# Patient Record
Sex: Male | Born: 1945
Health system: Southern US, Community
[De-identification: ages and names within clinical notes are randomized; demographics above are authoritative.]

## PROBLEM LIST (undated history)

## (undated) DIAGNOSIS — F419 Anxiety disorder, unspecified: Secondary | ICD-10-CM

## (undated) DIAGNOSIS — N189 Chronic kidney disease, unspecified: Secondary | ICD-10-CM

## (undated) DIAGNOSIS — J189 Pneumonia, unspecified organism: Secondary | ICD-10-CM

## (undated) DIAGNOSIS — Z87442 Personal history of urinary calculi: Secondary | ICD-10-CM

## (undated) DIAGNOSIS — C801 Malignant (primary) neoplasm, unspecified: Secondary | ICD-10-CM

## (undated) DIAGNOSIS — I509 Heart failure, unspecified: Secondary | ICD-10-CM

## (undated) DIAGNOSIS — I10 Essential (primary) hypertension: Secondary | ICD-10-CM

## (undated) DIAGNOSIS — J449 Chronic obstructive pulmonary disease, unspecified: Secondary | ICD-10-CM

## (undated) DIAGNOSIS — I499 Cardiac arrhythmia, unspecified: Secondary | ICD-10-CM

## (undated) DIAGNOSIS — N200 Calculus of kidney: Secondary | ICD-10-CM

## (undated) DIAGNOSIS — M1990 Unspecified osteoarthritis, unspecified site: Secondary | ICD-10-CM

## (undated) DIAGNOSIS — IMO0001 Reserved for inherently not codable concepts without codable children: Secondary | ICD-10-CM

## (undated) HISTORY — PX: PROSTATECTOMY: SHX69

---

## 2016-06-27 ENCOUNTER — Emergency Department: Payer: Medicare Other

## 2016-06-27 ENCOUNTER — Encounter: Payer: Self-pay | Admitting: Emergency Medicine

## 2016-06-27 ENCOUNTER — Inpatient Hospital Stay
Admission: EM | Admit: 2016-06-27 | Discharge: 2016-07-04 | DRG: 871 | Disposition: A | Discharge status: Skilled Nursing Facility | Payer: Medicare Other | Attending: Internal Medicine | Admitting: Internal Medicine

## 2016-06-27 DIAGNOSIS — J9601 Acute respiratory failure with hypoxia: Secondary | ICD-10-CM | POA: Diagnosis present

## 2016-06-27 DIAGNOSIS — N136 Pyonephrosis: Secondary | ICD-10-CM | POA: Diagnosis present

## 2016-06-27 DIAGNOSIS — N12 Tubulo-interstitial nephritis, not specified as acute or chronic: Secondary | ICD-10-CM | POA: Diagnosis present

## 2016-06-27 DIAGNOSIS — J440 Chronic obstructive pulmonary disease with acute lower respiratory infection: Secondary | ICD-10-CM | POA: Diagnosis present

## 2016-06-27 DIAGNOSIS — D61818 Other pancytopenia: Secondary | ICD-10-CM | POA: Diagnosis present

## 2016-06-27 DIAGNOSIS — J189 Pneumonia, unspecified organism: Secondary | ICD-10-CM | POA: Diagnosis present

## 2016-06-27 DIAGNOSIS — F329 Major depressive disorder, single episode, unspecified: Secondary | ICD-10-CM | POA: Diagnosis present

## 2016-06-27 DIAGNOSIS — Z8546 Personal history of malignant neoplasm of prostate: Secondary | ICD-10-CM

## 2016-06-27 DIAGNOSIS — R52 Pain, unspecified: Secondary | ICD-10-CM

## 2016-06-27 DIAGNOSIS — Z7951 Long term (current) use of inhaled steroids: Secondary | ICD-10-CM

## 2016-06-27 DIAGNOSIS — N179 Acute kidney failure, unspecified: Secondary | ICD-10-CM | POA: Diagnosis present

## 2016-06-27 DIAGNOSIS — R0602 Shortness of breath: Secondary | ICD-10-CM

## 2016-06-27 DIAGNOSIS — E871 Hypo-osmolality and hyponatremia: Secondary | ICD-10-CM | POA: Diagnosis present

## 2016-06-27 DIAGNOSIS — A4159 Other Gram-negative sepsis: Principal | ICD-10-CM | POA: Diagnosis present

## 2016-06-27 DIAGNOSIS — J441 Chronic obstructive pulmonary disease with (acute) exacerbation: Secondary | ICD-10-CM | POA: Diagnosis present

## 2016-06-27 DIAGNOSIS — R6520 Severe sepsis without septic shock: Secondary | ICD-10-CM | POA: Diagnosis present

## 2016-06-27 DIAGNOSIS — I10 Essential (primary) hypertension: Secondary | ICD-10-CM | POA: Diagnosis present

## 2016-06-27 DIAGNOSIS — Z87891 Personal history of nicotine dependence: Secondary | ICD-10-CM

## 2016-06-27 DIAGNOSIS — A419 Sepsis, unspecified organism: Secondary | ICD-10-CM | POA: Diagnosis present

## 2016-06-27 DIAGNOSIS — N202 Calculus of kidney with calculus of ureter: Secondary | ICD-10-CM | POA: Diagnosis present

## 2016-06-27 DIAGNOSIS — Z79899 Other long term (current) drug therapy: Secondary | ICD-10-CM

## 2016-06-27 DIAGNOSIS — N201 Calculus of ureter: Secondary | ICD-10-CM

## 2016-06-27 DIAGNOSIS — B961 Klebsiella pneumoniae [K. pneumoniae] as the cause of diseases classified elsewhere: Secondary | ICD-10-CM | POA: Diagnosis present

## 2016-06-27 DIAGNOSIS — N390 Urinary tract infection, site not specified: Secondary | ICD-10-CM | POA: Diagnosis present

## 2016-06-27 HISTORY — DX: Malignant (primary) neoplasm, unspecified: C80.1

## 2016-06-27 HISTORY — DX: Essential (primary) hypertension: I10

## 2016-06-27 HISTORY — DX: Chronic obstructive pulmonary disease, unspecified: J44.9

## 2016-06-27 LAB — URINALYSIS COMPLETE WITH MICROSCOPIC (ARMC ONLY)
Bilirubin Urine: NEGATIVE
Glucose, UA: NEGATIVE mg/dL
Ketones, ur: NEGATIVE mg/dL
Nitrite: NEGATIVE
PH: 5 (ref 5.0–8.0)
PROTEIN: 100 mg/dL — AB
SPECIFIC GRAVITY, URINE: 1.017 (ref 1.005–1.030)

## 2016-06-27 LAB — COMPREHENSIVE METABOLIC PANEL
ALBUMIN: 3.2 g/dL — AB (ref 3.5–5.0)
ALK PHOS: 94 U/L (ref 38–126)
ALT: 22 U/L (ref 17–63)
ANION GAP: 12 (ref 5–15)
AST: 50 U/L — ABNORMAL HIGH (ref 15–41)
BILIRUBIN TOTAL: 1 mg/dL (ref 0.3–1.2)
BUN: 40 mg/dL — AB (ref 6–20)
CALCIUM: 8.3 mg/dL — AB (ref 8.9–10.3)
CO2: 21 mmol/L — ABNORMAL LOW (ref 22–32)
CREATININE: 2.47 mg/dL — AB (ref 0.61–1.24)
Chloride: 97 mmol/L — ABNORMAL LOW (ref 101–111)
GFR calc Af Amer: 28 mL/min — ABNORMAL LOW (ref >60)
GFR calc non Af Amer: 24 mL/min — ABNORMAL LOW (ref >60)
GLUCOSE: 115 mg/dL — AB (ref 65–99)
Potassium: 4.1 mmol/L (ref 3.5–5.1)
Sodium: 130 mmol/L — ABNORMAL LOW (ref 135–145)
TOTAL PROTEIN: 6.8 g/dL (ref 6.5–8.1)

## 2016-06-27 LAB — CBC WITH DIFFERENTIAL/PLATELET
BASOS ABS: 0 10*3/uL (ref 0–0.1)
EOS ABS: 0 10*3/uL (ref 0–0.7)
HCT: 38.7 % — ABNORMAL LOW (ref 40.0–52.0)
Hemoglobin: 13.5 g/dL (ref 13.0–18.0)
Lymphocytes Relative: 11 %
Lymphs Abs: 0.2 10*3/uL — ABNORMAL LOW (ref 1.0–3.6)
MCH: 32.4 pg (ref 26.0–34.0)
MCHC: 35 g/dL (ref 32.0–36.0)
MCV: 92.6 fL (ref 80.0–100.0)
MONO ABS: 0 10*3/uL — AB (ref 0.2–1.0)
Monocytes Relative: 1 %
NEUTROS ABS: 2 10*3/uL (ref 1.4–6.5)
Neutrophils Relative %: 88 %
PLATELETS: 86 10*3/uL — AB (ref 150–440)
RBC: 4.18 MIL/uL — ABNORMAL LOW (ref 4.40–5.90)
RDW: 14.4 % (ref 11.5–14.5)
WBC: 2.2 10*3/uL — ABNORMAL LOW (ref 3.8–10.6)

## 2016-06-27 LAB — BRAIN NATRIURETIC PEPTIDE: B NATRIURETIC PEPTIDE 5: 239 pg/mL — AB (ref 0.0–100.0)

## 2016-06-27 MED ORDER — SODIUM CHLORIDE 0.9 % IV BOLUS (SEPSIS)
1000.0000 mL | Freq: Once | INTRAVENOUS | Status: AC
  Administered 2016-06-27: 1000 mL via INTRAVENOUS

## 2016-06-27 MED ORDER — MORPHINE SULFATE (PF) 4 MG/ML IV SOLN
INTRAVENOUS | Status: AC
  Administered 2016-06-27: 4 mg via INTRAVENOUS
  Filled 2016-06-27: qty 1

## 2016-06-27 MED ORDER — ONDANSETRON HCL 4 MG/2ML IJ SOLN
4.0000 mg | Freq: Once | INTRAMUSCULAR | Status: AC
  Administered 2016-06-27: 4 mg via INTRAVENOUS

## 2016-06-27 MED ORDER — SODIUM CHLORIDE 0.9 % IV BOLUS (SEPSIS)
1000.0000 mL | Freq: Once | INTRAVENOUS | Status: AC
Start: 2016-06-28 — End: 2016-06-28
  Administered 2016-06-27: 1000 mL via INTRAVENOUS

## 2016-06-27 MED ORDER — PIPERACILLIN-TAZOBACTAM 3.375 G IVPB 30 MIN
3.3750 g | Freq: Once | INTRAVENOUS | Status: AC
  Administered 2016-06-27: 3.375 g via INTRAVENOUS
  Filled 2016-06-27: qty 50

## 2016-06-27 MED ORDER — MORPHINE SULFATE (PF) 4 MG/ML IV SOLN
4.0000 mg | Freq: Once | INTRAVENOUS | Status: AC
  Administered 2016-06-27: 4 mg via INTRAVENOUS

## 2016-06-27 MED ORDER — ONDANSETRON HCL 4 MG/2ML IJ SOLN
INTRAMUSCULAR | Status: AC
  Administered 2016-06-27: 4 mg via INTRAVENOUS
  Filled 2016-06-27: qty 2

## 2016-06-27 NOTE — ED Triage Notes (Signed)
Pt to ED via EMS from home c/o respiratory distress that started several days ago and worsened today.  EMS states 75% RA upon arrival, wheezing throughout lungs, gave '10mg'$  albuterol, 1 duoneb, '125mg'$  solumedrol, then placed on 2L [place] with 96% oxygen saturation.  Pt has hx of COPD.  States recently dx with kidney stone and placed on flomax, IBU, and tylenol.  Pt presents with rhonchi throughout, tachypnic, labored breathing, pale, and clammy skin, A&Ox4, speaking in complete and coherent sentences.

## 2016-06-27 NOTE — ED Notes (Addendum)
MD Malinda at bedside. 

## 2016-06-27 NOTE — ED Notes (Signed)
Pt placed on bi-pap by respiratory.  Pt reports easier time breathing.

## 2016-06-27 NOTE — ED Provider Notes (Signed)
[facility] Emergency Department Provider Note   ____________________________________________   First MD Initiated Contact with Patient 06/27/16 2120     (approximate)  I have reviewed the triage vital signs and the nursing notes.   HISTORY  Chief Complaint Respiratory Distress    HPI Edwin Donovan. is a 70 y.o. male patient reports she has a kidney stone and was seen for this and started on Flomax treatment. He has become very short of breath got very sweaty shaky and felt a little woozy and very weak. This seems to develop rapidly today.   Past Medical History:  Diagnosis Date  . COPD (chronic obstructive pulmonary disease) ([place])   . Hypertension    Past medical history kidney stone There are no active problems to display for this patient.   Past Surgical History:  Procedure Laterality Date  . PROSTATECTOMY      Prior to Admission medications   Medication Sig Start Date End Date Taking? Authorizing Provider  albuterol (PROVENTIL HFA;VENTOLIN HFA) 108 (90 Base) MCG/ACT inhaler Inhale 1-2 puffs into the lungs every 6 (six) hours as needed for wheezing or shortness of breath.   Yes Historical Provider, MD  amLODipine (NORVASC) 10 MG tablet Take 10 mg by mouth daily.   Yes Historical Provider, MD  budesonide-formoterol (SYMBICORT) 160-4.5 MCG/ACT inhaler Inhale 2 puffs into the lungs 2 (two) times daily.   Yes Historical Provider, MD  FLUoxetine (PROZAC) 20 MG capsule Take 40 mg by mouth daily.   Yes Historical Provider, MD  hydrochlorothiazide (HYDRODIURIL) 25 MG tablet Take 25 mg by mouth daily.   Yes Historical Provider, MD  ibuprofen (ADVIL,MOTRIN) 600 MG tablet Take 600 mg by mouth every 6 (six) hours as needed for mild pain or moderate pain.   Yes Historical Provider, MD  tamsulosin (FLOMAX) 0.4 MG CAPS capsule Take 0.4 mg by mouth daily.   Yes Historical Provider, MD    Allergies Review of patient's allergies indicates no  known allergies.  History reviewed. No pertinent family history.  Social History Social History  Substance Use Topics  . Smoking status: Former Research scientist (life sciences)  . Smokeless tobacco: Never Used  . Alcohol use No    Review of Systems Constitutional: chillsAnd sweats Eyes: No visual changes. ENT: No sore throat. Cardiovascular: Denies chest pain. Respiratory: shortness of breath. Gastrointestinal: No abdominal pain.  No nausea, no vomiting.  No diarrhea.  No constipation. Genitourinary: Negative for dysuria. Musculoskeletal: Negative for back pain. Skin: Negative for rash. Neurological: Negative for headaches, focal weakness or numbness.  10-point ROS otherwise negative.  ____________________________________________   PHYSICAL EXAM:  VITAL SIGNS: ED Triage Vitals [06/27/16 2115]  Enc Vitals Group     BP 114/70     Pulse Rate (!) 132     Resp (!) 38     Temp 98.5 F (36.9 C)     Temp Source Oral     SpO2 92 %     Weight      Height      Head Circumference      Peak Flow      Pain Score      Pain Loc      Pain Edu?      Excl. in [place]?     Constitutional: Alert and oriented. Breathing hard Eyes: Conjunctivae are normal. PERRL. EOMI. Head: Atraumatic. Nose: No congestion/rhinnorhea. Mouth/Throat: Mucous membranes are moist.  Oropharynx non-erythematous. Neck: No stridor.  Cardiovascular: Increased rate, regular rhythm. Grossly normal heart sounds.  Good peripheral circulation. Respiratory: Increased respiratory effort.  No retractions. Lungs CTAB. Gastrointestinal: Soft and nontender. No distention. No abdominal bruits. No CVA tenderness. Musculoskeletal: No lower extremity tenderness nor edema.  No joint effusions. Neurologic:  Normal speech and language. No gross focal neurologic deficits are appreciated. No gait instability. Skin:  Skin is warm, dry and intact. No rash noted. Psychiatric: Mood and affect are normal. Speech and behavior are  normal.  ____________________________________________   LABS (all labs ordered are listed, but only abnormal results are displayed)  Labs Reviewed  COMPREHENSIVE METABOLIC PANEL - Abnormal; Notable for the following:       Result Value   Sodium 130 (*)    Chloride 97 (*)    CO2 21 (*)    Glucose, Bld 115 (*)    BUN 40 (*)    Creatinine, Ser 2.47 (*)    Calcium 8.3 (*)    Albumin 3.2 (*)    AST 50 (*)    GFR calc non Af Amer 24 (*)    GFR calc Af Amer 28 (*)    All other components within normal limits  CBC WITH DIFFERENTIAL/PLATELET - Abnormal; Notable for the following:    WBC 2.2 (*)    RBC 4.18 (*)    HCT 38.7 (*)    Platelets 86 (*)    Lymphs Abs 0.2 (*)    Monocytes Absolute 0.0 (*)    All other components within normal limits  BLOOD GAS, ARTERIAL - Abnormal; Notable for the following:    pH, Arterial 7.49 (*)    pCO2 arterial 27 (*)    pO2, Arterial 129 (*)    Bicarbonate 20.6 (*)    All other components within normal limits  URINALYSIS COMPLETEWITH MICROSCOPIC ([facility] ONLY) - Abnormal; Notable for the following:    Color, Urine AMBER (*)    APPearance CLOUDY (*)    Hgb urine dipstick 3+ (*)    Protein, ur 100 (*)    Leukocytes, UA TRACE (*)    Bacteria, UA MANY (*)    Squamous Epithelial / LPF 0-5 (*)    All other components within normal limits  BRAIN NATRIURETIC PEPTIDE - Abnormal; Notable for the following:    B Natriuretic Peptide 239.0 (*)    All other components within normal limits  CULTURE, BLOOD (ROUTINE X 2)  CULTURE, BLOOD (ROUTINE X 2)  URINE CULTURE  LACTIC ACID, PLASMA  LACTIC ACID, PLASMA   ____________________________________________  EKG  EKG read and interpreted by me shows sinus tachycardia rate of 1:30 normal axis no acute ST-T wave changes ____________________________________________  RADIOLOGY  EXAM: CT ABDOMEN AND PELVIS WITHOUT CONTRAST  TECHNIQUE: Multidetector CT imaging of the abdomen and pelvis was  performed following the standard protocol without IV contrast.  COMPARISON:  None.  FINDINGS: Please note that parenchymal abnormalities may be missed without intravenous contrast.  Lower chest:  Coronary artery calcifications noted.  Hepatobiliary: The liver and gallbladder are unremarkable. There is no evidence of biliary dilatation.  Pancreas: Unremarkable  Spleen: Unremarkable  Adrenals/Urinary Tract: A 6 mm right UVJ calculus causes moderate right hydroureteronephrosis and perinephric inflammation. A 2 mm nonobstructing distal right ureteral calculus identified approximately with 1 cm above the UVJ. Punctate nonobstructing bilateral renal calculi are identified. A nonobstructing 5 mm posteriorly right renal calculus noted. Left renal cysts are present. The adrenal glands are unremarkable.  Stomach/Bowel: Colonic diverticulosis noted without diverticulitis. There is no evidence of bowel obstruction or definite bowel wall thickening. The appendix  is normal.  Vascular/Lymphatic: Abdominal aortic atherosclerotic calcifications noted. The distal aorta measures 3.1 cm in greatest diameter. No enlarged lymph nodes identified.  Reproductive: Prostatectomy changes identified.  Other: No free fluid, focal collection or pneumoperitoneum.  Musculoskeletal: No acute or suspicious abnormalities. Degenerative changes within the lumbar spine noted.  IMPRESSION: 6 mm right UVJ calculus causing moderate right hydroureteronephrosis.  2 mm nonobstructing distal right ureteral calculus, approximately 1 cm above the right UVJ.  Bilateral nonobstructing renal calculi.  3.1 cm distal abdominal aortic aneurysm. Recommend followup by ultrasound in 3 years. This recommendation follows ACR consensus guidelines: White Paper of the ACR Incidental Findings Committee II on Vascular Findings. J Am Coll Radiol 2013; 71:062-694  Abdominal aortic atherosclerosis.  Coronary  artery disease.   Electronically Signed   By: Margarette Canada M.D.   On: 06/27/2016 22:42  PORTABLE CHEST 1 VIEW  COMPARISON:  None.  FINDINGS: The heart size and mediastinal contours are within normal limits. Both lungs are clear. The visualized skeletal structures are unremarkable.  IMPRESSION: No active disease.   Electronically Signed   By: Fidela Salisbury M.D.   On: 06/27/2016 22:06  ____________________________________________   PROCEDURES  Procedure(s) performed Procedures  Critical Care performed: CRITICAL CARE Performed by: Nena Polio   Total critical care time: 45 minutes  Critical care time was exclusive of separately billable procedures and treating other patients.  Critical care was necessary to treat or prevent imminent or life-threatening deterioration.  Critical care was time spent personally by me on the following activities: development of treatment plan with patient and/or surrogate as well as nursing, discussions with consultants, evaluation of patient's response to treatment, examination of patient, obtaining history from patient or surrogate, ordering and performing treatments and interventions, ordering and review of laboratory studies, ordering and review of radiographic studies, pulse oximetry and re-evaluation of patient's condition.   ____________________________________________   INITIAL IMPRESSION / ASSESSMENT AND PLAN / ED COURSE  Pertinent labs & imaging results that were available during my care of the patient were reviewed by me and considered in my medical decision making (see chart for details).    Clinical Course   Discussed patient with urology who does not feel the hypoxia would cause his problems does not think he is sepsis septic from his stone and will get back to [place] if we asked them to again.  [facility]  I'm not sure why he was hypoxic he has no chest pain. I cannot  CT him because of his creatinine being so high at this point. __________________________________________   FINAL CLINICAL IMPRESSION(S) / ED DIAGNOSES  Final diagnoses:  Pain  Sepsis, due to unspecified organism [facility])   Other diagnosis hypoxia Now hypotension   NEW MEDICATIONS STARTED DURING THIS VISIT:  New Prescriptions   No medications on file     Note:  This document was prepared using Dragon voice recognition software and may include unintentional dictation errors.    Nena Polio, MD 06/28/16 [phone]

## 2016-06-27 NOTE — ED Notes (Signed)
MD [facility] informed of pt's BP

## 2016-06-28 ENCOUNTER — Encounter
Admission: EM | Disposition: A | Discharge status: Skilled Nursing Facility | Payer: Self-pay | Source: Home / Self Care | Attending: Internal Medicine

## 2016-06-28 ENCOUNTER — Inpatient Hospital Stay: Payer: Medicare Other | Admitting: Anesthesiology

## 2016-06-28 ENCOUNTER — Encounter: Payer: Self-pay | Admitting: Internal Medicine

## 2016-06-28 DIAGNOSIS — R079 Chest pain, unspecified: Secondary | ICD-10-CM | POA: Diagnosis not present

## 2016-06-28 DIAGNOSIS — I10 Essential (primary) hypertension: Secondary | ICD-10-CM | POA: Diagnosis present

## 2016-06-28 DIAGNOSIS — D61818 Other pancytopenia: Secondary | ICD-10-CM | POA: Diagnosis present

## 2016-06-28 DIAGNOSIS — J189 Pneumonia, unspecified organism: Secondary | ICD-10-CM | POA: Diagnosis present

## 2016-06-28 DIAGNOSIS — A4159 Other Gram-negative sepsis: Secondary | ICD-10-CM | POA: Diagnosis present

## 2016-06-28 DIAGNOSIS — N136 Pyonephrosis: Secondary | ICD-10-CM | POA: Diagnosis present

## 2016-06-28 DIAGNOSIS — J440 Chronic obstructive pulmonary disease with acute lower respiratory infection: Secondary | ICD-10-CM | POA: Diagnosis present

## 2016-06-28 DIAGNOSIS — N179 Acute kidney failure, unspecified: Secondary | ICD-10-CM | POA: Diagnosis present

## 2016-06-28 DIAGNOSIS — A419 Sepsis, unspecified organism: Secondary | ICD-10-CM | POA: Diagnosis present

## 2016-06-28 DIAGNOSIS — R52 Pain, unspecified: Secondary | ICD-10-CM | POA: Diagnosis present

## 2016-06-28 DIAGNOSIS — J441 Chronic obstructive pulmonary disease with (acute) exacerbation: Secondary | ICD-10-CM | POA: Diagnosis present

## 2016-06-28 DIAGNOSIS — B961 Klebsiella pneumoniae [K. pneumoniae] as the cause of diseases classified elsewhere: Secondary | ICD-10-CM | POA: Diagnosis present

## 2016-06-28 DIAGNOSIS — Z7951 Long term (current) use of inhaled steroids: Secondary | ICD-10-CM | POA: Diagnosis not present

## 2016-06-28 DIAGNOSIS — J9601 Acute respiratory failure with hypoxia: Secondary | ICD-10-CM | POA: Diagnosis present

## 2016-06-28 DIAGNOSIS — Z87891 Personal history of nicotine dependence: Secondary | ICD-10-CM | POA: Diagnosis not present

## 2016-06-28 DIAGNOSIS — R6520 Severe sepsis without septic shock: Secondary | ICD-10-CM | POA: Diagnosis present

## 2016-06-28 DIAGNOSIS — N12 Tubulo-interstitial nephritis, not specified as acute or chronic: Secondary | ICD-10-CM | POA: Diagnosis present

## 2016-06-28 DIAGNOSIS — N1330 Unspecified hydronephrosis: Secondary | ICD-10-CM

## 2016-06-28 DIAGNOSIS — N201 Calculus of ureter: Secondary | ICD-10-CM

## 2016-06-28 DIAGNOSIS — Z79899 Other long term (current) drug therapy: Secondary | ICD-10-CM | POA: Diagnosis not present

## 2016-06-28 DIAGNOSIS — N390 Urinary tract infection, site not specified: Secondary | ICD-10-CM | POA: Diagnosis present

## 2016-06-28 DIAGNOSIS — N202 Calculus of kidney with calculus of ureter: Secondary | ICD-10-CM | POA: Diagnosis present

## 2016-06-28 DIAGNOSIS — F329 Major depressive disorder, single episode, unspecified: Secondary | ICD-10-CM | POA: Diagnosis present

## 2016-06-28 DIAGNOSIS — E871 Hypo-osmolality and hyponatremia: Secondary | ICD-10-CM | POA: Diagnosis present

## 2016-06-28 DIAGNOSIS — Z8546 Personal history of malignant neoplasm of prostate: Secondary | ICD-10-CM | POA: Diagnosis not present

## 2016-06-28 HISTORY — PX: CYSTOSCOPY WITH STENT PLACEMENT: SHX5790

## 2016-06-28 LAB — BLOOD CULTURE ID PANEL (REFLEXED)
ACINETOBACTER BAUMANNII: NOT DETECTED
CANDIDA GLABRATA: NOT DETECTED
CANDIDA KRUSEI: NOT DETECTED
Candida albicans: NOT DETECTED
Candida parapsilosis: NOT DETECTED
Candida tropicalis: NOT DETECTED
Carbapenem resistance: NOT DETECTED
ENTEROBACTER CLOACAE COMPLEX: NOT DETECTED
ENTEROBACTERIACEAE SPECIES: DETECTED — AB
ESCHERICHIA COLI: NOT DETECTED
Enterococcus species: NOT DETECTED
HAEMOPHILUS INFLUENZAE: NOT DETECTED
KLEBSIELLA OXYTOCA: NOT DETECTED
Klebsiella pneumoniae: DETECTED — AB
LISTERIA MONOCYTOGENES: NOT DETECTED
METHICILLIN RESISTANCE: NOT DETECTED
NEISSERIA MENINGITIDIS: NOT DETECTED
PSEUDOMONAS AERUGINOSA: NOT DETECTED
Proteus species: NOT DETECTED
SERRATIA MARCESCENS: NOT DETECTED
STREPTOCOCCUS AGALACTIAE: NOT DETECTED
STREPTOCOCCUS PYOGENES: NOT DETECTED
STREPTOCOCCUS SPECIES: NOT DETECTED
Staphylococcus aureus (BCID): NOT DETECTED
Staphylococcus species: NOT DETECTED
Streptococcus pneumoniae: NOT DETECTED
Vancomycin resistance: NOT DETECTED

## 2016-06-28 LAB — MRSA PCR SCREENING: MRSA by PCR: NEGATIVE

## 2016-06-28 LAB — BLOOD GAS, ARTERIAL
ACID-BASE DEFICIT: 1.4 mmol/L (ref 0.0–2.0)
BICARBONATE: 20.6 meq/L — AB (ref 21.0–28.0)
EXPIRATORY PAP: 6
FIO2: 0.3
Inspiratory PAP: 16
O2 SAT: 99.2 %
PATIENT TEMPERATURE: 37
PCO2 ART: 27 mmHg — AB (ref 32.0–48.0)
PH ART: 7.49 — AB (ref 7.350–7.450)
RATE: 8 resp/min
pO2, Arterial: 129 mmHg — ABNORMAL HIGH (ref 83.0–108.0)

## 2016-06-28 LAB — HEMOGLOBIN A1C: Hgb A1c MFr Bld: 6 % (ref 4.0–6.0)

## 2016-06-28 LAB — LACTIC ACID, PLASMA
LACTIC ACID, VENOUS: 2.2 mmol/L — AB (ref 0.5–1.9)
Lactic Acid, Venous: 1.6 mmol/L (ref 0.5–1.9)

## 2016-06-28 LAB — TSH: TSH: 3.648 u[IU]/mL (ref 0.350–4.500)

## 2016-06-28 LAB — TROPONIN I: Troponin I: 0.06 ng/mL (ref <0.03)

## 2016-06-28 LAB — GLUCOSE, CAPILLARY: Glucose-Capillary: 199 mg/dL — ABNORMAL HIGH (ref 65–99)

## 2016-06-28 SURGERY — CYSTOSCOPY, WITH STENT INSERTION
Anesthesia: General | Site: Ureter | Laterality: Right | Wound class: Clean Contaminated

## 2016-06-28 MED ORDER — SODIUM CHLORIDE 0.9 % IV SOLN
INTRAVENOUS | Status: DC | PRN
  Administered 2016-06-28: 12:00:00 via INTRAVENOUS

## 2016-06-28 MED ORDER — TAMSULOSIN HCL 0.4 MG PO CAPS
0.4000 mg | ORAL_CAPSULE | Freq: Every day | ORAL | Status: DC
  Administered 2016-06-28 – 2016-07-04 (×7): 0.4 mg via ORAL
  Filled 2016-06-28 (×7): qty 1

## 2016-06-28 MED ORDER — OXYCODONE HCL 5 MG PO TABS
5.0000 mg | ORAL_TABLET | ORAL | Status: DC | PRN
  Administered 2016-06-28 – 2016-07-01 (×7): 5 mg via ORAL
  Filled 2016-06-28 (×8): qty 1

## 2016-06-28 MED ORDER — DEXTROSE 5 % IV SOLN
2.0000 g | INTRAVENOUS | Status: DC
  Administered 2016-06-28 – 2016-07-03 (×6): 2 g via INTRAVENOUS
  Filled 2016-06-28 (×7): qty 2

## 2016-06-28 MED ORDER — VANCOMYCIN HCL 10 G IV SOLR
1250.0000 mg | Freq: Once | INTRAVENOUS | Status: DC
  Filled 2016-06-28: qty 1250

## 2016-06-28 MED ORDER — SUGAMMADEX SODIUM 500 MG/5ML IV SOLN
INTRAVENOUS | Status: DC | PRN
  Administered 2016-06-28: 270 mg via INTRAVENOUS

## 2016-06-28 MED ORDER — ROCURONIUM BROMIDE 100 MG/10ML IV SOLN
INTRAVENOUS | Status: DC | PRN
  Administered 2016-06-28: 10 mg via INTRAVENOUS

## 2016-06-28 MED ORDER — HEPARIN SODIUM (PORCINE) 5000 UNIT/ML IJ SOLN
5000.0000 [IU] | Freq: Three times a day (TID) | INTRAMUSCULAR | Status: DC
  Administered 2016-06-28 – 2016-06-30 (×6): 5000 [IU] via SUBCUTANEOUS
  Filled 2016-06-28 (×6): qty 1

## 2016-06-28 MED ORDER — ONDANSETRON HCL 4 MG PO TABS: 4.0000 mg | ORAL_TABLET | Freq: Four times a day (QID) | ORAL | Status: DC | PRN

## 2016-06-28 MED ORDER — SODIUM CHLORIDE 0.9% FLUSH
3.0000 mL | Freq: Two times a day (BID) | INTRAVENOUS | Status: DC
  Administered 2016-06-28 – 2016-07-04 (×12): 3 mL via INTRAVENOUS

## 2016-06-28 MED ORDER — FLUOXETINE HCL 20 MG PO CAPS
40.0000 mg | ORAL_CAPSULE | Freq: Every day | ORAL | Status: DC
Start: 2016-06-28 — End: 2016-07-04
  Administered 2016-06-28 – 2016-07-04 (×6): 40 mg via ORAL
  Filled 2016-06-28 (×7): qty 2

## 2016-06-28 MED ORDER — IPRATROPIUM-ALBUTEROL 0.5-2.5 (3) MG/3ML IN SOLN
RESPIRATORY_TRACT | Status: AC
  Filled 2016-06-28: qty 3

## 2016-06-28 MED ORDER — FENTANYL CITRATE (PF) 100 MCG/2ML IJ SOLN: 25.0000 ug | INTRAMUSCULAR | Status: DC | PRN

## 2016-06-28 MED ORDER — ONDANSETRON HCL 4 MG/2ML IJ SOLN
INTRAMUSCULAR | Status: DC | PRN
  Administered 2016-06-28: 4 mg via INTRAVENOUS

## 2016-06-28 MED ORDER — MOMETASONE FURO-FORMOTEROL FUM 200-5 MCG/ACT IN AERO
2.0000 | INHALATION_SPRAY | Freq: Two times a day (BID) | RESPIRATORY_TRACT | Status: DC
  Administered 2016-06-28 – 2016-07-04 (×12): 2 via RESPIRATORY_TRACT
  Filled 2016-06-28: qty 8.8

## 2016-06-28 MED ORDER — OXYCODONE HCL 5 MG/5ML PO SOLN: 5.0000 mg | Freq: Once | ORAL | Status: DC | PRN

## 2016-06-28 MED ORDER — SUCCINYLCHOLINE CHLORIDE 20 MG/ML IJ SOLN
INTRAMUSCULAR | Status: DC | PRN
  Administered 2016-06-28: 140 mg via INTRAVENOUS

## 2016-06-28 MED ORDER — VASOPRESSIN 20 UNIT/ML IV SOLN
INTRAVENOUS | Status: DC | PRN
  Administered 2016-06-28: 2 [IU] via INTRAVENOUS
  Administered 2016-06-28: 1 [IU] via INTRAVENOUS

## 2016-06-28 MED ORDER — VANCOMYCIN HCL IN DEXTROSE 1-5 GM/200ML-% IV SOLN
INTRAVENOUS | Status: AC
  Filled 2016-06-28: qty 200

## 2016-06-28 MED ORDER — OXYCODONE HCL 5 MG PO TABS: 5.0000 mg | ORAL_TABLET | Freq: Once | ORAL | Status: DC | PRN

## 2016-06-28 MED ORDER — PHENYLEPHRINE HCL 10 MG/ML IJ SOLN
INTRAMUSCULAR | Status: DC | PRN
  Administered 2016-06-28: 200 ug via INTRAVENOUS
  Administered 2016-06-28 (×2): 100 ug via INTRAVENOUS
  Administered 2016-06-28: 200 ug via INTRAVENOUS
  Administered 2016-06-28: 150 ug via INTRAVENOUS

## 2016-06-28 MED ORDER — IPRATROPIUM-ALBUTEROL 0.5-2.5 (3) MG/3ML IN SOLN
3.0000 mL | Freq: Once | RESPIRATORY_TRACT | Status: DC | PRN
  Administered 2016-06-28: 3 mL via RESPIRATORY_TRACT

## 2016-06-28 MED ORDER — ACETAMINOPHEN 325 MG PO TABS
650.0000 mg | ORAL_TABLET | Freq: Four times a day (QID) | ORAL | Status: DC | PRN
  Administered 2016-07-02: 650 mg via ORAL
  Filled 2016-06-28: qty 2

## 2016-06-28 MED ORDER — MORPHINE SULFATE (PF) 2 MG/ML IV SOLN
INTRAVENOUS | Status: AC
  Filled 2016-06-28: qty 1

## 2016-06-28 MED ORDER — VANCOMYCIN HCL IN DEXTROSE 1-5 GM/200ML-% IV SOLN
1000.0000 mg | Freq: Once | INTRAVENOUS | Status: AC
  Administered 2016-06-28: 1000 mg via INTRAVENOUS

## 2016-06-28 MED ORDER — PROPOFOL 10 MG/ML IV BOLUS
INTRAVENOUS | Status: DC | PRN
  Administered 2016-06-28: 180 mg via INTRAVENOUS

## 2016-06-28 MED ORDER — FENTANYL CITRATE (PF) 100 MCG/2ML IJ SOLN
INTRAMUSCULAR | Status: DC | PRN
  Administered 2016-06-28: 100 ug via INTRAVENOUS

## 2016-06-28 MED ORDER — MORPHINE SULFATE (PF) 2 MG/ML IV SOLN
1.0000 mg | INTRAVENOUS | Status: DC | PRN
  Administered 2016-06-29: 1 mg via INTRAVENOUS
  Filled 2016-06-28: qty 1

## 2016-06-28 MED ORDER — DOCUSATE SODIUM 100 MG PO CAPS
100.0000 mg | ORAL_CAPSULE | Freq: Two times a day (BID) | ORAL | Status: DC
  Administered 2016-06-28 – 2016-07-04 (×9): 100 mg via ORAL
  Filled 2016-06-28 (×13): qty 1

## 2016-06-28 MED ORDER — ACETAMINOPHEN 650 MG RE SUPP: 650.0000 mg | Freq: Four times a day (QID) | RECTAL | Status: DC | PRN

## 2016-06-28 MED ORDER — SODIUM CHLORIDE 0.9 % IV SOLN
INTRAVENOUS | Status: DC
  Administered 2016-06-28 – 2016-06-30 (×5): via INTRAVENOUS

## 2016-06-28 MED ORDER — ALBUTEROL SULFATE (2.5 MG/3ML) 0.083% IN NEBU
3.0000 mL | INHALATION_SOLUTION | Freq: Four times a day (QID) | RESPIRATORY_TRACT | Status: DC | PRN
  Administered 2016-06-29: 3 mL via RESPIRATORY_TRACT
  Filled 2016-06-28: qty 3

## 2016-06-28 MED ORDER — MORPHINE SULFATE (PF) 4 MG/ML IV SOLN: 4.0000 mg | INTRAVENOUS | Status: DC | PRN

## 2016-06-28 MED ORDER — ONDANSETRON HCL 4 MG/2ML IJ SOLN
4.0000 mg | Freq: Four times a day (QID) | INTRAMUSCULAR | Status: DC | PRN
  Administered 2016-07-01: 4 mg via INTRAVENOUS
  Filled 2016-06-28: qty 2

## 2016-06-28 SURGICAL SUPPLY — 21 items
BAG DRAIN CYSTO-URO LG1000N (MISCELLANEOUS) ×3 IMPLANT
CATH FOL 2WAY LX 16X5 (CATHETERS) IMPLANT
CATH URETL 5X70 OPEN END (CATHETERS) ×3 IMPLANT
CONRAY 43 FOR UROLOGY 50M (MISCELLANEOUS) ×3 IMPLANT
GLOVE BIO SURGEON STRL SZ 6.5 (GLOVE) ×2 IMPLANT
GLOVE BIO SURGEONS STRL SZ 6.5 (GLOVE) ×1
GOWN STRL REUS W/ TWL LRG LVL4 (GOWN DISPOSABLE) ×2 IMPLANT
GOWN STRL REUS W/TWL LRG LVL4 (GOWN DISPOSABLE) ×4
HOLDER FOLEY CATH W/STRAP (MISCELLANEOUS) IMPLANT
KIT RM TURNOVER CYSTO AR (KITS) ×3 IMPLANT
PACK CYSTO AR (MISCELLANEOUS) ×3 IMPLANT
SENSORWIRE 0.038 NOT ANGLED (WIRE) ×3
SET CYSTO W/LG BORE CLAMP LF (SET/KITS/TRAYS/PACK) ×3 IMPLANT
SOL .9 NS 3000ML IRR  AL (IV SOLUTION) ×2
SOL .9 NS 3000ML IRR UROMATIC (IV SOLUTION) ×1 IMPLANT
STENT URET 6FRX24 CONTOUR (STENTS) IMPLANT
STENT URET 6FRX26 CONTOUR (STENTS) ×3 IMPLANT
SURGILUBE 2OZ TUBE FLIPTOP (MISCELLANEOUS) ×3 IMPLANT
SYRINGE IRR TOOMEY STRL 70CC (SYRINGE) ×3 IMPLANT
WATER STERILE IRR 1000ML POUR (IV SOLUTION) ×3 IMPLANT
WIRE SENSOR 0.038 NOT ANGLED (WIRE) ×1 IMPLANT

## 2016-06-28 NOTE — Progress Notes (Addendum)
[employer] PROGRESS NOTE  Edwin Donovan. XLK:[id number] DOB: [birth year] DOA: 06/27/2016 PCP: [place]  HPI/Subjective: Patient feeling much better than he did earlier. Still having some pain in his groin. Admitted with sepsis tachycardia and tachypnea  Objective: Vitals:   06/28/16 1500 06/28/16 1600  BP: 95/70 101/76  Pulse: 87 92  Resp: 16 17  Temp:      Filed Weights   06/27/16 2129 06/28/16 0339 06/28/16 1147  Weight: 129.3 kg (285 lb) 135.4 kg (298 lb 8.1 oz) 135.2 kg (298 lb)    ROS: Review of Systems  Constitutional: Negative for chills and fever.  Eyes: Negative for blurred vision.  Respiratory: Positive for shortness of breath. Negative for cough.   Cardiovascular: Negative for chest pain.  Gastrointestinal: Positive for abdominal pain. Negative for constipation, diarrhea, nausea and vomiting.  Genitourinary: Negative for dysuria.  Musculoskeletal: Negative for joint pain.  Neurological: Negative for dizziness and headaches.   Exam: Physical Exam  Constitutional: He is oriented to person, place, and time.  HENT:  Nose: No mucosal edema.  Mouth/Throat: No oropharyngeal exudate or posterior oropharyngeal edema.  Eyes: Conjunctivae, EOM and lids are normal. Pupils are equal, round, and reactive to light.  Neck: No JVD present. Carotid bruit is not present. No edema present. No thyroid mass and no thyromegaly present.  Cardiovascular: S1 normal and S2 normal.  Exam reveals no gallop.   No murmur heard. Pulses:      Dorsalis pedis pulses are 2+ on the right side, and 2+ on the left side.  Respiratory: No respiratory distress. He has no wheezes. He has no rhonchi. He has rales in the right lower field and the left lower field.  GI: Soft. Bowel sounds are normal. There is tenderness in the right lower quadrant.  Musculoskeletal:       Right ankle: He exhibits swelling.       Left ankle: He exhibits swelling.  Lymphadenopathy:    He  has no cervical adenopathy.  Neurological: He is alert and oriented to person, place, and time. No cranial nerve deficit.  Skin: Skin is warm. No rash noted. Nails show no clubbing.  Psychiatric: He has a normal mood and affect.      Data Reviewed: Basic Metabolic Panel:  Recent Labs Lab 06/27/16 2122  NA 130*  K 4.1  CL 97*  CO2 21*  GLUCOSE 115*  BUN 40*  CREATININE 2.47*  CALCIUM 8.3*   Liver Function Tests:  Recent Labs Lab 06/27/16 2122  AST 50*  ALT 22  ALKPHOS 94  BILITOT 1.0  PROT 6.8  ALBUMIN 3.2*   CBC:  Recent Labs Lab 06/27/16 2122  WBC 2.2*  NEUTROABS 2.0  HGB 13.5  HCT 38.7*  MCV 92.6  PLT 86*   Cardiac Enzymes:  Recent Labs Lab 06/28/16 0245  TROPONINI 0.06*   BNP (last 3 results)  Recent Labs  06/27/16 2122  BNP 239.0*     CBG:  Recent Labs Lab 06/28/16 0316  GLUCAP 199*    Recent Results (from the past 240 hour(s))  Blood Culture (routine x 2)     Status: None (Preliminary result)   Collection Time: 06/27/16  9:22 PM  Result Value Ref Range Status   Specimen Description   Final    BLOOD RIGHT ANTECUBITAL Performed at [facility]  Final   Culture  Setup Time   Final  GRAM NEGATIVE RODS IN BOTH AEROBIC AND ANAEROBIC BOTTLES CRITICAL VALUE NOTED.  VALUE IS CONSISTENT WITH PREVIOUSLY REPORTED AND CALLED VALUE.    Culture GRAM NEGATIVE RODS  Final   Report Status PENDING  Incomplete  Blood Culture (routine x 2)     Status: None (Preliminary result)   Collection Time: 06/27/16  9:22 PM  Result Value Ref Range Status   Specimen Description BLOOD LEFT WRIST  Final   Special Requests BOTTLES DRAWN AEROBIC AND ANAEROBIC [id number],4CCANA  Final   Culture  Setup Time   Final    GRAM NEGATIVE RODS IN BOTH AEROBIC AND ANAEROBIC BOTTLES CRITICAL RESULT CALLED TO, READ BACK BY AND VERIFIED WITH: TELDRIN JAMES ON 06/28/16 AT 4431 QSD Organism ID  to follow    Culture GRAM NEGATIVE RODS  Final   Report Status PENDING  Incomplete  Blood Culture ID Panel (Reflexed)     Status: Abnormal   Collection Time: 06/27/16  9:22 PM  Result Value Ref Range Status   Enterococcus species NOT DETECTED NOT DETECTED Final   Vancomycin resistance NOT DETECTED NOT DETECTED Final   Listeria monocytogenes NOT DETECTED NOT DETECTED Final   Staphylococcus species NOT DETECTED NOT DETECTED Final   Staphylococcus aureus NOT DETECTED NOT DETECTED Final   Methicillin resistance NOT DETECTED NOT DETECTED Final   Streptococcus species NOT DETECTED NOT DETECTED Final   Streptococcus agalactiae NOT DETECTED NOT DETECTED Final   Streptococcus pneumoniae NOT DETECTED NOT DETECTED Final   Streptococcus pyogenes NOT DETECTED NOT DETECTED Final   Acinetobacter baumannii NOT DETECTED NOT DETECTED Final   Enterobacteriaceae species DETECTED (A) NOT DETECTED Final    Comment: CRITICAL RESULT CALLED TO, READ BACK BY AND VERIFIED WITH: TELDRIN JAMES ON 06/28/16 AT 0926 QSD    Enterobacter cloacae complex NOT DETECTED NOT DETECTED Final   Escherichia coli NOT DETECTED NOT DETECTED Final   Klebsiella oxytoca NOT DETECTED NOT DETECTED Final   Klebsiella pneumoniae DETECTED (A) NOT DETECTED Final    Comment: CRITICAL RESULT CALLED TO, READ BACK BY AND VERIFIED WITH: TELDRIN JAMES ON 06/28/16 AT 0926 QSD    Proteus species NOT DETECTED NOT DETECTED Final   Serratia marcescens NOT DETECTED NOT DETECTED Final   Carbapenem resistance NOT DETECTED NOT DETECTED Final   Haemophilus influenzae NOT DETECTED NOT DETECTED Final   Neisseria meningitidis NOT DETECTED NOT DETECTED Final   Pseudomonas aeruginosa NOT DETECTED NOT DETECTED Final   Candida albicans NOT DETECTED NOT DETECTED Final   Candida glabrata NOT DETECTED NOT DETECTED Final   Candida krusei NOT DETECTED NOT DETECTED Final   Candida parapsilosis NOT DETECTED NOT DETECTED Final   Candida tropicalis NOT DETECTED NOT  DETECTED Final  MRSA PCR Screening     Status: None   Collection Time: 06/28/16  3:15 AM  Result Value Ref Range Status   MRSA by PCR NEGATIVE NEGATIVE Final    Comment:        The GeneXpert MRSA Assay (FDA approved for NASAL specimens only), is one component of a comprehensive MRSA colonization surveillance program. It is not intended to diagnose MRSA infection nor to guide or monitor treatment for MRSA infections.      Studies: Dg Chest Portable 1 View  Result Date: 06/27/2016 CLINICAL DATA:  Respiratory distress. EXAM: PORTABLE CHEST 1 VIEW COMPARISON:  None. FINDINGS: The heart size and mediastinal contours are within normal limits. Both lungs are clear. The visualized skeletal structures are unremarkable. IMPRESSION: No active disease. Electronically Signed   By: Thomas Hoff  Dimitrova M.D.   On: 06/27/2016 22:06   Ct Renal Stone Study  Result Date: 06/27/2016 CLINICAL DATA:  70 year old with abdominal pain and sepsis. EXAM: CT ABDOMEN AND PELVIS WITHOUT CONTRAST TECHNIQUE: Multidetector CT imaging of the abdomen and pelvis was performed following the standard protocol without IV contrast. COMPARISON:  None. FINDINGS: Please note that parenchymal abnormalities may be missed without intravenous contrast. Lower chest:  Coronary artery calcifications noted. Hepatobiliary: The liver and gallbladder are unremarkable. There is no evidence of biliary dilatation. Pancreas: Unremarkable Spleen: Unremarkable Adrenals/Urinary Tract: A 6 mm right UVJ calculus causes moderate right hydroureteronephrosis and perinephric inflammation. A 2 mm nonobstructing distal right ureteral calculus identified approximately with 1 cm above the UVJ. Punctate nonobstructing bilateral renal calculi are identified. A nonobstructing 5 mm posteriorly right renal calculus noted. Left renal cysts are present. The adrenal glands are unremarkable. Stomach/Bowel: Colonic diverticulosis noted without diverticulitis. There is no  evidence of bowel obstruction or definite bowel wall thickening. The appendix is normal. Vascular/Lymphatic: Abdominal aortic atherosclerotic calcifications noted. The distal aorta measures 3.1 cm in greatest diameter. No enlarged lymph nodes identified. Reproductive: Prostatectomy changes identified. Other: No free fluid, focal collection or pneumoperitoneum. Musculoskeletal: No acute or suspicious abnormalities. Degenerative changes within the lumbar spine noted. IMPRESSION: 6 mm right UVJ calculus causing moderate right hydroureteronephrosis. 2 mm nonobstructing distal right ureteral calculus, approximately 1 cm above the right UVJ. Bilateral nonobstructing renal calculi. 3.1 cm distal abdominal aortic aneurysm. Recommend followup by ultrasound in 3 years. This recommendation follows ACR consensus guidelines: White Paper of the ACR Incidental Findings Committee II on Vascular Findings. J Am Coll Radiol 2013; 17:001-749 Abdominal aortic atherosclerosis. Coronary artery disease. Electronically Signed   By: Margarette Canada M.D.   On: 06/27/2016 22:42    Scheduled Meds: . cefTRIAXone (ROCEPHIN)  IV  2 g Intravenous Q24H  . docusate sodium  100 mg Oral BID  . FLUoxetine  40 mg Oral Daily  . heparin  5,000 Units Subcutaneous Q8H  . ipratropium-albuterol      . mometasone-formoterol  2 puff Inhalation BID  . morphine      . sodium chloride flush  3 mL Intravenous Q12H  . tamsulosin  0.4 mg Oral Daily   Continuous Infusions: . sodium chloride 50 mL (06/28/16 1523)    Assessment/Plan:  1. Severe sepsis with Klebsiella growing in the blood and acute renal failure and acute respiratory failure and hypotension. IV Rocephin. Continue IV fluid hydration 2. Nephrolithiasis. Status post ureteral stent by Dr. Erlene Quan urology. This should help out with kidney function and treatment of infection. Patient put on Flomax. 3. Acute renal failure likely secondary to kidney stones. I do not have a baseline creatinine.  Continue to monitor daily. Hold hydrochlorothiazide and ibuprofen. 4. Hyponatremia likely secondary to dehydration. 5. Acute respiratory failure with hypoxia. Patient initially required BiPAP. Now on nasal cannula. 6. Depression on fluoxetine 7. Pancytopenia could be secondary to severe sepsis. Check a hepatitis C.  Code Status:     Code Status Orders        Start     Ordered   06/28/16 0326  Full code  Continuous     06/28/16 0325    Code Status History    Date Active Date Inactive Code Status Order ID Comments User Context   This patient has a current code status but no historical code status.     Disposition Plan: To be determined  Consultants:  Urology  Procedures:  Ureteral stent  Antibiotics:  Rocephin  Time spent: 35 minutes. Patient will be monitored again overnight in the CCU secondary to relative hypotension and severe sepsis.  Loletha Grayer  [employer]

## 2016-06-28 NOTE — Care Management (Signed)
[place] forms on patient's chart from ED. Patient has elected to stay at [facility]. RNCM to continue to follow. From home alone.

## 2016-06-28 NOTE — Transfer of Care (Signed)
Immediate Anesthesia Transfer of Care Note  Patient: Edwin Donovan.  Procedure(s) Performed: Procedure(s): CYSTOSCOPY WITH STENT PLACEMENT (Right)  Patient Location: PACU  Anesthesia Type:General  Level of Consciousness: sedated  Airway & Oxygen Therapy: Patient Spontanous Breathing and Patient connected to face mask oxygen  Post-op Assessment: Report given to RN and Post -op Vital signs reviewed and stable  Post vital signs: Reviewed and stable  Last Vitals:  Vitals:   06/28/16 1147 06/28/16 1305  BP: 114/87 113/82  Pulse: 95 89  Resp: 16 (!) 21  Temp: 36.3 C 96.48F    Last Pain:  Vitals:   06/28/16 1147  TempSrc: Tympanic  PainSc: 6          Complications: No apparent anesthesia complications

## 2016-06-28 NOTE — Consult Note (Signed)
Urology Consult  I have been asked to see the patient by Dr. Marcille Blanco, for evaluation and management of right obstructing U the J Stone, sepsis.  Chief Complaint: Right flank pain  History of Present Illness: Edwin Donovan. is a 70 y.o. year old admitted to the ICU overnight after presenting to the emergency room yesterday evening with right flank pain, shortness of breath, fevers and chills, nausea and vomiting. He had been seen earlier last week at the [place] and has a known obstructing right ureteral stone. He continued to feel quite unwell and ultimately presented to our emergency room.  CT scan revealed a 6 mm obstructing right UVJ stone along with bilateral nonobstructing stones. He is leukopenic, tachypnea, moderately suspicious urinalysis and blood cultures are RD positive for Klebsiella and enterococcus. Currently he is normotensive and non-tachycardic. He did have an elevated lactate to 2.2.  His creatinine is elevated to 2.47 with an unknown baseline.  He's never previously required surgery for kidney stones. He does have a personal history of prostate cancer status post prostatectomy, open, performed at the [place] proximal May 5 years ago.  Urology was called by the unit clerk around 10 AM as a routine consult order was placed.  Consult order was placed at 3:25 am (released).  CT scan performed at 22:11 yesterday.  He is currently on Zosyn and ceftriaxone.  Past Medical History:  Diagnosis Date  . COPD (chronic obstructive pulmonary disease) ([place])   . Hypertension     Past Surgical History:  Procedure Laterality Date  . PROSTATECTOMY      Home Medications:  Current Meds  Medication Sig  . albuterol (PROVENTIL HFA;VENTOLIN HFA) 108 (90 Base) MCG/ACT inhaler Inhale 1-2 puffs into the lungs every 6 (six) hours as needed for wheezing or shortness of breath.  Marland Kitchen amLODipine (NORVASC) 10 MG tablet Take 10 mg by mouth daily.  . budesonide-formoterol (SYMBICORT) 160-4.5  MCG/ACT inhaler Inhale 2 puffs into the lungs 2 (two) times daily.  Marland Kitchen FLUoxetine (PROZAC) 20 MG capsule Take 40 mg by mouth daily.  . hydrochlorothiazide (HYDRODIURIL) 25 MG tablet Take 25 mg by mouth daily.  Marland Kitchen ibuprofen (ADVIL,MOTRIN) 600 MG tablet Take 600 mg by mouth every 6 (six) hours as needed for mild pain or moderate pain.  . tamsulosin (FLOMAX) 0.4 MG CAPS capsule Take 0.4 mg by mouth daily.    Allergies: No Known Allergies  Family History  Problem Relation Age of Onset  . Stroke Mother   . Heart attack Father     Social History:  reports that he has quit smoking. He has never used smokeless tobacco. He reports that he uses drugs, including Marijuana. He reports that he does not drink alcohol.  ROS: A complete review of systems was performed.  All systems are negative except for pertinent findings as noted.  Physical Exam:  Vital signs in last 24 hours: Temp:  [97.9 F (36.6 C)-98.5 F (36.9 C)] 97.9 F (36.6 C) (08/11 0936) Pulse Rate:  [83-132] 90 (08/11 0936) Resp:  [14-38] 18 (08/11 0936) BP: (90-116)/(65-86) 116/86 (08/11 0936) SpO2:  [91 %-99 %] 94 % (08/11 0936) Weight:  [285 lb (129.3 kg)-298 lb 8.1 oz (135.4 kg)] 298 lb 8.1 oz (135.4 kg) (08/11 0339) Constitutional:  Alert and oriented, No acute distress.  Lying in ICU bed. HEENT: [place] AT, moist mucus membranes.  Trachea midline, no masses Cardiovascular: Regular rate and rhythm, no clubbing, cyanosis.  1+ LE edema. Respiratory: Normal respiratory effort.  Wearing 02.   GI: Abdomen is soft, nontender, nondistended, no abdominal masses.  Obese. GU: Mild right flank tenderness. Skin: No rashes, bruises or suspicious lesions Lymph: No cervical or inguinal adenopathy Neurologic: Grossly intact, no focal deficits, moving all 4 extremities Psychiatric: Normal mood and affect  Laboratory Data:   Recent Labs  06/27/16 2122  WBC 2.2*  HGB 13.5  HCT 38.7*    Recent Labs  06/27/16 2122  NA 130*  K 4.1  CL  97*  CO2 21*  GLUCOSE 115*  BUN 40*  CREATININE 2.47*  CALCIUM 8.3*   No results for input(s): LABPT, INR in the last 72 hours. No results for input(s): LABURIN in the last 72 hours. Results for orders placed or performed during the hospital encounter of 06/27/16  Blood Culture (routine x 2)     Status: None (Preliminary result)   Collection Time: 06/27/16  9:22 PM  Result Value Ref Range Status   Specimen Description   Final    BLOOD RIGHT ANTECUBITAL Performed at [facility]    Special Requests BOTTLES DRAWN AEROBIC AND ANAEROBIC [place]  Final   Culture NO GROWTH < 12 HOURS  Final   Report Status PENDING  Incomplete  Blood Culture (routine x 2)     Status: None (Preliminary result)   Collection Time: 06/27/16  9:22 PM  Result Value Ref Range Status   Specimen Description BLOOD LEFT WRIST  Final   Special Requests BOTTLES DRAWN AEROBIC AND ANAEROBIC [id number]  Final   Culture  Setup Time   Final    GRAM NEGATIVE RODS IN BOTH AEROBIC AND ANAEROBIC BOTTLES CRITICAL RESULT CALLED TO, READ BACK BY AND VERIFIED WITH: TELDRIN JAMES ON 06/28/16 AT 1751 QSD Organism ID to follow    Culture GRAM NEGATIVE RODS  Final   Report Status PENDING  Incomplete  Blood Culture ID Panel (Reflexed)     Status: Abnormal   Collection Time: 06/27/16  9:22 PM  Result Value Ref Range Status   Enterococcus species NOT DETECTED NOT DETECTED Final   Vancomycin resistance NOT DETECTED NOT DETECTED Final   Listeria monocytogenes NOT DETECTED NOT DETECTED Final   Staphylococcus species NOT DETECTED NOT DETECTED Final   Staphylococcus aureus NOT DETECTED NOT DETECTED Final   Methicillin resistance NOT DETECTED NOT DETECTED Final   Streptococcus species NOT DETECTED NOT DETECTED Final   Streptococcus agalactiae NOT DETECTED NOT DETECTED Final   Streptococcus pneumoniae NOT DETECTED NOT DETECTED Final   Streptococcus pyogenes NOT DETECTED NOT DETECTED Final   Acinetobacter baumannii NOT  DETECTED NOT DETECTED Final   Enterobacteriaceae species DETECTED (A) NOT DETECTED Final    Comment: CRITICAL RESULT CALLED TO, READ BACK BY AND VERIFIED WITH: TELDRIN JAMES ON 06/28/16 AT 0926 QSD    Enterobacter cloacae complex NOT DETECTED NOT DETECTED Final   Escherichia coli NOT DETECTED NOT DETECTED Final   Klebsiella oxytoca NOT DETECTED NOT DETECTED Final   Klebsiella pneumoniae DETECTED (A) NOT DETECTED Final    Comment: CRITICAL RESULT CALLED TO, READ BACK BY AND VERIFIED WITH: TELDRIN JAMES ON 06/28/16 AT 0926 QSD    Proteus species NOT DETECTED NOT DETECTED Final   Serratia marcescens NOT DETECTED NOT DETECTED Final   Carbapenem resistance NOT DETECTED NOT DETECTED Final   Haemophilus influenzae NOT DETECTED NOT DETECTED Final   Neisseria meningitidis NOT DETECTED NOT DETECTED Final   Pseudomonas aeruginosa NOT DETECTED NOT DETECTED Final   Candida albicans NOT DETECTED NOT DETECTED Final   Candida  glabrata NOT DETECTED NOT DETECTED Final   Candida krusei NOT DETECTED NOT DETECTED Final   Candida parapsilosis NOT DETECTED NOT DETECTED Final   Candida tropicalis NOT DETECTED NOT DETECTED Final  MRSA PCR Screening     Status: None   Collection Time: 06/28/16  3:15 AM  Result Value Ref Range Status   MRSA by PCR NEGATIVE NEGATIVE Final    Comment:        The GeneXpert MRSA Assay (FDA approved for NASAL specimens only), is one component of a comprehensive MRSA colonization surveillance program. It is not intended to diagnose MRSA infection nor to guide or monitor treatment for MRSA infections.      Radiologic Imaging: Dg Chest Portable 1 View  Result Date: 06/27/2016 CLINICAL DATA:  Respiratory distress. EXAM: PORTABLE CHEST 1 VIEW COMPARISON:  None. FINDINGS: The heart size and mediastinal contours are within normal limits. Both lungs are clear. The visualized skeletal structures are unremarkable. IMPRESSION: No active disease. Electronically Signed   By: Fidela Salisbury M.D.   On: 06/27/2016 22:06   Ct Renal Stone Study  Result Date: 06/27/2016 CLINICAL DATA:  70 year old with abdominal pain and sepsis. EXAM: CT ABDOMEN AND PELVIS WITHOUT CONTRAST TECHNIQUE: Multidetector CT imaging of the abdomen and pelvis was performed following the standard protocol without IV contrast. COMPARISON:  None. FINDINGS: Please note that parenchymal abnormalities may be missed without intravenous contrast. Lower chest:  Coronary artery calcifications noted. Hepatobiliary: The liver and gallbladder are unremarkable. There is no evidence of biliary dilatation. Pancreas: Unremarkable Spleen: Unremarkable Adrenals/Urinary Tract: A 6 mm right UVJ calculus causes moderate right hydroureteronephrosis and perinephric inflammation. A 2 mm nonobstructing distal right ureteral calculus identified approximately with 1 cm above the UVJ. Punctate nonobstructing bilateral renal calculi are identified. A nonobstructing 5 mm posteriorly right renal calculus noted. Left renal cysts are present. The adrenal glands are unremarkable. Stomach/Bowel: Colonic diverticulosis noted without diverticulitis. There is no evidence of bowel obstruction or definite bowel wall thickening. The appendix is normal. Vascular/Lymphatic: Abdominal aortic atherosclerotic calcifications noted. The distal aorta measures 3.1 cm in greatest diameter. No enlarged lymph nodes identified. Reproductive: Prostatectomy changes identified. Other: No free fluid, focal collection or pneumoperitoneum. Musculoskeletal: No acute or suspicious abnormalities. Degenerative changes within the lumbar spine noted. IMPRESSION: 6 mm right UVJ calculus causing moderate right hydroureteronephrosis. 2 mm nonobstructing distal right ureteral calculus, approximately 1 cm above the right UVJ. Bilateral nonobstructing renal calculi. 3.1 cm distal abdominal aortic aneurysm. Recommend followup by ultrasound in 3 years. This recommendation follows ACR  consensus guidelines: White Paper of the ACR Incidental Findings Committee II on Vascular Findings. J Am Coll Radiol 2013; 42:683-419 Abdominal aortic atherosclerosis. Coronary artery disease. Electronically Signed   By: Margarette Canada M.D.   On: 06/27/2016 22:42    Impression/Assessment:  70 year old male with bacteremia/sepsis from 6 mm right UVJ stone. He is currently hemodynamically stable but condition is certainly guarded.  I have recommended urgent ureteral stent placement and can be accommodated in the operating room today at noon. We discussed that the mainstay of treatment is source control and antibiotics. Decompression of the right kidney is necessary in order to drain all of the infected urine and facilitate urinary drainage.  Risks and benefits of the stent were reviewed. He understands that he will need to return to the operating room in the near future after infection is controlled for definitive management of the stone. He we discussed common locations from retained stent. All discussions were answered. He  is going to proceed as planned.  He is currently on broad-spectrum antibiotics.  Plan:  -To OR for urgent right ureteral stent -Consent reviewed, order placed -PAtient site marked on right  06/28/2016, 10:49 AM  Hollice Espy,  MD

## 2016-06-28 NOTE — H&P (Signed)
Edwin Donovan. is an 70 y.o. male.   Chief Complaint: Shortness of breath HPI: The patient with past medical history of COPD and hypertension presents emergency department complaining of shortness of breath. He states that his dyspnea was accompanied by chills as well as feeling generally achy. Upon presentation the patient was tachypneic and diaphoretic. He was placed on BiPAP which improved his respiratory rate and normal breathing. Laboratory evaluation revealed acute kidney injury. The patient has known that he had kidney stones and had seen the urologist if he is days ago and given ibuprofen as well as Flomax to help pass the stones. CT evaluation in the emergency department revealed hydronephrosis with nonobstructing renal calculi. After initiating septic protocol the emergency department staff called the hospitalist service for admission.  Past Medical History:  Diagnosis Date  . COPD (chronic obstructive pulmonary disease) ([place])   . Hypertension     Past Surgical History:  Procedure Laterality Date  . PROSTATECTOMY      Family History  Problem Relation Age of Onset  . Stroke Mother   . Heart attack Father    Social History:  reports that he has quit smoking. He has never used smokeless tobacco. He reports that he uses drugs, including Marijuana. He reports that he does not drink alcohol.  Allergies: No Known Allergies  Prior to Admission medications   Medication Sig Start Date End Date Taking? Authorizing Provider  albuterol (PROVENTIL HFA;VENTOLIN HFA) 108 (90 Base) MCG/ACT inhaler Inhale 1-2 puffs into the lungs every 6 (six) hours as needed for wheezing or shortness of breath.   Yes Historical Provider, MD  amLODipine (NORVASC) 10 MG tablet Take 10 mg by mouth daily.   Yes Historical Provider, MD  budesonide-formoterol (SYMBICORT) 160-4.5 MCG/ACT inhaler Inhale 2 puffs into the lungs 2 (two) times daily.   Yes Historical Provider, MD  FLUoxetine (PROZAC) 20 MG  capsule Take 40 mg by mouth daily.   Yes Historical Provider, MD  hydrochlorothiazide (HYDRODIURIL) 25 MG tablet Take 25 mg by mouth daily.   Yes Historical Provider, MD  ibuprofen (ADVIL,MOTRIN) 600 MG tablet Take 600 mg by mouth every 6 (six) hours as needed for mild pain or moderate pain.   Yes Historical Provider, MD  tamsulosin (FLOMAX) 0.4 MG CAPS capsule Take 0.4 mg by mouth daily.   Yes Historical Provider, MD     Results for orders placed or performed during the hospital encounter of 06/27/16 (from the past 48 hour(s))  Comprehensive metabolic panel     Status: Abnormal   Collection Time: 06/27/16  9:22 PM  Result Value Ref Range   Sodium 130 (L) 135 - 145 mmol/L   Potassium 4.1 3.5 - 5.1 mmol/L   Chloride 97 (L) 101 - 111 mmol/L   CO2 21 (L) 22 - 32 mmol/L   Glucose, Bld 115 (H) 65 - 99 mg/dL   BUN 40 (H) 6 - 20 mg/dL   Creatinine, Ser 2.47 (H) 0.61 - 1.24 mg/dL   Calcium 8.3 (L) 8.9 - 10.3 mg/dL   Total Protein 6.8 6.5 - 8.1 g/dL   Albumin 3.2 (L) 3.5 - 5.0 g/dL   AST 50 (H) 15 - 41 U/L   ALT 22 17 - 63 U/L   Alkaline Phosphatase 94 38 - 126 U/L   Total Bilirubin 1.0 0.3 - 1.2 mg/dL   GFR calc non Af Amer 24 (L) >60 mL/min   GFR calc Af Amer 28 (L) >60 mL/min    Comment: (NOTE)  The eGFR has been calculated using the CKD EPI equation. This calculation has not been validated in all clinical situations. eGFR's persistently <60 mL/min signify possible Chronic Kidney Disease.    Anion gap 12 5 - 15  CBC WITH DIFFERENTIAL     Status: Abnormal   Collection Time: 06/27/16  9:22 PM  Result Value Ref Range   WBC 2.2 (L) 3.8 - 10.6 K/uL   RBC 4.18 (L) 4.40 - 5.90 MIL/uL   Hemoglobin 13.5 13.0 - 18.0 g/dL   HCT 38.7 (L) 40.0 - 52.0 %   MCV 92.6 80.0 - 100.0 fL   MCH 32.4 26.0 - 34.0 pg   MCHC 35.0 32.0 - 36.0 g/dL   RDW 14.4 11.5 - 14.5 %   Platelets 86 (L) 150 - 440 K/uL   Neutrophils Relative % 88% %   Neutro Abs 2.0 1.4 - 6.5 K/uL   Lymphocytes Relative 11% %    Lymphs Abs 0.2 (L) 1.0 - 3.6 K/uL   Monocytes Relative 1% %   Monocytes Absolute 0.0 (L) 0.2 - 1.0 K/uL   Eosinophils Relative 0% %   Eosinophils Absolute 0.0 0 - 0.7 K/uL   Basophils Relative 0% %   Basophils Absolute 0.0 0 - 0.1 K/uL  Blood gas, arterial ([facility] & [facility] ONLY)     Status: Abnormal (Preliminary result)   Collection Time: 06/27/16  9:22 PM  Result Value Ref Range   FIO2 PENDING    LHR 8 resp/min   Inspiratory PAP 16    Expiratory PAP 6    pH, Arterial 7.49 (H) 7.350 - 7.450   pCO2 arterial 27 (L) 32.0 - 48.0 mmHg   pO2, Arterial 129 (H) 83.0 - 108.0 mmHg   Bicarbonate 20.6 (L) 21.0 - 28.0 mEq/L   Acid-base deficit 1.4 0.0 - 2.0 mmol/L   O2 Saturation 99.2 %   Patient temperature 37.0    Collection site LEFT RADIAL    Sample type ARTERIAL DRAW    Allens test (pass/fail) PASS PASS  Urinalysis complete, with microscopic     Status: Abnormal   Collection Time: 06/27/16  9:22 PM  Result Value Ref Range   Color, Urine AMBER (A) YELLOW   APPearance CLOUDY (A) CLEAR   Glucose, UA NEGATIVE NEGATIVE mg/dL   Bilirubin Urine NEGATIVE NEGATIVE   Ketones, ur NEGATIVE NEGATIVE mg/dL   Specific Gravity, Urine 1.017 1.005 - 1.030   Hgb urine dipstick 3+ (A) NEGATIVE   pH 5.0 5.0 - 8.0   Protein, ur 100 (A) NEGATIVE mg/dL   Nitrite NEGATIVE NEGATIVE   Leukocytes, UA TRACE (A) NEGATIVE   RBC / HPF 0-5 0 - 5 RBC/hpf   WBC, UA 6-30 0 - 5 WBC/hpf   Bacteria, UA MANY (A) NONE SEEN   Squamous Epithelial / LPF 0-5 (A) NONE SEEN   Amorphous Crystal PRESENT   Brain natriuretic peptide     Status: Abnormal   Collection Time: 06/27/16  9:22 PM  Result Value Ref Range   B Natriuretic Peptide 239.0 (H) 0.0 - 100.0 pg/mL  Lactic acid, plasma     Status: Abnormal   Collection Time: 06/27/16 11:37 PM  Result Value Ref Range   Lactic Acid, Venous 2.2 (HH) 0.5 - 1.9 mmol/L    Comment: CRITICAL RESULT CALLED TO, READ BACK BY AND VERIFIED WITH JENNA ELLINGTON @ 0034 ON 06/28/2016 BY CAF     Dg Chest Portable 1 View  Result Date: 06/27/2016 CLINICAL DATA:  Respiratory distress. EXAM: PORTABLE  CHEST 1 VIEW COMPARISON:  None. FINDINGS: The heart size and mediastinal contours are within normal limits. Both lungs are clear. The visualized skeletal structures are unremarkable. IMPRESSION: No active disease. Electronically Signed   By: Fidela Salisbury M.D.   On: 06/27/2016 22:06   Ct Renal Stone Study  Result Date: 06/27/2016 CLINICAL DATA:  70 year old with abdominal pain and sepsis. EXAM: CT ABDOMEN AND PELVIS WITHOUT CONTRAST TECHNIQUE: Multidetector CT imaging of the abdomen and pelvis was performed following the standard protocol without IV contrast. COMPARISON:  None. FINDINGS: Please note that parenchymal abnormalities may be missed without intravenous contrast. Lower chest:  Coronary artery calcifications noted. Hepatobiliary: The liver and gallbladder are unremarkable. There is no evidence of biliary dilatation. Pancreas: Unremarkable Spleen: Unremarkable Adrenals/Urinary Tract: A 6 mm right UVJ calculus causes moderate right hydroureteronephrosis and perinephric inflammation. A 2 mm nonobstructing distal right ureteral calculus identified approximately with 1 cm above the UVJ. Punctate nonobstructing bilateral renal calculi are identified. A nonobstructing 5 mm posteriorly right renal calculus noted. Left renal cysts are present. The adrenal glands are unremarkable. Stomach/Bowel: Colonic diverticulosis noted without diverticulitis. There is no evidence of bowel obstruction or definite bowel wall thickening. The appendix is normal. Vascular/Lymphatic: Abdominal aortic atherosclerotic calcifications noted. The distal aorta measures 3.1 cm in greatest diameter. No enlarged lymph nodes identified. Reproductive: Prostatectomy changes identified. Other: No free fluid, focal collection or pneumoperitoneum. Musculoskeletal: No acute or suspicious abnormalities. Degenerative changes within the  lumbar spine noted. IMPRESSION: 6 mm right UVJ calculus causing moderate right hydroureteronephrosis. 2 mm nonobstructing distal right ureteral calculus, approximately 1 cm above the right UVJ. Bilateral nonobstructing renal calculi. 3.1 cm distal abdominal aortic aneurysm. Recommend followup by ultrasound in 3 years. This recommendation follows ACR consensus guidelines: White Paper of the ACR Incidental Findings Committee II on Vascular Findings. J Am Coll Radiol 2013; 67:591-638 Abdominal aortic atherosclerosis. Coronary artery disease. Electronically Signed   By: Margarette Canada M.D.   On: 06/27/2016 22:42    Review of Systems  Constitutional: Positive for chills and diaphoresis. Negative for fever.  HENT: Negative for sore throat and tinnitus.   Eyes: Negative for blurred vision and redness.  Respiratory: Positive for shortness of breath. Negative for cough.   Cardiovascular: Negative for chest pain, palpitations, orthopnea and PND.  Gastrointestinal: Negative for abdominal pain, diarrhea, nausea and vomiting.  Genitourinary: Negative for dysuria, frequency and urgency.  Musculoskeletal: Negative for joint pain and myalgias.  Skin: Negative for rash.       No lesions  Neurological: Positive for weakness. Negative for speech change and focal weakness.  Endo/Heme/Allergies: Does not bruise/bleed easily.       No temperature intolerance  Psychiatric/Behavioral: Negative for depression and suicidal ideas.    Blood pressure 113/70, pulse (!) 107, temperature 98.5 F (36.9 C), temperature source Oral, resp. rate 16, height 6' (1.829 m), weight 129.3 kg (285 lb), SpO2 94 %. Physical Exam  Constitutional: He is oriented to person, place, and time. He appears well-developed and well-nourished. He appears distressed.  HENT:  Head: Normocephalic and atraumatic.  Mouth/Throat: Oropharynx is clear and moist.  Eyes: Conjunctivae and EOM are normal. Pupils are equal, round, and reactive to light. No  scleral icterus.  Neck: Normal range of motion. Neck supple. No JVD present. No tracheal deviation present. No thyromegaly present.  Cardiovascular: Regular rhythm and normal heart sounds.  Tachycardia present.  Exam reveals no gallop and no friction rub.   No murmur heard. Respiratory: Breath sounds normal. He  is in respiratory distress.  GI: Soft. Bowel sounds are normal. He exhibits no distension. There is no tenderness.  Genitourinary:  Genitourinary Comments: Deferred  Musculoskeletal: Normal range of motion. He exhibits no edema.  Lymphadenopathy:    He has no cervical adenopathy.  Neurological: He is alert and oriented to person, place, and time. No cranial nerve deficit.  Skin: Skin is warm. No rash noted. He is diaphoretic. No erythema. There is pallor.  Psychiatric: He has a normal mood and affect. His behavior is normal. Judgment and thought content normal.     Assessment/Plan This is a 70 year old male admitted for sepsis. 1. Sepsis: The patient needs criteria via tachycardia and tachypnea. Notably the patient is also leukopenic. Etiology of the latter is unclear although age may be a factor. He reports relatively good health until today. Continue vancomycin and Zosyn. Follow blood cultures for growth and sensitivities. I have ordered placement of central access due to gradually downward trending blood pressure. 2. Acute renal failure: May be prerenal due to sepsis or secondary to spontaneously resolved obstructive bilateral uropathy. Continue aggressive fluid resuscitation. Avoid nephrotoxic agents. 3. Kidney stones: Bilaterally; urology has been consulted for possible stent placement. Continue Flomax 4. COPD: The patient denies symptoms of exacerbation prior to onset of sepsis. Continue BiPAP for respiratory support. ABG as necessary. Continue inhaled corticosteroid. Albuterol as necessary 5. Hypertension: Hold antihypertensive medication for now as the patient's blood pressure is  relatively low. 6. Depression: Continue Prozac 7. DVT prophylaxis: Heparin 8. GI prophylaxis: None The patient is a full code. Time spent on admission orders and patient care approximately 45 minutes  Harrie Foreman, MD 06/28/2016, 1:44 AM

## 2016-06-28 NOTE — ED Notes (Signed)
Called pharmacy to request medication 

## 2016-06-28 NOTE — Anesthesia Postprocedure Evaluation (Signed)
Anesthesia Post Note  Patient: Edwin Donovan.  Procedure(s) Performed: Procedure(s) (LRB): CYSTOSCOPY WITH STENT PLACEMENT (Right)  Patient location during evaluation: PACU Anesthesia Type: General Level of consciousness: awake and alert Pain management: pain level controlled Vital Signs Assessment: post-procedure vital signs reviewed and stable Respiratory status: spontaneous breathing, nonlabored ventilation, respiratory function stable and patient connected to nasal cannula oxygen Cardiovascular status: blood pressure returned to baseline and stable Postop Assessment: no signs of nausea or vomiting Anesthetic complications: no    Last Vitals:  Vitals:   06/28/16 1305 06/28/16 1335  BP: 113/82 104/75  Pulse: 89 93  Resp: (!) 21 20  Temp: (!) 35.9 C     Last Pain:  Vitals:   06/28/16 1147  TempSrc: Tympanic  PainSc: 6                  Broadus John K Myrene Bougher

## 2016-06-28 NOTE — Progress Notes (Signed)
Unfortunately, patient was fed full breakfast despite need to urgent intervention in setting of sepsis.  At this point, risks outweigh benefits.   Plan to procede despite NPO status given urgent nature.  Hollice Espy, MD

## 2016-06-28 NOTE — Progress Notes (Signed)
Pre-op called to take patient to OR. Explained patient had eaten breakfast just before Dr. Erlene Quan had come to room to explain that patient would have procedure. There were no orders for patient to be NPO or plans for OR until Dr. Erlene Quan had seen patient this AM at about 11am.

## 2016-06-28 NOTE — Progress Notes (Signed)
After further assessment and discussion with ICU Nurse  during Multidisciplinary rounds, patient sepsis is resolving, BP improving with IVF's LA down to 1.6. Patient has Klebsiella Bacteremia  Will NOT place CVL at this time    Corrin Parker, M.D.  [facility]  Medical Director [place] Director [facility] Cardio-Pulmonary Department

## 2016-06-28 NOTE — ED Notes (Signed)
Care of pt assumed.

## 2016-06-28 NOTE — Op Note (Signed)
Date of procedure: 06/28/16  Preoperative diagnosis:  1. Right obstructing UVJ stone 2. Sepsis of urinary source   Postoperative diagnosis:  1. Same 2. Right pyonephritis   Procedure: 1. cystoscoy   Surgeon: Hollice Espy, MD  Anesthesia: General  Complications: None  Intraoperative findings: Hydronephrosis down to the level of the right UVJ with torturous ureter. Significant reflux of highly purulent material upon stent placement  EBL: minimal  Specimens: none  Drains: 6 x 26 French double-J ureteral stent/16 [place] ICU Foley catheter with temperature probe.  Indication: Edwin Donovan. is a 70 y.o. patient with 6 known right UVJ stone, urosepsis with bacteremia.  After reviewing the management options for treatment, he elected to proceed with the above surgical procedure(s). We have discussed the potential benefits and risks of the procedure, side effects of the proposed treatment, the likelihood of the patient achieving the goals of the procedure, and any potential problems that might occur during the procedure or recuperation. Informed consent has been obtained.  Description of procedure:  The patient was taken to the operating room and general anesthesia was induced.  The patient was placed in the dorsal lithotomy position, prepped and draped in the usual sterile fashion, and preoperative antibiotics were administered. A preoperative time-out was performed.   A 21 French rigid cystoscopy scope was advanced per urethra into the bladder. Of note the prostate was surgically absent with a wide bladder neck. Within the bladder, there is significant amount of debris. Attention was then turned to the right ureteral orifice which was cannulated using a 5 [place] open-ended ureteral catheter. A very gentle retrograde pyelogram was performed which revealed dilated ureter and contrast only a flexible to the mid ureter was somewhat tortuous. In order to avoid high pressures in  the kidney, no additional contrast was injected therefore the collecting system was not completely opacified. A wire was then placed all the way up to level of the kidney. A 6 x 26 French double-J ureteral stent was then advanced over the wire up to level of the kidney. The wire was partially drawn until full coil was noted within the renal pelvis. The wire was then fully withdrawn and a full coil was noted within the bladder. Upon stent placement, there was copious reflux of very purulent, debris-filled material consistent with pyonephrosis.  A 16 French ICU Foley catheter was advanced per urethra into the bladder. The balloon was filled with 10 cc of sterile water. The patient was then repositioned the supine position, reversed from anesthesia, taken to the PACU., Of note, the patient was fairly hypotensive intraoperatively required vasopressin. He was afebrile.  Hollice Espy, M.D.

## 2016-06-28 NOTE — Anesthesia Preprocedure Evaluation (Signed)
Anesthesia Evaluation  Patient identified by MRN, date of birth, ID band Patient awake    Reviewed: Allergy & Precautions, H&P , NPO status , Patient's Chart, lab work & pertinent test results  History of Anesthesia Complications Negative for: history of anesthetic complications  Airway Mallampati: II  TM Distance: >3 FB Neck ROM: limited    Dental  (+) Poor Dentition, Chipped, Missing, Edentulous Lower   Pulmonary shortness of breath and with exertion, sleep apnea , COPD, former smoker,    Pulmonary exam normal breath sounds clear to auscultation       Cardiovascular Exercise Tolerance: Poor hypertension, (-) angina(-) Past MI Normal cardiovascular exam Rhythm:regular Rate:Normal     Neuro/Psych negative neurological ROS  negative psych ROS   GI/Hepatic negative GI ROS, Neg liver ROS, neg GERD  ,  Endo/Other  negative endocrine ROS  Renal/GU negative Renal ROS  negative genitourinary   Musculoskeletal   Abdominal   Peds  Hematology negative hematology ROS (+)   Anesthesia Other Findings Past Medical History: No date: COPD (chronic obstructive pulmonary disease) (* No date: Hypertension  Past Surgical History: No date: PROSTATECTOMY  BMI    Body Mass Index:  41.56 kg/m      Reproductive/Obstetrics negative OB ROS                             Anesthesia Physical Anesthesia Plan  ASA: IV and emergent  Anesthesia Plan: General ETT and Rapid Sequence   Post-op Pain Management:    Induction:   Airway Management Planned:   Additional Equipment:   Intra-op Plan:   Post-operative Plan:   Informed Consent: I have reviewed the patients History and Physical, chart, labs and discussed the procedure including the risks, benefits and alternatives for the proposed anesthesia with the patient or authorized representative who has indicated his/her understanding and acceptance.    Dental Advisory Given  Plan Discussed with: Anesthesiologist, CRNA and Surgeon  Anesthesia Plan Comments: (Surgeon declaring this an emergency )        Anesthesia Quick Evaluation

## 2016-06-28 NOTE — Progress Notes (Signed)
PHARMACY - PHYSICIAN COMMUNICATION CRITICAL VALUE ALERT - BLOOD CULTURE IDENTIFICATION (BCID)  Results for orders placed or performed during the hospital encounter of 06/27/16  Blood Culture ID Panel (Reflexed) (Collected: 06/27/2016  9:22 PM)  Result Value Ref Range   Enterococcus species NOT DETECTED NOT DETECTED   Vancomycin resistance NOT DETECTED NOT DETECTED   Listeria monocytogenes NOT DETECTED NOT DETECTED   Staphylococcus species NOT DETECTED NOT DETECTED   Staphylococcus aureus NOT DETECTED NOT DETECTED   Methicillin resistance NOT DETECTED NOT DETECTED   Streptococcus species NOT DETECTED NOT DETECTED   Streptococcus agalactiae NOT DETECTED NOT DETECTED   Streptococcus pneumoniae NOT DETECTED NOT DETECTED   Streptococcus pyogenes NOT DETECTED NOT DETECTED   Acinetobacter baumannii NOT DETECTED NOT DETECTED   Enterobacteriaceae species DETECTED (A) NOT DETECTED   Enterobacter cloacae complex NOT DETECTED NOT DETECTED   Escherichia coli NOT DETECTED NOT DETECTED   Klebsiella oxytoca NOT DETECTED NOT DETECTED   Klebsiella pneumoniae DETECTED (A) NOT DETECTED   Proteus species NOT DETECTED NOT DETECTED   Serratia marcescens NOT DETECTED NOT DETECTED   Carbapenem resistance NOT DETECTED NOT DETECTED   Haemophilus influenzae NOT DETECTED NOT DETECTED   Neisseria meningitidis NOT DETECTED NOT DETECTED   Pseudomonas aeruginosa NOT DETECTED NOT DETECTED   Candida albicans NOT DETECTED NOT DETECTED   Candida glabrata NOT DETECTED NOT DETECTED   Candida krusei NOT DETECTED NOT DETECTED   Candida parapsilosis NOT DETECTED NOT DETECTED   Candida tropicalis NOT DETECTED NOT DETECTED    Name of physician (or Provider) Contacted: Kasa  Changes to prescribed antibiotics required: Rocephin 2 g IV q24 hours.    Jolyne Laye D 06/28/2016  9:52 AM

## 2016-06-28 NOTE — Anesthesia Procedure Notes (Signed)
Procedure Name: Intubation Date/Time: 06/28/2016 1:23 PM Performed by: Dionne Bucy Pre-anesthesia Checklist: Patient identified, Patient being monitored, Timeout performed, Emergency Drugs available and Suction available Patient Re-evaluated:Patient Re-evaluated prior to inductionOxygen Delivery Method: Circle system utilized Preoxygenation: Pre-oxygenation with 100% oxygen Intubation Type: IV induction, Rapid sequence and Cricoid Pressure applied Laryngoscope Size: Mac and 4 Grade View: Grade I Tube type: Oral Tube size: 7.5 mm Number of attempts: 1 Airway Equipment and Method: Stylet Placement Confirmation: ETT inserted through vocal cords under direct vision,  positive ETCO2 and breath sounds checked- equal and bilateral Secured at: 22 cm Tube secured with: Tape Dental Injury: Teeth and Oropharynx as per pre-operative assessment

## 2016-06-29 DIAGNOSIS — N1330 Unspecified hydronephrosis: Secondary | ICD-10-CM

## 2016-06-29 DIAGNOSIS — N12 Tubulo-interstitial nephritis, not specified as acute or chronic: Secondary | ICD-10-CM

## 2016-06-29 LAB — BASIC METABOLIC PANEL
ANION GAP: 8 (ref 5–15)
BUN: 57 mg/dL — ABNORMAL HIGH (ref 6–20)
CALCIUM: 7.6 mg/dL — AB (ref 8.9–10.3)
CO2: 24 mmol/L (ref 22–32)
Chloride: 102 mmol/L (ref 101–111)
Creatinine, Ser: 1.65 mg/dL — ABNORMAL HIGH (ref 0.61–1.24)
GFR, EST AFRICAN AMERICAN: 46 mL/min — AB (ref >60)
GFR, EST NON AFRICAN AMERICAN: 40 mL/min — AB (ref >60)
Glucose, Bld: 160 mg/dL — ABNORMAL HIGH (ref 65–99)
Potassium: 4.3 mmol/L (ref 3.5–5.1)
SODIUM: 134 mmol/L — AB (ref 135–145)

## 2016-06-29 LAB — CBC
HCT: 34.9 % — ABNORMAL LOW (ref 40.0–52.0)
HEMOGLOBIN: 12 g/dL — AB (ref 13.0–18.0)
MCH: 32.4 pg (ref 26.0–34.0)
MCHC: 34.4 g/dL (ref 32.0–36.0)
MCV: 94.1 fL (ref 80.0–100.0)
Platelets: 63 10*3/uL — ABNORMAL LOW (ref 150–440)
RBC: 3.71 MIL/uL — AB (ref 4.40–5.90)
RDW: 14.6 % — ABNORMAL HIGH (ref 11.5–14.5)
WBC: 16.7 10*3/uL — AB (ref 3.8–10.6)

## 2016-06-29 MED ORDER — MORPHINE SULFATE (PF) 2 MG/ML IV SOLN
2.0000 mg | INTRAVENOUS | Status: DC | PRN
  Administered 2016-06-30 – 2016-07-04 (×11): 2 mg via INTRAVENOUS
  Filled 2016-06-29 (×11): qty 1

## 2016-06-29 MED ORDER — MORPHINE SULFATE (PF) 4 MG/ML IV SOLN
4.0000 mg | Freq: Once | INTRAVENOUS | Status: AC
  Administered 2016-06-29: 4 mg via INTRAVENOUS
  Filled 2016-06-29: qty 1

## 2016-06-29 MED ORDER — TRAZODONE HCL 100 MG PO TABS
100.0000 mg | ORAL_TABLET | Freq: Every evening | ORAL | Status: DC | PRN
  Administered 2016-06-29 – 2016-07-02 (×3): 100 mg via ORAL
  Filled 2016-06-29 (×4): qty 1

## 2016-06-29 NOTE — Addendum Note (Signed)
Addendum  created 06/29/16 0911 by Emmie Niemann, MD   Delete clinical note, Sign clinical note

## 2016-06-29 NOTE — Care Management Important Message (Signed)
Important Message  Patient Details  Name: Edwin Donovan. MRN: [id number] Date of Birth: 04-Jul-1946   Medicare Important Message Given:  Yes    Seini Lannom A, RN 06/29/2016, 4:11 PM

## 2016-06-29 NOTE — Anesthesia Post-op Follow-up Note (Deleted)
  Anesthesia Pain Follow-up Note  Patient: Edwin Donovan.  Day #: 1  Date of Follow-up: 06/29/2016 Time: 9:10 AM  Last Vitals:  Vitals:   06/29/16 0700 06/29/16 0800  BP: 104/81 115/89  Pulse: 81 83  Resp: 16 17  Temp:      Level of Consciousness: alert  Pain: mild   Side Effects:None  Catheter Site Exam:clean, dry  Anti-Coag Meds    Start     Dose/Rate Route Frequency Ordered Stop   06/28/16 0600  heparin injection 5,000 Units     5,000 Units Subcutaneous Every 8 hours 06/28/16 0325         Plan: D/C from anesthesia care  Maloni Musleh

## 2016-06-29 NOTE — Progress Notes (Signed)
Pt stable. Transferred to floor. He is feeling much better. WBC count more appropriate today.   Vitals:   06/29/16 1240 06/29/16 1434  BP: 116/82 118/76  Pulse: 81 89  Resp: 15 (!) 22  Temp: 97.7 F (36.5 C) 97.7 F (36.5 C)    NAD  A&Ox3 Clear urine in bag  CBC    Component Value Date/Time   WBC 16.7 (H) 06/29/2016 0315   RBC 3.71 (L) 06/29/2016 0315   HGB 12.0 (L) 06/29/2016 0315   HCT 34.9 (L) 06/29/2016 0315   PLT 63 (L) 06/29/2016 0315   MCV 94.1 06/29/2016 0315   MCH 32.4 06/29/2016 0315   MCHC 34.4 06/29/2016 0315   RDW 14.6 (H) 06/29/2016 0315   LYMPHSABS 0.2 (L) 06/27/2016 2122   MONOABS 0.0 (L) 06/27/2016 2122   EOSABS 0.0 06/27/2016 2122   BASOSABS 0.0 06/27/2016 2122     I reviewed CT images. Blood Cx with enterobacter and klebsiella.   Assessment/plan: Sepsis, right distal ureteral stone status post ureteral stent-discussed with patient again rationale for staged procedure and why we did not treat the stone. Discussed importance of follow-up with ureteral stent as the stents are temporary. Failure to follow-up could result in permanent kidney damage and/or life-threatening infection. He said he did not want to go back to the [place] and again I emphasized I did not care where he followed up, but it is very important to follow-up with urology for stent / stone removal.  Foley catheter can be removed from a urology perspective. He can be d/c'd from GU pt of view when cultures and sensitivities are finalized.

## 2016-06-29 NOTE — Progress Notes (Signed)
Called Dr. Lavetta Nielsen to get an order for Trazadone for bedtime.  He gave a verbal order for 100 mg.

## 2016-06-29 NOTE — Progress Notes (Signed)
Notified Dr. Jannifer Franklin of patients pain level of 10/10 after receiving ordered oxy and morphine. MD gave orders for a one time dose of '4mg'$  of morphine and will increase the '1mg'$  morphine order to '2mg'$ .

## 2016-06-29 NOTE — Progress Notes (Signed)
Patient moved to room 204 by wheelchair with RN.  Patient on o2 for transport.  Upon patient moving from wheelchair to new bed patient had audible wheezes therefore Danae Chen, RN called Fritz Pickerel, RRT and asked for PRN breathing treatment.  No acute respiratory distress noted.

## 2016-06-29 NOTE — Progress Notes (Signed)
Report called to Edwin Chen, RN on 2C.  Patient has been A&Ox4.  NSR per cardiac monitor.  VSS.  o2 sats mid 90's on 2 L nasal cannula.  Tolerating diet.  Excellent UOP.

## 2016-06-30 ENCOUNTER — Inpatient Hospital Stay: Payer: Medicare Other

## 2016-06-30 ENCOUNTER — Encounter: Payer: Self-pay | Admitting: Radiology

## 2016-06-30 ENCOUNTER — Inpatient Hospital Stay (HOSPITAL_COMMUNITY)
Admit: 2016-06-30 | Discharge: 2016-06-30 | Disposition: A | Discharge status: Home / Self Care | Payer: Medicare Other | Attending: Internal Medicine | Admitting: Internal Medicine

## 2016-06-30 DIAGNOSIS — R079 Chest pain, unspecified: Secondary | ICD-10-CM

## 2016-06-30 LAB — CULTURE, BLOOD (ROUTINE X 2)

## 2016-06-30 LAB — CBC
HCT: 37.1 % — ABNORMAL LOW (ref 40.0–52.0)
HEMOGLOBIN: 12.8 g/dL — AB (ref 13.0–18.0)
MCH: 32.3 pg (ref 26.0–34.0)
MCHC: 34.5 g/dL (ref 32.0–36.0)
MCV: 93.6 fL (ref 80.0–100.0)
Platelets: 57 10*3/uL — ABNORMAL LOW (ref 150–440)
RBC: 3.96 MIL/uL — AB (ref 4.40–5.90)
RDW: 14.7 % — ABNORMAL HIGH (ref 11.5–14.5)
WBC: 14.6 10*3/uL — ABNORMAL HIGH (ref 3.8–10.6)

## 2016-06-30 LAB — BASIC METABOLIC PANEL
Anion gap: 5 (ref 5–15)
BUN: 39 mg/dL — AB (ref 6–20)
CHLORIDE: 107 mmol/L (ref 101–111)
CO2: 26 mmol/L (ref 22–32)
Calcium: 8.2 mg/dL — ABNORMAL LOW (ref 8.9–10.3)
Creatinine, Ser: 0.95 mg/dL (ref 0.61–1.24)
GFR calc Af Amer: >60 mL/min (ref >60)
GFR calc non Af Amer: >60 mL/min (ref >60)
GLUCOSE: 122 mg/dL — AB (ref 65–99)
POTASSIUM: 4.3 mmol/L (ref 3.5–5.1)
Sodium: 138 mmol/L (ref 135–145)

## 2016-06-30 LAB — ECHOCARDIOGRAM COMPLETE
Height: 71 in
Weight: 4790.4 oz

## 2016-06-30 LAB — HEPATITIS C ANTIBODY: HCV Ab: 0.1 s/co ratio (ref 0.0–0.9)

## 2016-06-30 MED ORDER — AZITHROMYCIN 500 MG IV SOLR
500.0000 mg | INTRAVENOUS | Status: DC
  Administered 2016-06-30 – 2016-07-03 (×4): 500 mg via INTRAVENOUS
  Filled 2016-06-30 (×4): qty 500

## 2016-06-30 MED ORDER — IOPAMIDOL (ISOVUE-370) INJECTION 76%
75.0000 mL | Freq: Once | INTRAVENOUS | Status: AC | PRN
  Administered 2016-06-30: 75 mL via INTRAVENOUS

## 2016-06-30 NOTE — Progress Notes (Signed)
[facility] PROGRESS NOTE  Edwin Donovan. NGE:[id number] DOB: 1946/05/09 DOA: 06/27/2016 PCP: [facility]  HPI/Subjective: Patient reports his breathing is much improved  He continues to have some pain in his groin and back  Objective: Vitals:   06/29/16 2059 06/30/16 0427  BP: 130/83 136/88  Pulse: 95 (!) 103  Resp: 20 20  Temp: 97.9 F (36.6 C) 98 F (36.7 C)    Filed Weights   06/28/16 0339 06/28/16 1147 06/30/16 0435  Weight: 135.4 kg (298 lb 8.1 oz) 135.2 kg (298 lb) 135.8 kg (299 lb 6.4 oz)    ROS: Review of Systems  Constitutional: Negative for chills and fever.  Eyes: Negative for blurred vision.  Respiratory: Positive for shortness of breath. Negative for cough.   Cardiovascular: Negative for chest pain.  Gastrointestinal: Positive for abdominal pain. Negative for constipation, diarrhea, nausea and vomiting.  Genitourinary: Positive for flank pain. Negative for dysuria.  Musculoskeletal: Negative for joint pain.  Neurological: Negative for dizziness and headaches.   Exam: Physical Exam  Constitutional: He is oriented to person, place, and time.  HENT:  Nose: No mucosal edema.  Mouth/Throat: No oropharyngeal exudate or posterior oropharyngeal edema.  Eyes: Conjunctivae, EOM and lids are normal. Pupils are equal, round, and reactive to light.  Neck: No JVD present. Carotid bruit is not present. No edema present. No thyroid mass and no thyromegaly present.  Cardiovascular: S1 normal and S2 normal.  Exam reveals no gallop.   No murmur heard. Pulses:      Dorsalis pedis pulses are 2+ on the right side, and 2+ on the left side.  Respiratory: No respiratory distress. He has no wheezes. He has no rhonchi.  Decreased bs   GI: Soft. Bowel sounds are normal. There is tenderness in the right lower quadrant.  Musculoskeletal:       Right ankle: He exhibits swelling.       Left ankle: He exhibits swelling.  Lymphadenopathy:    He has no  cervical adenopathy.  Neurological: He is alert and oriented to person, place, and time. No cranial nerve deficit.  Skin: Skin is warm. No rash noted. Nails show no clubbing.  Psychiatric: He has a normal mood and affect.      Data Reviewed: Basic Metabolic Panel:  Recent Labs Lab 06/27/16 2122 06/29/16 0315 06/30/16 0553  NA 130* 134* 138  K 4.1 4.3 4.3  CL 97* 102 107  CO2 21* 24 26  GLUCOSE 115* 160* 122*  BUN 40* 57* 39*  CREATININE 2.47* 1.65* 0.95  CALCIUM 8.3* 7.6* 8.2*   Liver Function Tests:  Recent Labs Lab 06/27/16 2122  AST 50*  ALT 22  ALKPHOS 94  BILITOT 1.0  PROT 6.8  ALBUMIN 3.2*   CBC:  Recent Labs Lab 06/27/16 2122 06/29/16 0315 06/30/16 0553  WBC 2.2* 16.7* 14.6*  NEUTROABS 2.0  --   --   HGB 13.5 12.0* 12.8*  HCT 38.7* 34.9* 37.1*  MCV 92.6 94.1 93.6  PLT 86* 63* 57*   Cardiac Enzymes:  Recent Labs Lab 06/28/16 0245  TROPONINI 0.06*   BNP (last 3 results)  Recent Labs  06/27/16 2122  BNP 239.0*     CBG:  Recent Labs Lab 06/28/16 0316  GLUCAP 199*    Recent Results (from the past 240 hour(s))  Urine culture     Status: Abnormal (Preliminary result)   Collection Time: 06/27/16  9:21 PM  Result Value Ref Range Status   Specimen  Description URINE, RANDOM  Final   Special Requests NONE  Final   Culture (A)  Final    >=100,000 COLONIES/mL GRAM NEGATIVE RODS CULTURE REINCUBATED FOR BETTER GROWTH Performed at [facility] [place]    Report Status PENDING  Incomplete  Blood Culture (routine x 2)     Status: None (Preliminary result)   Collection Time: 06/27/16  9:22 PM  Result Value Ref Range Status   Specimen Description   Final    BLOOD RIGHT ANTECUBITAL Performed at [facility]    Special Requests BOTTLES DRAWN AEROBIC AND ANAEROBIC 5CCAERO,4CCANA  Final   Culture  Setup Time   Final    GRAM NEGATIVE RODS IN BOTH AEROBIC AND ANAEROBIC BOTTLES CRITICAL VALUE NOTED.  VALUE IS CONSISTENT WITH PREVIOUSLY  REPORTED AND CALLED VALUE.    Culture GRAM NEGATIVE RODS  Final   Report Status PENDING  Incomplete  Blood Culture (routine x 2)     Status: Abnormal (Preliminary result)   Collection Time: 06/27/16  9:22 PM  Result Value Ref Range Status   Specimen Description BLOOD LEFT WRIST  Final   Special Requests BOTTLES DRAWN AEROBIC AND ANAEROBIC 5CCAERO,4CCANA  Final   Culture  Setup Time   Final    GRAM NEGATIVE RODS IN BOTH AEROBIC AND ANAEROBIC BOTTLES CRITICAL RESULT CALLED TO, READ BACK BY AND VERIFIED WITH: TELDRIN JAMES ON 06/28/16 AT 0926 QSD    Culture (A)  Final    KLEBSIELLA PNEUMONIAE SUSCEPTIBILITIES TO FOLLOW Performed at [facility]    Report Status PENDING  Incomplete  Blood Culture ID Panel (Reflexed)     Status: Abnormal   Collection Time: 06/27/16  9:22 PM  Result Value Ref Range Status   Enterococcus species NOT DETECTED NOT DETECTED Final   Vancomycin resistance NOT DETECTED NOT DETECTED Final   Listeria monocytogenes NOT DETECTED NOT DETECTED Final   Staphylococcus species NOT DETECTED NOT DETECTED Final   Staphylococcus aureus NOT DETECTED NOT DETECTED Final   Methicillin resistance NOT DETECTED NOT DETECTED Final   Streptococcus species NOT DETECTED NOT DETECTED Final   Streptococcus agalactiae NOT DETECTED NOT DETECTED Final   Streptococcus pneumoniae NOT DETECTED NOT DETECTED Final   Streptococcus pyogenes NOT DETECTED NOT DETECTED Final   Acinetobacter baumannii NOT DETECTED NOT DETECTED Final   Enterobacteriaceae species DETECTED (A) NOT DETECTED Final    Comment: CRITICAL RESULT CALLED TO, READ BACK BY AND VERIFIED WITH: TELDRIN JAMES ON 06/28/16 AT 0926 QSD    Enterobacter cloacae complex NOT DETECTED NOT DETECTED Final   Escherichia coli NOT DETECTED NOT DETECTED Final   Klebsiella oxytoca NOT DETECTED NOT DETECTED Final   Klebsiella pneumoniae DETECTED (A) NOT DETECTED Final    Comment: CRITICAL RESULT CALLED TO, READ BACK BY AND VERIFIED  WITH: TELDRIN JAMES ON 06/28/16 AT 0926 QSD    Proteus species NOT DETECTED NOT DETECTED Final   Serratia marcescens NOT DETECTED NOT DETECTED Final   Carbapenem resistance NOT DETECTED NOT DETECTED Final   Haemophilus influenzae NOT DETECTED NOT DETECTED Final   Neisseria meningitidis NOT DETECTED NOT DETECTED Final   Pseudomonas aeruginosa NOT DETECTED NOT DETECTED Final   Candida albicans NOT DETECTED NOT DETECTED Final   Candida glabrata NOT DETECTED NOT DETECTED Final   Candida krusei NOT DETECTED NOT DETECTED Final   Candida parapsilosis NOT DETECTED NOT DETECTED Final   Candida tropicalis NOT DETECTED NOT DETECTED Final  MRSA PCR Screening     Status: None   Collection Time: 06/28/16  3:15 AM  Result Value Ref Range Status   MRSA by PCR NEGATIVE NEGATIVE Final    Comment:        The GeneXpert MRSA Assay (FDA approved for NASAL specimens only), is one component of a comprehensive MRSA colonization surveillance program. It is not intended to diagnose MRSA infection nor to guide or monitor treatment for MRSA infections.      Studies: No results found.  Scheduled Meds: . cefTRIAXone (ROCEPHIN)  IV  2 g Intravenous Q24H  . docusate sodium  100 mg Oral BID  . FLUoxetine  40 mg Oral Daily  . heparin  5,000 Units Subcutaneous Q8H  . mometasone-formoterol  2 puff Inhalation BID  . sodium chloride flush  3 mL Intravenous Q12H  . tamsulosin  0.4 mg Oral Daily   Continuous Infusions: . sodium chloride 50 mL/hr at 06/29/16 1358    Assessment/Plan:  1. Severe sepsis with Klebsiella growing in the blood This is due to urinary source, continue Rocephin 2. Nephrolithiasis. Status post ureteral stent by Dr. Erlene Quan urology.  3. Acute renal failure due to  to kidney stones. Renal function continues to improve 4. Hyponatremia likely secondary to dehydration. Improved with hydration 5. Acute respiratory failure with hypoxia. Due to acute on chronic COPD exasperation patient's  respiratory status is improved Miscellaneous heparin for DVT prophylaxisCode Status:     Code Status Orders        Start     Ordered   06/28/16 0326  Full code  Continuous     06/28/16 0325    Code Status History    Date Active Date Inactive Code Status Order ID Comments User Context   This patient has a current code status but no historical code status.     Disposition Plan: To be determined  Consultants:  Urology  Procedures:  Ureteral stent  Antibiotics:  Rocephin  Time spent: 35 minutes.Transferred to the floor [place], [facility]

## 2016-06-30 NOTE — Progress Notes (Signed)
[facility] PROGRESS NOTE  Edwin Donovan. ZCH:[id number] DOB: 01-19-1946 DOA: 06/27/2016 PCP: [facility]  HPI/Subjective: Patient reports that his breathing usually gets worse at nighttime. He denies any chest pains abdominal pain is resolved Objective: Vitals:   06/29/16 2059 06/30/16 0427  BP: 130/83 136/88  Pulse: 95 (!) 103  Resp: 20 20  Temp: 97.9 F (36.6 C) 98 F (36.7 C)    Filed Weights   06/28/16 0339 06/28/16 1147 06/30/16 0435  Weight: 135.4 kg (298 lb 8.1 oz) 135.2 kg (298 lb) 135.8 kg (299 lb 6.4 oz)    ROS: Review of Systems  Constitutional: Negative for chills and fever.  Eyes: Negative for blurred vision.  Respiratory: Positive for shortness of breath. Negative for cough.   Cardiovascular: Negative for chest pain.  Gastrointestinal: Negative for constipation, diarrhea, nausea and vomiting.  Genitourinary: Positive for flank pain. Negative for dysuria.  Musculoskeletal: Negative for joint pain.  Neurological: Negative for dizziness and headaches.   Exam: Physical Exam  Constitutional: He is oriented to person, place, and time.  HENT:  Nose: No mucosal edema.  Mouth/Throat: No oropharyngeal exudate or posterior oropharyngeal edema.  Eyes: Conjunctivae, EOM and lids are normal. Pupils are equal, round, and reactive to light.  Neck: No JVD present. Carotid bruit is not present. No edema present. No thyroid mass and no thyromegaly present.  Cardiovascular: S1 normal and S2 normal.  Exam reveals no gallop.   No murmur heard. Pulses:      Dorsalis pedis pulses are 2+ on the right side, and 2+ on the left side.  Respiratory: No respiratory distress. He has no wheezes. He has no rhonchi.  Decreased bs   GI: Soft. Bowel sounds are normal.  Musculoskeletal:       Right ankle: He exhibits swelling.       Left ankle: He exhibits swelling.  Lymphadenopathy:    He has no cervical adenopathy.  Neurological: He is alert and oriented  to person, place, and time. No cranial nerve deficit.  Skin: Skin is warm. No rash noted. Nails show no clubbing.  Psychiatric: He has a normal mood and affect.      Data Reviewed: Basic Metabolic Panel:  Recent Labs Lab 06/27/16 2122 06/29/16 0315 06/30/16 0553  NA 130* 134* 138  K 4.1 4.3 4.3  CL 97* 102 107  CO2 21* 24 26  GLUCOSE 115* 160* 122*  BUN 40* 57* 39*  CREATININE 2.47* 1.65* 0.95  CALCIUM 8.3* 7.6* 8.2*   Liver Function Tests:  Recent Labs Lab 06/27/16 2122  AST 50*  ALT 22  ALKPHOS 94  BILITOT 1.0  PROT 6.8  ALBUMIN 3.2*   CBC:  Recent Labs Lab 06/27/16 2122 06/29/16 0315 06/30/16 0553  WBC 2.2* 16.7* 14.6*  NEUTROABS 2.0  --   --   HGB 13.5 12.0* 12.8*  HCT 38.7* 34.9* 37.1*  MCV 92.6 94.1 93.6  PLT 86* 63* 57*   Cardiac Enzymes:  Recent Labs Lab 06/28/16 0245  TROPONINI 0.06*   BNP (last 3 results)  Recent Labs  06/27/16 2122  BNP 239.0*     CBG:  Recent Labs Lab 06/28/16 0316  GLUCAP 199*    Recent Results (from the past 240 hour(s))  Urine culture     Status: Abnormal (Preliminary result)   Collection Time: 06/27/16  9:21 PM  Result Value Ref Range Status   Specimen Description URINE, RANDOM  Final   Special Requests NONE  Final  Culture (A)  Final    >=100,000 COLONIES/mL GRAM NEGATIVE RODS IDENTIFICATION AND SUSCEPTIBILITIES TO FOLLOW Performed at [facility]    Report Status PENDING  Incomplete  Blood Culture (routine x 2)     Status: Abnormal   Collection Time: 06/27/16  9:22 PM  Result Value Ref Range Status   Specimen Description BLOOD RIGHT ANTECUBITAL  Final   Special Requests BOTTLES DRAWN AEROBIC AND ANAEROBIC 5CCAERO,4CCANA  Final   Culture  Setup Time   Final    GRAM NEGATIVE RODS IN BOTH AEROBIC AND ANAEROBIC BOTTLES CRITICAL VALUE NOTED.  VALUE IS CONSISTENT WITH PREVIOUSLY REPORTED AND CALLED VALUE.    Culture (A)  Final    KLEBSIELLA PNEUMONIAE SUSCEPTIBILITIES PERFORMED ON  PREVIOUS CULTURE WITHIN THE LAST 5 DAYS. Performed at [facility]    Report Status 06/30/2016 FINAL  Final  Blood Culture (routine x 2)     Status: Abnormal   Collection Time: 06/27/16  9:22 PM  Result Value Ref Range Status   Specimen Description BLOOD LEFT WRIST  Final   Special Requests BOTTLES DRAWN AEROBIC AND ANAEROBIC 5CCAERO,4CCANA  Final   Culture  Setup Time   Final    GRAM NEGATIVE RODS IN BOTH AEROBIC AND ANAEROBIC BOTTLES CRITICAL RESULT CALLED TO, READ BACK BY AND VERIFIED WITH: TELDRIN JAMES ON 06/28/16 AT 5784 QSD    Culture KLEBSIELLA PNEUMONIAE (A)  Final   Report Status 06/30/2016 FINAL  Final   Organism ID, Bacteria KLEBSIELLA PNEUMONIAE  Final      Susceptibility   Klebsiella pneumoniae - MIC*    AMPICILLIN >=32 RESISTANT Resistant     CEFAZOLIN <=4 SENSITIVE Sensitive     CEFEPIME <=1 SENSITIVE Sensitive     CEFTAZIDIME <=1 SENSITIVE Sensitive     CEFTRIAXONE <=1 SENSITIVE Sensitive     CIPROFLOXACIN <=0.25 SENSITIVE Sensitive     GENTAMICIN <=1 SENSITIVE Sensitive     IMIPENEM <=0.25 SENSITIVE Sensitive     TRIMETH/SULFA <=20 SENSITIVE Sensitive     AMPICILLIN/SULBACTAM 8 SENSITIVE Sensitive     PIP/TAZO 8 SENSITIVE Sensitive     Extended ESBL NEGATIVE Sensitive     * KLEBSIELLA PNEUMONIAE  Blood Culture ID Panel (Reflexed)     Status: Abnormal   Collection Time: 06/27/16  9:22 PM  Result Value Ref Range Status   Enterococcus species NOT DETECTED NOT DETECTED Final   Vancomycin resistance NOT DETECTED NOT DETECTED Final   Listeria monocytogenes NOT DETECTED NOT DETECTED Final   Staphylococcus species NOT DETECTED NOT DETECTED Final   Staphylococcus aureus NOT DETECTED NOT DETECTED Final   Methicillin resistance NOT DETECTED NOT DETECTED Final   Streptococcus species NOT DETECTED NOT DETECTED Final   Streptococcus agalactiae NOT DETECTED NOT DETECTED Final   Streptococcus pneumoniae NOT DETECTED NOT DETECTED Final   Streptococcus pyogenes NOT  DETECTED NOT DETECTED Final   Acinetobacter baumannii NOT DETECTED NOT DETECTED Final   Enterobacteriaceae species DETECTED (A) NOT DETECTED Final    Comment: CRITICAL RESULT CALLED TO, READ BACK BY AND VERIFIED WITH: TELDRIN JAMES ON 06/28/16 AT 0926 QSD    Enterobacter cloacae complex NOT DETECTED NOT DETECTED Final   Escherichia coli NOT DETECTED NOT DETECTED Final   Klebsiella oxytoca NOT DETECTED NOT DETECTED Final   Klebsiella pneumoniae DETECTED (A) NOT DETECTED Final    Comment: CRITICAL RESULT CALLED TO, READ BACK BY AND VERIFIED WITH: TELDRIN JAMES ON 06/28/16 AT 6962 QSD    Proteus species NOT DETECTED NOT DETECTED Final  Serratia marcescens NOT DETECTED NOT DETECTED Final   Carbapenem resistance NOT DETECTED NOT DETECTED Final   Haemophilus influenzae NOT DETECTED NOT DETECTED Final   Neisseria meningitidis NOT DETECTED NOT DETECTED Final   Pseudomonas aeruginosa NOT DETECTED NOT DETECTED Final   Candida albicans NOT DETECTED NOT DETECTED Final   Candida glabrata NOT DETECTED NOT DETECTED Final   Candida krusei NOT DETECTED NOT DETECTED Final   Candida parapsilosis NOT DETECTED NOT DETECTED Final   Candida tropicalis NOT DETECTED NOT DETECTED Final  MRSA PCR Screening     Status: None   Collection Time: 06/28/16  3:15 AM  Result Value Ref Range Status   MRSA by PCR NEGATIVE NEGATIVE Final    Comment:        The GeneXpert MRSA Assay (FDA approved for NASAL specimens only), is one component of a comprehensive MRSA colonization surveillance program. It is not intended to diagnose MRSA infection nor to guide or monitor treatment for MRSA infections.      Studies: Ct Angio Chest Pe W Or Wo Contrast  Result Date: 06/30/2016 CLINICAL DATA:  Respiratory failure EXAM: CT ANGIOGRAPHY CHEST WITH CONTRAST TECHNIQUE: Multidetector CT imaging of the chest was performed using the standard protocol during bolus administration of intravenous contrast. Multiplanar CT image  reconstructions and MIPs were obtained to evaluate the vascular anatomy. CONTRAST:  75 cc Isovue 370 COMPARISON:  06/27/2016 CT abdomen and pelvis FINDINGS: There are no filling defects in the pulmonary arterial tree. No abnormal mediastinal adenopathy. There are small scattered mediastinal nodes. Peribronchovascular soft tissue prominence in both lower hilar regions right greater than left. These findings may represent adenopathy or inflammatory changes of the central airways. Low-density posterior to the left atrial appendage cyst felt to represent physiologic pericardial fluid. Bibasilar dependent consolidation. There are patchy areas of ground-glass in the anterior upper lobes, posterior left upper lobe, and right middle lobe associated with interlobular septal thickening. These findings are most consistent with an inflammatory process. There is no evidence of aortic aneurysm or aortic dissection. Three vessel coronary artery calcification. Aortic arch atherosclerotic calcification. There is some smooth plaque in the innominate artery. Great vessels are grossly patent. Tiny pleural effusions bilaterally right greater than left. No vertebral compression.  Mild thoracic scoliosis. Review of the MIP images confirms the above findings. IMPRESSION: No evidence of acute pulmonary thromboembolism. Bibasilar consolidation. There are also ground-glass and heterogeneous opacities throughout the remainder of the gloves. Findings most likely represent an inflammatory process or pneumonia. Also consider pulmonary edema and ARDS. Possible bilateral hilar adenopathy. This may be related to the above mentioned volume overload or inflammatory process. Tiny pleural effusions. Electronically Signed   By: Marybelle Killings M.D.   On: 06/30/2016 10:36    Scheduled Meds: . cefTRIAXone (ROCEPHIN)  IV  2 g Intravenous Q24H  . docusate sodium  100 mg Oral BID  . FLUoxetine  40 mg Oral Daily  . mometasone-formoterol  2 puff Inhalation  BID  . sodium chloride flush  3 mL Intravenous Q12H  . tamsulosin  0.4 mg Oral Daily   Continuous Infusions:    Assessment/Plan:  1. Severe sepsis with Klebsiella growing in the blood This is due to urinary source, continue Rocephin 2. Nephrolithiasis. Status post ureteral stent by Dr. Erlene Quan urology.  3. Shortness of breath: I will order a CT per PE rule out PE as well as any other pathology, CT scan subsequently shows bilateral consolidation due to possible pneumonia I will add azithromycin to current regimen  he is artery on ceftriaxone 4. Acute renal failure due to  to kidney stones. Renal function now normal 5. Hyponatremia likely secondary to dehydration. Improved with hydration 6. Acute respiratory failure with hypoxia. Due to acute on chronic COPD exasperation as well as bilateral pneumonia Miscellaneous heparin for DVT prophylaxis   Code Status:     Code Status Orders        Start     Ordered   06/28/16 0326  Full code  Continuous     06/28/16 0325    Code Status History    Date Active Date Inactive Code Status Order ID Comments User Context   This patient has a current code status but no historical code status.     Disposition Plan: To be determined  Consultants:  Urology  Procedures:  Ureteral stent  Antibiotics:  Rocephin  Time spent: 32 minutes.Transferred to the floor [facility]

## 2016-06-30 NOTE — Progress Notes (Signed)
*  PRELIMINARY RESULTS* Echocardiogram 2D Echocardiogram has been performed.  Edwin Donovan 06/30/2016, 2:03 PM

## 2016-07-01 ENCOUNTER — Encounter: Payer: Self-pay | Admitting: Urology

## 2016-07-01 DIAGNOSIS — R52 Pain, unspecified: Secondary | ICD-10-CM

## 2016-07-01 DIAGNOSIS — R0602 Shortness of breath: Secondary | ICD-10-CM

## 2016-07-01 DIAGNOSIS — A419 Sepsis, unspecified organism: Secondary | ICD-10-CM

## 2016-07-01 LAB — URINE CULTURE: Culture: >=100000 — AB

## 2016-07-01 MED ORDER — GUAIFENESIN ER 600 MG PO TB12
600.0000 mg | ORAL_TABLET | Freq: Two times a day (BID) | ORAL | Status: DC
  Administered 2016-07-01 – 2016-07-04 (×6): 600 mg via ORAL
  Filled 2016-07-01 (×7): qty 1

## 2016-07-01 MED ORDER — METHYLPREDNISOLONE SODIUM SUCC 125 MG IJ SOLR
60.0000 mg | Freq: Four times a day (QID) | INTRAMUSCULAR | Status: DC
  Administered 2016-07-01 – 2016-07-03 (×9): 60 mg via INTRAVENOUS
  Filled 2016-07-01 (×9): qty 2

## 2016-07-01 MED ORDER — MINERAL OIL RE ENEM
1.0000 | ENEMA | Freq: Once | RECTAL | Status: AC
  Administered 2016-07-01: 1 via RECTAL

## 2016-07-01 NOTE — Progress Notes (Signed)
Urology Consult Follow Up  Subjective: Patient's only complaint today is constipation.  VSS afebrile.  Patient reports good UOP.  No stent discomfort.  Anti-infectives: Anti-infectives    Start     Dose/Rate Route Frequency Ordered Stop   06/30/16 1300  azithromycin (ZITHROMAX) 500 mg in dextrose 5 % 250 mL IVPB     500 mg 250 mL/hr over 60 Minutes Intravenous Every 24 hours 06/30/16 1137     06/28/16 1000  cefTRIAXone (ROCEPHIN) 2 g in dextrose 5 % 50 mL IVPB     2 g 100 mL/hr over 30 Minutes Intravenous Every 24 hours 06/28/16 0952     06/28/16 0215  vancomycin (VANCOCIN) IVPB 1000 mg/200 mL premix     1,000 mg 200 mL/hr over 60 Minutes Intravenous  Once 06/28/16 0202 06/28/16 0304   06/28/16 0145  vancomycin (VANCOCIN) 1,250 mg in sodium chloride 0.9 % 250 mL IVPB  Status:  Discontinued     1,250 mg 166.7 mL/hr over 90 Minutes Intravenous  Once 06/28/16 0135 06/28/16 0505   06/27/16 2315  piperacillin-tazobactam (ZOSYN) IVPB 3.375 g     3.375 g 100 mL/hr over 30 Minutes Intravenous  Once 06/27/16 2312 06/28/16 0004      Current Facility-Administered Medications  Medication Dose Route Frequency Provider Last Rate Last Dose  . acetaminophen (TYLENOL) tablet 650 mg  650 mg Oral Q6H PRN Harrie Foreman, MD       Or  . acetaminophen (TYLENOL) suppository 650 mg  650 mg Rectal Q6H PRN Harrie Foreman, MD      . albuterol (PROVENTIL) (2.5 MG/3ML) 0.083% nebulizer solution 3 mL  3 mL Inhalation Q6H PRN Harrie Foreman, MD   3 mL at 06/29/16 1823  . azithromycin (ZITHROMAX) 500 mg in dextrose 5 % 250 mL IVPB  500 mg Intravenous Q24H Dustin Flock, MD   500 mg at 06/30/16 1214  . cefTRIAXone (ROCEPHIN) 2 g in dextrose 5 % 50 mL IVPB  2 g Intravenous Q24H Flora Lipps, MD   2 g at 07/01/16 1106  . docusate sodium (COLACE) capsule 100 mg  100 mg Oral BID Harrie Foreman, MD   100 mg at 07/01/16 1108  . FLUoxetine (PROZAC) capsule 40 mg  40 mg Oral Daily Harrie Foreman, MD   40 mg  at 07/01/16 1108  . guaiFENesin (MUCINEX) 12 hr tablet 600 mg  600 mg Oral BID Dustin Flock, MD   600 mg at 07/01/16 1108  . methylPREDNISolone sodium succinate (SOLU-MEDROL) 125 mg/2 mL injection 60 mg  60 mg Intravenous Q6H Dustin Flock, MD   60 mg at 07/01/16 1107  . mometasone-formoterol (DULERA) 200-5 MCG/ACT inhaler 2 puff  2 puff Inhalation BID Harrie Foreman, MD   2 puff at 07/01/16 1108  . morphine 2 MG/ML injection 2 mg  2 mg Intravenous Q4H PRN Lance Coon, MD   2 mg at 07/01/16 0759  . ondansetron (ZOFRAN) tablet 4 mg  4 mg Oral Q6H PRN Harrie Foreman, MD       Or  . ondansetron [facility]) injection 4 mg  4 mg Intravenous Q6H PRN Harrie Foreman, MD      . oxyCODONE (Oxy IR/ROXICODONE) immediate release tablet 5 mg  5 mg Oral Q4H PRN Harrie Foreman, MD   5 mg at 07/01/16 2355  . sodium chloride flush (NS) 0.9 % injection 3 mL  3 mL Intravenous Q12H Harrie Foreman, MD   3 mL at 07/01/16  1119  . tamsulosin (FLOMAX) capsule 0.4 mg  0.4 mg Oral Daily Harrie Foreman, MD   0.4 mg at 07/01/16 1107  . traZODone (DESYREL) tablet 100 mg  100 mg Oral QHS PRN Lytle Butte, MD   100 mg at 06/29/16 2324     Objective: Vital signs in last 24 hours: Temp:  [98.2 F (36.8 C)] 98.2 F (36.8 C) (08/14 0503) Pulse Rate:  [88-97] 88 (08/14 0503) Resp:  [18-19] 18 (08/14 0503) BP: (143-162)/(95-99) 162/95 (08/14 0503) SpO2:  [93 %-95 %] 95 % (08/14 0503) Weight:  [294 lb (133.4 kg)] 294 lb (133.4 kg) (08/14 0619)  Intake/Output from previous day: 08/13 0701 - 08/14 0700 In: 300 [IV Piggyback:300] Out: 4525 [Urine:4525] Intake/Output this shift: Total I/O In: 480 [P.O.:480] Out: -    Physical Exam Constitutional: Well nourished. Alert and oriented, No acute distress. HEENT: [place] AT, moist mucus membranes. Trachea midline, no masses. Cardiovascular: No clubbing, cyanosis, or edema. Respiratory: Normal respiratory effort, no increased work of breathing. GI: Abdomen is  soft, non tender, non distended, no abdominal masses. Liver and spleen not palpable.  No hernias appreciated.  Stool sample for occult testing is not indicated.   GU: No CVA tenderness.  No bladder fullness or masses.   Skin: No rashes, bruises or suspicious lesions. Lymph: No cervical or inguinal adenopathy. Neurologic: Grossly intact, no focal deficits, moving all 4 extremities. Psychiatric: Normal mood and affect.  Lab Results:   Recent Labs  06/29/16 0315 06/30/16 0553  WBC 16.7* 14.6*  HGB 12.0* 12.8*  HCT 34.9* 37.1*  PLT 63* 57*   BMET  Recent Labs  06/29/16 0315 06/30/16 0553  NA 134* 138  K 4.3 4.3  CL 102 107  CO2 24 26  GLUCOSE 160* 122*  BUN 57* 39*  CREATININE 1.65* 0.95  CALCIUM 7.6* 8.2*   PT/INR No results for input(s): LABPROT, INR in the last 72 hours. ABG No results for input(s): PHART, HCO3 in the last 72 hours.  Invalid input(s): PCO2, PO2  Studies/Results: Ct Angio Chest Pe W Or Wo Contrast  Result Date: 06/30/2016 CLINICAL DATA:  Respiratory failure EXAM: CT ANGIOGRAPHY CHEST WITH CONTRAST TECHNIQUE: Multidetector CT imaging of the chest was performed using the standard protocol during bolus administration of intravenous contrast. Multiplanar CT image reconstructions and MIPs were obtained to evaluate the vascular anatomy. CONTRAST:  75 cc Isovue 370 COMPARISON:  06/27/2016 CT abdomen and pelvis FINDINGS: There are no filling defects in the pulmonary arterial tree. No abnormal mediastinal adenopathy. There are small scattered mediastinal nodes. Peribronchovascular soft tissue prominence in both lower hilar regions right greater than left. These findings may represent adenopathy or inflammatory changes of the central airways. Low-density posterior to the left atrial appendage cyst felt to represent physiologic pericardial fluid. Bibasilar dependent consolidation. There are patchy areas of ground-glass in the anterior upper lobes, posterior left upper  lobe, and right middle lobe associated with interlobular septal thickening. These findings are most consistent with an inflammatory process. There is no evidence of aortic aneurysm or aortic dissection. Three vessel coronary artery calcification. Aortic arch atherosclerotic calcification. There is some smooth plaque in the innominate artery. Great vessels are grossly patent. Tiny pleural effusions bilaterally right greater than left. No vertebral compression.  Mild thoracic scoliosis. Review of the MIP images confirms the above findings. IMPRESSION: No evidence of acute pulmonary thromboembolism. Bibasilar consolidation. There are also ground-glass and heterogeneous opacities throughout the remainder of the gloves. Findings most likely represent  an inflammatory process or pneumonia. Also consider pulmonary edema and ARDS. Possible bilateral hilar adenopathy. This may be related to the above mentioned volume overload or inflammatory process. Tiny pleural effusions. Electronically Signed   By: Marybelle Killings M.D.   On: 06/30/2016 10:36    Impression/Assessment: Patient underwent emergent right ureteral stent placement on 06/28/2016 for a 6 mm right UVJ stone causing bacteremia/sepsis.     Blood cultures positive for enterobacteriaceae and Klebsiella pneumoniae.  Urine culture is positive for Klebsiella pneumoniae.    Leukocytosis is improving.  Creatinine greatly improved.  Patient is again reminded that he has an indwelling ureteral stent and he will need follow up for its removal and definitive treatment for his stone.  He is agreeable.    He would like to follow up with [facility] upon discharge.  Continue tamsulosin.  Would continue oral antibiotics for one week after discharge.  Okay to discontinue the foley at this time.    Urology signing off at this time.     LOS: 3 days    [facility] 07/01/2016

## 2016-07-01 NOTE — Progress Notes (Signed)
[facility] PROGRESS NOTE  Edwin Donovan. DJM:[id number] DOB: 04/19/1946 DOA: 06/27/2016 PCP: [facility]  HPI/Subjective: Continues to complain of shortness of breath especially at night. Complains of wheezing. CT scan yesterday showed bilateral pneumonia Objective: Vitals:   06/30/16 2203 07/01/16 0503  BP: (!) 143/99 (!) 162/95  Pulse: 97 88  Resp: 19 18  Temp: 98.2 F (36.8 C) 98.2 F (36.8 C)    Filed Weights   06/28/16 1147 06/30/16 0435 07/01/16 0619  Weight: 135.2 kg (298 lb) 135.8 kg (299 lb 6.4 oz) 133.4 kg (294 lb)    ROS: Review of Systems  Constitutional: Negative for chills and fever.  Eyes: Negative for blurred vision.  Respiratory: Positive for shortness of breath. Negative for cough.   Cardiovascular: Negative for chest pain.  Gastrointestinal: Negative for constipation, diarrhea, nausea and vomiting.  Genitourinary: Positive for flank pain. Negative for dysuria.  Musculoskeletal: Negative for joint pain.  Neurological: Negative for dizziness and headaches.   Exam: Physical Exam  Constitutional: He is oriented to person, place, and time.  HENT:  Nose: No mucosal edema.  Mouth/Throat: No oropharyngeal exudate or posterior oropharyngeal edema.  Eyes: Conjunctivae, EOM and lids are normal. Pupils are equal, round, and reactive to light.  Neck: No JVD present. Carotid bruit is not present. No edema present. No thyroid mass and no thyromegaly present.  Cardiovascular: S1 normal and S2 normal.  Exam reveals no gallop.   No murmur heard. Pulses:      Dorsalis pedis pulses are 2+ on the right side, and 2+ on the left side.  Respiratory: No respiratory distress. He has no wheezes. He has no rhonchi.  Decreased bs   GI: Soft. Bowel sounds are normal.  Musculoskeletal:       Right ankle: He exhibits swelling.       Left ankle: He exhibits swelling.  Lymphadenopathy:    He has no cervical adenopathy.  Neurological: He is alert  and oriented to person, place, and time. No cranial nerve deficit.  Skin: Skin is warm. No rash noted. Nails show no clubbing.  Psychiatric: He has a normal mood and affect.      Data Reviewed: Basic Metabolic Panel:  Recent Labs Lab 06/27/16 2122 06/29/16 0315 06/30/16 0553  NA 130* 134* 138  K 4.1 4.3 4.3  CL 97* 102 107  CO2 21* 24 26  GLUCOSE 115* 160* 122*  BUN 40* 57* 39*  CREATININE 2.47* 1.65* 0.95  CALCIUM 8.3* 7.6* 8.2*   Liver Function Tests:  Recent Labs Lab 06/27/16 2122  AST 50*  ALT 22  ALKPHOS 94  BILITOT 1.0  PROT 6.8  ALBUMIN 3.2*   CBC:  Recent Labs Lab 06/27/16 2122 06/29/16 0315 06/30/16 0553  WBC 2.2* 16.7* 14.6*  NEUTROABS 2.0  --   --   HGB 13.5 12.0* 12.8*  HCT 38.7* 34.9* 37.1*  MCV 92.6 94.1 93.6  PLT 86* 63* 57*   Cardiac Enzymes:  Recent Labs Lab 06/28/16 0245  TROPONINI 0.06*   BNP (last 3 results)  Recent Labs  06/27/16 2122  BNP 239.0*     CBG:  Recent Labs Lab 06/28/16 0316  GLUCAP 199*    Recent Results (from the past 240 hour(s))  Urine culture     Status: Abnormal   Collection Time: 06/27/16  9:21 PM  Result Value Ref Range Status   Specimen Description URINE, RANDOM  Final   Special Requests NONE  Final   Culture >=100,000  COLONIES/mL KLEBSIELLA PNEUMONIAE (A)  Final   Report Status 07/01/2016 FINAL  Final   Organism ID, Bacteria KLEBSIELLA PNEUMONIAE (A)  Final      Susceptibility   Klebsiella pneumoniae - MIC*    AMPICILLIN 16 RESISTANT Resistant     CEFAZOLIN <=4 SENSITIVE Sensitive     CEFTRIAXONE <=1 SENSITIVE Sensitive     CIPROFLOXACIN <=0.25 SENSITIVE Sensitive     GENTAMICIN <=1 SENSITIVE Sensitive     IMIPENEM <=0.25 SENSITIVE Sensitive     NITROFURANTOIN 64 INTERMEDIATE Intermediate     TRIMETH/SULFA <=20 SENSITIVE Sensitive     AMPICILLIN/SULBACTAM 4 SENSITIVE Sensitive     PIP/TAZO <=4 SENSITIVE Sensitive     Extended ESBL NEGATIVE Sensitive     * >=100,000 COLONIES/mL  KLEBSIELLA PNEUMONIAE  Blood Culture (routine x 2)     Status: Abnormal   Collection Time: 06/27/16  9:22 PM  Result Value Ref Range Status   Specimen Description BLOOD RIGHT ANTECUBITAL  Final   Special Requests BOTTLES DRAWN AEROBIC AND ANAEROBIC 5CCAERO,4CCANA  Final   Culture  Setup Time   Final    GRAM NEGATIVE RODS IN BOTH AEROBIC AND ANAEROBIC BOTTLES CRITICAL VALUE NOTED.  VALUE IS CONSISTENT WITH PREVIOUSLY REPORTED AND CALLED VALUE.    Culture (A)  Final    KLEBSIELLA PNEUMONIAE SUSCEPTIBILITIES PERFORMED ON PREVIOUS CULTURE WITHIN THE LAST 5 DAYS. Performed at [facility]    Report Status 06/30/2016 FINAL  Final  Blood Culture (routine x 2)     Status: Abnormal   Collection Time: 06/27/16  9:22 PM  Result Value Ref Range Status   Specimen Description BLOOD LEFT WRIST  Final   Special Requests BOTTLES DRAWN AEROBIC AND ANAEROBIC 5CCAERO,4CCANA  Final   Culture  Setup Time   Final    GRAM NEGATIVE RODS IN BOTH AEROBIC AND ANAEROBIC BOTTLES CRITICAL RESULT CALLED TO, READ BACK BY AND VERIFIED WITH: TELDRIN JAMES ON 06/28/16 AT 6213 QSD    Culture KLEBSIELLA PNEUMONIAE (A)  Final   Report Status 06/30/2016 FINAL  Final   Organism ID, Bacteria KLEBSIELLA PNEUMONIAE  Final      Susceptibility   Klebsiella pneumoniae - MIC*    AMPICILLIN >=32 RESISTANT Resistant     CEFAZOLIN <=4 SENSITIVE Sensitive     CEFEPIME <=1 SENSITIVE Sensitive     CEFTAZIDIME <=1 SENSITIVE Sensitive     CEFTRIAXONE <=1 SENSITIVE Sensitive     CIPROFLOXACIN <=0.25 SENSITIVE Sensitive     GENTAMICIN <=1 SENSITIVE Sensitive     IMIPENEM <=0.25 SENSITIVE Sensitive     TRIMETH/SULFA <=20 SENSITIVE Sensitive     AMPICILLIN/SULBACTAM 8 SENSITIVE Sensitive     PIP/TAZO 8 SENSITIVE Sensitive     Extended ESBL NEGATIVE Sensitive     * KLEBSIELLA PNEUMONIAE  Blood Culture ID Panel (Reflexed)     Status: Abnormal   Collection Time: 06/27/16  9:22 PM  Result Value Ref Range Status   Enterococcus  species NOT DETECTED NOT DETECTED Final   Vancomycin resistance NOT DETECTED NOT DETECTED Final   Listeria monocytogenes NOT DETECTED NOT DETECTED Final   Staphylococcus species NOT DETECTED NOT DETECTED Final   Staphylococcus aureus NOT DETECTED NOT DETECTED Final   Methicillin resistance NOT DETECTED NOT DETECTED Final   Streptococcus species NOT DETECTED NOT DETECTED Final   Streptococcus agalactiae NOT DETECTED NOT DETECTED Final   Streptococcus pneumoniae NOT DETECTED NOT DETECTED Final   Streptococcus pyogenes NOT DETECTED NOT DETECTED Final   Acinetobacter baumannii NOT DETECTED NOT DETECTED  Final   Enterobacteriaceae species DETECTED (A) NOT DETECTED Final    Comment: CRITICAL RESULT CALLED TO, READ BACK BY AND VERIFIED WITH: TELDRIN JAMES ON 06/28/16 AT 0926 QSD    Enterobacter cloacae complex NOT DETECTED NOT DETECTED Final   Escherichia coli NOT DETECTED NOT DETECTED Final   Klebsiella oxytoca NOT DETECTED NOT DETECTED Final   Klebsiella pneumoniae DETECTED (A) NOT DETECTED Final    Comment: CRITICAL RESULT CALLED TO, READ BACK BY AND VERIFIED WITH: TELDRIN JAMES ON 06/28/16 AT 0926 QSD    Proteus species NOT DETECTED NOT DETECTED Final   Serratia marcescens NOT DETECTED NOT DETECTED Final   Carbapenem resistance NOT DETECTED NOT DETECTED Final   Haemophilus influenzae NOT DETECTED NOT DETECTED Final   Neisseria meningitidis NOT DETECTED NOT DETECTED Final   Pseudomonas aeruginosa NOT DETECTED NOT DETECTED Final   Candida albicans NOT DETECTED NOT DETECTED Final   Candida glabrata NOT DETECTED NOT DETECTED Final   Candida krusei NOT DETECTED NOT DETECTED Final   Candida parapsilosis NOT DETECTED NOT DETECTED Final   Candida tropicalis NOT DETECTED NOT DETECTED Final  MRSA PCR Screening     Status: None   Collection Time: 06/28/16  3:15 AM  Result Value Ref Range Status   MRSA by PCR NEGATIVE NEGATIVE Final    Comment:        The GeneXpert MRSA Assay (FDA approved for  NASAL specimens only), is one component of a comprehensive MRSA colonization surveillance program. It is not intended to diagnose MRSA infection nor to guide or monitor treatment for MRSA infections.      Studies: Ct Angio Chest Pe W Or Wo Contrast  Result Date: 06/30/2016 CLINICAL DATA:  Respiratory failure EXAM: CT ANGIOGRAPHY CHEST WITH CONTRAST TECHNIQUE: Multidetector CT imaging of the chest was performed using the standard protocol during bolus administration of intravenous contrast. Multiplanar CT image reconstructions and MIPs were obtained to evaluate the vascular anatomy. CONTRAST:  75 cc Isovue 370 COMPARISON:  06/27/2016 CT abdomen and pelvis FINDINGS: There are no filling defects in the pulmonary arterial tree. No abnormal mediastinal adenopathy. There are small scattered mediastinal nodes. Peribronchovascular soft tissue prominence in both lower hilar regions right greater than left. These findings may represent adenopathy or inflammatory changes of the central airways. Low-density posterior to the left atrial appendage cyst felt to represent physiologic pericardial fluid. Bibasilar dependent consolidation. There are patchy areas of ground-glass in the anterior upper lobes, posterior left upper lobe, and right middle lobe associated with interlobular septal thickening. These findings are most consistent with an inflammatory process. There is no evidence of aortic aneurysm or aortic dissection. Three vessel coronary artery calcification. Aortic arch atherosclerotic calcification. There is some smooth plaque in the innominate artery. Great vessels are grossly patent. Tiny pleural effusions bilaterally right greater than left. No vertebral compression.  Mild thoracic scoliosis. Review of the MIP images confirms the above findings. IMPRESSION: No evidence of acute pulmonary thromboembolism. Bibasilar consolidation. There are also ground-glass and heterogeneous opacities throughout the  remainder of the gloves. Findings most likely represent an inflammatory process or pneumonia. Also consider pulmonary edema and ARDS. Possible bilateral hilar adenopathy. This may be related to the above mentioned volume overload or inflammatory process. Tiny pleural effusions. Electronically Signed   By: Marybelle Killings M.D.   On: 06/30/2016 10:36    Scheduled Meds: . azithromycin  500 mg Intravenous Q24H  . cefTRIAXone (ROCEPHIN)  IV  2 g Intravenous Q24H  . docusate sodium  100 mg  Oral BID  . FLUoxetine  40 mg Oral Daily  . guaiFENesin  600 mg Oral BID  . methylPREDNISolone (SOLU-MEDROL) injection  60 mg Intravenous Q6H  . mometasone-formoterol  2 puff Inhalation BID  . sodium chloride flush  3 mL Intravenous Q12H  . tamsulosin  0.4 mg Oral Daily   Continuous Infusions:    Assessment/Plan:  1. Severe sepsis with Klebsiella growing in the blood This is due to urinary source, continue Rocephin 2. Nephrolithiasis. Status post ureteral stent by Dr. Erlene Quan urologyWill remove Foley.  3. Bilateral pneumonia: Continue ceftriaxone azithromycin 4. Acute renal failure due to  to kidney stones. Renal function now normal 5. Hyponatremia likely secondary to dehydration. Improved with hydration 6. Acute respiratory failure with hypoxia. Due to acute on chronic COPD exasperation as well as bilateral pneumonia Patient with some wheezing and I will add Solu-Medrol to his current regimen   Code Status:     Code Status Orders        Start     Ordered   06/28/16 0326  Full code  Continuous     06/28/16 0325    Code Status History    Date Active Date Inactive Code Status Order ID Comments User Context   This patient has a current code status but no historical code status.     Disposition Plan: To be determined  Consultants:  Urology  Procedures:  Ureteral stent  Antibiotics:  Rocephin  Time spent: 32 minutes.Transferred to the floor [place], [place]  [facility]

## 2016-07-02 ENCOUNTER — Telehealth: Payer: Self-pay | Admitting: Urology

## 2016-07-02 LAB — CBC WITH DIFFERENTIAL/PLATELET
Basophils Absolute: 0 10*3/uL (ref 0–0.1)
Basophils Relative: 0 %
Eosinophils Absolute: 0 10*3/uL (ref 0–0.7)
HCT: 41.9 % (ref 40.0–52.0)
Hemoglobin: 14.2 g/dL (ref 13.0–18.0)
LYMPHS ABS: 0.4 10*3/uL — AB (ref 1.0–3.6)
MCH: 31.4 pg (ref 26.0–34.0)
MCHC: 33.8 g/dL (ref 32.0–36.0)
MCV: 92.8 fL (ref 80.0–100.0)
MONO ABS: 0.2 10*3/uL (ref 0.2–1.0)
Monocytes Relative: 2 %
Neutro Abs: 12 10*3/uL — ABNORMAL HIGH (ref 1.4–6.5)
Neutrophils Relative %: 95 %
PLATELETS: 95 10*3/uL — AB (ref 150–440)
RBC: 4.51 MIL/uL (ref 4.40–5.90)
RDW: 14.4 % (ref 11.5–14.5)
WBC: 12.7 10*3/uL — ABNORMAL HIGH (ref 3.8–10.6)

## 2016-07-02 LAB — BASIC METABOLIC PANEL
Anion gap: 8 (ref 5–15)
BUN: 33 mg/dL — AB (ref 6–20)
CALCIUM: 8.7 mg/dL — AB (ref 8.9–10.3)
CO2: 23 mmol/L (ref 22–32)
Chloride: 101 mmol/L (ref 101–111)
Creatinine, Ser: 0.99 mg/dL (ref 0.61–1.24)
GFR calc Af Amer: >60 mL/min (ref >60)
GLUCOSE: 210 mg/dL — AB (ref 65–99)
Potassium: 4.4 mmol/L (ref 3.5–5.1)
Sodium: 132 mmol/L — ABNORMAL LOW (ref 135–145)

## 2016-07-02 MED ORDER — AMLODIPINE BESYLATE 10 MG PO TABS
10.0000 mg | ORAL_TABLET | Freq: Every day | ORAL | Status: DC
  Administered 2016-07-02 – 2016-07-04 (×3): 10 mg via ORAL
  Filled 2016-07-02 (×3): qty 1

## 2016-07-02 NOTE — Telephone Encounter (Signed)
Made appt with dr. Pilar Jarvis for 07-18-16   [facility]

## 2016-07-02 NOTE — Evaluation (Signed)
Physical Therapy Evaluation Patient Details Name: Edwin Donovan. MRN: [id number] DOB: 12/26/1945 Today's Date: 07/02/2016   History of Present Illness  Jaykwon Morones" Lussier is a 70yo white male who comes to [facility] on 8/11 c SOB, chills, and body aches; pt found to have AKI c hydronephrosis, in sepsis with klebsiella in blood, and bli PNA. PMH: COPD not on home O2, HTN. At baseline, pt performs limited community distances with intermittent AD use, and has a power chair for community distances and IADL. Daughter assists with IADL, and wife is a LTC resident at [facility] due to CVA.   Clinical Impression  Upon entry, the patient is received semirecumbent in bed, no family/caregiver present. The pt is awake and agreeable to participate. The pt is alert pleasant, conversational, and following simple and multi-step commands consistently. Pt received on 3L O2, and trialed on RA throughout evaluation, with noted desaturation to >91%, whereas pt is not on O2 at baseline at home. Functional mobility assessment demonstrates mild-moderate weakness, the pt now requiring additional effort/time for bed mobility, transfers, and gait, whereas the patient performs these independently at baseline. The pt estimates his strength to be 25% of his baseline. The patient is at high risk for falls as evidence by gait speed <1.15ms, forward reach <5", and falls anxiety demonstrated throughout session, however the patient appears to exhibit good awareness of functional limitations.   Patient presenting with impairment of strength, range of motion, O2 perfusion, balance, and activity tolerance, limiting ability to perform ADL and mobility tasks at  baseline level of function. Patient will benefit from skilled intervention to address the above impairments and limitations, in order to restore to prior level of function, improve patient safety upon discharge, and to decrease falls risk.       Follow Up  Recommendations Home health PT (Pt would benefit from STR stay, but is strongly opposed to this. No safety concerns with DC to home. )    Equipment Recommendations  Rolling walker with 5" wheels    Recommendations for Other Services       Precautions / Restrictions Precautions Precautions: Fall Restrictions Weight Bearing Restrictions: No      Mobility  Bed Mobility Overal bed mobility: Modified Independent             General bed mobility comments: additional time/effort only  Transfers Overall transfer level: Needs assistance Equipment used: None Transfers: Sit to/from Stand Sit to Stand: Supervision         General transfer comment: additional time/effort only  Ambulation/Gait Ambulation/Gait assistance: Supervision Ambulation Distance (Feet): 30 Feet Assistive device:  (IV pole) Gait Pattern/deviations: Wide base of support Gait velocity: AMB 3' requires almost 20 seconds. Steps are ~2-3inches bil.  Gait velocity interpretation: <1.8 ft/sec, indicative of risk for recurrent falls General Gait Details: Very weak slow; performed on RA, with DOE increasing, but SaO2: 91%  Stairs            Wheelchair Mobility    Modified Rankin (Stroke Patients Only)       Balance Overall balance assessment: Modified Independent                                           Pertinent Vitals/Pain Pain Assessment:  (general achiness in legs, bilat ant thighs, worse with prolonged bed rest this admission. )    Home Living Family/patient expects to  be discharged to:: Private residence Living Arrangements: Alone Available Help at Discharge: Family Type of Home: House Home Access: Stairs to enter   [employer] of Steps: 2 partial steps with rail Home Layout: Able to live on main level with bedroom/bathroom;Two level Home Equipment: Cane - single point;Shower seat;Wheelchair - power      Prior Function           Comments:  Limited tolerance to community distance AMB; needs assistance with IADL/groceries/ home care     Hand Dominance        Extremity/Trunk Assessment                         Communication   Communication: No difficulties  Cognition Arousal/Alertness: Awake/alert Behavior During Therapy: WFL for tasks assessed/performed Overall Cognitive Status: Within Functional Limits for tasks assessed                      General Comments      Exercises        Assessment/Plan    PT Assessment Patient needs continued PT services  PT Diagnosis Difficulty walking;Abnormality of gait;Generalized weakness   PT Problem List Decreased strength;Decreased coordination;Decreased range of motion;Decreased activity tolerance;Decreased balance;Decreased mobility;Decreased knowledge of precautions;Obesity  PT Treatment Interventions DME instruction;Gait training;Functional mobility training;Therapeutic activities;Patient/family education;Therapeutic exercise;Balance training   PT Goals (Current goals can be found in the Care Plan section) Acute Rehab PT Goals Patient Stated Goal: Regain strength and function PT Goal Formulation: With patient Time For Goal Achievement: 07/16/16 Potential to Achieve Goals: Good    Frequency Min 2X/week   Barriers to discharge        Co-evaluation               End of Session Equipment Utilized During Treatment: Oxygen Activity Tolerance: Patient tolerated treatment well;Patient limited by fatigue Patient left: in chair;with call bell/phone within reach;with chair alarm set Nurse Communication: Mobility status;Other (comment)         Time: 7076-1518 PT Time Calculation (min) (ACUTE ONLY): 27 min   Charges:   PT Evaluation $PT Eval Moderate Complexity: 1 Procedure PT Treatments $Therapeutic Activity: 8-22 mins   PT G Codes:        2:37 PM, 07-06-2016 Etta Grandchild, PT, DPT Physical Therapist - [place] [phone]  (mobile)

## 2016-07-02 NOTE — Progress Notes (Signed)
[facility] PROGRESS NOTE  Edwin Donovan. TKW:[id number] DOB: 01-31-1946 DOA: 06/27/2016 PCP: [place]  HPI/Subjective: Patient's breathing is improved. Complains of feeling very weak but no chest pains   Objective: Vitals:   07/02/16 1251 07/02/16 1252  BP: 134/76   Pulse: 88 88  Resp: 20   Temp: 97.6 F (36.4 C)     Filed Weights   06/28/16 1147 06/30/16 0435 07/01/16 0619  Weight: 135.2 kg (298 lb) 135.8 kg (299 lb 6.4 oz) 133.4 kg (294 lb)    ROS: Review of Systems  Constitutional: Negative for chills and fever.  Eyes: Negative for blurred vision.  Respiratory: Positive for shortness of breath. Negative for cough.   Cardiovascular: Negative for chest pain.  Gastrointestinal: Negative for constipation, diarrhea, nausea and vomiting.  Genitourinary: Negative for dysuria and flank pain.  Musculoskeletal: Negative for joint pain.  Neurological: Negative for dizziness and headaches.   Exam: Physical Exam  Constitutional: He is oriented to person, place, and time.  HENT:  Nose: No mucosal edema.  Mouth/Throat: No oropharyngeal exudate or posterior oropharyngeal edema.  Eyes: Conjunctivae, EOM and lids are normal. Pupils are equal, round, and reactive to light.  Neck: No JVD present. Carotid bruit is not present. No edema present. No thyroid mass and no thyromegaly present.  Cardiovascular: S1 normal and S2 normal.  Exam reveals no gallop.   No murmur heard. Pulses:      Dorsalis pedis pulses are 2+ on the right side, and 2+ on the left side.  Respiratory: No respiratory distress. He has no wheezes. He has no rhonchi.  Decreased bs   GI: Soft. Bowel sounds are normal.  Musculoskeletal:       Right ankle: He exhibits swelling.       Left ankle: He exhibits swelling.  Lymphadenopathy:    He has no cervical adenopathy.  Neurological: He is alert and oriented to person, place, and time. No cranial nerve deficit.  Skin: Skin is warm. No  rash noted. Nails show no clubbing.  Psychiatric: He has a normal mood and affect.      Data Reviewed: Basic Metabolic Panel:  Recent Labs Lab 06/27/16 2122 06/29/16 0315 06/30/16 0553  NA 130* 134* 138  K 4.1 4.3 4.3  CL 97* 102 107  CO2 21* 24 26  GLUCOSE 115* 160* 122*  BUN 40* 57* 39*  CREATININE 2.47* 1.65* 0.95  CALCIUM 8.3* 7.6* 8.2*   Liver Function Tests:  Recent Labs Lab 06/27/16 2122  AST 50*  ALT 22  ALKPHOS 94  BILITOT 1.0  PROT 6.8  ALBUMIN 3.2*   CBC:  Recent Labs Lab 06/27/16 2122 06/29/16 0315 06/30/16 0553  WBC 2.2* 16.7* 14.6*  NEUTROABS 2.0  --   --   HGB 13.5 12.0* 12.8*  HCT 38.7* 34.9* 37.1*  MCV 92.6 94.1 93.6  PLT 86* 63* 57*   Cardiac Enzymes:  Recent Labs Lab 06/28/16 0245  TROPONINI 0.06*   BNP (last 3 results)  Recent Labs  06/27/16 2122  BNP 239.0*     CBG:  Recent Labs Lab 06/28/16 0316  GLUCAP 199*    Recent Results (from the past 240 hour(s))  Urine culture     Status: Abnormal   Collection Time: 06/27/16  9:21 PM  Result Value Ref Range Status   Specimen Description URINE, RANDOM  Final   Special Requests NONE  Final   Culture >=100,000 COLONIES/mL KLEBSIELLA PNEUMONIAE (A)  Final   Report Status  07/01/2016 FINAL  Final   Organism ID, Bacteria KLEBSIELLA PNEUMONIAE (A)  Final      Susceptibility   Klebsiella pneumoniae - MIC*    AMPICILLIN 16 RESISTANT Resistant     CEFAZOLIN <=4 SENSITIVE Sensitive     CEFTRIAXONE <=1 SENSITIVE Sensitive     CIPROFLOXACIN <=0.25 SENSITIVE Sensitive     GENTAMICIN <=1 SENSITIVE Sensitive     IMIPENEM <=0.25 SENSITIVE Sensitive     NITROFURANTOIN 64 INTERMEDIATE Intermediate     TRIMETH/SULFA <=20 SENSITIVE Sensitive     AMPICILLIN/SULBACTAM 4 SENSITIVE Sensitive     PIP/TAZO <=4 SENSITIVE Sensitive     Extended ESBL NEGATIVE Sensitive     * >=100,000 COLONIES/mL KLEBSIELLA PNEUMONIAE  Blood Culture (routine x 2)     Status: Abnormal   Collection Time:  06/27/16  9:22 PM  Result Value Ref Range Status   Specimen Description BLOOD RIGHT ANTECUBITAL  Final   Special Requests BOTTLES DRAWN AEROBIC AND ANAEROBIC 5CCAERO,4CCANA  Final   Culture  Setup Time   Final    GRAM NEGATIVE RODS IN BOTH AEROBIC AND ANAEROBIC BOTTLES CRITICAL VALUE NOTED.  VALUE IS CONSISTENT WITH PREVIOUSLY REPORTED AND CALLED VALUE.    Culture (A)  Final    KLEBSIELLA PNEUMONIAE SUSCEPTIBILITIES PERFORMED ON PREVIOUS CULTURE WITHIN THE LAST 5 DAYS. Performed at [facility]    Report Status 06/30/2016 FINAL  Final  Blood Culture (routine x 2)     Status: Abnormal   Collection Time: 06/27/16  9:22 PM  Result Value Ref Range Status   Specimen Description BLOOD LEFT WRIST  Final   Special Requests BOTTLES DRAWN AEROBIC AND ANAEROBIC 5CCAERO,4CCANA  Final   Culture  Setup Time   Final    GRAM NEGATIVE RODS IN BOTH AEROBIC AND ANAEROBIC BOTTLES CRITICAL RESULT CALLED TO, READ BACK BY AND VERIFIED WITH: TELDRIN JAMES ON 06/28/16 AT 2409 QSD    Culture KLEBSIELLA PNEUMONIAE (A)  Final   Report Status 06/30/2016 FINAL  Final   Organism ID, Bacteria KLEBSIELLA PNEUMONIAE  Final      Susceptibility   Klebsiella pneumoniae - MIC*    AMPICILLIN >=32 RESISTANT Resistant     CEFAZOLIN <=4 SENSITIVE Sensitive     CEFEPIME <=1 SENSITIVE Sensitive     CEFTAZIDIME <=1 SENSITIVE Sensitive     CEFTRIAXONE <=1 SENSITIVE Sensitive     CIPROFLOXACIN <=0.25 SENSITIVE Sensitive     GENTAMICIN <=1 SENSITIVE Sensitive     IMIPENEM <=0.25 SENSITIVE Sensitive     TRIMETH/SULFA <=20 SENSITIVE Sensitive     AMPICILLIN/SULBACTAM 8 SENSITIVE Sensitive     PIP/TAZO 8 SENSITIVE Sensitive     Extended ESBL NEGATIVE Sensitive     * KLEBSIELLA PNEUMONIAE  Blood Culture ID Panel (Reflexed)     Status: Abnormal   Collection Time: 06/27/16  9:22 PM  Result Value Ref Range Status   Enterococcus species NOT DETECTED NOT DETECTED Final   Vancomycin resistance NOT DETECTED NOT DETECTED  Final   Listeria monocytogenes NOT DETECTED NOT DETECTED Final   Staphylococcus species NOT DETECTED NOT DETECTED Final   Staphylococcus aureus NOT DETECTED NOT DETECTED Final   Methicillin resistance NOT DETECTED NOT DETECTED Final   Streptococcus species NOT DETECTED NOT DETECTED Final   Streptococcus agalactiae NOT DETECTED NOT DETECTED Final   Streptococcus pneumoniae NOT DETECTED NOT DETECTED Final   Streptococcus pyogenes NOT DETECTED NOT DETECTED Final   Acinetobacter baumannii NOT DETECTED NOT DETECTED Final   Enterobacteriaceae species DETECTED (A) NOT DETECTED Final  Comment: CRITICAL RESULT CALLED TO, READ BACK BY AND VERIFIED WITH: TELDRIN JAMES ON 06/28/16 AT 0926 QSD    Enterobacter cloacae complex NOT DETECTED NOT DETECTED Final   Escherichia coli NOT DETECTED NOT DETECTED Final   Klebsiella oxytoca NOT DETECTED NOT DETECTED Final   Klebsiella pneumoniae DETECTED (A) NOT DETECTED Final    Comment: CRITICAL RESULT CALLED TO, READ BACK BY AND VERIFIED WITH: TELDRIN JAMES ON 06/28/16 AT 0926 QSD    Proteus species NOT DETECTED NOT DETECTED Final   Serratia marcescens NOT DETECTED NOT DETECTED Final   Carbapenem resistance NOT DETECTED NOT DETECTED Final   Haemophilus influenzae NOT DETECTED NOT DETECTED Final   Neisseria meningitidis NOT DETECTED NOT DETECTED Final   Pseudomonas aeruginosa NOT DETECTED NOT DETECTED Final   Candida albicans NOT DETECTED NOT DETECTED Final   Candida glabrata NOT DETECTED NOT DETECTED Final   Candida krusei NOT DETECTED NOT DETECTED Final   Candida parapsilosis NOT DETECTED NOT DETECTED Final   Candida tropicalis NOT DETECTED NOT DETECTED Final  MRSA PCR Screening     Status: None   Collection Time: 06/28/16  3:15 AM  Result Value Ref Range Status   MRSA by PCR NEGATIVE NEGATIVE Final    Comment:        The GeneXpert MRSA Assay (FDA approved for NASAL specimens only), is one component of a comprehensive MRSA colonization surveillance  program. It is not intended to diagnose MRSA infection nor to guide or monitor treatment for MRSA infections.      Studies: No results found.  Scheduled Meds: . amLODipine  10 mg Oral Daily  . azithromycin  500 mg Intravenous Q24H  . cefTRIAXone (ROCEPHIN)  IV  2 g Intravenous Q24H  . docusate sodium  100 mg Oral BID  . FLUoxetine  40 mg Oral Daily  . guaiFENesin  600 mg Oral BID  . methylPREDNISolone (SOLU-MEDROL) injection  60 mg Intravenous Q6H  . mometasone-formoterol  2 puff Inhalation BID  . sodium chloride flush  3 mL Intravenous Q12H  . tamsulosin  0.4 mg Oral Daily   Continuous Infusions:    Assessment/Plan:  1. Severe sepsis with Klebsiella growing in the blood This is due to urinary source, continue RocephinChanged to Ceftin tomorrow 2. Nephrolithiasis. Status post ureteral stent by Dr. Erlene Quan urology Foley removal today waiting trial 3. Bilateral pneumonia: Continue ceftriaxone azithromycin and wean oxygen as tolerated 4. Acute renal failure due to  to kidney stones. Renal function normal 5. Hyponatremia likely secondary to dehydration. Improved with hydration 6. Acute respiratory failure with hypoxia. Due to acute on chronic COPD exasperation as well as bilateral pneumonia Improved with Solu-Medrol and antibiotics Code Status:     Code Status Orders        Start     Ordered   06/28/16 0326  Full code  Continuous     06/28/16 0325    Code Status History    Date Active Date Inactive Code Status Order ID Comments User Context   This patient has a current code status but no historical code status.     Disposition Plan: To be determined  Consultants:  Urology  Procedures:  Ureteral stent  Antibiotics:  Rocephin  Time spent: 25 minutes.Transferred to the floor [place], [facility]

## 2016-07-03 MED ORDER — CEFUROXIME AXETIL 500 MG PO TABS
500.0000 mg | ORAL_TABLET | Freq: Two times a day (BID) | ORAL | Status: DC
  Administered 2016-07-03 – 2016-07-04 (×2): 500 mg via ORAL
  Filled 2016-07-03 (×3): qty 1

## 2016-07-03 MED ORDER — PREDNISONE 50 MG PO TABS
50.0000 mg | ORAL_TABLET | Freq: Every day | ORAL | Status: DC
  Administered 2016-07-04: 50 mg via ORAL
  Filled 2016-07-03: qty 1

## 2016-07-03 MED ORDER — AZITHROMYCIN 250 MG PO TABS
500.0000 mg | ORAL_TABLET | Freq: Every day | ORAL | Status: DC
  Administered 2016-07-04: 500 mg via ORAL
  Filled 2016-07-03: qty 2

## 2016-07-03 NOTE — Plan of Care (Signed)
Problem: Bowel/Gastric: Goal: Will not experience complications related to bowel motility Outcome: Progressing Pt had BM today.

## 2016-07-03 NOTE — Progress Notes (Addendum)
[facility] PROGRESS NOTE  Edwin Donovan. TFT:[id number] DOB: 31-Dec-1945 DOA: 06/27/2016 PCP: [place]  HPI/Subjective:  Feeling better had a hard time sleeping last night no chest pain  Objective: Vitals:   07/03/16 1052 07/03/16 1314  BP:  133/77  Pulse: 82 82  Resp:  18  Temp:  97.5 F (36.4 C)    Filed Weights   06/30/16 0435 07/01/16 0619 07/03/16 0602  Weight: 135.8 kg (299 lb 6.4 oz) 133.4 kg (294 lb) 127.7 kg (281 lb 8 oz)    ROS: Review of Systems  Constitutional: Negative for chills and fever.  Eyes: Negative for blurred vision.  Respiratory: Positive for shortness of breath. Negative for cough.   Cardiovascular: Negative for chest pain.  Gastrointestinal: Negative for constipation, diarrhea, nausea and vomiting.  Genitourinary: Negative for dysuria and flank pain.  Musculoskeletal: Negative for joint pain.  Neurological: Negative for dizziness and headaches.   Exam: Physical Exam  Constitutional: He is oriented to person, place, and time.  HENT:  Nose: No mucosal edema.  Mouth/Throat: No oropharyngeal exudate or posterior oropharyngeal edema.  Eyes: Conjunctivae, EOM and lids are normal. Pupils are equal, round, and reactive to light.  Neck: No JVD present. Carotid bruit is not present. No edema present. No thyroid mass and no thyromegaly present.  Cardiovascular: S1 normal and S2 normal.  Exam reveals no gallop.   No murmur heard. Pulses:      Dorsalis pedis pulses are 2+ on the right side, and 2+ on the left side.  Respiratory: No respiratory distress. He has no wheezes. He has no rhonchi.  Decreased bs   GI: Soft. Bowel sounds are normal.  Musculoskeletal:       Right ankle: He exhibits swelling.       Left ankle: He exhibits swelling.  Lymphadenopathy:    He has no cervical adenopathy.  Neurological: He is alert and oriented to person, place, and time. No cranial nerve deficit.  Skin: Skin is warm. No rash noted.  Nails show no clubbing.  Psychiatric: He has a normal mood and affect.      Data Reviewed: Basic Metabolic Panel:  Recent Labs Lab 06/27/16 2122 06/29/16 0315 06/30/16 0553 07/02/16 1424  NA 130* 134* 138 132*  K 4.1 4.3 4.3 4.4  CL 97* 102 107 101  CO2 21* '24 26 23  '$ GLUCOSE 115* 160* 122* 210*  BUN 40* 57* 39* 33*  CREATININE 2.47* 1.65* 0.95 0.99  CALCIUM 8.3* 7.6* 8.2* 8.7*   Liver Function Tests:  Recent Labs Lab 06/27/16 2122  AST 50*  ALT 22  ALKPHOS 94  BILITOT 1.0  PROT 6.8  ALBUMIN 3.2*   CBC:  Recent Labs Lab 06/27/16 2122 06/29/16 0315 06/30/16 0553 07/02/16 1424  WBC 2.2* 16.7* 14.6* 12.7*  NEUTROABS 2.0  --   --  12.0*  HGB 13.5 12.0* 12.8* 14.2  HCT 38.7* 34.9* 37.1* 41.9  MCV 92.6 94.1 93.6 92.8  PLT 86* 63* 57* 95*   Cardiac Enzymes:  Recent Labs Lab 06/28/16 0245  TROPONINI 0.06*   BNP (last 3 results)  Recent Labs  06/27/16 2122  BNP 239.0*     CBG:  Recent Labs Lab 06/28/16 0316  GLUCAP 199*    Recent Results (from the past 240 hour(s))  Urine culture     Status: Abnormal   Collection Time: 06/27/16  9:21 PM  Result Value Ref Range Status   Specimen Description URINE, RANDOM  Final  Special Requests NONE  Final   Culture >=100,000 COLONIES/mL KLEBSIELLA PNEUMONIAE (A)  Final   Report Status 07/01/2016 FINAL  Final   Organism ID, Bacteria KLEBSIELLA PNEUMONIAE (A)  Final      Susceptibility   Klebsiella pneumoniae - MIC*    AMPICILLIN 16 RESISTANT Resistant     CEFAZOLIN <=4 SENSITIVE Sensitive     CEFTRIAXONE <=1 SENSITIVE Sensitive     CIPROFLOXACIN <=0.25 SENSITIVE Sensitive     GENTAMICIN <=1 SENSITIVE Sensitive     IMIPENEM <=0.25 SENSITIVE Sensitive     NITROFURANTOIN 64 INTERMEDIATE Intermediate     TRIMETH/SULFA <=20 SENSITIVE Sensitive     AMPICILLIN/SULBACTAM 4 SENSITIVE Sensitive     PIP/TAZO <=4 SENSITIVE Sensitive     Extended ESBL NEGATIVE Sensitive     * >=100,000 COLONIES/mL KLEBSIELLA  PNEUMONIAE  Blood Culture (routine x 2)     Status: Abnormal   Collection Time: 06/27/16  9:22 PM  Result Value Ref Range Status   Specimen Description BLOOD RIGHT ANTECUBITAL  Final   Special Requests BOTTLES DRAWN AEROBIC AND ANAEROBIC 5CCAERO,4CCANA  Final   Culture  Setup Time   Final    GRAM NEGATIVE RODS IN BOTH AEROBIC AND ANAEROBIC BOTTLES CRITICAL VALUE NOTED.  VALUE IS CONSISTENT WITH PREVIOUSLY REPORTED AND CALLED VALUE.    Culture (A)  Final    KLEBSIELLA PNEUMONIAE SUSCEPTIBILITIES PERFORMED ON PREVIOUS CULTURE WITHIN THE LAST 5 DAYS. Performed at [facility]    Report Status 06/30/2016 FINAL  Final  Blood Culture (routine x 2)     Status: Abnormal   Collection Time: 06/27/16  9:22 PM  Result Value Ref Range Status   Specimen Description BLOOD LEFT WRIST  Final   Special Requests BOTTLES DRAWN AEROBIC AND ANAEROBIC 5CCAERO,4CCANA  Final   Culture  Setup Time   Final    GRAM NEGATIVE RODS IN BOTH AEROBIC AND ANAEROBIC BOTTLES CRITICAL RESULT CALLED TO, READ BACK BY AND VERIFIED WITH: TELDRIN JAMES ON 06/28/16 AT 9983 QSD    Culture KLEBSIELLA PNEUMONIAE (A)  Final   Report Status 06/30/2016 FINAL  Final   Organism ID, Bacteria KLEBSIELLA PNEUMONIAE  Final      Susceptibility   Klebsiella pneumoniae - MIC*    AMPICILLIN >=32 RESISTANT Resistant     CEFAZOLIN <=4 SENSITIVE Sensitive     CEFEPIME <=1 SENSITIVE Sensitive     CEFTAZIDIME <=1 SENSITIVE Sensitive     CEFTRIAXONE <=1 SENSITIVE Sensitive     CIPROFLOXACIN <=0.25 SENSITIVE Sensitive     GENTAMICIN <=1 SENSITIVE Sensitive     IMIPENEM <=0.25 SENSITIVE Sensitive     TRIMETH/SULFA <=20 SENSITIVE Sensitive     AMPICILLIN/SULBACTAM 8 SENSITIVE Sensitive     PIP/TAZO 8 SENSITIVE Sensitive     Extended ESBL NEGATIVE Sensitive     * KLEBSIELLA PNEUMONIAE  Blood Culture ID Panel (Reflexed)     Status: Abnormal   Collection Time: 06/27/16  9:22 PM  Result Value Ref Range Status   Enterococcus species NOT  DETECTED NOT DETECTED Final   Vancomycin resistance NOT DETECTED NOT DETECTED Final   Listeria monocytogenes NOT DETECTED NOT DETECTED Final   Staphylococcus species NOT DETECTED NOT DETECTED Final   Staphylococcus aureus NOT DETECTED NOT DETECTED Final   Methicillin resistance NOT DETECTED NOT DETECTED Final   Streptococcus species NOT DETECTED NOT DETECTED Final   Streptococcus agalactiae NOT DETECTED NOT DETECTED Final   Streptococcus pneumoniae NOT DETECTED NOT DETECTED Final   Streptococcus pyogenes NOT DETECTED NOT DETECTED  Final   Acinetobacter baumannii NOT DETECTED NOT DETECTED Final   Enterobacteriaceae species DETECTED (A) NOT DETECTED Final    Comment: CRITICAL RESULT CALLED TO, READ BACK BY AND VERIFIED WITH: TELDRIN JAMES ON 06/28/16 AT 0926 QSD    Enterobacter cloacae complex NOT DETECTED NOT DETECTED Final   Escherichia coli NOT DETECTED NOT DETECTED Final   Klebsiella oxytoca NOT DETECTED NOT DETECTED Final   Klebsiella pneumoniae DETECTED (A) NOT DETECTED Final    Comment: CRITICAL RESULT CALLED TO, READ BACK BY AND VERIFIED WITH: TELDRIN JAMES ON 06/28/16 AT 0926 QSD    Proteus species NOT DETECTED NOT DETECTED Final   Serratia marcescens NOT DETECTED NOT DETECTED Final   Carbapenem resistance NOT DETECTED NOT DETECTED Final   Haemophilus influenzae NOT DETECTED NOT DETECTED Final   Neisseria meningitidis NOT DETECTED NOT DETECTED Final   Pseudomonas aeruginosa NOT DETECTED NOT DETECTED Final   Candida albicans NOT DETECTED NOT DETECTED Final   Candida glabrata NOT DETECTED NOT DETECTED Final   Candida krusei NOT DETECTED NOT DETECTED Final   Candida parapsilosis NOT DETECTED NOT DETECTED Final   Candida tropicalis NOT DETECTED NOT DETECTED Final  MRSA PCR Screening     Status: None   Collection Time: 06/28/16  3:15 AM  Result Value Ref Range Status   MRSA by PCR NEGATIVE NEGATIVE Final    Comment:        The GeneXpert MRSA Assay (FDA approved for NASAL  specimens only), is one component of a comprehensive MRSA colonization surveillance program. It is not intended to diagnose MRSA infection nor to guide or monitor treatment for MRSA infections.      Studies: No results found.  Scheduled Meds: . amLODipine  10 mg Oral Daily  . azithromycin  500 mg Intravenous Q24H  . cefTRIAXone (ROCEPHIN)  IV  2 g Intravenous Q24H  . docusate sodium  100 mg Oral BID  . FLUoxetine  40 mg Oral Daily  . guaiFENesin  600 mg Oral BID  . methylPREDNISolone (SOLU-MEDROL) injection  60 mg Intravenous Q6H  . mometasone-formoterol  2 puff Inhalation BID  . sodium chloride flush  3 mL Intravenous Q12H  . tamsulosin  0.4 mg Oral Daily   Continuous Infusions:    Assessment/Plan:  1. Severe sepsis with Klebsiella growing in the blood This is due to urinary source, continue Changed to Ceftin today 2. Nephrolithiasis. Status post ureteral stent by Dr. Erlene Quan urology Foley removed yest no issues with voiding  3. Bilateral pneumonia: Changed to Ceftin continue oral Zithromax for 5 days 4. Acute renal failure due to  to kidney stones. Renal function normal 5. Hyponatremia likely secondary to dehydration. Improved with hydration 6. Acute respiratory failure with hypoxia. Due to acute on chronic COPD exasperation as well as bilateral pneumonia Oral prednisone Code Status:     Code Status Orders        Start     Ordered   06/28/16 0326  Full code  Continuous     06/28/16 0325    Code Status History    Date Active Date Inactive Code Status Order ID Comments User Context   This patient has a current code status but no historical code status.     Disposition Plan: To be determined  Consultants:  Urology  Procedures:  Ureteral stent  Antibiotics:  Rocephin  Time spent: 25 minutes dischar to rehabilitation tomorrow [place], [facility]

## 2016-07-03 NOTE — NC FL2 (Signed)
[place] LEVEL OF CARE SCREENING TOOL     IDENTIFICATION  Patient Name: Edwin Donovan. Birthdate: Jan 12, 1946 Sex: male Admission Date (Current Location): 06/27/2016  [place] and [place] Number:  Engineering geologist and Address:  [facility], [address], [place], [place] [address]      Provider Number: [id number]  Attending Physician Name and Address:  Dustin Flock, MD  Relative Name and Phone Number:       Current Level of Care: Hospital Recommended Level of Care: [place] Prior Approval Number:    Date Approved/Denied:   PASRR Number:    Discharge Plan: SNF    Current Diagnoses: Patient Active Problem List   Diagnosis Date Noted  . Sepsis ([place]) 06/28/2016  . Right ureteral stone     Orientation RESPIRATION BLADDER Height & Weight     Self, Time, Situation, Place (no issues)  Normal Incontinent Weight: 281 lb 8 oz (127.7 kg) Height:  '5\' 11"'$  (180.3 cm)  BEHAVIORAL SYMPTOMS/MOOD NEUROLOGICAL BOWEL NUTRITION STATUS   (no issues)  (none) Incontinent Diet (regular)  AMBULATORY STATUS COMMUNICATION OF NEEDS Skin   Limited Assist Verbally Normal                       Personal Care Assistance Level of Assistance  Bathing, Dressing Bathing Assistance: Limited assistance   Dressing Assistance: Limited assistance     Functional Limitations Info   (none)          SPECIAL CARE FACTORS FREQUENCY  PT (By licensed PT)                    Contractures Contractures Info: Not present    Additional Factors Info  Allergies, Code Status Code Status Info: full Allergies Info: nka           Current Medications (07/03/2016):  This is the current hospital active medication list Current Facility-Administered Medications  Medication Dose Route Frequency Provider Last Rate Last Dose  . acetaminophen (TYLENOL) tablet 650 mg  650 mg Oral Q6H PRN Harrie Foreman, MD   650 mg at  07/02/16 1203   Or  . acetaminophen (TYLENOL) suppository 650 mg  650 mg Rectal Q6H PRN Harrie Foreman, MD      . albuterol (PROVENTIL) (2.5 MG/3ML) 0.083% nebulizer solution 3 mL  3 mL Inhalation Q6H PRN Harrie Foreman, MD   3 mL at 06/29/16 1823  . amLODipine (NORVASC) tablet 10 mg  10 mg Oral Daily Dustin Flock, MD   10 mg at 07/03/16 0900  . azithromycin (ZITHROMAX) 500 mg in dextrose 5 % 250 mL IVPB  500 mg Intravenous Q24H Dustin Flock, MD   500 mg at 07/02/16 1202  . cefTRIAXone (ROCEPHIN) 2 g in dextrose 5 % 50 mL IVPB  2 g Intravenous Q24H Flora Lipps, MD   2 g at 07/03/16 1052  . docusate sodium (COLACE) capsule 100 mg  100 mg Oral BID Harrie Foreman, MD   100 mg at 07/03/16 0859  . FLUoxetine (PROZAC) capsule 40 mg  40 mg Oral Daily Harrie Foreman, MD   40 mg at 07/03/16 0900  . guaiFENesin (MUCINEX) 12 hr tablet 600 mg  600 mg Oral BID Dustin Flock, MD   600 mg at 07/03/16 0900  . methylPREDNISolone sodium succinate (SOLU-MEDROL) 125 mg/2 mL injection 60 mg  60 mg Intravenous Q6H Dustin Flock, MD   60 mg at 07/03/16  81  . mometasone-formoterol (DULERA) 200-5 MCG/ACT inhaler 2 puff  2 puff Inhalation BID Harrie Foreman, MD   2 puff at 07/03/16 [phone]  . morphine 2 MG/ML injection 2 mg  2 mg Intravenous Q4H PRN Lance Coon, MD   2 mg at 07/03/16 0148  . ondansetron (ZOFRAN) tablet 4 mg  4 mg Oral Q6H PRN Harrie Foreman, MD       Or  . ondansetron [facility]) injection 4 mg  4 mg Intravenous Q6H PRN Harrie Foreman, MD   4 mg at 07/01/16 1422  . oxyCODONE (Oxy IR/ROXICODONE) immediate release tablet 5 mg  5 mg Oral Q4H PRN Harrie Foreman, MD   5 mg at 07/01/16 4128  . sodium chloride flush (NS) 0.9 % injection 3 mL  3 mL Intravenous Q12H Harrie Foreman, MD   3 mL at 07/03/16 0909  . tamsulosin (FLOMAX) capsule 0.4 mg  0.4 mg Oral Daily Harrie Foreman, MD   0.4 mg at 07/03/16 0900  . traZODone (DESYREL) tablet 100 mg  100 mg Oral QHS PRN Lytle Butte, MD    100 mg at 07/02/16 2111     Discharge Medications: Please see discharge summary for a list of discharge medications.  Relevant Imaging Results:  Relevant Lab Results:   Additional Information SS: [id number]  Shela Leff, LCSW

## 2016-07-03 NOTE — Care Management Important Message (Signed)
Important Message  Patient Details  Name: Edwin Donovan. MRN: [id number] Date of Birth: 04/27/1946   Medicare Important Message Given:  Yes    Beverly Sessions, RN 07/03/2016, 3:26 PM

## 2016-07-03 NOTE — Progress Notes (Signed)
Pt ambulated to bathroom and had a BM. Oxygen was at 89% while ambulating. Pt needs to ambulate out in the hallway to assess for O2 needs at D/C. Will pass along to nightshift RN and NT.

## 2016-07-03 NOTE — Clinical Social Work Note (Signed)
Clinical Social Work Assessment  Patient Details  Name: Edwin Donovan. MRN: [id number] Date of Birth: 05-09-1946  Date of referral:  07/03/16               Reason for consult:  Facility Placement                Permission sought to share information with:  Chartered certified accountant granted to share information::  Yes, Verbal Permission Granted  Name::        Agency::     Relationship::     Contact Information:     Housing/Transportation Living arrangements for the past 2 months:  Single Family Home Source of Information:  Patient Patient Interpreter Needed:  None Criminal Activity/Legal Involvement Pertinent to Current Situation/Hospitalization:  No - Comment as needed Significant Relationships:  Adult Children Lives with:  Self Do you feel safe going back to the place where you live?  Yes Need for family participation in patient care:  No (Coment)  Care giving concerns:  Patient lives alone and requires assistance with ADL's.   Social Worker assessment / plan:  CSW informed by MD that patient is now agreeable to STR. CSW spoke with patient who was very pleasant and was honest about his hesitation to go to STR. Patient stated that he has had some time to think about it and stated that he wouldn't be smart if he didn't go to rehab. He was apologizing to [place] for initially refusing STR and stated he is stubborn and "set in his ways." He stated that his wife has been at [facility] for 4 years and that he has had some complaints but he stated that is to be expected given the time she has been there. Patient further noted that his wife was recently placed on hospice services about 3 weeks ago. He stated that he has had some adjustment with that and hoping that she has a more time to live. Patient states he wishes to go to [facility] if possible so he could be close to his wife. CSW provided supportive counseling and listening. Bedsearch  initiated. Employment status:  Retired Forensic scientist:  Medicare PT Recommendations:  [place] / Referral to community resources:  [place]  Patient/Family's Response to care:  Patient pleasant and cooperative.  Patient/Family's Understanding of and Emotional Response to Diagnosis, Current Treatment, and Prognosis:  Patient is fully aware of his limitations and has insight into what might happen if he went home. Patient is goal oriented and wishes to get stronger and return home after rehab.  Emotional Assessment Appearance:  Appears stated age Attitude/Demeanor/Rapport:   (pleasant and cooperative) Affect (typically observed):  Accepting, Adaptable, Pleasant, Calm, Appropriate Orientation:  Oriented to Self, Oriented to Place, Oriented to  Time, Oriented to Situation Alcohol / Substance use:  Not Applicable Psych involvement (Current and /or in the community):  No (Comment)  Discharge Needs  Concerns to be addressed:  Care Coordination Readmission within the last 30 days:  No Current discharge risk:  None Barriers to Discharge:  No Barriers Identified   Shela Leff, LCSW 07/03/2016, 11:23 AM

## 2016-07-04 MED ORDER — DOCUSATE SODIUM 100 MG PO CAPS: 100.0000 mg | ORAL_CAPSULE | Freq: Two times a day (BID) | ORAL | 0 refills | Status: DC

## 2016-07-04 MED ORDER — OXYCODONE HCL 5 MG PO TABS: 5.0000 mg | ORAL_TABLET | ORAL | 0 refills | Status: DC | PRN

## 2016-07-04 MED ORDER — CEFUROXIME AXETIL 500 MG PO TABS: 500.0000 mg | ORAL_TABLET | Freq: Two times a day (BID) | ORAL | Status: AC

## 2016-07-04 MED ORDER — IPRATROPIUM-ALBUTEROL 0.5-2.5 (3) MG/3ML IN SOLN: 3.0000 mL | Freq: Four times a day (QID) | RESPIRATORY_TRACT | Status: DC | PRN

## 2016-07-04 NOTE — Progress Notes (Signed)
Report called to Kim at [facility]. Family will transport to facility later.

## 2016-07-04 NOTE — Discharge Summary (Signed)
Edwin Mor., 70 y.o., DOB 22-Jun-1946, MRN [id number]. Admission date: 06/27/2016 Discharge Date 07/04/2016 Primary MD [facility] Admitting Physician Harrie Foreman, MD  Admission Diagnosis  Pain [R52] Sepsis, due to unspecified organism [facility]) [A41.9]  Discharge Diagnosis   Active Problems:   Sepsis ([place]) to Klebsiella pneumonia UTI   Right ureteral stone  UTI   Nephrolithiasis status post urethral stent  Acute renal failure due to kidney stone resolved  Acute hypoxic respiratory failure due to pneumonia and COPD exasperation  Bilateral pneumonia Hyponatremia          Hospital Course  The patient with past medical history of COPD and hypertension presents emergency department complaining of shortness of breath. He stated that his dyspnea was accompanied by chills as well as feeling generally achy. Upon presentation the patient was tachypneic and diaphoretic. He was placed on BiPAP which improved his respiratory rate and normal breathing. Laboratory evaluation revealed acute kidney injury. The patient has known that he had kidney stones and had seen the urologist. CT evaluation in the emergency department revealed hydronephrosis with nonobstructing renal calculi. After initiating septic protocol the emergency department patient was started on IV antibiotics. Patient was seen by urology and taken to the OR and had a cystoscopy and a right-sided stent placed. Patient tolerated the procedure. He continued to complain of shortness of breath therefore had a CT scan of the chest which showed bilateral pneumonia. He was treated for acute COPD exasperation as well as bilateral pneumonia his breathing is much improved and he is doing well. He is very weak and deconditioned need of rehabilitation.           Consults  urology  Significant Tests:  See full reports for all details     Ct Angio Chest Pe W Or Wo Contrast  Result Date: 06/30/2016 CLINICAL DATA:   Respiratory failure EXAM: CT ANGIOGRAPHY CHEST WITH CONTRAST TECHNIQUE: Multidetector CT imaging of the chest was performed using the standard protocol during bolus administration of intravenous contrast. Multiplanar CT image reconstructions and MIPs were obtained to evaluate the vascular anatomy. CONTRAST:  75 cc Isovue 370 COMPARISON:  06/27/2016 CT abdomen and pelvis FINDINGS: There are no filling defects in the pulmonary arterial tree. No abnormal mediastinal adenopathy. There are small scattered mediastinal nodes. Peribronchovascular soft tissue prominence in both lower hilar regions right greater than left. These findings may represent adenopathy or inflammatory changes of the central airways. Low-density posterior to the left atrial appendage cyst felt to represent physiologic pericardial fluid. Bibasilar dependent consolidation. There are patchy areas of ground-glass in the anterior upper lobes, posterior left upper lobe, and right middle lobe associated with interlobular septal thickening. These findings are most consistent with an inflammatory process. There is no evidence of aortic aneurysm or aortic dissection. Three vessel coronary artery calcification. Aortic arch atherosclerotic calcification. There is some smooth plaque in the innominate artery. Great vessels are grossly patent. Tiny pleural effusions bilaterally right greater than left. No vertebral compression.  Mild thoracic scoliosis. Review of the MIP images confirms the above findings. IMPRESSION: No evidence of acute pulmonary thromboembolism. Bibasilar consolidation. There are also ground-glass and heterogeneous opacities throughout the remainder of the gloves. Findings most likely represent an inflammatory process or pneumonia. Also consider pulmonary edema and ARDS. Possible bilateral hilar adenopathy. This may be related to the above mentioned volume overload or inflammatory process. Tiny pleural effusions. Electronically Signed   By:  Marybelle Killings M.D.   On: 06/30/2016 10:36  Dg Chest Portable 1 View  Result Date: 06/27/2016 CLINICAL DATA:  Respiratory distress. EXAM: PORTABLE CHEST 1 VIEW COMPARISON:  None. FINDINGS: The heart size and mediastinal contours are within normal limits. Both lungs are clear. The visualized skeletal structures are unremarkable. IMPRESSION: No active disease. Electronically Signed   By: Fidela Salisbury M.D.   On: 06/27/2016 22:06   Ct Renal Stone Study  Result Date: 06/27/2016 CLINICAL DATA:  71 year old with abdominal pain and sepsis. EXAM: CT ABDOMEN AND PELVIS WITHOUT CONTRAST TECHNIQUE: Multidetector CT imaging of the abdomen and pelvis was performed following the standard protocol without IV contrast. COMPARISON:  None. FINDINGS: Please note that parenchymal abnormalities may be missed without intravenous contrast. Lower chest:  Coronary artery calcifications noted. Hepatobiliary: The liver and gallbladder are unremarkable. There is no evidence of biliary dilatation. Pancreas: Unremarkable Spleen: Unremarkable Adrenals/Urinary Tract: A 6 mm right UVJ calculus causes moderate right hydroureteronephrosis and perinephric inflammation. A 2 mm nonobstructing distal right ureteral calculus identified approximately with 1 cm above the UVJ. Punctate nonobstructing bilateral renal calculi are identified. A nonobstructing 5 mm posteriorly right renal calculus noted. Left renal cysts are present. The adrenal glands are unremarkable. Stomach/Bowel: Colonic diverticulosis noted without diverticulitis. There is no evidence of bowel obstruction or definite bowel wall thickening. The appendix is normal. Vascular/Lymphatic: Abdominal aortic atherosclerotic calcifications noted. The distal aorta measures 3.1 cm in greatest diameter. No enlarged lymph nodes identified. Reproductive: Prostatectomy changes identified. Other: No free fluid, focal collection or pneumoperitoneum. Musculoskeletal: No acute or suspicious  abnormalities. Degenerative changes within the lumbar spine noted. IMPRESSION: 6 mm right UVJ calculus causing moderate right hydroureteronephrosis. 2 mm nonobstructing distal right ureteral calculus, approximately 1 cm above the right UVJ. Bilateral nonobstructing renal calculi. 3.1 cm distal abdominal aortic aneurysm. Recommend followup by ultrasound in 3 years. This recommendation follows ACR consensus guidelines: White Paper of the ACR Incidental Findings Committee II on Vascular Findings. J Am Coll Radiol 2013; 13:244-010 Abdominal aortic atherosclerosis. Coronary artery disease. Electronically Signed   By: Margarette Canada M.D.   On: 06/27/2016 22:42       Today   Subjective:   Edwin Donovan  Feeling better denies any chest pain or shortness of breath Blood pressure 135/85, pulse 84, temperature 98 F (36.7 C), temperature source Oral, resp. rate 18, height '5\' 11"'$  (1.803 m), weight 127.7 kg (281 lb 8 oz), SpO2 (!) 89 %.  .  Intake/Output Summary (Last 24 hours) at 07/04/16 1017 Last data filed at 07/04/16 0933  Gross per 24 hour  Intake              480 ml  Output                0 ml  Net              480 ml    Exam VITAL SIGNS: Blood pressure 135/85, pulse 84, temperature 98 F (36.7 C), temperature source Oral, resp. rate 18, height '5\' 11"'$  (1.803 m), weight 127.7 kg (281 lb 8 oz), SpO2 (!) 89 %.  GENERAL:  70 y.o.-year-old patient lying in the bed with no acute distress.  EYES: Pupils equal, round, reactive to light and accommodation. No scleral icterus. Extraocular muscles intact.  HEENT: Head atraumatic, normocephalic. Oropharynx and nasopharynx clear.  NECK:  Supple, no jugular venous distention. No thyroid enlargement, no tenderness.  LUNGS: Normal breath sounds bilaterally, no wheezing, rales,rhonchi or crepitation. No use of accessory muscles of respiration.  CARDIOVASCULAR: S1, S2 normal.  No murmurs, rubs, or gallops.  ABDOMEN: Soft, nontender, nondistended. Bowel sounds  present. No organomegaly or mass.  EXTREMITIES: No pedal edema, cyanosis, or clubbing.  NEUROLOGIC: Cranial nerves II through XII are intact. Muscle strength 5/5 in all extremities. Sensation intact. Gait not checked.  PSYCHIATRIC: The patient is alert and oriented x 3.  SKIN: No obvious rash, lesion, or ulcer.   Data Review     CBC w Diff: Lab Results  Component Value Date   WBC 12.7 (H) 07/02/2016   HGB 14.2 07/02/2016   HCT 41.9 07/02/2016   PLT 95 (L) 07/02/2016   LYMPHOPCT 3% 07/02/2016   MONOPCT 2% 07/02/2016   EOSPCT 0% 07/02/2016   BASOPCT 0% 07/02/2016   CMP: Lab Results  Component Value Date   NA 132 (L) 07/02/2016   K 4.4 07/02/2016   CL 101 07/02/2016   CO2 23 07/02/2016   BUN 33 (H) 07/02/2016   CREATININE 0.99 07/02/2016   PROT 6.8 06/27/2016   ALBUMIN 3.2 (L) 06/27/2016   BILITOT 1.0 06/27/2016   ALKPHOS 94 06/27/2016   AST 50 (H) 06/27/2016   ALT 22 06/27/2016  .  Micro Results Recent Results (from the past 240 hour(s))  Urine culture     Status: Abnormal   Collection Time: 06/27/16  9:21 PM  Result Value Ref Range Status   Specimen Description URINE, RANDOM  Final   Special Requests NONE  Final   Culture >=100,000 COLONIES/mL KLEBSIELLA PNEUMONIAE (A)  Final   Report Status 07/01/2016 FINAL  Final   Organism ID, Bacteria KLEBSIELLA PNEUMONIAE (A)  Final      Susceptibility   Klebsiella pneumoniae - MIC*    AMPICILLIN 16 RESISTANT Resistant     CEFAZOLIN <=4 SENSITIVE Sensitive     CEFTRIAXONE <=1 SENSITIVE Sensitive     CIPROFLOXACIN <=0.25 SENSITIVE Sensitive     GENTAMICIN <=1 SENSITIVE Sensitive     IMIPENEM <=0.25 SENSITIVE Sensitive     NITROFURANTOIN 64 INTERMEDIATE Intermediate     TRIMETH/SULFA <=20 SENSITIVE Sensitive     AMPICILLIN/SULBACTAM 4 SENSITIVE Sensitive     PIP/TAZO <=4 SENSITIVE Sensitive     Extended ESBL NEGATIVE Sensitive     * >=100,000 COLONIES/mL KLEBSIELLA PNEUMONIAE  Blood Culture (routine x 2)     Status:  Abnormal   Collection Time: 06/27/16  9:22 PM  Result Value Ref Range Status   Specimen Description BLOOD RIGHT ANTECUBITAL  Final   Special Requests BOTTLES DRAWN AEROBIC AND ANAEROBIC 5CCAERO,4CCANA  Final   Culture  Setup Time   Final    GRAM NEGATIVE RODS IN BOTH AEROBIC AND ANAEROBIC BOTTLES CRITICAL VALUE NOTED.  VALUE IS CONSISTENT WITH PREVIOUSLY REPORTED AND CALLED VALUE.    Culture (A)  Final    KLEBSIELLA PNEUMONIAE SUSCEPTIBILITIES PERFORMED ON PREVIOUS CULTURE WITHIN THE LAST 5 DAYS. Performed at [facility]    Report Status 06/30/2016 FINAL  Final  Blood Culture (routine x 2)     Status: Abnormal   Collection Time: 06/27/16  9:22 PM  Result Value Ref Range Status   Specimen Description BLOOD LEFT WRIST  Final   Special Requests BOTTLES DRAWN AEROBIC AND ANAEROBIC 5CCAERO,4CCANA  Final   Culture  Setup Time   Final    GRAM NEGATIVE RODS IN BOTH AEROBIC AND ANAEROBIC BOTTLES CRITICAL RESULT CALLED TO, READ BACK BY AND VERIFIED WITH: Dicie Beam ON 06/28/16 AT 7106 QSD    Culture KLEBSIELLA PNEUMONIAE (A)  Final   Report Status 06/30/2016 FINAL  Final   Organism ID, Bacteria KLEBSIELLA PNEUMONIAE  Final      Susceptibility   Klebsiella pneumoniae - MIC*    AMPICILLIN >=32 RESISTANT Resistant     CEFAZOLIN <=4 SENSITIVE Sensitive     CEFEPIME <=1 SENSITIVE Sensitive     CEFTAZIDIME <=1 SENSITIVE Sensitive     CEFTRIAXONE <=1 SENSITIVE Sensitive     CIPROFLOXACIN <=0.25 SENSITIVE Sensitive     GENTAMICIN <=1 SENSITIVE Sensitive     IMIPENEM <=0.25 SENSITIVE Sensitive     TRIMETH/SULFA <=20 SENSITIVE Sensitive     AMPICILLIN/SULBACTAM 8 SENSITIVE Sensitive     PIP/TAZO 8 SENSITIVE Sensitive     Extended ESBL NEGATIVE Sensitive     * KLEBSIELLA PNEUMONIAE  Blood Culture ID Panel (Reflexed)     Status: Abnormal   Collection Time: 06/27/16  9:22 PM  Result Value Ref Range Status   Enterococcus species NOT DETECTED NOT DETECTED Final   Vancomycin resistance  NOT DETECTED NOT DETECTED Final   Listeria monocytogenes NOT DETECTED NOT DETECTED Final   Staphylococcus species NOT DETECTED NOT DETECTED Final   Staphylococcus aureus NOT DETECTED NOT DETECTED Final   Methicillin resistance NOT DETECTED NOT DETECTED Final   Streptococcus species NOT DETECTED NOT DETECTED Final   Streptococcus agalactiae NOT DETECTED NOT DETECTED Final   Streptococcus pneumoniae NOT DETECTED NOT DETECTED Final   Streptococcus pyogenes NOT DETECTED NOT DETECTED Final   Acinetobacter baumannii NOT DETECTED NOT DETECTED Final   Enterobacteriaceae species DETECTED (A) NOT DETECTED Final    Comment: CRITICAL RESULT CALLED TO, READ BACK BY AND VERIFIED WITH: TELDRIN JAMES ON 06/28/16 AT 0926 QSD    Enterobacter cloacae complex NOT DETECTED NOT DETECTED Final   Escherichia coli NOT DETECTED NOT DETECTED Final   Klebsiella oxytoca NOT DETECTED NOT DETECTED Final   Klebsiella pneumoniae DETECTED (A) NOT DETECTED Final    Comment: CRITICAL RESULT CALLED TO, READ BACK BY AND VERIFIED WITH: TELDRIN JAMES ON 06/28/16 AT 0926 QSD    Proteus species NOT DETECTED NOT DETECTED Final   Serratia marcescens NOT DETECTED NOT DETECTED Final   Carbapenem resistance NOT DETECTED NOT DETECTED Final   Haemophilus influenzae NOT DETECTED NOT DETECTED Final   Neisseria meningitidis NOT DETECTED NOT DETECTED Final   Pseudomonas aeruginosa NOT DETECTED NOT DETECTED Final   Candida albicans NOT DETECTED NOT DETECTED Final   Candida glabrata NOT DETECTED NOT DETECTED Final   Candida krusei NOT DETECTED NOT DETECTED Final   Candida parapsilosis NOT DETECTED NOT DETECTED Final   Candida tropicalis NOT DETECTED NOT DETECTED Final  MRSA PCR Screening     Status: None   Collection Time: 06/28/16  3:15 AM  Result Value Ref Range Status   MRSA by PCR NEGATIVE NEGATIVE Final    Comment:        The GeneXpert MRSA Assay (FDA approved for NASAL specimens only), is one component of a comprehensive MRSA  colonization surveillance program. It is not intended to diagnose MRSA infection nor to guide or monitor treatment for MRSA infections.         Code Status Orders        Start     Ordered   06/28/16 0326  Full code  Continuous     06/28/16 0325    Code Status History    Date Active Date Inactive Code Status Order ID Comments User Context   06/28/2016  3:25 AM 06/30/2016  1:24 PM Full Code [id number]  Harrie Foreman, MD Inpatient  Follow-up Information    Hollice Espy, MD .   Specialty:  Urology Why:  1-2 weeks  Contact information: [place] [address] [phone]        [facility] Follow up in 2 week(s).   Specialty:  General Practice Contact information: [place] [address] [phone]           Discharge Medications     Medication List    STOP taking these medications   hydrochlorothiazide 25 MG tablet Commonly known as:  HYDRODIURIL     TAKE these medications   albuterol 108 (90 Base) MCG/ACT inhaler Commonly known as:  PROVENTIL HFA;VENTOLIN HFA Inhale 1-2 puffs into the lungs every 6 (six) hours as needed for wheezing or shortness of breath.   amLODipine 10 MG tablet Commonly known as:  NORVASC Take 10 mg by mouth daily.   budesonide-formoterol 160-4.5 MCG/ACT inhaler Commonly known as:  SYMBICORT Inhale 2 puffs into the lungs 2 (two) times daily.   cefUROXime 500 MG tablet Commonly known as:  CEFTIN Take 1 tablet (500 mg total) by mouth 2 (two) times daily with a meal.   docusate sodium 100 MG capsule Commonly known as:  COLACE Take 1 capsule (100 mg total) by mouth 2 (two) times daily.   FLUoxetine 20 MG capsule Commonly known as:  PROZAC Take 40 mg by mouth daily.   ibuprofen 600 MG tablet Commonly known as:  ADVIL,MOTRIN Take 600 mg by mouth every 6 (six) hours as needed for mild pain or moderate pain.   oxyCODONE 5 MG immediate release  tablet Commonly known as:  Oxy IR/ROXICODONE Take 1 tablet (5 mg total) by mouth every 4 (four) hours as needed for moderate pain.   tamsulosin 0.4 MG Caps capsule Commonly known as:  FLOMAX Take 0.4 mg by mouth daily.   traZODone 100 MG tablet Commonly known as:  DESYREL Take 100 mg by mouth at bedtime as needed for sleep.          Total Time in preparing paper work, data evaluation and todays exam - 35 minutes  Dustin Flock M.D on 07/04/2016 at 10:17 AM  [facility]   Office  [phone]

## 2016-07-04 NOTE — Clinical Social Work Note (Signed)
Patient to discharge to [facility] today. Patient is having his daughter transport. Discharge information sent and [facility] is aware.  Shela Leff MSW,LCSW [phone]

## 2016-07-04 NOTE — Clinical Social Work Note (Signed)
Patient has a bed offer from facility of choice: [facility]. [facility] can discharge to [facility] when time. Shela Leff MSW,LCSW [phone]

## 2016-07-04 NOTE — Discharge Instructions (Signed)
Ureteral Stent Implantation, Care After Refer to this sheet in the next few weeks. These instructions provide you with information on caring for yourself after your procedure. Your health care provider may also give you more specific instructions. Your treatment has been planned according to current medical practices, but problems sometimes occur. Call your health care provider if you have any problems or questions after your procedure. WHAT TO EXPECT AFTER THE PROCEDURE You should be back to normal activity within 48 hours after the procedure. Nausea and vomiting may occur and are commonly the result of anesthesia. It is common to experience sharp pain in the back or lower abdomen and penis with voiding. This is caused by movement of the ends of the stent with the act of urinating.It usually goes away within minutes after you have stopped urinating. HOME CARE INSTRUCTIONS Make sure to drink plenty of fluids. You may have small amounts of bleeding, causing your urine to be red. This is normal. Certain movements may trigger pain or a feeling that you need to urinate. You may be given medicines to prevent infection or bladder spasms. Be sure to take all medicines as directed. Only take over-the-counter or prescription medicines for pain, discomfort, or fever as directed by your health care provider. Do not take aspirin, as this can make bleeding worse.  Your stent will be left in until the stone(s) are removed which may take a few weeks or longer. Be sure to keep all follow-up appointments so your health care provider can check that you are healing properly and to plan stent/stone removal.   SEEK MEDICAL CARE IF:  You experience increasing pain.  Your pain medicine is not working. SEEK IMMEDIATE MEDICAL CARE IF:  Your urine is dark red or has blood clots.  You are leaking urine (incontinent).  You have a fever, chills, feeling sick to your stomach (nausea), or vomiting.  Your pain is not  relieved by pain medicine.  The end of the stent comes out of the urethra.  You are unable to urinate.   This information is not intended to replace advice given to you by your health care provider. Make sure you discuss any questions you have with your health care provider.   Document Released: 07/07/2013 Document Revised: 11/09/2013 Document Reviewed: 05/19/2015 Elsevier Interactive Patient Education 2016 [employer].     DIET:  Cardiac diet  DISCHARGE CONDITION:  Stable  ACTIVITY:  Activity as tolerated  OXYGEN:  Home Oxygen: 02 via [place] 2l prn hypoxia   Oxygen Delivery: nasal canula  DISCHARGE LOCATION:  nursing home    ADDITIONAL DISCHARGE INSTRUCTION:   If you experience worsening of your admission symptoms, develop shortness of breath, life threatening emergency, suicidal or homicidal thoughts you must seek medical attention immediately by calling 911 or calling your MD immediately  if symptoms less severe.  You Must read complete instructions/literature along with all the possible adverse reactions/side effects for all the Medicines you take and that have been prescribed to you. Take any new Medicines after you have completely understood and accpet all the possible adverse reactions/side effects.   Please note  You were cared for by a hospitalist during your hospital stay. If you have any questions about your discharge medications or the care you received while you were in the hospital after you are discharged, you can call the unit and asked to speak with the hospitalist on call if the hospitalist that took care of you is not available. Once you are  discharged, your primary care physician will handle any further medical issues. Please note that NO REFILLS for any discharge medications will be authorized once you are discharged, as it is imperative that you return to your primary care physician (or establish a relationship with a primary care physician if you do not  have one) for your aftercare needs so that they can reassess your need for medications and monitor your lab values.

## 2016-07-18 ENCOUNTER — Ambulatory Visit (INDEPENDENT_AMBULATORY_CARE_PROVIDER_SITE_OTHER): Payer: Self-pay | Admitting: Urology

## 2016-07-18 VITALS — BP 121/74 | HR 84 | Ht 71.0 in | Wt 278.0 lb

## 2016-07-18 DIAGNOSIS — N201 Calculus of ureter: Secondary | ICD-10-CM

## 2016-07-18 DIAGNOSIS — N200 Calculus of kidney: Secondary | ICD-10-CM

## 2016-07-18 NOTE — Progress Notes (Signed)
07/18/2016 11:42 AM   Edwin Donovan 07/11/1946 [id number]  Referring provider: [facility]  [place], [place] [address]  Chief Complaint  Patient presents with  . Nephrolithiasis    2wk follow up    HPI: The patient is a 70 year old gentleman who recently underwent right ureteral stent placement in the setting of sepsis for a 6 mm UVJ stone.  He presents today for discussion of definitive stone management. He also has a 5 mm right renal calculus and 2 mm right ureteral stone proximal to the obstructing UVJ stone.   PMH: Past Medical History:  Diagnosis Date  . Cancer ([place])    PROSTATE  . COPD (chronic obstructive pulmonary disease) ([place])   . Hypertension     Surgical History: Past Surgical History:  Procedure Laterality Date  . CYSTOSCOPY WITH STENT PLACEMENT Right 06/28/2016   Procedure: CYSTOSCOPY WITH STENT PLACEMENT;  Surgeon: Hollice Espy, MD;  Location: [facility] ORS;  Service: Urology;  Laterality: Right;  . PROSTATECTOMY      Home Medications:    Medication List       Accurate as of 07/18/16 11:42 AM. Always use your most recent med list.          albuterol 108 (90 Base) MCG/ACT inhaler Commonly known as:  PROVENTIL HFA;VENTOLIN HFA Inhale 1-2 puffs into the lungs every 6 (six) hours as needed for wheezing or shortness of breath.   amLODipine 10 MG tablet Commonly known as:  NORVASC Take 10 mg by mouth daily.   budesonide-formoterol 160-4.5 MCG/ACT inhaler Commonly known as:  SYMBICORT Inhale 2 puffs into the lungs 2 (two) times daily.   docusate sodium 100 MG capsule Commonly known as:  COLACE Take 1 capsule (100 mg total) by mouth 2 (two) times daily.   FLUoxetine 20 MG capsule Commonly known as:  PROZAC Take 40 mg by mouth daily.   ibuprofen 600 MG tablet Commonly known as:  ADVIL,MOTRIN Take 600 mg by mouth every 6 (six) hours as needed for mild pain or moderate pain.   oxyCODONE 5 MG immediate release  tablet Commonly known as:  Oxy IR/ROXICODONE Take 1 tablet (5 mg total) by mouth every 4 (four) hours as needed for moderate pain.   tamsulosin 0.4 MG Caps capsule Commonly known as:  FLOMAX Take 0.4 mg by mouth daily.   traZODone 100 MG tablet Commonly known as:  DESYREL Take 100 mg by mouth at bedtime as needed for sleep.       Allergies: No Known Allergies  Family History: Family History  Problem Relation Age of Onset  . Stroke Mother   . Heart attack Father     Social History:  reports that he has quit smoking. He has never used smokeless tobacco. He reports that he uses drugs, including Marijuana. He reports that he does not drink alcohol.  ROS: UROLOGY Frequent Urination?: Yes Hard to postpone urination?: No Burning/pain with urination?: Yes Get up at night to urinate?: No Leakage of urine?: Yes Urine stream starts and stops?: No Trouble starting stream?: Yes Do you have to strain to urinate?: Yes Blood in urine?: No Urinary tract infection?: No Sexually transmitted disease?: No Injury to kidneys or bladder?: No Painful intercourse?: No Weak stream?: No Erection problems?: No Penile pain?: No  Gastrointestinal Nausea?: No Vomiting?: No Indigestion/heartburn?: No Diarrhea?: No Constipation?: No  Constitutional Fever: No Night sweats?: No Weight loss?: No Fatigue?: No  Skin Skin rash/lesions?: No Itching?: No  Eyes  Blurred vision?: No Double vision?: No  Ears/Nose/Throat Sore throat?: No Sinus problems?: No  Hematologic/Lymphatic Swollen glands?: No Easy bruising?: No  Cardiovascular Leg swelling?: Yes Chest pain?: No  Respiratory Cough?: No Shortness of breath?: Yes  Endocrine Excessive thirst?: No  Musculoskeletal Back pain?: No Joint pain?: No  Neurological Headaches?: No Dizziness?: No  Psychologic Depression?: No Anxiety?: No  Physical Exam: BP 121/74   Pulse 84   Ht '5\' 11"'$  (1.803 m)   Wt 278 lb (126.1 kg)    BMI 38.77 kg/m   Constitutional:  Alert and oriented, No acute distress. HEENT: [place] AT, moist mucus membranes.  Trachea midline, no masses. Cardiovascular: No clubbing, cyanosis, or edema. Respiratory: Normal respiratory effort, no increased work of breathing. GI: Abdomen is soft, nontender, nondistended, no abdominal masses GU: No CVA tenderness.  Skin: No rashes, bruises or suspicious lesions. Lymph: No cervical or inguinal adenopathy. Neurologic: Grossly intact, no focal deficits, moving all 4 extremities. Psychiatric: Normal mood and affect.  Laboratory Data: Lab Results  Component Value Date   WBC 12.7 (H) 07/02/2016   HGB 14.2 07/02/2016   HCT 41.9 07/02/2016   MCV 92.8 07/02/2016   PLT 95 (L) 07/02/2016    Lab Results  Component Value Date   CREATININE 0.99 07/02/2016    No results found for: PSA  No results found for: TESTOSTERONE  Lab Results  Component Value Date   HGBA1C 6.0 06/28/2016    Urinalysis    Component Value Date/Time   COLORURINE AMBER (A) 06/27/2016 2122   APPEARANCEUR CLOUDY (A) 06/27/2016 2122   LABSPEC 1.017 06/27/2016 2122   PHURINE 5.0 06/27/2016 2122   GLUCOSEU NEGATIVE 06/27/2016 2122   HGBUR 3+ (A) 06/27/2016 2122   BILIRUBINUR NEGATIVE 06/27/2016 2122   [place] NEGATIVE 06/27/2016 2122   PROTEINUR 100 (A) 06/27/2016 2122   NITRITE NEGATIVE 06/27/2016 2122   LEUKOCYTESUR TRACE (A) 06/27/2016 2122     Assessment & Plan:    1. Right ureteral calculus x2 2. Right renal calculus I discussed the patient cystoscopy, right ureteroscopy, laser lithotripsy, and right ureteral stent exchange. He understands ago will be to first remove his stones in his right ureter. Since he also has a nonobstructing 5 mm stone on the right side, we'll also attempt to address this stone at that time. He understands the risks, benefits, and indications of this procedure. He understands the risks include but are not limited to bleeding, infection,  iatrogenic injury, need for repeat procedures. All questions were answered. The patient is agreeable to proceeding.   Nickie Retort, MD  [facility] [address], [place], [place] [address] [phone]

## 2016-07-19 ENCOUNTER — Telehealth: Payer: Self-pay | Admitting: Radiology

## 2016-07-19 ENCOUNTER — Other Ambulatory Visit: Payer: Self-pay | Admitting: Radiology

## 2016-07-19 DIAGNOSIS — N200 Calculus of kidney: Secondary | ICD-10-CM

## 2016-07-19 NOTE — Telephone Encounter (Signed)
Notified Betty at [facility] of surgery scheduled 07/29/2016 with  Pre-admit testing appointment on 07/23/2016 at 1:45 PM, and to call Friday prior to surgery for arrival time to same day surgery.Inez Catalina voices understanding.

## 2016-07-21 LAB — CULTURE, URINE COMPREHENSIVE

## 2016-07-23 ENCOUNTER — Ambulatory Visit
Admission: RE | Admit: 2016-07-23 | Discharge: 2016-07-23 | Disposition: A | Discharge status: Home / Self Care | Payer: No Typology Code available for payment source | Source: Ambulatory Visit | Attending: Anesthesiology | Admitting: Anesthesiology

## 2016-07-23 ENCOUNTER — Telehealth: Payer: Self-pay | Admitting: Urology

## 2016-07-23 ENCOUNTER — Encounter
Admission: RE | Admit: 2016-07-23 | Discharge: 2016-07-23 | Disposition: A | Discharge status: Home / Self Care | Payer: No Typology Code available for payment source | Source: Ambulatory Visit | Attending: Urology | Admitting: Urology

## 2016-07-23 DIAGNOSIS — Z8701 Personal history of pneumonia (recurrent): Secondary | ICD-10-CM | POA: Diagnosis not present

## 2016-07-23 DIAGNOSIS — J189 Pneumonia, unspecified organism: Secondary | ICD-10-CM

## 2016-07-23 DIAGNOSIS — Z01810 Encounter for preprocedural cardiovascular examination: Secondary | ICD-10-CM | POA: Insufficient documentation

## 2016-07-23 HISTORY — DX: Pneumonia, unspecified organism: J18.9

## 2016-07-23 HISTORY — DX: Reserved for inherently not codable concepts without codable children: IMO0001

## 2016-07-23 HISTORY — DX: Anxiety disorder, unspecified: F41.9

## 2016-07-23 HISTORY — DX: Chronic kidney disease, unspecified: N18.9

## 2016-07-23 NOTE — Patient Instructions (Signed)
  Your procedure is scheduled on: 07/29/16 Report to Day Surgery. [facility] SECOND FLOOR To find out your arrival time please call [phone] between 1PM - 3PM on 07/26/16  Remember: Instructions that are not followed completely may result in serious medical risk, up to and including death, or upon the discretion of your surgeon and anesthesiologist your surgery may need to be rescheduled.    __X__ 1. Do not eat food or drink liquids after midnight. No gum chewing or hard candies.     __X__ 2. No Alcohol for 24 hours before or after surgery.   ___X_ 3. Do Not Smoke For 24 Hours Prior to Your Surgery.   ____ 4. Bring all medications with you on the day of surgery if instructed.    __X__ 5. Notify your doctor if there is any change in your medical condition     (cold, fever, infections).       Do not wear jewelry, make-up, hairpins, clips or nail polish.  Do not wear lotions, powders, or perfumes. You may wear deodorant.  Do not shave 48 hours prior to surgery. Men may shave face and neck.  Do not bring valuables to the hospital.    [facility] is not responsible for any belongings or valuables.               Contacts, dentures or bridgework may not be worn into surgery.  Leave your suitcase in the car. After surgery it may be brought to your room.  For patients admitted to the hospital, discharge time is determined by your                treatment team.   Patients discharged the day of surgery will not be allowed to drive home.   Please read over the following fact sheets that you were given:   Surgical Site Infection Prevention   _X___ Take these medicines the morning of surgery with A SIP OF WATER:    1.AMLODIPINE  2. FLUOXETINE   3.   4.  5.  6.  ____ Fleet Enema (as directed)   ____ Use CHG Soap as directed  _X___ Use inhalers on the day of surgery  ____ Stop metformin 2 days prior to surgery    ____ Take 1/2 of usual insulin dose the night before surgery and  none on the morning of surgery.   ____ Stop Coumadin/Plavix/aspirin on   ____ Stop Anti-inflammatories on   ____ Stop supplements until after surgery.    ____ Bring C-Pap to the hospital.

## 2016-07-23 NOTE — Pre-Procedure Instructions (Signed)
REQUESTED CXR SINCE RECENT PNEUMONIA. ALSO LM FOR KIM AT [facility] TO FAX H/P/LAST NOTE FROM DR Lovie Macadamia  TO ANESTHESIA.  PATIENT STATES NO LONGER WEARING O2 X 3 DAYS. STATED O2 SAT 92 TO 95% ON O2. OS SAT TODAY ROOM AIR AT PREOP 92%

## 2016-07-24 NOTE — Telephone Encounter (Signed)
Notified Edwin Donovan that pt's PCP will need to be contacted regarding pt's discharge to home with home health. Edwin voices understanding.

## 2016-07-24 NOTE — Telephone Encounter (Signed)
No, this needs to come from his PCP.  Hollice Espy, MD

## 2016-07-29 ENCOUNTER — Encounter
Admission: RE | Disposition: A | Discharge status: Home / Self Care | Payer: Self-pay | Source: Ambulatory Visit | Attending: Urology

## 2016-07-29 ENCOUNTER — Ambulatory Visit: Payer: Non-veteran care | Admitting: Anesthesiology

## 2016-07-29 ENCOUNTER — Ambulatory Visit
Admission: RE | Admit: 2016-07-29 | Discharge: 2016-07-29 | Disposition: A | Discharge status: Home / Self Care | Payer: Non-veteran care | Source: Ambulatory Visit | Attending: Urology | Admitting: Urology

## 2016-07-29 ENCOUNTER — Encounter: Payer: Self-pay | Admitting: *Deleted

## 2016-07-29 DIAGNOSIS — Z87891 Personal history of nicotine dependence: Secondary | ICD-10-CM | POA: Diagnosis not present

## 2016-07-29 DIAGNOSIS — Z79899 Other long term (current) drug therapy: Secondary | ICD-10-CM | POA: Insufficient documentation

## 2016-07-29 DIAGNOSIS — Z8546 Personal history of malignant neoplasm of prostate: Secondary | ICD-10-CM | POA: Insufficient documentation

## 2016-07-29 DIAGNOSIS — N201 Calculus of ureter: Secondary | ICD-10-CM | POA: Insufficient documentation

## 2016-07-29 DIAGNOSIS — N202 Calculus of kidney with calculus of ureter: Secondary | ICD-10-CM | POA: Diagnosis present

## 2016-07-29 DIAGNOSIS — Z8249 Family history of ischemic heart disease and other diseases of the circulatory system: Secondary | ICD-10-CM | POA: Diagnosis not present

## 2016-07-29 DIAGNOSIS — N2889 Other specified disorders of kidney and ureter: Secondary | ICD-10-CM | POA: Diagnosis not present

## 2016-07-29 DIAGNOSIS — Z823 Family history of stroke: Secondary | ICD-10-CM | POA: Insufficient documentation

## 2016-07-29 DIAGNOSIS — N200 Calculus of kidney: Secondary | ICD-10-CM

## 2016-07-29 DIAGNOSIS — D4111 Neoplasm of uncertain behavior of right renal pelvis: Secondary | ICD-10-CM | POA: Diagnosis not present

## 2016-07-29 DIAGNOSIS — J449 Chronic obstructive pulmonary disease, unspecified: Secondary | ICD-10-CM | POA: Diagnosis not present

## 2016-07-29 DIAGNOSIS — I10 Essential (primary) hypertension: Secondary | ICD-10-CM | POA: Insufficient documentation

## 2016-07-29 HISTORY — PX: CYSTOSCOPY W/ URETERAL STENT PLACEMENT: SHX1429

## 2016-07-29 HISTORY — PX: URETEROSCOPY: SHX842

## 2016-07-29 HISTORY — PX: CYSTOSCOPY WITH BIOPSY: SHX5122

## 2016-07-29 LAB — URINE DRUG SCREEN, QUALITATIVE (ARMC ONLY)
Amphetamines, Ur Screen: NOT DETECTED
BARBITURATES, UR SCREEN: NOT DETECTED
Benzodiazepine, Ur Scrn: NOT DETECTED
COCAINE METABOLITE, UR ~~LOC~~: NOT DETECTED
Cannabinoid 50 Ng, Ur ~~LOC~~: POSITIVE — AB
MDMA (ECSTASY) UR SCREEN: NOT DETECTED
METHADONE SCREEN, URINE: NOT DETECTED
OPIATE, UR SCREEN: NOT DETECTED
Phencyclidine (PCP) Ur S: NOT DETECTED
Tricyclic, Ur Screen: NOT DETECTED

## 2016-07-29 SURGERY — URETEROSCOPY
Anesthesia: General | Site: Renal | Laterality: Right | Wound class: Clean Contaminated

## 2016-07-29 MED ORDER — TAMSULOSIN HCL 0.4 MG PO CAPS: 0.4000 mg | ORAL_CAPSULE | Freq: Every day | ORAL | 0 refills | Status: DC

## 2016-07-29 MED ORDER — FENTANYL CITRATE (PF) 100 MCG/2ML IJ SOLN: 25.0000 ug | INTRAMUSCULAR | Status: DC | PRN

## 2016-07-29 MED ORDER — FENTANYL CITRATE (PF) 100 MCG/2ML IJ SOLN
INTRAMUSCULAR | Status: AC
  Filled 2016-07-29: qty 4

## 2016-07-29 MED ORDER — VANCOMYCIN HCL IN DEXTROSE 1-5 GM/200ML-% IV SOLN
1000.0000 mg | Freq: Once | INTRAVENOUS | Status: AC
  Administered 2016-07-29: 1000 mg via INTRAVENOUS

## 2016-07-29 MED ORDER — OXYCODONE HCL 5 MG PO TABS: 5.0000 mg | ORAL_TABLET | ORAL | 0 refills | Status: DC | PRN

## 2016-07-29 MED ORDER — EPHEDRINE SULFATE 50 MG/ML IJ SOLN
INTRAMUSCULAR | Status: DC | PRN
  Administered 2016-07-29: 5 mg via INTRAVENOUS
  Administered 2016-07-29: 10 mg via INTRAVENOUS
  Administered 2016-07-29: 5 mg via INTRAVENOUS

## 2016-07-29 MED ORDER — LIDOCAINE HCL (CARDIAC) 20 MG/ML IV SOLN
INTRAVENOUS | Status: DC | PRN
  Administered 2016-07-29: 50 mg via INTRAVENOUS

## 2016-07-29 MED ORDER — FENTANYL CITRATE (PF) 100 MCG/2ML IJ SOLN
INTRAMUSCULAR | Status: DC | PRN
  Administered 2016-07-29: 25 ug via INTRAVENOUS
  Administered 2016-07-29 (×3): 50 ug via INTRAVENOUS
  Administered 2016-07-29: 25 ug via INTRAVENOUS

## 2016-07-29 MED ORDER — GENTAMICIN SULFATE 40 MG/ML IJ SOLN
120.0000 mg | INTRAVENOUS | Status: AC
  Administered 2016-07-29: 120 mg via INTRAVENOUS
  Filled 2016-07-29: qty 3

## 2016-07-29 MED ORDER — PROPOFOL 10 MG/ML IV BOLUS
INTRAVENOUS | Status: DC | PRN
  Administered 2016-07-29: 50 mg via INTRAVENOUS
  Administered 2016-07-29: 200 mg via INTRAVENOUS

## 2016-07-29 MED ORDER — IOTHALAMATE MEGLUMINE 43 % IV SOLN
INTRAVENOUS | Status: DC | PRN
  Administered 2016-07-29: 35 mL

## 2016-07-29 MED ORDER — VANCOMYCIN HCL IN DEXTROSE 1-5 GM/200ML-% IV SOLN
INTRAVENOUS | Status: AC
  Filled 2016-07-29: qty 200

## 2016-07-29 MED ORDER — ONDANSETRON HCL 4 MG/2ML IJ SOLN: 4.0000 mg | Freq: Once | INTRAMUSCULAR | Status: DC | PRN

## 2016-07-29 MED ORDER — FAMOTIDINE 20 MG PO TABS
20.0000 mg | ORAL_TABLET | Freq: Once | ORAL | Status: AC
Start: 2016-07-29 — End: 2016-07-29
  Administered 2016-07-29: 20 mg via ORAL

## 2016-07-29 MED ORDER — SODIUM CHLORIDE 0.9 % IV SOLN
INTRAVENOUS | Status: DC | PRN
Start: 2016-07-29 — End: 2016-07-29
  Administered 2016-07-29: 50 ug/min via INTRAVENOUS

## 2016-07-29 MED ORDER — FAMOTIDINE 20 MG PO TABS
ORAL_TABLET | ORAL | Status: AC
  Administered 2016-07-29: 20 mg via ORAL
  Filled 2016-07-29: qty 1

## 2016-07-29 MED ORDER — SODIUM CHLORIDE 0.9 % IV SOLN
2.0000 g | INTRAVENOUS | Status: AC
  Administered 2016-07-29: 2 g via INTRAVENOUS
  Filled 2016-07-29: qty 2000

## 2016-07-29 MED ORDER — PHENYLEPHRINE HCL 10 MG/ML IJ SOLN
INTRAMUSCULAR | Status: DC | PRN
  Administered 2016-07-29 (×3): 200 ug via INTRAVENOUS
  Administered 2016-07-29: 100 ug via INTRAVENOUS
  Administered 2016-07-29 (×2): 200 ug via INTRAVENOUS

## 2016-07-29 MED ORDER — LACTATED RINGERS IV SOLN
INTRAVENOUS | Status: DC
  Administered 2016-07-29: 13:00:00 via INTRAVENOUS

## 2016-07-29 MED ORDER — ONDANSETRON HCL 4 MG/2ML IJ SOLN
INTRAMUSCULAR | Status: DC | PRN
  Administered 2016-07-29: 4 mg via INTRAVENOUS

## 2016-07-29 SURGICAL SUPPLY — 36 items
ADAPTER SCOPE UROLOK II (MISCELLANEOUS) IMPLANT
BAG DRAIN CYSTO-URO LG1000N (MISCELLANEOUS) ×3 IMPLANT
BASKET 3WIRE GEMINI 2.4X120 (MISCELLANEOUS) ×3 IMPLANT
BASKET ZERO TIP 1.9FR (BASKET) IMPLANT
BRUSH CYTOLOGY 3FR 115L (MISCELLANEOUS) ×3 IMPLANT
CATH FOL 2WAY LX 16X5 (CATHETERS) IMPLANT
CATH URETL 5X70 OPEN END (CATHETERS) ×3 IMPLANT
CNTNR SPEC 2.5X3XGRAD LEK (MISCELLANEOUS) ×2
CONRAY 43 FOR UROLOGY 50M (MISCELLANEOUS) ×3 IMPLANT
CONT SPEC 4OZ STER OR WHT (MISCELLANEOUS) ×1
CONTAINER SPEC 2.5X3XGRAD LEK (MISCELLANEOUS) ×2 IMPLANT
DRAPE UTILITY 15X26 TOWEL STRL (DRAPES) ×3 IMPLANT
DRESSING TELFA 4X3 1S ST N-ADH (GAUZE/BANDAGES/DRESSINGS) ×3 IMPLANT
FIBER LASER LITHO 273 (Laser) IMPLANT
GLOVE BIO SURGEON STRL SZ 6.5 (GLOVE) ×3 IMPLANT
GOWN STRL REUS W/ TWL LRG LVL3 (GOWN DISPOSABLE) ×4 IMPLANT
GOWN STRL REUS W/ TWL LRG LVL4 (GOWN DISPOSABLE) ×4 IMPLANT
GOWN STRL REUS W/TWL LRG LVL3 (GOWN DISPOSABLE) ×2
GOWN STRL REUS W/TWL LRG LVL4 (GOWN DISPOSABLE) ×2
GUIDEWIRE SUPER STIFF (WIRE) ×3 IMPLANT
HOLDER FOLEY CATH W/STRAP (MISCELLANEOUS) IMPLANT
INTRODUCER DILATOR DOUBLE (INTRODUCER) ×3 IMPLANT
KIT RM TURNOVER CYSTO AR (KITS) ×3 IMPLANT
NEEDLE HYPO 22GX1.5 SAFETY (NEEDLE) ×3 IMPLANT
PACK CYSTO AR (MISCELLANEOUS) ×3 IMPLANT
SENSORWIRE 0.038 NOT ANGLED (WIRE) ×3
SET CYSTO W/LG BORE CLAMP LF (SET/KITS/TRAYS/PACK) ×3 IMPLANT
SHEATH URETERAL 12FRX35CM (MISCELLANEOUS) IMPLANT
SOL .9 NS 3000ML IRR  AL (IV SOLUTION) ×1
SOL .9 NS 3000ML IRR UROMATIC (IV SOLUTION) ×2 IMPLANT
STENT URET 6FRX24 CONTOUR (STENTS) IMPLANT
STENT URET 6FRX26 CONTOUR (STENTS) ×3 IMPLANT
SURGILUBE 2OZ TUBE FLIPTOP (MISCELLANEOUS) ×3 IMPLANT
SYRINGE IRR TOOMEY STRL 70CC (SYRINGE) ×3 IMPLANT
WATER STERILE IRR 1000ML POUR (IV SOLUTION) ×3 IMPLANT
WIRE SENSOR 0.038 NOT ANGLED (WIRE) ×2 IMPLANT

## 2016-07-29 NOTE — H&P (View-Only) (Signed)
07/18/2016 11:42 AM   Edwin Donovan 01/11/1946 [id number]  Referring provider: [facility]  [place], [place] [address]  Chief Complaint  Patient presents with  . Nephrolithiasis    2wk follow up    HPI: The patient is a 70 year old gentleman who recently underwent right ureteral stent placement in the setting of sepsis for a 6 mm UVJ stone.  He presents today for discussion of definitive stone management. He also has a 5 mm right renal calculus and 2 mm right ureteral stone proximal to the obstructing UVJ stone.   PMH: Past Medical History:  Diagnosis Date  . Cancer ([place])    PROSTATE  . COPD (chronic obstructive pulmonary disease) ([place])   . Hypertension     Surgical History: Past Surgical History:  Procedure Laterality Date  . CYSTOSCOPY WITH STENT PLACEMENT Right 06/28/2016   Procedure: CYSTOSCOPY WITH STENT PLACEMENT;  Surgeon: Hollice Espy, MD;  Location: [facility] ORS;  Service: Urology;  Laterality: Right;  . PROSTATECTOMY      Home Medications:    Medication List       Accurate as of 07/18/16 11:42 AM. Always use your most recent med list.          albuterol 108 (90 Base) MCG/ACT inhaler Commonly known as:  PROVENTIL HFA;VENTOLIN HFA Inhale 1-2 puffs into the lungs every 6 (six) hours as needed for wheezing or shortness of breath.   amLODipine 10 MG tablet Commonly known as:  NORVASC Take 10 mg by mouth daily.   budesonide-formoterol 160-4.5 MCG/ACT inhaler Commonly known as:  SYMBICORT Inhale 2 puffs into the lungs 2 (two) times daily.   docusate sodium 100 MG capsule Commonly known as:  COLACE Take 1 capsule (100 mg total) by mouth 2 (two) times daily.   FLUoxetine 20 MG capsule Commonly known as:  PROZAC Take 40 mg by mouth daily.   ibuprofen 600 MG tablet Commonly known as:  ADVIL,MOTRIN Take 600 mg by mouth every 6 (six) hours as needed for mild pain or moderate pain.   oxyCODONE 5 MG immediate release  tablet Commonly known as:  Oxy IR/ROXICODONE Take 1 tablet (5 mg total) by mouth every 4 (four) hours as needed for moderate pain.   tamsulosin 0.4 MG Caps capsule Commonly known as:  FLOMAX Take 0.4 mg by mouth daily.   traZODone 100 MG tablet Commonly known as:  DESYREL Take 100 mg by mouth at bedtime as needed for sleep.       Allergies: No Known Allergies  Family History: Family History  Problem Relation Age of Onset  . Stroke Mother   . Heart attack Father     Social History:  reports that he has quit smoking. He has never used smokeless tobacco. He reports that he uses drugs, including Marijuana. He reports that he does not drink alcohol.  ROS: UROLOGY Frequent Urination?: Yes Hard to postpone urination?: No Burning/pain with urination?: Yes Get up at night to urinate?: No Leakage of urine?: Yes Urine stream starts and stops?: No Trouble starting stream?: Yes Do you have to strain to urinate?: Yes Blood in urine?: No Urinary tract infection?: No Sexually transmitted disease?: No Injury to kidneys or bladder?: No Painful intercourse?: No Weak stream?: No Erection problems?: No Penile pain?: No  Gastrointestinal Nausea?: No Vomiting?: No Indigestion/heartburn?: No Diarrhea?: No Constipation?: No  Constitutional Fever: No Night sweats?: No Weight loss?: No Fatigue?: No  Skin Skin rash/lesions?: No Itching?: No  Eyes  Blurred vision?: No Double vision?: No  Ears/Nose/Throat Sore throat?: No Sinus problems?: No  Hematologic/Lymphatic Swollen glands?: No Easy bruising?: No  Cardiovascular Leg swelling?: Yes Chest pain?: No  Respiratory Cough?: No Shortness of breath?: Yes  Endocrine Excessive thirst?: No  Musculoskeletal Back pain?: No Joint pain?: No  Neurological Headaches?: No Dizziness?: No  Psychologic Depression?: No Anxiety?: No  Physical Exam: BP 121/74   Pulse 84   Ht '5\' 11"'$  (1.803 m)   Wt 278 lb (126.1 kg)    BMI 38.77 kg/m   Constitutional:  Alert and oriented, No acute distress. HEENT: [place] AT, moist mucus membranes.  Trachea midline, no masses. Cardiovascular: No clubbing, cyanosis, or edema. Respiratory: Normal respiratory effort, no increased work of breathing. GI: Abdomen is soft, nontender, nondistended, no abdominal masses GU: No CVA tenderness.  Skin: No rashes, bruises or suspicious lesions. Lymph: No cervical or inguinal adenopathy. Neurologic: Grossly intact, no focal deficits, moving all 4 extremities. Psychiatric: Normal mood and affect.  Laboratory Data: Lab Results  Component Value Date   WBC 12.7 (H) 07/02/2016   HGB 14.2 07/02/2016   HCT 41.9 07/02/2016   MCV 92.8 07/02/2016   PLT 95 (L) 07/02/2016    Lab Results  Component Value Date   CREATININE 0.99 07/02/2016    No results found for: PSA  No results found for: TESTOSTERONE  Lab Results  Component Value Date   HGBA1C 6.0 06/28/2016    Urinalysis    Component Value Date/Time   COLORURINE AMBER (A) 06/27/2016 2122   APPEARANCEUR CLOUDY (A) 06/27/2016 2122   LABSPEC 1.017 06/27/2016 2122   PHURINE 5.0 06/27/2016 2122   GLUCOSEU NEGATIVE 06/27/2016 2122   HGBUR 3+ (A) 06/27/2016 2122   BILIRUBINUR NEGATIVE 06/27/2016 2122   [place] NEGATIVE 06/27/2016 2122   PROTEINUR 100 (A) 06/27/2016 2122   NITRITE NEGATIVE 06/27/2016 2122   LEUKOCYTESUR TRACE (A) 06/27/2016 2122     Assessment & Plan:    1. Right ureteral calculus x2 2. Right renal calculus I discussed the patient cystoscopy, right ureteroscopy, laser lithotripsy, and right ureteral stent exchange. He understands ago will be to first remove his stones in his right ureter. Since he also has a nonobstructing 5 mm stone on the right side, we'll also attempt to address this stone at that time. He understands the risks, benefits, and indications of this procedure. He understands the risks include but are not limited to bleeding, infection,  iatrogenic injury, need for repeat procedures. All questions were answered. The patient is agreeable to proceeding.   Nickie Retort, MD  [facility] [address], [place], [place] [address] [phone]

## 2016-07-29 NOTE — Anesthesia Preprocedure Evaluation (Signed)
Anesthesia Evaluation  Patient identified by MRN, date of birth, ID band Patient awake    Reviewed: Allergy & Precautions, H&P , NPO status , Patient's Chart, lab work & pertinent test results, reviewed documented beta blocker date and time   Airway Mallampati: II  TM Distance: >3 FB Neck ROM: full    Dental  (+) Teeth Intact   Pulmonary neg pulmonary ROS, shortness of breath and with exertion, pneumonia, resolved, COPD,  COPD inhaler, former smoker,    Pulmonary exam normal        Cardiovascular Exercise Tolerance: Good hypertension, On Medications negative cardio ROS Normal cardiovascular exam Rhythm:regular Rate:Normal     Neuro/Psych negative neurological ROS  negative psych ROS   GI/Hepatic negative GI ROS, Neg liver ROS,   Endo/Other  negative endocrine ROS  Renal/GU Renal diseasenegative Renal ROS  negative genitourinary   Musculoskeletal   Abdominal   Peds  Hematology negative hematology ROS (+)   Anesthesia Other Findings   Reproductive/Obstetrics negative OB ROS                             Anesthesia Physical Anesthesia Plan  ASA: III  Anesthesia Plan: General LMA   Post-op Pain Management:    Induction:   Airway Management Planned:   Additional Equipment:   Intra-op Plan:   Post-operative Plan:   Informed Consent: I have reviewed the patients History and Physical, chart, labs and discussed the procedure including the risks, benefits and alternatives for the proposed anesthesia with the patient or authorized representative who has indicated his/her understanding and acceptance.     Plan Discussed with: CRNA  Anesthesia Plan Comments:         Anesthesia Quick Evaluation

## 2016-07-29 NOTE — Transfer of Care (Signed)
Immediate Anesthesia Transfer of Care Note  Patient: Edwin Donovan.  Procedure(s) Performed: Procedure(s): URETEROSCOPY (Right) CYSTOSCOPY WITH STENT REPLACEMENT (Right) CYSTOSCOPY WITH BIOPSY (Right)  Patient Location: PACU  Anesthesia Type:General  Level of Consciousness: sedated  Airway & Oxygen Therapy: Patient Spontanous Breathing and Patient connected to face mask oxygen  Post-op Assessment: Report given to RN and Post -op Vital signs reviewed and stable  Post vital signs: Reviewed and stable  Last Vitals:  Vitals:   07/29/16 1233  BP: 131/77  Pulse: 92  Resp: 20  Temp: 36.6 C    Last Pain:  Vitals:   07/29/16 1233  TempSrc: Oral  PainSc: 2          Complications: No apparent anesthesia complications

## 2016-07-29 NOTE — Anesthesia Postprocedure Evaluation (Signed)
Anesthesia Post Note  Patient: Edwin Donovan.  Procedure(s) Performed: Procedure(s) (LRB): URETEROSCOPY (Right) CYSTOSCOPY WITH STENT REPLACEMENT (Right) CYSTOSCOPY WITH BIOPSY (Right)  Patient location during evaluation: PACU Anesthesia Type: General Level of consciousness: awake and alert and oriented Pain management: pain level controlled Vital Signs Assessment: post-procedure vital signs reviewed and stable Respiratory status: spontaneous breathing, nonlabored ventilation and respiratory function stable Cardiovascular status: blood pressure returned to baseline and stable Postop Assessment: no signs of nausea or vomiting Anesthetic complications: no    Last Vitals:  Vitals:   07/29/16 1649 07/29/16 1704  BP: 124/79 134/84  Pulse: 95 92  Resp: 14 16  Temp: 36.2 C     Last Pain:  Vitals:   07/29/16 1649  TempSrc:   PainSc: 0-No pain                 Amous Crewe

## 2016-07-29 NOTE — Interval H&P Note (Signed)
History and Physical Interval Note:  07/29/2016 2:56 PM  Edwin Donovan.  has presented today for surgery, with the diagnosis of NEPHROLITHIASIS  The various methods of treatment have been discussed with the patient and family. After consideration of risks, benefits and other options for treatment, the patient has consented to  Procedure(s): URETEROSCOPY WITH HOLMIUM LASER LITHOTRIPSY (Right) CYSTOSCOPY WITH STENT REPLACEMENT (Right) as a surgical intervention .  The patient's history has been reviewed, patient examined, no change in status, stable for surgery.  I have reviewed the patient's chart and labs.  Questions were answered to the patient's satisfaction.    RRR CTAB   Hollice Espy

## 2016-07-29 NOTE — Anesthesia Procedure Notes (Signed)
Procedure Name: LMA Insertion Performed by: Lance Muss Pre-anesthesia Checklist: Patient identified, Patient being monitored, Timeout performed, Emergency Drugs available and Suction available Patient Re-evaluated:Patient Re-evaluated prior to inductionOxygen Delivery Method: Circle system utilized Preoxygenation: Pre-oxygenation with 100% oxygen Intubation Type: IV induction LMA: LMA inserted LMA Size: 5.0 Tube type: Oral Number of attempts: 1 Placement Confirmation: positive ETCO2 and breath sounds checked- equal and bilateral Tube secured with: Tape Dental Injury: Teeth and Oropharynx as per pre-operative assessment

## 2016-07-29 NOTE — Discharge Instructions (Signed)
You have a ureteral stent in place.  This is a tube that extends from your kidney to your bladder.  This may cause urinary bleeding, burning with urination, and urinary frequency.  Please call our office or present to the ED if you develop fevers >101 or pain which is not able to be controlled with oral pain medications.  You may be given either Flomax and/ or ditropan to help with bladder spasms and stent pain in addition to pain medications.   ° °[facility] °[address], [address] °[place], [place] [address] °[phone] ° ° ° °AMBULATORY SURGERY  °DISCHARGE INSTRUCTIONS ° ° °1) The drugs that you were given will stay in your system until tomorrow so for the next 24 hours you should not: ° °A) Drive an automobile °B) Make any legal decisions °C) Drink any alcoholic beverage ° ° °2) You may resume regular meals tomorrow.  Today it is better to start with liquids and gradually work up to solid foods. ° °You may eat anything you prefer, but it is better to start with liquids, then soup and crackers, and gradually work up to solid foods. ° ° °3) Please notify your doctor immediately if you have any unusual bleeding, trouble breathing, redness and pain at the surgery site, drainage, fever, or pain not relieved by medication. ° ° ° °4) Additional Instructions: ° ° ° ° ° ° ° °Please contact your physician with any problems or Same Day Surgery at [phone], Monday through Friday 6 am to 4 pm, or [place] [facility] [place] Main number at [phone]. °

## 2016-07-29 NOTE — Op Note (Signed)
Date of procedure: 07/29/16  Preoperative diagnosis:  1. Right ureteral stones 2. History of urosepsis    Postoperative diagnosis:  1. Same as above  2. Right upper tract urothelial lesion/ tumor  Procedure: 1. Right ureteroscopy 2. Right renal pelvic biopsy 3. Right retrograde pyelogram 4. Right ureteral stent exchange  Surgeon: Hollice Espy, MD  Anesthesia: General  Complications: None  Intraoperative findings: No ureteral stone identified. Contrast extravasation initially with right retrograde pyelogram of unclear etiology, resolved. Lesion within upper tract collecting system mildly suspicious for low-grade TCC.  EBL: Minimal  Specimens: Right upper tract cytology, right upper tract lesion  Drains: 6 x 26 French double-J ureteral stent on right  Indication: Edwin Donovan. is a 70 y.o. patient with right UVJ stone who underwent emergent right ureteral stent placement in the setting of urosepsis. He returns today for definitive management of his stone.  After reviewing the management options for treatment, he elected to proceed with the above surgical procedure(s). We have discussed the potential benefits and risks of the procedure, side effects of the proposed treatment, the likelihood of the patient achieving the goals of the procedure, and any potential problems that might occur during the procedure or recuperation. Informed consent has been obtained.  Description of procedure:  The patient was taken to the operating room and general anesthesia was induced.  The patient was placed in the dorsal lithotomy position, prepped and draped in the usual sterile fashion, and preoperative antibiotics were administered. A preoperative time-out was performed.   At this point time, a rigid 21 French cystoscope was advanced per urethra into the bladder. A ureteral stent was seen emanating from the right distal UO within the bladder. The distal coil was grasped and brought to  level of the urethral meatus. The stent was then cannulated using a sensor wire up to level of the kidney without difficulty. This stent was removed and the wire was snapped in place as a safety wire. This point in time, a semirigid ureteroscope was advanced through the distal ureter all the way up to the proximal ureter without difficulty. This was quite atraumatic and no stones were identified within the ureter itself. A Super Stiff wire was then advanced through this up to level of the renal pelvis under fluoroscopic guidance without difficulty. The semirigid was removed and a 7 French flexible ureteroscope was advanced easily over the Super Stiff wire into the renal pelvis. The wire  was then withdrawn leaving the safety wire excluded from the ureteroscope. This point time, a formal pyeloscopy was then performed. No stones were identified despite having seen a small right upper tract nonobstructing stone. Unfortunately, there is a area approximately 0.5- 1 cm located on the lateral aspect of the renal pelvis and between the upper pole and mid pole moiety which was somewhat spherical with irregular surface somewhat concerning for a papillary TCC although stent irritation with edema was also in the differential dx. There were no other lesions within the collecting system that were concerning.  At this point in time, retrograde pyelogram was performed through the ureteroscope and for unclear reasons, there is fairly brisk contrast extravasation at this point in time. The etiology of this was unclear given the absence of trauma with scope placement or wire placement. The collecting system was then decompressed by aspirating the irrigant to help minimize urinary extravasation. The UPJ was evaluated to ensure that there was no disruption or any other trauma in this area. In addition to this,  the upper tract was inspected and there is no clear etiology for the extrav appreciated.    At this point in time, I attempted  to use cold cup piranha forceps to biopsy the lesion in question however these are malfunctioning. There were no disposable back up forceps available. Cytology was then obtained by aspirating through the scope and passed off the field as right upper tract urine cytology. I then attempted to use a Gemini basket to able also the lesion in question however this is somewhat technically difficult. I did obtain of small micro-biopsy but no large specimen. The scope was then readvanced up to level of the kidney using a dual lumen access sheath to reintroduce a Super Stiff wire followed by the scope. A second retrograde pyelogram was performed at this point in time to reassess the external. This time, there is absolutely no extravasation and the upper tract collecting system filled quite nicely. At this point in time, the scope was backed down the length of the ureter inspecting along the way. There was no ureteral trauma or lesions identified along with no stones or stone fragments.   the safety wire was then backloaded over a rigid cystoscope and a 6 x 26 French double-J ureteral stent was advanced up to level of the kidney. The wire was partially drawn until full coil was noted within the renal pelvis. The wire was then fully withdrawn and a full coil was noted within the bladder. The bladder was then drained. The scope was then removed. Patient was then cleaned and dried, repositioned supine position, reversed from anesthesia, taken the PACU in stable condition.  Plan: Patient will follow-up in 2 weeks for pathology review. Pending the pathology results, we will consider removing the stent with consideration of returning to the OR for a second look in a few months immediate return to the OR.   Hollice Espy, M.D.

## 2016-07-31 ENCOUNTER — Encounter: Payer: Self-pay | Admitting: Urology

## 2016-07-31 LAB — SURGICAL PATHOLOGY

## 2016-07-31 LAB — CYTOLOGY - NON PAP

## 2016-08-05 ENCOUNTER — Emergency Department
Admission: EM | Admit: 2016-08-05 | Discharge: 2016-08-05 | Disposition: A | Discharge status: Home / Self Care | Payer: Non-veteran care | Attending: Emergency Medicine | Admitting: Emergency Medicine

## 2016-08-05 ENCOUNTER — Encounter: Payer: Self-pay | Admitting: Emergency Medicine

## 2016-08-05 ENCOUNTER — Emergency Department: Payer: Non-veteran care

## 2016-08-05 DIAGNOSIS — J449 Chronic obstructive pulmonary disease, unspecified: Secondary | ICD-10-CM | POA: Diagnosis not present

## 2016-08-05 DIAGNOSIS — K5909 Other constipation: Secondary | ICD-10-CM | POA: Insufficient documentation

## 2016-08-05 DIAGNOSIS — Z8546 Personal history of malignant neoplasm of prostate: Secondary | ICD-10-CM | POA: Insufficient documentation

## 2016-08-05 DIAGNOSIS — N189 Chronic kidney disease, unspecified: Secondary | ICD-10-CM | POA: Insufficient documentation

## 2016-08-05 DIAGNOSIS — Z87891 Personal history of nicotine dependence: Secondary | ICD-10-CM | POA: Insufficient documentation

## 2016-08-05 DIAGNOSIS — Z79899 Other long term (current) drug therapy: Secondary | ICD-10-CM | POA: Insufficient documentation

## 2016-08-05 DIAGNOSIS — K5903 Drug induced constipation: Secondary | ICD-10-CM

## 2016-08-05 DIAGNOSIS — I129 Hypertensive chronic kidney disease with stage 1 through stage 4 chronic kidney disease, or unspecified chronic kidney disease: Secondary | ICD-10-CM | POA: Diagnosis not present

## 2016-08-05 DIAGNOSIS — K5900 Constipation, unspecified: Secondary | ICD-10-CM | POA: Diagnosis present

## 2016-08-05 DIAGNOSIS — T402X5A Adverse effect of other opioids, initial encounter: Secondary | ICD-10-CM

## 2016-08-05 MED ORDER — ONDANSETRON 4 MG PO TBDP
4.0000 mg | ORAL_TABLET | Freq: Once | ORAL | Status: AC
  Administered 2016-08-05: 4 mg via ORAL

## 2016-08-05 MED ORDER — ONDANSETRON 4 MG PO TBDP
ORAL_TABLET | ORAL | Status: AC
  Administered 2016-08-05: 4 mg via ORAL
  Filled 2016-08-05: qty 1

## 2016-08-05 MED ORDER — PEG 3350-KCL-NABCB-NACL-NASULF 236 G PO SOLR: 4.0000 L | Freq: Once | ORAL | 0 refills | Status: AC

## 2016-08-05 MED ORDER — ONDANSETRON HCL 4 MG PO TABS: 4.0000 mg | ORAL_TABLET | Freq: Every day | ORAL | 0 refills | Status: AC | PRN

## 2016-08-05 NOTE — ED Triage Notes (Signed)
Pt to ed with c/o constipation x 4 days.  Pt states up until yesterday he was taking oxycodone but he ran out.  Pt states he has tried stool softeners and colace without relief.

## 2016-08-05 NOTE — ED Provider Notes (Signed)
[facility] Emergency Department Provider Note  ____________________________________________  Time seen: Approximately 1:43 PM  I have reviewed the triage vital signs and the nursing notes.   HISTORY  Chief Complaint Constipation    HPI Edwin Symes. is a 70 y.o. male presents for evaluation of constipation 5 days. Patient was recently treated with oxycodone for kidney stones and has not had a bowel movement in 5 days. Has tried stool softeners and laxatives without relief.   Past Medical History:  Diagnosis Date  . Anxiety    ptsd  . Cancer ([place])    PROSTATE  . Chronic kidney disease    stones  . COPD (chronic obstructive pulmonary disease) ([place])   . Hypertension   . Pneumonia    06/27/16  . Shortness of breath dyspnea     Patient Active Problem List   Diagnosis Date Noted  . Sepsis ([place]) 06/28/2016  . Right ureteral stone     Past Surgical History:  Procedure Laterality Date  . CYSTOSCOPY W/ URETERAL STENT PLACEMENT Right 07/29/2016   Procedure: CYSTOSCOPY WITH STENT REPLACEMENT;  Surgeon: Hollice Espy, MD;  Location: [facility] ORS;  Service: Urology;  Laterality: Right;  . CYSTOSCOPY WITH BIOPSY Right 07/29/2016   Procedure: CYSTOSCOPY WITH BIOPSY;  Surgeon: Hollice Espy, MD;  Location: [facility] ORS;  Service: Urology;  Laterality: Right;  . CYSTOSCOPY WITH STENT PLACEMENT Right 06/28/2016   Procedure: CYSTOSCOPY WITH STENT PLACEMENT;  Surgeon: Hollice Espy, MD;  Location: [facility] ORS;  Service: Urology;  Laterality: Right;  . PROSTATECTOMY    . URETEROSCOPY Right 07/29/2016   Procedure: URETEROSCOPY;  Surgeon: Hollice Espy, MD;  Location: [facility] ORS;  Service: Urology;  Laterality: Right;    Prior to Admission medications   Medication Sig Start Date End Date Taking? Authorizing Provider  albuterol (PROVENTIL HFA;VENTOLIN HFA) 108 (90 Base) MCG/ACT inhaler Inhale 1-2 puffs into the lungs every 6 (six) hours as needed for wheezing or  shortness of breath.    Historical Provider, MD  amLODipine (NORVASC) 10 MG tablet Take 10 mg by mouth daily.    Historical Provider, MD  budesonide-formoterol (SYMBICORT) 160-4.5 MCG/ACT inhaler Inhale 2 puffs into the lungs 2 (two) times daily.    Historical Provider, MD  docusate sodium (COLACE) 100 MG capsule Take 1 capsule (100 mg total) by mouth 2 (two) times daily. 07/04/16   Dustin Flock, MD  FLUoxetine (PROZAC) 20 MG capsule Take 40 mg by mouth daily.    Historical Provider, MD  oxyCODONE (OXY IR/ROXICODONE) 5 MG immediate release tablet Take 1 tablet (5 mg total) by mouth every 4 (four) hours as needed for moderate pain. 07/29/16   Hollice Espy, MD  polyethylene glycol (GOLYTELY) 236 g solution Take 4,000 mLs by mouth once. 08/05/16 08/05/16  Pierce Crane Aerielle Stoklosa, PA-C  tamsulosin (FLOMAX) 0.4 MG CAPS capsule Take 1 capsule (0.4 mg total) by mouth daily. 07/29/16   Hollice Espy, MD  traZODone (DESYREL) 100 MG tablet Take 100 mg by mouth at bedtime as needed for sleep.    Historical Provider, MD    Allergies Review of patient's allergies indicates no known allergies.  Family History  Problem Relation Age of Onset  . Stroke Mother   . Heart attack Father     Social History Social History  Substance Use Topics  . Smoking status: Former Smoker    Quit date: 06/27/2016  . Smokeless tobacco: Never Used  . Alcohol use No    Review of Systems Constitutional: No fever/chills  Eyes: No visual changes. ENT: No sore throat. Cardiovascular: Denies chest pain. Respiratory: Denies shortness of breath. Gastrointestinal: Positive abdominal pain.  Positive constipation. Genitourinary: Negative for dysuria. Musculoskeletal: Negative for back pain. Skin: Negative for rash. Neurological: Negative for headaches, focal weakness or numbness.  10-point ROS otherwise negative.  ____________________________________________   PHYSICAL EXAM:  VITAL SIGNS: ED Triage Vitals   Enc Vitals Group      BP (!) 153/83     Pulse Rate 92     Resp 18     Temp 97 F (36.1 C)     Temp Source Oral     SpO2 100 %     Weight 260 lb (117.9 kg)     Height '5\' 11"'$  (1.803 m)     Head Circumference      Peak Flow      Pain Score 9     Pain Loc      Pain Edu?      Excl. in [place]?     Constitutional: Alert and oriented. Well appearing and in no acute distress.  Cardiovascular: Normal rate, regular rhythm. Grossly normal heart sounds.  Good peripheral circulation. Respiratory: Normal respiratory effort.  No retractions. Lungs CTAB. Gastrointestinal: Morbidly obese with soft and firm noted throughout decreased bowel sounds noted. Musculoskeletal: No lower extremity tenderness nor edema.  No joint effusions. Neurologic:  Normal speech and language. No gross focal neurologic deficits are appreciated. No gait instability. Skin:  Skin is warm, dry and intact. No rash noted. Psychiatric: Mood and affect are normal. Speech and behavior are normal.  ____________________________________________   LABS (all labs ordered are listed, but only abnormal results are displayed)  Labs Reviewed - No data to display ____________________________________________  EKG   ____________________________________________  RADIOLOGY  IMPRESSION:  Changes of mild constipation. No obstructive changes seen.    ____________________________________________   PROCEDURES  Procedure(s) performed: None  Critical Care performed: No  ____________________________________________   INITIAL IMPRESSION / ASSESSMENT AND PLAN / ED COURSE  Pertinent labs & imaging results that were available during my care of the patient were reviewed by me and considered in my medical decision making (see chart for details). Review of the [place] CSRS was performed in accordance of the [place] prior to dispensing any controlled drugs.  Constipation. Soapsuds enema requested.  Clinical Course  Patient was given a soapsuds enema with no  results. Patient unable to hold the enema in. We'll discharge home with plans for GoLYTELY times one time use. Return to the ER if any worsening symptomology.  ____________________________________________   FINAL CLINICAL IMPRESSION(S) / ED DIAGNOSES  Final diagnoses:  Constipation due to opioid therapy     This chart was dictated using voice recognition software/Dragon. Despite best efforts to proofread, errors can occur which can change the meaning. Any change was purely unintentional.    Arlyss Repress, PA-C 08/05/16 [place], MD 08/06/16 [phone]

## 2016-08-09 ENCOUNTER — Encounter: Payer: Self-pay | Admitting: Urology

## 2016-08-09 ENCOUNTER — Ambulatory Visit (INDEPENDENT_AMBULATORY_CARE_PROVIDER_SITE_OTHER): Payer: Non-veteran care | Admitting: Urology

## 2016-08-09 ENCOUNTER — Other Ambulatory Visit: Payer: Medicare Other

## 2016-08-09 VITALS — BP 147/76 | HR 51 | Ht 71.0 in | Wt 271.6 lb

## 2016-08-09 DIAGNOSIS — N201 Calculus of ureter: Secondary | ICD-10-CM

## 2016-08-09 DIAGNOSIS — N2889 Other specified disorders of kidney and ureter: Secondary | ICD-10-CM

## 2016-08-09 LAB — MICROSCOPIC EXAMINATION: Bacteria, UA: NONE SEEN

## 2016-08-09 LAB — URINALYSIS, COMPLETE
Bilirubin, UA: NEGATIVE
GLUCOSE, UA: NEGATIVE
KETONES UA: NEGATIVE
NITRITE UA: NEGATIVE
Specific Gravity, UA: 1.02 (ref 1.005–1.030)
UUROB: 0.2 mg/dL (ref 0.2–1.0)
pH, UA: 5.5 (ref 5.0–7.5)

## 2016-08-09 MED ORDER — LIDOCAINE HCL 2 % EX GEL
1.0000 "application " | Freq: Once | CUTANEOUS | Status: AC
  Administered 2016-08-09: 1 via URETHRAL

## 2016-08-09 MED ORDER — CIPROFLOXACIN HCL 500 MG PO TABS
500.0000 mg | ORAL_TABLET | Freq: Once | ORAL | Status: AC
  Administered 2016-08-09: 500 mg via ORAL

## 2016-08-10 NOTE — Progress Notes (Signed)
08/09/2016 3:32 PM   Edwin Donovan. 1946-06-09 [id number]  Referring provider: [facility] [address]  [place], [place] [address]  Chief Complaint  Patient presents with  . Cysto Stent Removal    HPI:   70 year old male status post right ureteroscopy on 07/29/2016 after recent admission with urosepsis and 6 mm UVJ stone necessitating emergent right ureteral stent placement in the setting of acute kidney injury, respiratory failure, and leukocytosis. He improved clinically and was discharged.   He returned to the operating room on 07/29/2016 for definitive management of the stone. At that time, ureteroscopy showed no evidence of residual stone which she presumably passed with the stent.  Upper tract ureteroscopy at that time showed a lesion within the upper renal pelvis which was somewhat concerning although may have been reactive. This was biopsied as long with urine cytology. Otology was consistent with benign, reactive urothelial cells. Biopsy showed hyperplastic urothelium.  Returns to the office for cystoscopy, stent removal. He's had no postoperative complications other than constipation.  He does have an extensive smoking history.   PMH: Past Medical History:  Diagnosis Date  . Anxiety    ptsd  . Cancer ([place])    PROSTATE  . Chronic kidney disease    stones  . COPD (chronic obstructive pulmonary disease) ([place])   . Hypertension   . Pneumonia    06/27/16  . Shortness of breath dyspnea     Surgical History: Past Surgical History:  Procedure Laterality Date  . CYSTOSCOPY W/ URETERAL STENT PLACEMENT Right 07/29/2016   Procedure: CYSTOSCOPY WITH STENT REPLACEMENT;  Surgeon: Hollice Espy, MD;  Location: [facility] ORS;  Service: Urology;  Laterality: Right;  . CYSTOSCOPY WITH BIOPSY Right 07/29/2016   Procedure: CYSTOSCOPY WITH BIOPSY;  Surgeon: Hollice Espy, MD;  Location: [facility] OR[facility];  Service: Urology;  Laterality: Right;  . CYSTOSCOPY WITH STENT  PLACEMENT Right 06/28/2016   Procedure: CYSTOSCOPY WITH STENT PLACEMENT;  Surgeon: Hollice Espy, MD;  Location: [facility] OR[facility];  Service: Urology;  Laterality: Right;  . PROSTATECTOMY    . URETEROSCOPY Right 07/29/2016   Procedure: URETEROSCOPY;  Surgeon: Hollice Espy, MD;  Location: [facility] ORS;  Service: Urology;  Laterality: Right;    Home Medications:    Medication List       Accurate as of 08/09/16 11:59 PM. Always use your most recent med list.          albuterol 108 (90 Base) MCG/ACT inhaler Commonly known as:  PROVENTIL HFA;VENTOLIN HFA Inhale 1-2 puffs into the lungs every 6 (six) hours as needed for wheezing or shortness of breath.   amLODipine 10 MG tablet Commonly known as:  NORVASC Take 10 mg by mouth daily.   budesonide-formoterol 160-4.5 MCG/ACT inhaler Commonly known as:  SYMBICORT Inhale 2 puffs into the lungs 2 (two) times daily.   docusate sodium 100 MG capsule Commonly known as:  COLACE Take 1 capsule (100 mg total) by mouth 2 (two) times daily.   FLUoxetine 20 MG capsule Commonly known as:  PROZAC Take 40 mg by mouth daily.   ondansetron 4 MG tablet Commonly known as:  ZOFRAN Take 1 tablet (4 mg total) by mouth daily as needed for nausea or vomiting.   oxyCODONE 5 MG immediate release tablet Commonly known as:  Oxy IR/ROXICODONE Take 1 tablet (5 mg total) by mouth every 4 (four) hours as needed for moderate pain.   tamsulosin 0.4 MG Caps capsule Commonly known as:  FLOMAX Take 1 capsule (0.4  mg total) by mouth daily.   traZODone 100 MG tablet Commonly known as:  DESYREL Take 100 mg by mouth at bedtime as needed for sleep.       Allergies: No Known Allergies  Family History: Family History  Problem Relation Age of Onset  . Stroke Mother   . Heart attack Father     Social History:  reports that he quit smoking about 6 weeks ago. He has never used smokeless tobacco. He reports that he does not drink alcohol or use drugs.   Physical  Exam: BP (!) 147/76 (BP Location: Left Arm, Patient Position: Sitting, Cuff Size: Large)   Pulse (!) 51   Ht '5\' 11"'$  (1.803 m)   Wt 271 lb 9.6 oz (123.2 kg)   BMI 37.88 kg/m   Constitutional:  Alert and oriented, No acute distress. HEENT: [place] AT, moist mucus membranes.  Trachea midline, no masses. Cardiovascular: No clubbing, cyanosis, or edema. Respiratory: Normal respiratory effort, no increased work of breathing. GI: Abdomen is soft, nontender, nondistended, no abdominal masses GU: No CVA tenderness. Normal circumcised phallus with orthotopic meatus. Skin: No rashes, bruises or suspicious lesions. Neurologic: Grossly intact, no focal deficits, moving all 4 extremities. Psychiatric: Normal mood and affect.  Laboratory Data: Lab Results  Component Value Date   WBC 12.7 (H) 07/02/2016   HGB 14.2 07/02/2016   HCT 41.9 07/02/2016   MCV 92.8 07/02/2016   PLT 95 (L) 07/02/2016    Lab Results  Component Value Date   CREATININE 0.99 07/02/2016     Lab Results  Component Value Date   HGBA1C 6.0 06/28/2016    Urinalysis Results for orders placed or performed in visit on 08/09/16  Microscopic Examination  Result Value Ref Range   WBC, UA 11-30 (A) 0 - 5 /hpf   RBC, UA 11-30 (A) 0 - 2 /hpf   Epithelial Cells (non renal) 0-10 0 - 10 /hpf   Casts Present (A) None seen /lpf   Cast Type Hyaline casts N/A   Mucus, UA Present (A) Not Estab.   Bacteria, UA None seen None seen/Few  Urinalysis, Complete  Result Value Ref Range   Specific Gravity, UA 1.020 1.005 - 1.030   pH, UA 5.5 5.0 - 7.5   Color, UA Yellow Yellow   Appearance Ur Cloudy (A) Clear   Leukocytes, UA 1+ (A) Negative   Protein, UA 2+ (A) Negative/Trace   Glucose, UA Negative Negative   Ketones, UA Negative Negative   RBC, UA 2+ (A) Negative   Bilirubin, UA Negative Negative   Urobilinogen, Ur 0.2 0.2 - 1.0 mg/dL   Nitrite, UA Negative Negative   Microscopic Examination See below:     Cystoscopy/ Stent  removal procedure  Patient identification was confirmed, informed consent was obtained, and patient was prepped using Betadine solution.  Lidocaine jelly was administered per urethral meatus.    Preoperative abx where received prior to procedure.    Procedure: - Flexible cystoscope introduced, without any difficulty.   - Thorough search of the bladder revealed:    normal urethral meatus  Stent seen emanating from right ureteral orifice, grasped with stent graspers, and removed in entirety.    Post-Procedure: - Patient tolerated the procedure well  Assessment & Plan:    1. Right ureteral stone S/p R URS Stent removed today Warning symptoms reivewed - Urinalysis, Complete - lidocaine (XYLOCAINE) 2 % jelly 1 application; Place 1 application into the urethra once. - ciprofloxacin (CIPRO) tablet 500 mg; Take 1  tablet (500 mg total) by mouth once.  2. Right renal mass Incidental right renal pelvic lesion at the time of ureteroscopy, likely reactive from stent, biopsy negative for dysplasia with normal upper tract cytology Typically follow up with renal ultrasound in 4-6 weeks post ureteroscopy to rule out iatrogenic hydronephrosis Given this history of questionable upper tract lesion, will follow-up with CT urogram. Patient is agreeable with this plan. - CT HEMATURIA WORKUP; Future   Return in about 8 weeks (around 10/04/2016) for f/u CT urogram.  Hollice Espy, MD  [facility] [address], [place], [place] [address] [phone]

## 2016-08-16 ENCOUNTER — Other Ambulatory Visit: Payer: Medicare Other | Admitting: Urology

## 2016-09-11 ENCOUNTER — Ambulatory Visit: Admission: RE | Admit: 2016-09-11 | Payer: Non-veteran care | Source: Ambulatory Visit

## 2016-10-04 ENCOUNTER — Encounter: Payer: Self-pay | Admitting: Urology

## 2016-10-04 ENCOUNTER — Ambulatory Visit: Payer: Medicare Other | Admitting: Urology

## 2016-10-21 ENCOUNTER — Ambulatory Visit (INDEPENDENT_AMBULATORY_CARE_PROVIDER_SITE_OTHER): Payer: Non-veteran care | Admitting: Urology

## 2016-10-21 ENCOUNTER — Encounter: Payer: Self-pay | Admitting: Urology

## 2016-10-21 VITALS — BP 143/91 | HR 96 | Ht 71.0 in | Wt 270.4 lb

## 2016-10-21 DIAGNOSIS — R109 Unspecified abdominal pain: Secondary | ICD-10-CM | POA: Diagnosis not present

## 2016-10-21 DIAGNOSIS — R3129 Other microscopic hematuria: Secondary | ICD-10-CM

## 2016-10-21 NOTE — Progress Notes (Signed)
10/21/2016 4:30 PM   Edwin Donovan 1945-12-20 [id number]  Referring provider: [facility]  [place], [place] [address]  Chief Complaint  Patient presents with  . Flank Pain    Dysuria     HPI: Patient is a 70 year old Caucasian male who presents today complaining of bilateral flank pain with dysuria.   Background history Patient is status post right ureteroscopy on 07/29/2016 after recent admission with urosepsis and 6 mm UVJ stone necessitating emergent right ureteral stent placement in the setting of acute kidney injury, respiratory failure, and leukocytosis. He improved clinically and was discharged.  He returned to the operating room on 07/29/2016 for definitive management of the stone. At that time, ureteroscopy showed no evidence of residual stone which she presumably passed with the stent.  Upper tract ureteroscopy at that time showed a lesion within the upper renal pelvis which was somewhat concerning although may have been reactive. This was biopsied as long with urine cytology. Otology was consistent with benign, reactive urothelial cells. Biopsy showed hyperplastic urothelium.  Returned to the office for cystoscopy, stent removal. He's had no postoperative complications other than constipation.  He does have an extensive smoking history.  He was to undergo a CT Urogram in 09/2016 for a right renal mass, but he did not have the imaging study performed.    Patient states that he was having bilateral flank pain for two weeks, but now it has abated.  He is not having any more dysuria as well.  He describes the dysuria more as a hesitancy.  He not had gross hematuria, suprapubic pain or dysuria at this time.  He denies fevers, chills, nausea or vomiting.  He did not have his CT done due to his wife's issues.  His UA today is unremarkable.     PMH: Past Medical History:  Diagnosis Date  . Anxiety    ptsd  . Cancer ([place])    PROSTATE  . Chronic  kidney disease    stones  . COPD (chronic obstructive pulmonary disease) ([place])   . Hypertension   . Pneumonia    06/27/16  . Shortness of breath dyspnea     Surgical History: Past Surgical History:  Procedure Laterality Date  . CYSTOSCOPY W/ URETERAL STENT PLACEMENT Right 07/29/2016   Procedure: CYSTOSCOPY WITH STENT REPLACEMENT;  Surgeon: Hollice Espy, MD;  Location: [facility] OR[facility];  Service: Urology;  Laterality: Right;  . CYSTOSCOPY WITH BIOPSY Right 07/29/2016   Procedure: CYSTOSCOPY WITH BIOPSY;  Surgeon: Hollice Espy, MD;  Location: [facility];  Service: Urology;  Laterality: Right;  . CYSTOSCOPY WITH STENT PLACEMENT Right 06/28/2016   Procedure: CYSTOSCOPY WITH STENT PLACEMENT;  Surgeon: Hollice Espy, MD;  Location: [facility];  Service: Urology;  Laterality: Right;  . PROSTATECTOMY    . URETEROSCOPY Right 07/29/2016   Procedure: URETEROSCOPY;  Surgeon: Hollice Espy, MD;  Location: [facility];  Service: Urology;  Laterality: Right;    Home Medications:    Medication List       Accurate as of 10/21/16  4:30 PM. Always use your most recent med list.          albuterol 108 (90 Base) MCG/ACT inhaler Commonly known as:  PROVENTIL HFA;VENTOLIN HFA Inhale 1-2 puffs into the lungs every 6 (six) hours as needed for wheezing or shortness of breath.   amLODipine 10 MG tablet Commonly known as:  NORVASC Take 10 mg by mouth daily.   budesonide-formoterol 160-4.5 MCG/ACT inhaler Commonly known  as:  SYMBICORT Inhale 2 puffs into the lungs 2 (two) times daily.   docusate sodium 100 MG capsule Commonly known as:  COLACE Take 1 capsule (100 mg total) by mouth 2 (two) times daily.   FLUoxetine 20 MG capsule Commonly known as:  PROZAC Take 40 mg by mouth daily.   ondansetron 4 MG tablet Commonly known as:  ZOFRAN Take 1 tablet (4 mg total) by mouth daily as needed for nausea or vomiting.   oxyCODONE 5 MG immediate release tablet Commonly known as:  Oxy IR/ROXICODONE Take 1 tablet  (5 mg total) by mouth every 4 (four) hours as needed for moderate pain.   tamsulosin 0.4 MG Caps capsule Commonly known as:  FLOMAX Take 1 capsule (0.4 mg total) by mouth daily.   traZODone 100 MG tablet Commonly known as:  DESYREL Take 100 mg by mouth at bedtime as needed for sleep.       Allergies: No Known Allergies  Family History: Family History  Problem Relation Age of Onset  . Stroke Mother   . Heart attack Father   . Lung cancer Father   . Kidney disease Father   . Prostate cancer Neg Hx   . Bladder Cancer Neg Hx     Social History:  reports that he quit smoking about 3 months ago. He has never used smokeless tobacco. He reports that he does not drink alcohol or use drugs.   Physical Exam: BP (!) 143/91   Pulse 96   Ht '5\' 11"'$  (1.803 m)   Wt 270 lb 6.4 oz (122.7 kg)   BMI 37.71 kg/m   Constitutional:  Alert and oriented, No acute distress. HEENT: [place] AT, moist mucus membranes.  Trachea midline, no masses. Cardiovascular: No clubbing, cyanosis, or edema. Respiratory: Normal respiratory effort, no increased work of breathing. GI: Abdomen is soft, non tender, non distended, no abdominal masses GU: No CVA tenderness. Normal circumcised phallus with orthotopic meatus. Skin: No rashes, bruises or suspicious lesions. Neurologic: Grossly intact, no focal deficits, moving all 4 extremities. Psychiatric: Normal mood and affect.  Laboratory Data: Lab Results  Component Value Date   WBC 12.7 (H) 07/02/2016   HGB 14.2 07/02/2016   HCT 41.9 07/02/2016   MCV 92.8 07/02/2016   PLT 95 (L) 07/02/2016    Lab Results  Component Value Date   CREATININE 0.99 07/02/2016     Lab Results  Component Value Date   HGBA1C 6.0 06/28/2016    Urinalysis Unremarkable.  See EPIC.    Assessment & Plan:    1. Right ureteral stone  - still needs follow up imaging  - reschedule CT Urogram  2. Right renal mass  - Incidental right renal pelvic lesion at the time of  ureteroscopy, likely reactive from stent, biopsy negative for dysplasia with normal upper tract cytology  - Typically follow up with renal ultrasound in 4-6 weeks post ureteroscopy to rule out iatrogenic hydronephrosis  - Given this history of questionable upper tract lesion, will follow-up with CT urogram. Patient is agreeable with this plan.  - CT HEMATURIA WORKUP; Future  3. Bilateral flank pain  - resolved, most likely due to arthritis   Return for CT report with Dr. Erlene Quan.  Zara Council, [facility] [address] [place], [place] [address] [phone]

## 2016-10-22 LAB — BUN+CREAT
BUN / CREAT RATIO: 20 (ref 10–24)
BUN: 18 mg/dL (ref 8–27)
Creatinine, Ser: 0.88 mg/dL (ref 0.76–1.27)
GFR calc non Af Amer: 86 mL/min/{1.73_m2} (ref >59)
GFR, EST AFRICAN AMERICAN: 99 mL/min/{1.73_m2} (ref >59)

## 2016-10-22 LAB — MICROSCOPIC EXAMINATION: Bacteria, UA: NONE SEEN

## 2016-10-22 LAB — URINALYSIS, COMPLETE
Bilirubin, UA: NEGATIVE
Glucose, UA: NEGATIVE
Ketones, UA: NEGATIVE
Leukocytes, UA: NEGATIVE
Nitrite, UA: NEGATIVE
PH UA: 6 (ref 5.0–7.5)
RBC, UA: NEGATIVE
Specific Gravity, UA: 1.02 (ref 1.005–1.030)
UUROB: 0.2 mg/dL (ref 0.2–1.0)

## 2016-10-24 LAB — CULTURE, URINE COMPREHENSIVE

## 2016-10-30 ENCOUNTER — Ambulatory Visit: Payer: Non-veteran care

## 2016-11-05 ENCOUNTER — Ambulatory Visit: Payer: Medicare Other | Admitting: Urology

## 2016-12-02 ENCOUNTER — Ambulatory Visit: Admission: RE | Admit: 2016-12-02 | Payer: Non-veteran care | Source: Ambulatory Visit

## 2017-10-23 ENCOUNTER — Ambulatory Visit: Payer: Non-veteran care | Admitting: Family Medicine

## 2017-10-28 ENCOUNTER — Ambulatory Visit: Payer: Non-veteran care | Admitting: Family Medicine

## 2017-10-31 ENCOUNTER — Ambulatory Visit (INDEPENDENT_AMBULATORY_CARE_PROVIDER_SITE_OTHER): Payer: Medicare HMO | Admitting: Family Medicine

## 2017-10-31 ENCOUNTER — Encounter: Payer: Self-pay | Admitting: Family Medicine

## 2017-10-31 VITALS — BP 155/82 | HR 75 | Temp 97.9°F | Ht 71.0 in | Wt 255.6 lb

## 2017-10-31 DIAGNOSIS — I10 Essential (primary) hypertension: Secondary | ICD-10-CM | POA: Diagnosis not present

## 2017-10-31 DIAGNOSIS — F419 Anxiety disorder, unspecified: Secondary | ICD-10-CM

## 2017-10-31 DIAGNOSIS — G8929 Other chronic pain: Secondary | ICD-10-CM

## 2017-10-31 DIAGNOSIS — M545 Low back pain: Secondary | ICD-10-CM | POA: Diagnosis not present

## 2017-10-31 DIAGNOSIS — J449 Chronic obstructive pulmonary disease, unspecified: Secondary | ICD-10-CM | POA: Diagnosis not present

## 2017-10-31 DIAGNOSIS — Z7689 Persons encountering health services in other specified circumstances: Secondary | ICD-10-CM

## 2017-10-31 DIAGNOSIS — G4700 Insomnia, unspecified: Secondary | ICD-10-CM

## 2017-10-31 MED ORDER — LISINOPRIL 10 MG PO TABS: 10.0000 mg | ORAL_TABLET | Freq: Every day | ORAL | 0 refills | Status: DC

## 2017-10-31 MED ORDER — LIDOCAINE 5 % EX PTCH: 2.0000 | MEDICATED_PATCH | CUTANEOUS | 6 refills | Status: DC

## 2017-10-31 NOTE — Progress Notes (Signed)
BP (!) 155/82 (BP Location: Right Arm, Patient Position: Sitting, Cuff Size: Normal)   Pulse 75   Temp 97.9 F (36.6 C) (Oral)   Ht 5\' 11"  (1.803 m)   Wt 255 lb 9.6 oz (115.9 kg)   SpO2 98%   BMI 35.65 kg/m    Subjective:    Patient ID: Edwin Oh., male    DOB: [birth year], 71 y.o.   MRN: [id number]  HPI: Edwin Donovan is a 71 y.o. male  Chief Complaint  Patient presents with  . Establish Care  . Arthritis   Patient [facility] to [facility].   Has been getting workup for palpitations with the [place], recently got echocardiogram and will be getting more testing. Has not heard back on results yet.   Has severe arthritis in b/l knees, getting cortisone shots. Also having neck and back issues now. Uses voltaren gel occasionally and tylenol prn, wanting injections like he's had in his knees. Has been on opioids for his pain in the past and does not like the way they make him feel. Wanting to avoid pain medications.   HTN - tends to run high, 170s/90s on 10 mg amlodipine. Has never tried anything additionally.   "part time smoker" - only several cigarettes daily at this time. Wanting to quit in the near future, has done well with cold [place] method in the past and will try that again. Uses albuterol and symbicort for COPD control with good relief.   Takes trazodone about once to twice a week for sleep with good relief, only taking 50 mg at a time because 100 mg dose was too much for him. Takes prozac for anxiety when he remembers it.   Past Medical History:  Diagnosis Date  . Anxiety    ptsd  . Cancer ([place])    PROSTATE  . Chronic kidney disease    stones  . COPD (chronic obstructive pulmonary disease) ([place])   . Hypertension   . Pneumonia    06/27/16  . Shortness of breath dyspnea    Social History   Socioeconomic History  . Marital status: Married    Spouse name: Not on file  . Number of children: Not on  file  . Years of education: Not on file  . Highest education level: Not on file  Social Needs  . Financial resource strain: Not on file  . Food insecurity - worry: Not on file  . Food insecurity - inability: Not on file  . Transportation needs - medical: Not on file  . Transportation needs - non-medical: Not on file  Occupational History  . Not on file  Tobacco Use  . Smoking status: Former Smoker    Last attempt to quit: 06/27/2016    Years since quitting: 1.3  . Smokeless tobacco: Never Used  Substance and Sexual Activity  . Alcohol use: No  . Drug use: No    Comment: about 1 month ago  . Sexual activity: Not on file  Other Topics Concern  . Not on file  Social History Narrative  . Not on file   Relevant past medical, surgical, family and social history reviewed and updated as indicated. Interim medical history since our last visit reviewed. Allergies and medications reviewed and updated.  Review of Systems  Constitutional: Negative.   HENT: Negative.   Respiratory: Negative.   Cardiovascular: Negative.   Gastrointestinal: Negative.   Genitourinary: Negative.  Musculoskeletal: Positive for arthralgias, back pain and neck pain.  Skin: Negative.   Neurological: Negative.   Psychiatric/Behavioral: Negative.     Per HPI unless specifically indicated above     Objective:    BP (!) 155/82 (BP Location: Right Arm, Patient Position: Sitting, Cuff Size: Normal)   Pulse 75   Temp 97.9 F (36.6 C) (Oral)   Ht 5\' 11"  (1.803 m)   Wt 255 lb 9.6 oz (115.9 kg)   SpO2 98%   BMI 35.65 kg/m   Wt Readings from Last 3 Encounters:  10/31/17 255 lb 9.6 oz (115.9 kg)  10/21/16 270 lb 6.4 oz (122.7 kg)  08/09/16 271 lb 9.6 oz (123.2 kg)    Physical Exam  Constitutional: He is oriented to person, place, and time. He appears well-developed and well-nourished. No distress.  Eyes: Conjunctivae are normal. No scleral icterus.  Neck:  Antalgic movements, crepitus  Cardiovascular:  Normal rate and normal heart sounds.  Pulmonary/Chest: Effort normal. No respiratory distress. He has wheezes (minimal).  Musculoskeletal: He exhibits no edema or deformity.  Antalgic movements  Neurological: He is alert and oriented to person, place, and time.  Skin: Skin is warm and dry.  Psychiatric: He has a normal mood and affect. His behavior is normal.  Nursing note and vitals reviewed.  Results for orders placed or performed in visit on 10/21/16  CULTURE, URINE COMPREHENSIVE  Result Value Ref Range   Urine Culture, Comprehensive Final report    Organism ID, Bacteria Comment   Microscopic Examination  Result Value Ref Range   WBC, UA 0-5 0 - 5 /hpf   RBC, UA 0-2 0 - 2 /hpf   Epithelial Cells (non renal) 0-10 0 - 10 /hpf   Bacteria, UA None seen None seen/Few  Urinalysis, Complete  Result Value Ref Range   Specific Gravity, UA 1.020 1.005 - 1.030   pH, UA 6.0 5.0 - 7.5   Color, UA Yellow Yellow   Appearance Ur Clear Clear   Leukocytes, UA Negative Negative   Protein, UA 2+ (A) Negative/Trace   Glucose, UA Negative Negative   Ketones, UA Negative Negative   RBC, UA Negative Negative   Bilirubin, UA Negative Negative   Urobilinogen, Ur 0.2 0.2 - 1.0 mg/dL   Nitrite, UA Negative Negative   Microscopic Examination See below:   BUN+Creat  Result Value Ref Range   BUN 18 8 - 27 mg/dL   Creatinine, Ser 0.88 0.76 - 1.27 mg/dL   GFR calc non Af Amer 86 >59 mL/min/1.73   GFR calc Af Amer 99 >59 mL/min/1.73   BUN/Creatinine Ratio 20 10 - 24      Assessment & Plan:   Problem List Items Addressed This Visit      Cardiovascular and Mediastinum   Hypertension    Not at goal with amlodipine, will add 10 mg lisinopril and monitor closely for benefit. Will recheck in 1 month with BMP      Relevant Medications   lisinopril (PRINIVIL,ZESTRIL) 10 MG tablet     Respiratory   COPD (chronic obstructive pulmonary disease) ([facility])    Continue albuterol and symbicort regimen.  Encouraged continued efforts with smoking cessation. Offered some support there but pt very much wanting to do the cold [place] method. Will be available as needed in this.         Other   Insomnia    Continue 50 mg trazodone nightly prn for sleep. Getting good benefit from this regimen  Anxiety    Continue prozac, discussed importance of compliance and daily use with this medication. Pt agreeable to start placing it next to his other medications to hlep with compliance.        Other Visit Diagnoses    Chronic bilateral low back pain, with sciatica presence unspecified    -  Primary   Referral made to pain clinic for eval for injections. Lidocaine patches and voltaren gel prn until then.    Relevant Orders   Ambulatory referral to Pain Clinic   Encounter to establish care           Follow up plan: Return in about 4 weeks (around 11/28/2017) for BP, bmp.

## 2017-11-03 DIAGNOSIS — J449 Chronic obstructive pulmonary disease, unspecified: Secondary | ICD-10-CM | POA: Insufficient documentation

## 2017-11-03 DIAGNOSIS — I10 Essential (primary) hypertension: Secondary | ICD-10-CM | POA: Insufficient documentation

## 2017-11-03 DIAGNOSIS — F419 Anxiety disorder, unspecified: Secondary | ICD-10-CM | POA: Insufficient documentation

## 2017-11-03 DIAGNOSIS — G4700 Insomnia, unspecified: Secondary | ICD-10-CM | POA: Insufficient documentation

## 2017-11-03 NOTE — Assessment & Plan Note (Signed)
Continue 50 mg trazodone nightly prn for sleep. Getting good benefit from this regimen

## 2017-11-03 NOTE — Assessment & Plan Note (Signed)
Continue prozac, discussed importance of compliance and daily use with this medication. Pt agreeable to start placing it next to his other medications to hlep with compliance.

## 2017-11-03 NOTE — Assessment & Plan Note (Signed)
Not at goal with amlodipine, will add 10 mg lisinopril and monitor closely for benefit. Will recheck in 1 month with BMP

## 2017-11-03 NOTE — Patient Instructions (Signed)
Follow up in 1 month   

## 2017-11-03 NOTE — Assessment & Plan Note (Signed)
Continue albuterol and symbicort regimen. Encouraged continued efforts with smoking cessation. Offered some support there but pt very much wanting to do the cold [place] method. Will be available as needed in this.

## 2017-11-23 ENCOUNTER — Emergency Department: Payer: Medicare Other

## 2017-11-23 ENCOUNTER — Emergency Department
Admission: EM | Admit: 2017-11-23 | Discharge: 2017-11-23 | Disposition: A | Discharge status: Home / Self Care | Payer: Medicare Other | Attending: Emergency Medicine | Admitting: Emergency Medicine

## 2017-11-23 ENCOUNTER — Other Ambulatory Visit: Payer: Self-pay

## 2017-11-23 ENCOUNTER — Encounter: Payer: Self-pay | Admitting: Emergency Medicine

## 2017-11-23 DIAGNOSIS — N189 Chronic kidney disease, unspecified: Secondary | ICD-10-CM | POA: Insufficient documentation

## 2017-11-23 DIAGNOSIS — I714 Abdominal aortic aneurysm, without rupture, unspecified: Secondary | ICD-10-CM

## 2017-11-23 DIAGNOSIS — N201 Calculus of ureter: Secondary | ICD-10-CM | POA: Diagnosis not present

## 2017-11-23 DIAGNOSIS — I129 Hypertensive chronic kidney disease with stage 1 through stage 4 chronic kidney disease, or unspecified chronic kidney disease: Secondary | ICD-10-CM | POA: Diagnosis not present

## 2017-11-23 DIAGNOSIS — N200 Calculus of kidney: Secondary | ICD-10-CM | POA: Insufficient documentation

## 2017-11-23 DIAGNOSIS — R1031 Right lower quadrant pain: Secondary | ICD-10-CM | POA: Diagnosis present

## 2017-11-23 DIAGNOSIS — F17210 Nicotine dependence, cigarettes, uncomplicated: Secondary | ICD-10-CM | POA: Insufficient documentation

## 2017-11-23 DIAGNOSIS — Z79899 Other long term (current) drug therapy: Secondary | ICD-10-CM | POA: Diagnosis not present

## 2017-11-23 DIAGNOSIS — J449 Chronic obstructive pulmonary disease, unspecified: Secondary | ICD-10-CM | POA: Diagnosis not present

## 2017-11-23 HISTORY — DX: Calculus of kidney: N20.0

## 2017-11-23 LAB — COMPREHENSIVE METABOLIC PANEL
ALBUMIN: 4.1 g/dL (ref 3.5–5.0)
ALT: 21 U/L (ref 17–63)
AST: 27 U/L (ref 15–41)
Alkaline Phosphatase: 77 U/L (ref 38–126)
Anion gap: 8 (ref 5–15)
BUN: 30 mg/dL — AB (ref 6–20)
CHLORIDE: 104 mmol/L (ref 101–111)
CO2: 24 mmol/L (ref 22–32)
CREATININE: 1.11 mg/dL (ref 0.61–1.24)
Calcium: 9.1 mg/dL (ref 8.9–10.3)
GFR calc Af Amer: >60 mL/min (ref >60)
GFR calc non Af Amer: >60 mL/min (ref >60)
GLUCOSE: 121 mg/dL — AB (ref 65–99)
POTASSIUM: 4.4 mmol/L (ref 3.5–5.1)
SODIUM: 136 mmol/L (ref 135–145)
Total Bilirubin: 0.9 mg/dL (ref 0.3–1.2)
Total Protein: 7.2 g/dL (ref 6.5–8.1)

## 2017-11-23 LAB — CBC WITH DIFFERENTIAL/PLATELET
Basophils Absolute: 0 10*3/uL (ref 0–0.1)
Basophils Relative: 1 %
EOS ABS: 0.1 10*3/uL (ref 0–0.7)
Eosinophils Relative: 2 %
HEMATOCRIT: 43.8 % (ref 40.0–52.0)
HEMOGLOBIN: 14.9 g/dL (ref 13.0–18.0)
LYMPHS ABS: 0.6 10*3/uL — AB (ref 1.0–3.6)
Lymphocytes Relative: 9 %
MCH: 33.8 pg (ref 26.0–34.0)
MCHC: 33.9 g/dL (ref 32.0–36.0)
MCV: 99.7 fL (ref 80.0–100.0)
MONOS PCT: 7 %
Monocytes Absolute: 0.4 10*3/uL (ref 0.2–1.0)
NEUTROS ABS: 5.3 10*3/uL (ref 1.4–6.5)
NEUTROS PCT: 81 %
Platelets: 156 10*3/uL (ref 150–440)
RBC: 4.39 MIL/uL — AB (ref 4.40–5.90)
RDW: 13.7 % (ref 11.5–14.5)
WBC: 6.5 10*3/uL (ref 3.8–10.6)

## 2017-11-23 LAB — URINALYSIS, ROUTINE W REFLEX MICROSCOPIC
Bilirubin Urine: NEGATIVE
Glucose, UA: NEGATIVE mg/dL
Hgb urine dipstick: NEGATIVE
Ketones, ur: NEGATIVE mg/dL
Leukocytes, UA: NEGATIVE
Nitrite: NEGATIVE
Protein, ur: NEGATIVE mg/dL
Specific Gravity, Urine: 1.011 (ref 1.005–1.030)
pH: 6 (ref 5.0–8.0)

## 2017-11-23 LAB — LACTIC ACID, PLASMA
Lactic Acid, Venous: 0.9 mmol/L (ref 0.5–1.9)
Lactic Acid, Venous: 1 mmol/L (ref 0.5–1.9)

## 2017-11-23 LAB — LIPASE, BLOOD: Lipase: 27 U/L (ref 11–51)

## 2017-11-23 LAB — TROPONIN I

## 2017-11-23 MED ORDER — TAMSULOSIN HCL 0.4 MG PO CAPS: 0.4000 mg | ORAL_CAPSULE | Freq: Every day | ORAL | 0 refills | Status: DC

## 2017-11-23 MED ORDER — MORPHINE SULFATE (PF) 4 MG/ML IV SOLN
4.0000 mg | Freq: Once | INTRAVENOUS | Status: AC
  Administered 2017-11-23: 4 mg via INTRAVENOUS
  Filled 2017-11-23: qty 1

## 2017-11-23 MED ORDER — ONDANSETRON HCL 4 MG/2ML IJ SOLN
4.0000 mg | Freq: Once | INTRAMUSCULAR | Status: AC
  Administered 2017-11-23: 4 mg via INTRAVENOUS
  Filled 2017-11-23: qty 2

## 2017-11-23 MED ORDER — SODIUM CHLORIDE 0.9 % IV BOLUS (SEPSIS)
1000.0000 mL | Freq: Once | INTRAVENOUS | Status: AC
  Administered 2017-11-23: 1000 mL via INTRAVENOUS

## 2017-11-23 MED ORDER — IBUPROFEN 600 MG PO TABS: 600.0000 mg | ORAL_TABLET | Freq: Three times a day (TID) | ORAL | 0 refills | Status: DC | PRN

## 2017-11-23 MED ORDER — IOPAMIDOL (ISOVUE-300) INJECTION 61%
100.0000 mL | Freq: Once | INTRAVENOUS | Status: AC | PRN
  Administered 2017-11-23: 100 mL via INTRAVENOUS

## 2017-11-23 MED ORDER — OXYCODONE-ACETAMINOPHEN 5-325 MG PO TABS: 1.0000 | ORAL_TABLET | ORAL | 0 refills | Status: DC | PRN

## 2017-11-23 MED ORDER — ONDANSETRON HCL 4 MG PO TABS: 4.0000 mg | ORAL_TABLET | Freq: Three times a day (TID) | ORAL | 0 refills | Status: DC | PRN

## 2017-11-23 NOTE — Discharge Instructions (Signed)
You are evaluated for back pain especially in the right side, and found to have a right-sided kidney stone that is passing down the ureter called ureterolithiasis.  You are being discharged with medications to help you pass the stone.  Your CT scan also showed what is called an incidental finding, this just needs close follow-up with your primary care doctor.  I have given you a result printout for you to follow-up with your primary care doctor regarding discussion about repeat imaging to follow this along.  Return to the emergency room immediately for any worsening condition including inability to urinate, uncontrolled pain, nausea and vomiting with concern for dehydration, fever, or any other symptoms concerning to you.

## 2017-11-23 NOTE — ED Notes (Signed)
During triage process, pt says he's probably been having the pain for 2-3 weeks

## 2017-11-23 NOTE — ED Notes (Signed)
Pt sleeping; no distress noted

## 2017-11-23 NOTE — ED Provider Notes (Signed)
[facility] Emergency Department Provider Note ____________________________________________   I have reviewed the triage vital signs and the triage nursing note.  HISTORY  Chief Complaint Back Pain and Dysuria   Historian Patient  HPI Edwin Donovan. is a 72 y.o. male with a history of kidney stones as well as sepsis and pneumonia last year, presents due to bilateral flank pain but more so on the right.  He states that he has some pain going down the anterior and lateral thighs on both sides and into the groin on the right side.  No particular hematuria or dysuria, but he still feels like symptoms feel mostly like kidney stones that he had prior 1 year ago.  Pain is moderate 6 out of 10 at home, currently 8 out of 10.  Nothing makes it worse or better.  Symptoms started about 24 hours ago.  Last bowel movement was 3 days ago.   Past Medical History:  Diagnosis Date  . Anxiety    ptsd  . Cancer ([place])    PROSTATE  . Chronic kidney disease    stones  . COPD (chronic obstructive pulmonary disease) ([place])   . Hypertension   . Kidney stone   . Pneumonia    06/27/16  . Shortness of breath dyspnea     Patient Active Problem List   Diagnosis Date Noted  . COPD (chronic obstructive pulmonary disease) ([place]) 11/03/2017  . Hypertension 11/03/2017  . Insomnia 11/03/2017  . Anxiety 11/03/2017  . Sepsis ([place]) 06/28/2016  . Right ureteral stone     Past Surgical History:  Procedure Laterality Date  . CYSTOSCOPY W/ URETERAL STENT PLACEMENT Right 07/29/2016   Procedure: CYSTOSCOPY WITH STENT REPLACEMENT;  Surgeon: Hollice Espy, MD;  Location: [facility] ORS;  Service: Urology;  Laterality: Right;  . CYSTOSCOPY WITH BIOPSY Right 07/29/2016   Procedure: CYSTOSCOPY WITH BIOPSY;  Surgeon: Hollice Espy, MD;  Location: [facility] ORS;  Service: Urology;  Laterality: Right;  . CYSTOSCOPY WITH STENT PLACEMENT Right 06/28/2016   Procedure: CYSTOSCOPY WITH STENT PLACEMENT;   Surgeon: Hollice Espy, MD;  Location: [facility] ORS;  Service: Urology;  Laterality: Right;  . PROSTATECTOMY    . URETEROSCOPY Right 07/29/2016   Procedure: URETEROSCOPY;  Surgeon: Hollice Espy, MD;  Location: [facility] ORS;  Service: Urology;  Laterality: Right;    Prior to Admission medications   Medication Sig Start Date End Date Taking? Authorizing Provider  albuterol (PROVENTIL HFA;VENTOLIN HFA) 108 (90 Base) MCG/ACT inhaler Inhale 1-2 puffs into the lungs every 6 (six) hours as needed for wheezing or shortness of breath.    [provider]  amLODipine (NORVASC) 10 MG tablet Take 10 mg by mouth daily.    [provider]  budesonide-formoterol (SYMBICORT) 160-4.5 MCG/ACT inhaler Inhale 2 puffs into the lungs 2 (two) times daily.    [provider]  docusate sodium (COLACE) 100 MG capsule Take 1 capsule (100 mg total) by mouth 2 (two) times daily. 07/04/16   Dustin Flock, MD  FLUoxetine (PROZAC) 20 MG capsule Take 40 mg by mouth daily.    [provider]  ibuprofen (ADVIL,MOTRIN) 600 MG tablet Take 1 tablet (600 mg total) by mouth every 8 (eight) hours as needed. 11/23/17   Lisa Roca, MD  lidocaine (LIDODERM) 5 % Place 2 patches onto the skin daily. Remove & Discard patch within 12 hours or as directed by MD 10/31/17   Volney American, PA-C  lisinopril (PRINIVIL,ZESTRIL) 10 MG tablet Take 1 tablet (10 mg  total) by mouth daily. 10/31/17   Volney American, PA-C  ondansetron (ZOFRAN) 4 MG tablet Take 1 tablet (4 mg total) by mouth every 8 (eight) hours as needed for nausea or vomiting. 11/23/17   Lisa Roca, MD  oxyCODONE-acetaminophen (ROXICET) 5-325 MG tablet Take 1 tablet by mouth every 4 (four) hours as needed for severe pain. 11/23/17   Lisa Roca, MD  tamsulosin (FLOMAX) 0.4 MG CAPS capsule Take 1 capsule (0.4 mg total) by mouth daily. 11/23/17   Lisa Roca, MD  traZODone (DESYREL) 100 MG tablet Take 100 mg by mouth at bedtime as needed for  sleep.    [provider]    No Known Allergies  Family History  Problem Relation Age of Onset  . Stroke Mother   . Heart attack Father   . Lung cancer Father   . Kidney disease Father   . Prostate cancer Neg Hx   . Bladder Cancer Neg Hx     Social History Social History   Tobacco Use  . Smoking status: Current Some Day Smoker    Types: Cigarettes  . Smokeless tobacco: Never Used  Substance Use Topics  . Alcohol use: Yes    Comment: "some beers the last couple of days"  . Drug use: Yes    Types: Marijuana    Comment: yesterday    Review of Systems  Constitutional: Negative for fever. Eyes: Negative for visual changes. ENT: Negative for sore throat. Cardiovascular: Negative for chest pain. Respiratory: Negative for shortness of breath. Gastrointestinal: Negative for nausea or vomiting. Genitourinary: Negative for dysuria. Musculoskeletal: Positive for back pain.  Reports some arthritis but no known chronic back pain. Skin: Negative for rash. Neurological: Negative for headache.  ____________________________________________   PHYSICAL EXAM:  VITAL SIGNS: ED Triage Vitals  Enc Vitals Group     BP 11/23/17 0519 (!) 142/77     Pulse Rate 11/23/17 0519 93     Resp 11/23/17 0519 18     Temp 11/23/17 0519 97.9 F (36.6 C)     Temp Source 11/23/17 0519 Oral     SpO2 11/23/17 0519 94 %     Weight 11/23/17 0520 255 lb (115.7 kg)     Height 11/23/17 0520 5\' 11"  (1.803 m)     Head Circumference --      Peak Flow --      Pain Score 11/23/17 0519 7     Pain Loc --      Pain Edu? --      Excl. in [place]? --      Constitutional: Alert and oriented. Well appearing and in no distress. HEENT   Head: Normocephalic and atraumatic.      Eyes: Conjunctivae are normal. Pupils equal and round.       Ears:         Nose: No congestion/rhinnorhea.   Mouth/Throat: Mucous membranes are moist.   Neck: No stridor. Cardiovascular/Chest: Normal rate, regular  rhythm.  No murmurs, rubs, or gallops. Respiratory: Normal respiratory effort without tachypnea nor retractions. Breath sounds are clear and equal bilaterally. No wheezes/rales/rhonchi. Gastrointestinal: Soft. No distention, no guarding, no rebound. Nontender.  Obese. Genitourinary/rectal:Deferred Musculoskeletal: Nontender midline along the spine.  Mild flank tenderness bilaterally.  Nontender with normal range of motion in all extremities. No joint effusions.  No lower extremity tenderness.  No edema. Neurologic:  Normal speech and language. No gross or focal neurologic deficits are appreciated. Skin:  Skin is warm, dry and intact. No rash noted.  Psychiatric: Mood and affect are normal. Speech and behavior are normal. Patient exhibits appropriate insight and judgment.   ____________________________________________  LABS (pertinent positives/negatives) I, Lisa Roca, MD the attending physician have reviewed the labs noted below.  Labs Reviewed  URINALYSIS, ROUTINE W REFLEX MICROSCOPIC - Abnormal; Notable for the following components:      Result Value   Color, Urine STRAW (*)    APPearance CLEAR (*)    All other components within normal limits  COMPREHENSIVE METABOLIC PANEL - Abnormal; Notable for the following components:   Glucose, Bld 121 (*)    BUN 30 (*)    All other components within normal limits  CBC WITH DIFFERENTIAL/PLATELET - Abnormal; Notable for the following components:   RBC 4.39 (*)    Lymphs Abs 0.6 (*)    All other components within normal limits  URINE CULTURE  LACTIC ACID, PLASMA  LACTIC ACID, PLASMA  LIPASE, BLOOD  TROPONIN I    ____________________________________________    EKG I, Lisa Roca, MD, the attending physician have personally viewed and interpreted all ECGs.  EKG timed at 745.  82 beats from appear normal sinus rhythm.  Few PVCs.  Narrow QRS.  Normal ST and T wave  EKG repeat from 8:11 AM.  80 bpm.  Normal sinus rhythm.  Occasional  PVCs.  Narrow QRS.  Normal axis.  Normal ST and T wave ____________________________________________  RADIOLOGY All Xrays were viewed by me.  Imaging interpreted by Radiologist, and I, Lisa Roca, MD the attending physician have reviewed the radiologist interpretation noted below.  CT abdomen pelvis with contrast:FINDINGS: Lower chest: Minimal dependent atelectasis in the posterior lower lobes  Hepatobiliary: Gallbladder and liver normal appearance  Pancreas: Normal appearance  Spleen: Normal appearance  Adrenals/Urinary Tract: Adrenal glands normal appearance. LEFT renal cysts largest at upper pole 2.3 x 2.0 cm. No additional renal mass, hydronephrosis or hydroureter. However, a 4 mm diameter calculus is identified at the RIGHT ureterovesical junction image 76. Additional tiny BILATERAL nonobstructing calculi at the inferior poles of each kidney.  Stomach/Bowel: Sigmoid diverticulosis without evidence of diverticulitis. Minimally prominent stool in rectum. Normal appendix. Stomach and small bowel loops unremarkable  Vascular/Lymphatic: Atherosclerotic calcifications aorta with aneurysmal dilatation of the mid abdominal aorta 3.2 x 3.1 cm image 48. Additional atherosclerotic calcifications throughout the iliac systems and common femoral arteries. No adenopathy.  Reproductive: Post prostatectomy.  Other: No free air or free fluid. No hernia or acute inflammatory process.  Musculoskeletal: Scattered degenerative disc and facet disease changes of thoracolumbar spine. Disc herniation at L3-L4 with question discs fragments at the ventral aspect of the thecal sac. Minimal anterolisthesis at L4-L5 with pseudo disc. Bulging disc at L1-L2.  IMPRESSION: RIGHT UVJ calculus without significant hydronephrosis or hydroureter.  Additional tiny BILATERAL nonobstructing renal calculi and LEFT renal cysts.  Small abdominal aortic aneurysm 3.2 x 3.1 cm.  Probable disc herniation  at L3-L4 with additional degenerative disc disease changes of the lumbar spine as above.  Mild sigmoid diverticulosis.  Bibasilar atelectasis.  Aortic Atherosclerosis (ICD10-I70.0).  Aortic aneurysm NOS (ICD10-I71.9). __________________________________________  PROCEDURES  Procedure(s) performed: None  Critical Care performed: None   ____________________________________________  ED COURSE / ASSESSMENT AND PLAN  Pertinent labs & imaging results that were available during my care of the patient were reviewed by me and considered in my medical decision making (see chart for details).    Patient states he was having bilateral flank pain, although more significant on the right side and if anything it reminded  him of prior kidney stone.  His urinalysis dip was reassuring without any blood or sign of urinary tract infection.  Laboratory studies are overall reassuring.  I did discuss with him obtaining CT scan given the significance of the discomfort.  CT scan does show right-sided ureterolithiasis.  Will treat symptomatically for this.  Incidentally found small aortic aneurysm, patient was discussed that he has had follow-up with his primary care doctor for this to followed along and was given a printout of the result.  DIFFERENTIAL DIAGNOSIS: Including but not limited to musculoskeletal back pain, radiculopathy, ureterolithiasis, kidney infection, diverticulitis, abdominal aortic aneurysm, obstruction, etc.  CONSULTATIONS:  None   Patient / Family / Caregiver informed of clinical course, medical decision-making process, and agree with plan.   I discussed return precautions, follow-up instructions, and discharge instructions with patient and/or family.  Discharge Instructions : You are evaluated for back pain especially in the right side, and found to have a right-sided kidney stone that is passing down the ureter called ureterolithiasis.  You are being discharged with  medications to help you pass the stone.  Your CT scan also showed what is called an incidental finding, this just needs close follow-up with your primary care doctor.  I have given you a result printout for you to follow-up with your primary care doctor regarding discussion about repeat imaging to follow this along.  Return to the emergency room immediately for any worsening condition including inability to urinate, uncontrolled pain, nausea and vomiting with concern for dehydration, fever, or any other symptoms concerning to you.    ___________________________________________   FINAL CLINICAL IMPRESSION(S) / ED DIAGNOSES   Final diagnoses:  Ureterolithiasis  Abdominal aortic aneurysm (AAA) without rupture ([place])      ___________________________________________        Note: This dictation was prepared with Dragon dictation. Any transcriptional errors that result from this process are unintentional    Lisa Roca, MD 11/23/17 1034

## 2017-11-23 NOTE — ED Notes (Signed)
Pt reports history of kidney stones reports started to have lower back pain last night unable to sleep all night denies any urinary symptoms pt talks in complete sentences no distress noted

## 2017-11-23 NOTE — ED Triage Notes (Signed)
Pt reports several days of pain across his lower back and having difficulty voiding; says he has to force his stream; pain at times radiates around right flank to right groin; denies fever; history of kidney stones; denies hematuria

## 2017-11-23 NOTE — ED Notes (Signed)
Pt verbalizes understanding of discharge instructions.

## 2017-11-23 NOTE — ED Notes (Signed)
Pt reports his pain is better reports when he is able to void relieves pain

## 2017-11-23 NOTE — ED Notes (Signed)
Pt to CT

## 2017-11-24 LAB — URINE CULTURE: Culture: NO GROWTH

## 2017-11-28 ENCOUNTER — Ambulatory Visit: Payer: Non-veteran care | Admitting: Family Medicine

## 2017-11-29 ENCOUNTER — Encounter: Payer: Self-pay | Admitting: Emergency Medicine

## 2017-11-29 ENCOUNTER — Emergency Department
Admission: EM | Admit: 2017-11-29 | Discharge: 2017-11-29 | Disposition: A | Discharge status: Home / Self Care | Payer: Medicare Other | Attending: Emergency Medicine | Admitting: Emergency Medicine

## 2017-11-29 ENCOUNTER — Other Ambulatory Visit: Payer: Self-pay

## 2017-11-29 ENCOUNTER — Emergency Department: Payer: Medicare Other

## 2017-11-29 DIAGNOSIS — I129 Hypertensive chronic kidney disease with stage 1 through stage 4 chronic kidney disease, or unspecified chronic kidney disease: Secondary | ICD-10-CM | POA: Diagnosis not present

## 2017-11-29 DIAGNOSIS — J449 Chronic obstructive pulmonary disease, unspecified: Secondary | ICD-10-CM | POA: Diagnosis not present

## 2017-11-29 DIAGNOSIS — Z79899 Other long term (current) drug therapy: Secondary | ICD-10-CM | POA: Insufficient documentation

## 2017-11-29 DIAGNOSIS — N200 Calculus of kidney: Secondary | ICD-10-CM | POA: Insufficient documentation

## 2017-11-29 DIAGNOSIS — Z8546 Personal history of malignant neoplasm of prostate: Secondary | ICD-10-CM | POA: Diagnosis not present

## 2017-11-29 DIAGNOSIS — R1031 Right lower quadrant pain: Secondary | ICD-10-CM | POA: Diagnosis present

## 2017-11-29 DIAGNOSIS — N189 Chronic kidney disease, unspecified: Secondary | ICD-10-CM | POA: Diagnosis not present

## 2017-11-29 DIAGNOSIS — Z960 Presence of urogenital implants: Secondary | ICD-10-CM | POA: Insufficient documentation

## 2017-11-29 DIAGNOSIS — F17210 Nicotine dependence, cigarettes, uncomplicated: Secondary | ICD-10-CM | POA: Insufficient documentation

## 2017-11-29 DIAGNOSIS — R109 Unspecified abdominal pain: Secondary | ICD-10-CM

## 2017-11-29 LAB — URINALYSIS, COMPLETE (UACMP) WITH MICROSCOPIC
BACTERIA UA: NONE SEEN
Bilirubin Urine: NEGATIVE
GLUCOSE, UA: NEGATIVE mg/dL
HGB URINE DIPSTICK: NEGATIVE
KETONES UR: 5 mg/dL — AB
Leukocytes, UA: NEGATIVE
NITRITE: NEGATIVE
PROTEIN: 100 mg/dL — AB
Specific Gravity, Urine: 1.021 (ref 1.005–1.030)
Squamous Epithelial / LPF: NONE SEEN
pH: 5 (ref 5.0–8.0)

## 2017-11-29 LAB — CBC
HCT: 45.8 % (ref 40.0–52.0)
Hemoglobin: 15.3 g/dL (ref 13.0–18.0)
MCH: 33.3 pg (ref 26.0–34.0)
MCHC: 33.4 g/dL (ref 32.0–36.0)
MCV: 99.8 fL (ref 80.0–100.0)
Platelets: 154 10*3/uL (ref 150–440)
RBC: 4.59 MIL/uL (ref 4.40–5.90)
RDW: 13.6 % (ref 11.5–14.5)
WBC: 5.1 10*3/uL (ref 3.8–10.6)

## 2017-11-29 LAB — BASIC METABOLIC PANEL
ANION GAP: 8 (ref 5–15)
BUN: 18 mg/dL (ref 6–20)
CALCIUM: 8.9 mg/dL (ref 8.9–10.3)
CO2: 27 mmol/L (ref 22–32)
Chloride: 100 mmol/L — ABNORMAL LOW (ref 101–111)
Creatinine, Ser: 0.99 mg/dL (ref 0.61–1.24)
GFR calc Af Amer: >60 mL/min (ref >60)
GLUCOSE: 153 mg/dL — AB (ref 65–99)
Potassium: 4.5 mmol/L (ref 3.5–5.1)
SODIUM: 135 mmol/L (ref 135–145)

## 2017-11-29 MED ORDER — TAMSULOSIN HCL 0.4 MG PO CAPS: 0.4000 mg | ORAL_CAPSULE | Freq: Every day | ORAL | 0 refills | Status: DC

## 2017-11-29 NOTE — Discharge Instructions (Signed)
Please seek medical attention for any high fevers, chest pain, shortness of breath, change in behavior, persistent vomiting, bloody stool or any other new or concerning symptoms.  

## 2017-11-29 NOTE — ED Notes (Signed)
Pt states hx kidney stones. States last week was told kidney stone and given flomax. Thought he passed it. Now c/o R sided back pain and groin pain that began yesterday. Alert, oriented. Denies hematuria. No distress noted.

## 2017-11-29 NOTE — ED Triage Notes (Signed)
Here for right flank pain. Hx stones and feels same.  Pain radiates to groin as well. No hematuria. Doesn't think has had a fever.  Got flomax last week but thinks has another in there. Ambulatory. NAD at this time. VSS

## 2017-11-29 NOTE — ED Provider Notes (Signed)
[facility] Emergency Department Provider Note   ____________________________________________   I have reviewed the triage vital signs and the nursing notes.   HISTORY  Chief Complaint Flank Pain   History limited by: Not Limited   HPI Edwin Donovan. is a 72 y.o. male who presents to the emergency department today because of concern for right flank pain and recurrent kidney stone.   LOCATION:right flank that radiates to right groin DURATION:1 day TIMING: constant SEVERITY: 7/10 QUALITY: sharp CONTEXT: patient presents to the emergency department today because of concern for right flank pain and recurrent stone. Patient was seen in the emergency department roughly 1 week ago and diagnosed with a stone in the right UVJ. The patient states he thinks he passed it since his pain did improve. However the pain has come back. MODIFYING FACTORS: none ASSOCIATED SYMPTOMS: no vomiting. No fevers. No change in color of urine. No bad odor to urine.  Per medical record review patient has a history of recent ED visit for kidney stone.  Past Medical History:  Diagnosis Date  . Anxiety    ptsd  . Cancer ([place])    PROSTATE  . Chronic kidney disease    stones  . COPD (chronic obstructive pulmonary disease) ([place])   . Hypertension   . Kidney stone   . Pneumonia    06/27/16  . Shortness of breath dyspnea     Patient Active Problem List   Diagnosis Date Noted  . COPD (chronic obstructive pulmonary disease) ([place]) 11/03/2017  . Hypertension 11/03/2017  . Insomnia 11/03/2017  . Anxiety 11/03/2017  . Sepsis ([place]) 06/28/2016  . Right ureteral stone     Past Surgical History:  Procedure Laterality Date  . CYSTOSCOPY W/ URETERAL STENT PLACEMENT Right 07/29/2016   Procedure: CYSTOSCOPY WITH STENT REPLACEMENT;  Surgeon: Hollice Espy, MD;  Location: [facility] ORS;  Service: Urology;  Laterality: Right;  . CYSTOSCOPY WITH BIOPSY Right 07/29/2016   Procedure:  CYSTOSCOPY WITH BIOPSY;  Surgeon: Hollice Espy, MD;  Location: [facility] ORS;  Service: Urology;  Laterality: Right;  . CYSTOSCOPY WITH STENT PLACEMENT Right 06/28/2016   Procedure: CYSTOSCOPY WITH STENT PLACEMENT;  Surgeon: Hollice Espy, MD;  Location: [facility] ORS;  Service: Urology;  Laterality: Right;  . PROSTATECTOMY    . URETEROSCOPY Right 07/29/2016   Procedure: URETEROSCOPY;  Surgeon: Hollice Espy, MD;  Location: [facility] ORS;  Service: Urology;  Laterality: Right;    Prior to Admission medications   Medication Sig Start Date End Date Taking? Authorizing Provider  albuterol (PROVENTIL HFA;VENTOLIN HFA) 108 (90 Base) MCG/ACT inhaler Inhale 1-2 puffs into the lungs every 6 (six) hours as needed for wheezing or shortness of breath.    [provider]  amLODipine (NORVASC) 10 MG tablet Take 10 mg by mouth daily.    [provider]  budesonide-formoterol (SYMBICORT) 160-4.5 MCG/ACT inhaler Inhale 2 puffs into the lungs 2 (two) times daily.    [provider]  docusate sodium (COLACE) 100 MG capsule Take 1 capsule (100 mg total) by mouth 2 (two) times daily. 07/04/16   Dustin Flock, MD  FLUoxetine (PROZAC) 20 MG capsule Take 40 mg by mouth daily.    [provider]  ibuprofen (ADVIL,MOTRIN) 600 MG tablet Take 1 tablet (600 mg total) by mouth every 8 (eight) hours as needed. 11/23/17   Lisa Roca, MD  lidocaine (LIDODERM) 5 % Place 2 patches onto the skin daily. Remove & Discard patch within 12 hours or as directed by MD  10/31/17   Volney American, PA-C  lisinopril (PRINIVIL,ZESTRIL) 10 MG tablet Take 1 tablet (10 mg total) by mouth daily. 10/31/17   Volney American, PA-C  ondansetron (ZOFRAN) 4 MG tablet Take 1 tablet (4 mg total) by mouth every 8 (eight) hours as needed for nausea or vomiting. 11/23/17   Lisa Roca, MD  oxyCODONE-acetaminophen (ROXICET) 5-325 MG tablet Take 1 tablet by mouth every 4 (four) hours as needed for severe pain. 11/23/17    Lisa Roca, MD  tamsulosin (FLOMAX) 0.4 MG CAPS capsule Take 1 capsule (0.4 mg total) by mouth daily. 11/23/17   Lisa Roca, MD  traZODone (DESYREL) 100 MG tablet Take 100 mg by mouth at bedtime as needed for sleep.    [provider]    Allergies Patient has no known allergies.  Family History  Problem Relation Age of Onset  . Stroke Mother   . Heart attack Father   . Lung cancer Father   . Kidney disease Father   . Prostate cancer Neg Hx   . Bladder Cancer Neg Hx     Social History Social History   Tobacco Use  . Smoking status: Current Some Day Smoker    Types: Cigarettes  . Smokeless tobacco: Never Used  Substance Use Topics  . Alcohol use: Yes    Comment: "some beers the last couple of days"  . Drug use: Yes    Types: Marijuana    Comment: yesterday    Review of Systems Constitutional: No fever/chills Eyes: No visual changes. ENT: No sore throat. Cardiovascular: Denies chest pain. Respiratory: Denies shortness of breath. Gastrointestinal: Positive for right flank/groin pain. Genitourinary: Negative for dysuria. Musculoskeletal: Negative for back pain. Skin: Negative for rash. Neurological: Negative for headaches, focal weakness or numbness.  ____________________________________________   PHYSICAL EXAM:  VITAL SIGNS: ED Triage Vitals  Enc Vitals Group     BP 11/29/17 1414 131/71     Pulse Rate 11/29/17 1414 96     Resp 11/29/17 1414 18     Temp 11/29/17 1414 97.8 F (36.6 C)     Temp Source 11/29/17 1414 Oral     SpO2 11/29/17 1414 95 %     Weight 11/29/17 1414 255 lb (115.7 kg)     Height 11/29/17 1414 5\' 11"  (1.803 m)     Head Circumference --      Peak Flow --      Pain Score 11/29/17 1413 7   Constitutional: Alert and oriented. Well appearing and in no distress. Eyes: Conjunctivae are normal.  ENT   Head: Normocephalic and atraumatic.   Nose: No congestion/rhinnorhea.   Mouth/Throat: Mucous membranes are moist.    Neck: No stridor. Hematological/Lymphatic/Immunilogical: No cervical lymphadenopathy. Cardiovascular: Normal rate, regular rhythm.  No murmurs, rubs, or gallops. Respiratory: Normal respiratory effort without tachypnea nor retractions. Breath sounds are clear and equal bilaterally. No wheezes/rales/rhonchi. Gastrointestinal: Soft and non tender. No rebound. No guarding.  Genitourinary: Deferred Musculoskeletal: Normal range of motion in all extremities. No lower extremity edema. Neurologic:  Normal speech and language. No gross focal neurologic deficits are appreciated.  Skin:  Skin is warm, dry and intact. No rash noted. Psychiatric: Mood and affect are normal. Speech and behavior are normal. Patient exhibits appropriate insight and judgment.  ____________________________________________    LABS (pertinent positives/negatives)  Cbc wnl Bmp wnl except chl 100, glu 153 ua negative hgb, 0-5 rbc, 0-5 wbc  ____________________________________________   EKG  None  ____________________________________________    RADIOLOGY  CT renal Right UVJ stone 3 mm with mild hydronephrosis.   ____________________________________________   PROCEDURES  Procedures  ____________________________________________   INITIAL IMPRESSION / ASSESSMENT AND PLAN / ED COURSE  Pertinent labs & imaging results that were available during my care of the patient were reviewed by me and considered in my medical decision making (see chart for details).  Patient presented to the emergency department today because of concerns for right flank pain and recurrent stone disease.  CT scan does show a 3 mm stone in the no signs of a kidney damage or infection.  Patient's pain is well controlled.  Will give patient prescription for Flomax.  He is arranging follow-up with urology.  Discussed results and plan with patient.  ____________________________________________   FINAL CLINICAL IMPRESSION(S) / ED  DIAGNOSES  Final diagnoses:  Right flank pain  Kidney stone     Note: This dictation was prepared with Dragon dictation. Any transcriptional errors that result from this process are unintentional     Nance Pear, MD 11/29/17 1930

## 2017-12-02 ENCOUNTER — Ambulatory Visit (INDEPENDENT_AMBULATORY_CARE_PROVIDER_SITE_OTHER): Payer: Medicare Other | Admitting: Family Medicine

## 2017-12-02 ENCOUNTER — Encounter: Payer: Self-pay | Admitting: Family Medicine

## 2017-12-02 VITALS — BP 120/74 | HR 62 | Temp 99.2°F | Wt 254.2 lb

## 2017-12-02 DIAGNOSIS — I10 Essential (primary) hypertension: Secondary | ICD-10-CM | POA: Diagnosis not present

## 2017-12-02 NOTE — H&P (View-Only) (Signed)
12/03/2017 2:52 PM   [name] 07-18-1946 [id number]  Referring provider: Volney American, PA-C [address] [place], [place] [address]  Chief Complaint  Patient presents with  . Nephrolithiasis    HPI: 72 yo WM with a history of nephrolithiasis, prostate cancer and a right renal mass who is referred from the ED for a obstructing right 3 mm UVJ stone.  He presented to the ED on 11/23/2017 after experiencing 24 hours of right sided flank pain.  It was radiating to the right thigh and groin.  He stated the pain felt like previous stones.  The pain was 6/10 at home and reached 8/10 in the ED.  He did not notice anything that made the pain worse or better.    CT Renal stone completed in the ED noted the adrenal glands are normal. There is a 1 mm nonobstructing stone in the lower pole left kidney. There are several cysts within the left kidney. In the lateral upper pole left kidney, there is a 7 mm hyperdense lesion probably hemorrhagic cysts. There is no left hydronephrosis. There is mild right perinephric stranding. There is a 3 mm stone in the distal right ureter just proximal to the ureteral vesicular junction. There is mild right hydronephrosis. The bladder is normal.  He was given Advil,  Zofran and Roxicet in the ED.   His UA was negative, his WBC count was normal and his serum creatinine was 0.99.    Today, he is still having some mild right flank pain.  3/10 pain.  Nothing is making the pain worse or better.   He has been septic with a stone in the past and is quite anxious about having a stone at this time.  He would like to "take care of it."  His UA today is negative.    He had been a patient at the [facility] and was having insurance issues and that is why he did not have his CTU as ordered.  He has now changed insurance and transferred his care to [place].    He had a PSA drawn this summer at the [place] [facility] and he believes it was 0.2.     PMH: Past Medical History:  Diagnosis  Date  . Anxiety    ptsd  . Cancer ([place])    PROSTATE  . Chronic kidney disease    stones  . COPD (chronic obstructive pulmonary disease) ([place])   . Hypertension   . Kidney stone   . Pneumonia    06/27/16  . Shortness of breath dyspnea     Surgical History: Past Surgical History:  Procedure Laterality Date  . CYSTOSCOPY W/ URETERAL STENT PLACEMENT Right 07/29/2016   Procedure: CYSTOSCOPY WITH STENT REPLACEMENT;  Surgeon: Hollice Espy, MD;  Location: [facility] OR[facility];  Service: Urology;  Laterality: Right;  . CYSTOSCOPY WITH BIOPSY Right 07/29/2016   Procedure: CYSTOSCOPY WITH BIOPSY;  Surgeon: Hollice Espy, MD;  Location: [facility];  Service: Urology;  Laterality: Right;  . CYSTOSCOPY WITH STENT PLACEMENT Right 06/28/2016   Procedure: CYSTOSCOPY WITH STENT PLACEMENT;  Surgeon: Hollice Espy, MD;  Location: [facility];  Service: Urology;  Laterality: Right;  . PROSTATECTOMY    . URETEROSCOPY Right 07/29/2016   Procedure: URETEROSCOPY;  Surgeon: Hollice Espy, MD;  Location: [facility];  Service: Urology;  Laterality: Right;    Home Medications:  Allergies as of 12/03/2017   No Known Allergies     Medication List  Accurate as of 12/03/17  2:52 PM. Always use your most recent med list.          albuterol 108 (90 Base) MCG/ACT inhaler Commonly known as:  PROVENTIL HFA;VENTOLIN HFA Inhale 1-2 puffs into the lungs every 6 (six) hours as needed for wheezing or shortness of breath.   Alprostadil (Vasodilator) 500 MCG Pllt Commonly known as:  MUSE Place 1 each into the urethra as needed.   amLODipine 10 MG tablet Commonly known as:  NORVASC Take 10 mg by mouth daily.   budesonide-formoterol 160-4.5 MCG/ACT inhaler Commonly known as:  SYMBICORT Inhale 2 puffs into the lungs 2 (two) times daily.   docusate sodium 100 MG capsule Commonly known as:  COLACE Take 1 capsule (100 mg total) by mouth 2 (two) times daily.   FLUoxetine 20 MG capsule Commonly known as:  PROZAC Take 40  mg by mouth daily.   ibuprofen 600 MG tablet Commonly known as:  ADVIL,MOTRIN Take 1 tablet (600 mg total) by mouth every 8 (eight) hours as needed.   lidocaine 5 % Commonly known as:  LIDODERM Place 2 patches onto the skin daily. Remove & Discard patch within 12 hours or as directed by MD   lisinopril 10 MG tablet Commonly known as:  PRINIVIL,ZESTRIL Take 1 tablet (10 mg total) by mouth daily.   ondansetron 4 MG tablet Commonly known as:  ZOFRAN Take 1 tablet (4 mg total) by mouth every 8 (eight) hours as needed for nausea or vomiting.   oxyCODONE-acetaminophen 5-325 MG tablet Commonly known as:  ROXICET Take 1 tablet by mouth every 4 (four) hours as needed for severe pain.   tamsulosin 0.4 MG Caps capsule Commonly known as:  FLOMAX Take 1 capsule (0.4 mg total) by mouth daily.   traZODone 100 MG tablet Commonly known as:  DESYREL Take 100 mg by mouth at bedtime as needed for sleep.       Allergies: No Known Allergies  Family History: Family History  Problem Relation Age of Onset  . Stroke Mother   . Heart attack Father   . Lung cancer Father   . Kidney disease Father   . Prostate cancer Neg Hx   . Bladder Cancer Neg Hx     Social History:  reports that he has been smoking cigarettes.  he has never used smokeless tobacco. He reports that he drinks alcohol. He reports that he uses drugs. Drug: Marijuana.  ROS: UROLOGY Frequent Urination?: Yes Hard to postpone urination?: Yes Burning/pain with urination?: No Get up at night to urinate?: No Leakage of urine?: Yes Urine stream starts and stops?: No Trouble starting stream?: No Do you have to strain to urinate?: No Blood in urine?: No Urinary tract infection?: No Sexually transmitted disease?: No Injury to kidneys or bladder?: No Painful intercourse?: No Weak stream?: No Erection problems?: No Penile pain?: No  Gastrointestinal Nausea?: No Vomiting?: No Indigestion/heartburn?: No Diarrhea?:  No Constipation?: Yes  Constitutional Fever: No Night sweats?: No Weight loss?: No Fatigue?: No  Skin Skin rash/lesions?: No Itching?: No  Eyes Blurred vision?: No Double vision?: No  Ears/Nose/Throat Sore throat?: No Sinus problems?: No  Hematologic/Lymphatic Swollen glands?: No Easy bruising?: No  Cardiovascular Leg swelling?: No Chest pain?: No  Respiratory Cough?: No Shortness of breath?: No  Endocrine Excessive thirst?: No  Musculoskeletal Back pain?: Yes Joint pain?: Yes  Neurological Headaches?: No Dizziness?: No  Psychologic Depression?: No Anxiety?: No  Physical Exam: BP 100/64   Pulse 99   Ht  5' 11" (1.803 m)   Wt 255 lb (115.7 kg)   BMI 35.57 kg/m   Constitutional: Well nourished. Alert and oriented, No acute distress. HEENT: [place] AT, moist mucus membranes. Trachea midline, no masses. Cardiovascular: No clubbing, cyanosis, or edema. Respiratory: Normal respiratory effort, no increased work of breathing. GI: Abdomen is soft, non tender, non distended, no abdominal masses. Liver and spleen not palpable.  No hernias appreciated.  Stool sample for occult testing is not indicated.   GU: No CVA tenderness.  No bladder fullness or masses.  Patient with circumcised phallus.   Urethral meatus is patent.  No penile discharge. No penile lesions or rashes. Scrotum without lesions, cysts, rashes and/or edema.  Testicles are located scrotally bilaterally. No masses are appreciated in the testicles. Left and right epididymis are normal. Rectal: Patient with  normal sphincter tone. Anus and perineum without scarring or rashes. No rectal masses are appreciated. Prostate is surgically absent. Skin: No rashes, bruises or suspicious lesions. Lymph: No cervical or inguinal adenopathy. Neurologic: Grossly intact, no focal deficits, moving all 4 extremities. Psychiatric: Normal mood and affect.  Laboratory Data: Lab Results  Component Value Date   WBC 5.1  11/29/2017   HGB 15.3 11/29/2017   HCT 45.8 11/29/2017   MCV 99.8 11/29/2017   PLT 154 11/29/2017    Lab Results  Component Value Date   CREATININE 0.99 11/29/2017    No results found for: PSA  No results found for: TESTOSTERONE  Lab Results  Component Value Date   HGBA1C 6.0 06/28/2016    Lab Results  Component Value Date   TSH 3.648 06/27/2016    No results found for: CHOL, HDL, CHOLHDL, VLDL, LDLCALC  Lab Results  Component Value Date   AST 27 11/23/2017   Lab Results  Component Value Date   ALT 21 11/23/2017   No components found for: ALKALINEPHOPHATASE No components found for: BILIRUBINTOTAL  No results found for: ESTRADIOL  Urinalysis    Component Value Date/Time   COLORURINE AMBER (A) 11/29/2017 1415   APPEARANCEUR HAZY (A) 11/29/2017 1415   APPEARANCEUR Clear 10/21/2016 1612   LABSPEC 1.021 11/29/2017 1415   PHURINE 5.0 11/29/2017 1415   GLUCOSEU NEGATIVE 11/29/2017 1415   HGBUR NEGATIVE 11/29/2017 1415   BILIRUBINUR NEGATIVE 11/29/2017 1415   BILIRUBINUR Negative 10/21/2016 1612   KETONESUR 5 (A) 11/29/2017 1415   PROTEINUR 100 (A) 11/29/2017 1415   NITRITE NEGATIVE 11/29/2017 1415   LEUKOCYTESUR NEGATIVE 11/29/2017 1415   LEUKOCYTESUR Negative 10/21/2016 1612   Today's UA was negative.   I have reviewed the labs.   Pertinent Imaging: CLINICAL DATA:  Right flank pain.  EXAM: CT ABDOMEN AND PELVIS WITHOUT CONTRAST  TECHNIQUE: Multidetector CT imaging of the abdomen and pelvis was performed following the standard protocol without IV contrast.  COMPARISON:  November 23, 2017  FINDINGS: Lower chest: No acute abnormality.  Hepatobiliary: No focal liver abnormality is seen. No gallstones, gallbladder wall thickening, or biliary dilatation.  Pancreas: Unremarkable. No pancreatic ductal dilatation or surrounding inflammatory changes.  Spleen: Normal in size without focal abnormality.  Adrenals/Urinary Tract: The adrenal  glands are normal. There is a 1 mm nonobstructing stone in the lower pole left kidney. There are several cysts within the left kidney. In the lateral upper pole left kidney, there is a 7 mm hyperdense lesion probably hemorrhagic cysts. There is no left hydronephrosis. There is mild right perinephric stranding. There is a 3 mm stone in the distal right ureter just proximal to   the ureteral vesicular junction. There is mild right hydronephrosis. The bladder is normal.  Stomach/Bowel: Stomach is within normal limits. Appendix appears normal. No evidence of bowel wall thickening, distention, or inflammatory changes.  Vascular/Lymphatic: Aortic atherosclerosis. No enlarged abdominal or pelvic lymph nodes.  Reproductive: Patient status post prior prostatectomy  Other: None.  Musculoskeletal: No acute abnormality. Degenerative joint changes of the spine are identified.  IMPRESSION: Mild right hydronephrosis due to obstruction by 3 mm stone just proximal to the right ureteral vesicular junction.   Electronically Signed   By: Wei-Chen  Lin M.D.   On: 11/29/2017 18:20 I have independently reviewed the films.    Assessment & Plan:    1. Right ureteral stone  - explained to the patient that AUA Guidelines for patients with uncomplicated ureteral stones ?10 mm should be offered observation, and those with distal stones of similar size should be offered MET with ?-blockers  - if after 4 to 6 weeks observation with or without MET is not successful we would pursue URS or ESWL, if the patient/clinician decide to intervene sooner based on a shared decision making approach, the clinicians should offer definitive stone treatment  -URS would be the first lined therapy if stone(s) do not pass, but ESWL is the procedure with the least morbidity and lowest complication rate, but URS has a greater stone-free rate in a single procedure  - Patient would like to pursue right ESWL  - I explained  that ESWL is a means of pulverizing urinary stones without surgery using shockwave therapy  - I discussed the risks involved with ESWL consist of bruising to the skin and kidney region as a result of the shockwave, possibility of long-term kidney damage, development of high blood pressure and damage to the bowel or long, hematuria, urinary bleeding serious enough to require transfusion or surgical repair or removal of the kidney is rare, rare chance of hematoma formation in the kidney and injuries to the spleen, liver or pancreas.  There is also the risk of urinary tract infection or any infection of the blood system or tissue.   There is also a possibility of resultant damage to male organs.  - I explained that sometimes the stone fragments can stack up in the ureter like coins, a phenomenon described as "Steinstrasse", that would result in a stent placement and/or URS for further treatment  - I informed the patient that IV sedation is typically used on the truck, but it some rare instances we need to use general anesthesia - the risks being infection, irregular heart beat, irregular BP, stroke, MI, CVA, paralysis, coma and/or death.  2. Right hydronephrosis  - obtain RUS to ensure the hydronephrosis has resolved once recovered from ESWL    3. History of hematuria  - UA today is negative  - continue to monitor the patient's UA after the treatment/passage of the stone to ensure the hematuria has resolved  - if hematuria persists, we will pursue a hematuria workup with CT Urogram and cystoscopy if appropriate.  4. History of prostate cancer  - [facility]  - PSA drawn today - [facility]  5. Right renal mass  - will address once he has undergone ESWL    Return for right ESWL.  These notes generated with voice recognition software. I apologize for typographical errors.  Lauralee Waters,  PA-C  [facility] [address],   [place], [place] [address] [phone]

## 2017-12-02 NOTE — Progress Notes (Signed)
12/03/2017 2:52 PM   [name] 07-18-1946 [id number]  Referring provider: Volney American, PA-C [address] [place], [place] [address]  Chief Complaint  Patient presents with  . Nephrolithiasis    HPI: 72 yo WM with a history of nephrolithiasis, prostate cancer and a right renal mass who is referred from the ED for a obstructing right 3 mm UVJ stone.  He presented to the ED on 11/23/2017 after experiencing 24 hours of right sided flank pain.  It was radiating to the right thigh and groin.  He stated the pain felt like previous stones.  The pain was 6/10 at home and reached 8/10 in the ED.  He did not notice anything that made the pain worse or better.    CT Renal stone completed in the ED noted the adrenal glands are normal. There is a 1 mm nonobstructing stone in the lower pole left kidney. There are several cysts within the left kidney. In the lateral upper pole left kidney, there is a 7 mm hyperdense lesion probably hemorrhagic cysts. There is no left hydronephrosis. There is mild right perinephric stranding. There is a 3 mm stone in the distal right ureter just proximal to the ureteral vesicular junction. There is mild right hydronephrosis. The bladder is normal.  He was given Advil,  Zofran and Roxicet in the ED.   His UA was negative, his WBC count was normal and his serum creatinine was 0.99.    Today, he is still having some mild right flank pain.  3/10 pain.  Nothing is making the pain worse or better.   He has been septic with a stone in the past and is quite anxious about having a stone at this time.  He would like to "take care of it."  His UA today is negative.    He had been a patient at the [facility] and was having insurance issues and that is why he did not have his CTU as ordered.  He has now changed insurance and transferred his care to [place].    He had a PSA drawn this summer at the [facility] and he believes it was 0.2.     PMH: Past Medical History:  Diagnosis  Date  . Anxiety    ptsd  . Cancer ([place])    PROSTATE  . Chronic kidney disease    stones  . COPD (chronic obstructive pulmonary disease) ([place])   . Hypertension   . Kidney stone   . Pneumonia    06/27/16  . Shortness of breath dyspnea     Surgical History: Past Surgical History:  Procedure Laterality Date  . CYSTOSCOPY W/ URETERAL STENT PLACEMENT Right 07/29/2016   Procedure: CYSTOSCOPY WITH STENT REPLACEMENT;  Surgeon: Hollice Espy, MD;  Location: [facility] OR[facility];  Service: Urology;  Laterality: Right;  . CYSTOSCOPY WITH BIOPSY Right 07/29/2016   Procedure: CYSTOSCOPY WITH BIOPSY;  Surgeon: Hollice Espy, MD;  Location: [facility];  Service: Urology;  Laterality: Right;  . CYSTOSCOPY WITH STENT PLACEMENT Right 06/28/2016   Procedure: CYSTOSCOPY WITH STENT PLACEMENT;  Surgeon: Hollice Espy, MD;  Location: [facility];  Service: Urology;  Laterality: Right;  . PROSTATECTOMY    . URETEROSCOPY Right 07/29/2016   Procedure: URETEROSCOPY;  Surgeon: Hollice Espy, MD;  Location: [facility] OR[facility];  Service: Urology;  Laterality: Right;    Home Medications:  Allergies as of 12/03/2017   No Known Allergies     Medication List  Accurate as of 12/03/17  2:52 PM. Always use your most recent med list.          albuterol 108 (90 Base) MCG/ACT inhaler Commonly known as:  PROVENTIL HFA;VENTOLIN HFA Inhale 1-2 puffs into the lungs every 6 (six) hours as needed for wheezing or shortness of breath.   Alprostadil (Vasodilator) 500 MCG Pllt Commonly known as:  MUSE Place 1 each into the urethra as needed.   amLODipine 10 MG tablet Commonly known as:  NORVASC Take 10 mg by mouth daily.   budesonide-formoterol 160-4.5 MCG/ACT inhaler Commonly known as:  SYMBICORT Inhale 2 puffs into the lungs 2 (two) times daily.   docusate sodium 100 MG capsule Commonly known as:  COLACE Take 1 capsule (100 mg total) by mouth 2 (two) times daily.   FLUoxetine 20 MG capsule Commonly known as:  PROZAC Take 40  mg by mouth daily.   ibuprofen 600 MG tablet Commonly known as:  ADVIL,MOTRIN Take 1 tablet (600 mg total) by mouth every 8 (eight) hours as needed.   lidocaine 5 % Commonly known as:  LIDODERM Place 2 patches onto the skin daily. Remove & Discard patch within 12 hours or as directed by MD   lisinopril 10 MG tablet Commonly known as:  PRINIVIL,ZESTRIL Take 1 tablet (10 mg total) by mouth daily.   ondansetron 4 MG tablet Commonly known as:  ZOFRAN Take 1 tablet (4 mg total) by mouth every 8 (eight) hours as needed for nausea or vomiting.   oxyCODONE-acetaminophen 5-325 MG tablet Commonly known as:  ROXICET Take 1 tablet by mouth every 4 (four) hours as needed for severe pain.   tamsulosin 0.4 MG Caps capsule Commonly known as:  FLOMAX Take 1 capsule (0.4 mg total) by mouth daily.   traZODone 100 MG tablet Commonly known as:  DESYREL Take 100 mg by mouth at bedtime as needed for sleep.       Allergies: No Known Allergies  Family History: Family History  Problem Relation Age of Onset  . Stroke Mother   . Heart attack Father   . Lung cancer Father   . Kidney disease Father   . Prostate cancer Neg Hx   . Bladder Cancer Neg Hx     Social History:  reports that he has been smoking cigarettes.  he has never used smokeless tobacco. He reports that he drinks alcohol. He reports that he uses drugs. Drug: Marijuana.  ROS: UROLOGY Frequent Urination?: Yes Hard to postpone urination?: Yes Burning/pain with urination?: No Get up at night to urinate?: No Leakage of urine?: Yes Urine stream starts and stops?: No Trouble starting stream?: No Do you have to strain to urinate?: No Blood in urine?: No Urinary tract infection?: No Sexually transmitted disease?: No Injury to kidneys or bladder?: No Painful intercourse?: No Weak stream?: No Erection problems?: No Penile pain?: No  Gastrointestinal Nausea?: No Vomiting?: No Indigestion/heartburn?: No Diarrhea?:  No Constipation?: Yes  Constitutional Fever: No Night sweats?: No Weight loss?: No Fatigue?: No  Skin Skin rash/lesions?: No Itching?: No  Eyes Blurred vision?: No Double vision?: No  Ears/Nose/Throat Sore throat?: No Sinus problems?: No  Hematologic/Lymphatic Swollen glands?: No Easy bruising?: No  Cardiovascular Leg swelling?: No Chest pain?: No  Respiratory Cough?: No Shortness of breath?: No  Endocrine Excessive thirst?: No  Musculoskeletal Back pain?: Yes Joint pain?: Yes  Neurological Headaches?: No Dizziness?: No  Psychologic Depression?: No Anxiety?: No  Physical Exam: BP 100/64   Pulse 99   Ht  _0  (1.803 m)   Wt 255 lb (115.7 kg)   BMI 35.57 kg/m   Constitutional: Well nourished. Alert and oriented, No acute distress. HEENT: [place] AT, moist mucus membranes. Trachea midline, no masses. Cardiovascular: No clubbing, cyanosis, or edema. Respiratory: Normal respiratory effort, no increased work of breathing. GI: Abdomen is soft, non tender, non distended, no abdominal masses. Liver and spleen not palpable.  No hernias appreciated.  Stool sample for occult testing is not indicated.   GU: No CVA tenderness.  No bladder fullness or masses.  Patient with circumcised phallus.   Urethral meatus is patent.  No penile discharge. No penile lesions or rashes. Scrotum without lesions, cysts, rashes and/or edema.  Testicles are located scrotally bilaterally. No masses are appreciated in the testicles. Left and right epididymis are normal. Rectal: Patient with  normal sphincter tone. Anus and perineum without scarring or rashes. No rectal masses are appreciated. Prostate is surgically absent. Skin: No rashes, bruises or suspicious lesions. Lymph: No cervical or inguinal adenopathy. Neurologic: Grossly intact, no focal deficits, moving all 4 extremities. Psychiatric: Normal mood and affect.  Laboratory Data: Lab Results  Component Value Date   WBC 5.1  11/29/2017   HGB 15.3 11/29/2017   HCT 45.8 11/29/2017   MCV 99.8 11/29/2017   PLT 154 11/29/2017    Lab Results  Component Value Date   CREATININE 0.99 11/29/2017    No results found for: PSA  No results found for: TESTOSTERONE  Lab Results  Component Value Date   HGBA1C 6.0 06/28/2016    Lab Results  Component Value Date   TSH 3.648 06/27/2016    No results found for: CHOL, HDL, CHOLHDL, VLDL, LDLCALC  Lab Results  Component Value Date   AST 27 11/23/2017   Lab Results  Component Value Date   ALT 21 11/23/2017   No components found for: ALKALINEPHOPHATASE No components found for: BILIRUBINTOTAL  No results found for: ESTRADIOL  Urinalysis    Component Value Date/Time   COLORURINE AMBER (A) 11/29/2017 1415   APPEARANCEUR HAZY (A) 11/29/2017 1415   APPEARANCEUR Clear 10/21/2016 1612   LABSPEC 1.021 11/29/2017 1415   PHURINE 5.0 11/29/2017 1415   GLUCOSEU NEGATIVE 11/29/2017 1415   HGBUR NEGATIVE 11/29/2017 1415   BILIRUBINUR NEGATIVE 11/29/2017 1415   BILIRUBINUR Negative 10/21/2016 1612   KETONESUR 5 (A) 11/29/2017 1415   PROTEINUR 100 (A) 11/29/2017 1415   NITRITE NEGATIVE 11/29/2017 1415   LEUKOCYTESUR NEGATIVE 11/29/2017 1415   LEUKOCYTESUR Negative 10/21/2016 1612   Today's UA was negative.   I have reviewed the labs.   Pertinent Imaging: CLINICAL DATA:  Right flank pain.  EXAM: CT ABDOMEN AND PELVIS WITHOUT CONTRAST  TECHNIQUE: Multidetector CT imaging of the abdomen and pelvis was performed following the standard protocol without IV contrast.  COMPARISON:  November 23, 2017  FINDINGS: Lower chest: No acute abnormality.  Hepatobiliary: No focal liver abnormality is seen. No gallstones, gallbladder wall thickening, or biliary dilatation.  Pancreas: Unremarkable. No pancreatic ductal dilatation or surrounding inflammatory changes.  Spleen: Normal in size without focal abnormality.  Adrenals/Urinary Tract: The adrenal  glands are normal. There is a 1 mm nonobstructing stone in the lower pole left kidney. There are several cysts within the left kidney. In the lateral upper pole left kidney, there is a 7 mm hyperdense lesion probably hemorrhagic cysts. There is no left hydronephrosis. There is mild right perinephric stranding. There is a 3 mm stone in the distal right ureter just proximal to  the ureteral vesicular junction. There is mild right hydronephrosis. The bladder is normal.  Stomach/Bowel: Stomach is within normal limits. Appendix appears normal. No evidence of bowel wall thickening, distention, or inflammatory changes.  Vascular/Lymphatic: Aortic atherosclerosis. No enlarged abdominal or pelvic lymph nodes.  Reproductive: Patient status post prior prostatectomy  Other: None.  Musculoskeletal: No acute abnormality. Degenerative joint changes of the spine are identified.  IMPRESSION: Mild right hydronephrosis due to obstruction by 3 mm stone just proximal to the right ureteral vesicular junction.   Electronically Signed   By: Abelardo Diesel M.D.   On: 11/29/2017 18:20 I have independently reviewed the films.    Assessment & Plan:    1. Right ureteral stone  - explained to the patient that AUA Guidelines for patients with uncomplicated ureteral stones ?10 mm should be offered observation, and those with distal stones of similar size should be offered MET with ?-blockers  - if after 4 to 6 weeks observation with or without MET is not successful we would pursue URS or ESWL, if the patient/clinician decide to intervene sooner based on a shared decision making approach, the clinicians should offer definitive stone treatment  -URS would be the first lined therapy if stone(s) do not pass, but ESWL is the procedure with the least morbidity and lowest complication rate, but URS has a greater stone-free rate in a single procedure  - Patient would like to pursue right ESWL  - I explained  that ESWL is a means of pulverizing urinary stones without surgery using shockwave therapy  - I discussed the risks involved with ESWL consist of bruising to the skin and kidney region as a result of the shockwave, possibility of long-term kidney damage, development of high blood pressure and damage to the bowel or long, hematuria, urinary bleeding serious enough to require transfusion or surgical repair or removal of the kidney is rare, rare chance of hematoma formation in the kidney and injuries to the spleen, liver or pancreas.  There is also the risk of urinary tract infection or any infection of the blood system or tissue.   There is also a possibility of resultant damage to male organs.  - I explained that sometimes the stone fragments can stack up in the ureter like coins, a phenomenon described as "Steinstrasse", that would result in a stent placement and/or URS for further treatment  - I informed the patient that IV sedation is typically used on the truck, but it some rare instances we need to use general anesthesia - the risks being infection, irregular heart beat, irregular BP, stroke, MI, CVA, paralysis, coma and/or death.  2. Right hydronephrosis  - obtain RUS to ensure the hydronephrosis has resolved once recovered from ESWL    3. History of hematuria  - UA today is negative  - continue to monitor the patient's UA after the treatment/passage of the stone to ensure the hematuria has resolved  - if hematuria persists, we will pursue a hematuria workup with CT Urogram and cystoscopy if appropriate.  4. History of prostate cancer  - treated with open prostatectomy at the [facility]  - followed at the [facility] until recently  - PSA drawn today - will request the records from the [facility]  5. Right renal mass  - will address once he has undergone ESWL    Return for right ESWL.  These notes generated with voice recognition software. I apologize for typographical errors.  Zara Council,  PA-C  [facility] [address],  [place], [place] [address] [phone]

## 2017-12-02 NOTE — Progress Notes (Signed)
BP 120/74 (BP Location: Right Arm, Patient Position: Sitting, Cuff Size: Large)   Pulse 62   Temp 99.2 F (37.3 C) (Temporal)   Wt 254 lb 3.2 oz (115.3 kg)   SpO2 99%   BMI 35.45 kg/m    Subjective:    Patient ID: Edwin Donovan., male    DOB: 1946-10-22, 72 y.o.   MRN: [id number]  HPI: Edwin Donovan is a 72 y.o. male  Chief Complaint  Patient presents with  . Follow-up  . Hypertension  . Hospitalization Follow-up  . Nephrolithiasis   Pt here today for BP f/u. Taking lisinopril and amlodipine with good results. BPs always WNL when checked. Getting medications from [facility] as it's much cheaper for him that way. Denies HAs, dizziness, CP, SOB. Compliant with medications. Continues to work on [employer] and increasing physical activity. Had normal BMP in hospital 3 days ago during two back to back ER visits for kidney stones.   Relevant past medical, surgical, family and social history reviewed and updated as indicated. Interim medical history since our last visit reviewed. Allergies and medications reviewed and updated.  Review of Systems  Constitutional: Negative.   Respiratory: Negative.   Cardiovascular: Negative.   Gastrointestinal: Negative.   Musculoskeletal: Negative.   Neurological: Negative.   Psychiatric/Behavioral: Negative.    Per HPI unless specifically indicated above     Objective:    BP 120/74 (BP Location: Right Arm, Patient Position: Sitting, Cuff Size: Large)   Pulse 62   Temp 99.2 F (37.3 C) (Temporal)   Wt 254 lb 3.2 oz (115.3 kg)   SpO2 99%   BMI 35.45 kg/m   Wt Readings from Last 3 Encounters:  12/04/17 255 lb (115.7 kg)  12/03/17 255 lb (115.7 kg)  12/02/17 254 lb 3.2 oz (115.3 kg)    Physical Exam  Constitutional: He is oriented to person, place, and time. He appears well-developed and well-nourished. No distress.  HENT:  Head: Atraumatic.  Eyes: Conjunctivae are normal. Pupils are equal, round, and reactive to  light.  Neck: Normal range of motion. Neck supple.  Cardiovascular: Normal rate and normal heart sounds.  Pulmonary/Chest: Effort normal and breath sounds normal.  Musculoskeletal: Normal range of motion.  Neurological: He is alert and oriented to person, place, and time.  Skin: Skin is warm and dry.  Psychiatric: He has a normal mood and affect. His behavior is normal.  Nursing note and vitals reviewed.  Results for orders placed or performed during the hospital encounter of 11/29/17  Urinalysis, Complete w Microscopic  Result Value Ref Range   Color, Urine AMBER (A) YELLOW   APPearance HAZY (A) CLEAR   Specific Gravity, Urine 1.021 1.005 - 1.030   pH 5.0 5.0 - 8.0   Glucose, UA NEGATIVE NEGATIVE mg/dL   Hgb urine dipstick NEGATIVE NEGATIVE   Bilirubin Urine NEGATIVE NEGATIVE   Ketones, ur 5 (A) NEGATIVE mg/dL   Protein, ur 100 (A) NEGATIVE mg/dL   Nitrite NEGATIVE NEGATIVE   Leukocytes, UA NEGATIVE NEGATIVE   RBC / HPF 0-5 0 - 5 RBC/hpf   WBC, UA 0-5 0 - 5 WBC/hpf   Bacteria, UA NONE SEEN NONE SEEN   Squamous Epithelial / LPF NONE SEEN NONE SEEN   Mucus PRESENT    Hyaline Casts, UA PRESENT   Basic metabolic panel  Result Value Ref Range   Sodium 135 135 - 145 mmol/L   Potassium 4.5 3.5 - 5.1 mmol/L   Chloride  100 (L) 101 - 111 mmol/L   CO2 27 22 - 32 mmol/L   Glucose, Bld 153 (H) 65 - 99 mg/dL   BUN 18 6 - 20 mg/dL   Creatinine, Ser 0.99 0.61 - 1.24 mg/dL   Calcium 8.9 8.9 - 10.3 mg/dL   GFR calc non Af Amer >60 >60 mL/min   GFR calc Af Amer >60 >60 mL/min   Anion gap 8 5 - 15  CBC  Result Value Ref Range   WBC 5.1 3.8 - 10.6 K/uL   RBC 4.59 4.40 - 5.90 MIL/uL   Hemoglobin 15.3 13.0 - 18.0 g/dL   HCT 45.8 40.0 - 52.0 %   MCV 99.8 80.0 - 100.0 fL   MCH 33.3 26.0 - 34.0 pg   MCHC 33.4 32.0 - 36.0 g/dL   RDW 13.6 11.5 - 14.5 %   Platelets 154 150 - 440 K/uL      Assessment & Plan:   Problem List Items Addressed This Visit      Cardiovascular and Mediastinum     Hypertension - Primary    Stable and WNL on lisinopril and amlodipine.Continue current regimen. Normal BMP 3 days ago, no need to repeat.           Follow up plan: Return in about 6 months (around 06/01/2018) for CPE, first week of June.

## 2017-12-03 ENCOUNTER — Ambulatory Visit (INDEPENDENT_AMBULATORY_CARE_PROVIDER_SITE_OTHER): Payer: Medicare Other | Admitting: Urology

## 2017-12-03 ENCOUNTER — Other Ambulatory Visit: Payer: Self-pay | Admitting: Radiology

## 2017-12-03 ENCOUNTER — Encounter: Payer: Self-pay | Admitting: Urology

## 2017-12-03 VITALS — BP 100/64 | HR 99 | Ht 71.0 in | Wt 255.0 lb

## 2017-12-03 DIAGNOSIS — N201 Calculus of ureter: Secondary | ICD-10-CM | POA: Diagnosis not present

## 2017-12-03 DIAGNOSIS — N2889 Other specified disorders of kidney and ureter: Secondary | ICD-10-CM | POA: Diagnosis not present

## 2017-12-03 DIAGNOSIS — Z8546 Personal history of malignant neoplasm of prostate: Secondary | ICD-10-CM | POA: Diagnosis not present

## 2017-12-03 DIAGNOSIS — N132 Hydronephrosis with renal and ureteral calculous obstruction: Secondary | ICD-10-CM

## 2017-12-03 DIAGNOSIS — Z87448 Personal history of other diseases of urinary system: Secondary | ICD-10-CM | POA: Diagnosis not present

## 2017-12-03 LAB — MICROSCOPIC EXAMINATION
EPITHELIAL CELLS (NON RENAL): NONE SEEN /HPF (ref 0–10)
RBC MICROSCOPIC, UA: NONE SEEN /HPF (ref 0–?)
WBC UA: NONE SEEN /HPF (ref 0–?)

## 2017-12-03 LAB — URINALYSIS, COMPLETE
Bilirubin, UA: NEGATIVE
GLUCOSE, UA: NEGATIVE
Leukocytes, UA: NEGATIVE
NITRITE UA: NEGATIVE
RBC UA: NEGATIVE
UUROB: 0.2 mg/dL (ref 0.2–1.0)
pH, UA: 5.5 (ref 5.0–7.5)

## 2017-12-03 MED ORDER — ALPROSTADIL (VASODILATOR) 500 MCG UR PLLT: 1.0000 | PELLET | URETHRAL | 6 refills | Status: DC | PRN

## 2017-12-03 MED ORDER — TAMSULOSIN HCL 0.4 MG PO CAPS: 0.4000 mg | ORAL_CAPSULE | Freq: Every day | ORAL | 0 refills | Status: DC

## 2017-12-03 NOTE — Addendum Note (Signed)
Addended by: Zara Council A on: 12/03/2017 03:11 PM   Modules accepted: Orders

## 2017-12-04 ENCOUNTER — Other Ambulatory Visit: Payer: Self-pay

## 2017-12-04 ENCOUNTER — Encounter
Admission: RE | Disposition: A | Discharge status: Home / Self Care | Payer: Self-pay | Source: Ambulatory Visit | Attending: Urology

## 2017-12-04 ENCOUNTER — Ambulatory Visit: Payer: Medicare Other

## 2017-12-04 ENCOUNTER — Ambulatory Visit
Admission: RE | Admit: 2017-12-04 | Discharge: 2017-12-04 | Disposition: A | Discharge status: Home / Self Care | Payer: Medicare Other | Source: Ambulatory Visit | Attending: Urology | Admitting: Urology

## 2017-12-04 DIAGNOSIS — N201 Calculus of ureter: Secondary | ICD-10-CM | POA: Diagnosis not present

## 2017-12-04 DIAGNOSIS — N132 Hydronephrosis with renal and ureteral calculous obstruction: Secondary | ICD-10-CM | POA: Diagnosis not present

## 2017-12-04 DIAGNOSIS — J439 Emphysema, unspecified: Secondary | ICD-10-CM | POA: Diagnosis not present

## 2017-12-04 DIAGNOSIS — F419 Anxiety disorder, unspecified: Secondary | ICD-10-CM | POA: Insufficient documentation

## 2017-12-04 DIAGNOSIS — I10 Essential (primary) hypertension: Secondary | ICD-10-CM | POA: Insufficient documentation

## 2017-12-04 DIAGNOSIS — Z8546 Personal history of malignant neoplasm of prostate: Secondary | ICD-10-CM | POA: Insufficient documentation

## 2017-12-04 DIAGNOSIS — Z79899 Other long term (current) drug therapy: Secondary | ICD-10-CM | POA: Insufficient documentation

## 2017-12-04 DIAGNOSIS — Z8249 Family history of ischemic heart disease and other diseases of the circulatory system: Secondary | ICD-10-CM | POA: Insufficient documentation

## 2017-12-04 DIAGNOSIS — F4310 Post-traumatic stress disorder, unspecified: Secondary | ICD-10-CM | POA: Diagnosis not present

## 2017-12-04 HISTORY — PX: EXTRACORPOREAL SHOCK WAVE LITHOTRIPSY: SHX1557

## 2017-12-04 LAB — PSA

## 2017-12-04 SURGERY — LITHOTRIPSY, ESWL
Anesthesia: Moderate Sedation | Laterality: Right

## 2017-12-04 MED ORDER — ONDANSETRON HCL 4 MG/2ML IJ SOLN
INTRAMUSCULAR | Status: AC
  Administered 2017-12-04: 4 mg via INTRAVENOUS
  Filled 2017-12-04: qty 2

## 2017-12-04 MED ORDER — SODIUM CHLORIDE 0.9 % IV SOLN
INTRAVENOUS | Status: DC
  Administered 2017-12-04: 08:00:00 via INTRAVENOUS

## 2017-12-04 MED ORDER — ONDANSETRON HCL 4 MG/2ML IJ SOLN
4.0000 mg | Freq: Once | INTRAMUSCULAR | Status: AC
  Administered 2017-12-04: 4 mg via INTRAVENOUS

## 2017-12-04 MED ORDER — DOCUSATE SODIUM 100 MG PO CAPS: 100.0000 mg | ORAL_CAPSULE | Freq: Two times a day (BID) | ORAL | 0 refills | Status: DC

## 2017-12-04 MED ORDER — DIAZEPAM 5 MG PO TABS
10.0000 mg | ORAL_TABLET | ORAL | Status: AC
  Administered 2017-12-04: 10 mg via ORAL

## 2017-12-04 MED ORDER — CIPROFLOXACIN HCL 500 MG PO TABS
ORAL_TABLET | ORAL | Status: AC
  Administered 2017-12-04: 500 mg via ORAL
  Filled 2017-12-04: qty 1

## 2017-12-04 MED ORDER — HYDROCODONE-ACETAMINOPHEN 5-325 MG PO TABS: 1.0000 | ORAL_TABLET | Freq: Four times a day (QID) | ORAL | 0 refills | Status: DC | PRN

## 2017-12-04 MED ORDER — DIAZEPAM 5 MG PO TABS
ORAL_TABLET | ORAL | Status: AC
  Filled 2017-12-04: qty 2

## 2017-12-04 MED ORDER — CIPROFLOXACIN HCL 500 MG PO TABS
500.0000 mg | ORAL_TABLET | ORAL | Status: AC
  Administered 2017-12-04: 500 mg via ORAL

## 2017-12-04 MED ORDER — DIPHENHYDRAMINE HCL 25 MG PO CAPS
25.0000 mg | ORAL_CAPSULE | ORAL | Status: AC
  Administered 2017-12-04: 25 mg via ORAL

## 2017-12-04 MED ORDER — DIPHENHYDRAMINE HCL 25 MG PO CAPS
ORAL_CAPSULE | ORAL | Status: AC
  Filled 2017-12-04: qty 1

## 2017-12-04 NOTE — Assessment & Plan Note (Signed)
Stable and WNL on lisinopril and amlodipine.Continue current regimen. Normal BMP 3 days ago, no need to repeat.

## 2017-12-04 NOTE — Interval H&P Note (Signed)
History and Physical Interval Note:  12/04/2017 10:32 AM  Edwin Donovan.  has presented today for surgery, with the diagnosis of Kidney stone  The various methods of treatment have been discussed with the patient and family. After consideration of risks, benefits and other options for treatment, the patient has consented to  Procedure(s): EXTRACORPOREAL SHOCK WAVE LITHOTRIPSY (ESWL) (Right) as a surgical intervention .  The patient's history has been reviewed, patient examined, no change in status, stable for surgery.  I have reviewed the patient's chart and labs.  Questions were answered to the patient's satisfaction.     Hollice Espy

## 2017-12-04 NOTE — Patient Instructions (Signed)
Follow up in 6 months 

## 2017-12-05 ENCOUNTER — Encounter: Payer: Self-pay | Admitting: Urology

## 2017-12-06 LAB — CULTURE, URINE COMPREHENSIVE

## 2017-12-09 ENCOUNTER — Ambulatory Visit: Payer: Non-veteran care | Admitting: Student in an Organized Health Care Education/Training Program

## 2017-12-23 ENCOUNTER — Encounter: Payer: Self-pay | Admitting: Urology

## 2017-12-23 ENCOUNTER — Other Ambulatory Visit: Payer: Self-pay | Admitting: Radiology

## 2017-12-23 ENCOUNTER — Inpatient Hospital Stay: Admission: RE | Admit: 2017-12-23 | Payer: Medicare Other | Source: Ambulatory Visit

## 2017-12-23 ENCOUNTER — Ambulatory Visit
Admission: RE | Admit: 2017-12-23 | Discharge: 2017-12-23 | Disposition: A | Discharge status: Home / Self Care | Payer: Medicare Other | Source: Ambulatory Visit | Attending: Urology | Admitting: Urology

## 2017-12-23 ENCOUNTER — Ambulatory Visit (INDEPENDENT_AMBULATORY_CARE_PROVIDER_SITE_OTHER): Payer: Medicare Other | Admitting: Urology

## 2017-12-23 VITALS — BP 140/79 | HR 77 | Ht 71.0 in | Wt 255.0 lb

## 2017-12-23 DIAGNOSIS — N2889 Other specified disorders of kidney and ureter: Secondary | ICD-10-CM

## 2017-12-23 DIAGNOSIS — N201 Calculus of ureter: Secondary | ICD-10-CM | POA: Diagnosis not present

## 2017-12-23 DIAGNOSIS — Z87442 Personal history of urinary calculi: Secondary | ICD-10-CM | POA: Diagnosis not present

## 2017-12-23 DIAGNOSIS — Z8546 Personal history of malignant neoplasm of prostate: Secondary | ICD-10-CM

## 2017-12-23 DIAGNOSIS — Z09 Encounter for follow-up examination after completed treatment for conditions other than malignant neoplasm: Secondary | ICD-10-CM | POA: Insufficient documentation

## 2017-12-23 DIAGNOSIS — N200 Calculus of kidney: Secondary | ICD-10-CM

## 2017-12-23 DIAGNOSIS — R109 Unspecified abdominal pain: Secondary | ICD-10-CM | POA: Diagnosis not present

## 2017-12-23 DIAGNOSIS — Z87448 Personal history of other diseases of urinary system: Secondary | ICD-10-CM

## 2017-12-23 MED ORDER — HYDROCODONE-ACETAMINOPHEN 5-325 MG PO TABS: 1.0000 | ORAL_TABLET | Freq: Four times a day (QID) | ORAL | 0 refills | Status: DC | PRN

## 2017-12-23 NOTE — H&P (View-Only) (Signed)
12/23/2017 8:06 PM   Edwin Donovan. Dec 22, 1945 [id number]  Referring provider: Volney American, PA-C [place], [place] [address]  Chief Complaint  Patient presents with  . Nephrolithiasis    2 wk post litho w/KUB    HPI: 72 year old male status post right ESWL for a 4 mm right UVJ stone.  He returns today for his 2-week follow-up.  He continues to have intermittent right flank pain, right lower quadrant pain radiating to his testicle.  This is unchanged from preop.  He is been straining his urine but not seen any stone material.  KUB today shows persistent right UVJ stone.  He does have a personal history of prostate cancer status post prostatectomy managed at the [place].  His most recent PSA next month was undetectable (11/2016).  Records from the [place] have been requested but not received.  Noncontrast CT scan from 11/29/2016, there is question of a 7 mm hypodense left upper pole renal lesion, probably hemorrhagic cyst.  This is not previously imaged on CT scan on 11/23/2017 and was indeed nonenhancing.   PMH: Past Medical History:  Diagnosis Date  . Anxiety    ptsd  . Cancer ([place])    PROSTATE  . Chronic kidney disease    stones  . COPD (chronic obstructive pulmonary disease) ([place])   . Hypertension   . Kidney stone   . Pneumonia    06/27/16  . Shortness of breath dyspnea     Surgical History: Past Surgical History:  Procedure Laterality Date  . CYSTOSCOPY W/ URETERAL STENT PLACEMENT Right 07/29/2016   Procedure: CYSTOSCOPY WITH STENT REPLACEMENT;  Surgeon: Edwin Espy, MD;  Location: [facility];  Service: Urology;  Laterality: Right;  . CYSTOSCOPY WITH BIOPSY Right 07/29/2016   Procedure: CYSTOSCOPY WITH BIOPSY;  Surgeon: Edwin Espy, MD;  Location: [facility];  Service: Urology;  Laterality: Right;  . CYSTOSCOPY WITH STENT PLACEMENT Right 06/28/2016   Procedure: CYSTOSCOPY WITH STENT PLACEMENT;  Surgeon: Edwin Espy, MD;  Location: [facility];   Service: Urology;  Laterality: Right;  . EXTRACORPOREAL SHOCK WAVE LITHOTRIPSY Right 12/04/2017   Procedure: EXTRACORPOREAL SHOCK WAVE LITHOTRIPSY (ESWL);  Surgeon: Edwin Espy, MD;  Location: [facility];  Service: Urology;  Laterality: Right;  . PROSTATECTOMY    . URETEROSCOPY Right 07/29/2016   Procedure: URETEROSCOPY;  Surgeon: Edwin Espy, MD;  Location: [facility];  Service: Urology;  Laterality: Right;    Home Medications:  Allergies as of 12/23/2017   No Known Allergies     Medication List        Accurate as of 12/23/17  8:06 PM. Always use your most recent med list.          albuterol 108 (90 Base) MCG/ACT inhaler Commonly known as:  PROVENTIL HFA;VENTOLIN HFA Inhale 1-2 puffs into the lungs every 6 (six) hours as needed for wheezing or shortness of breath.   Alprostadil (Vasodilator) 500 MCG Pllt Commonly known as:  MUSE Place 1 each into the urethra as needed.   amLODipine 10 MG tablet Commonly known as:  NORVASC Take 10 mg by mouth daily.   budesonide-formoterol 160-4.5 MCG/ACT inhaler Commonly known as:  SYMBICORT Inhale 2 puffs into the lungs 2 (two) times daily.   FLUoxetine 20 MG capsule Commonly known as:  PROZAC Take 40 mg by mouth daily.   HYDROcodone-acetaminophen 5-325 MG tablet Commonly known as:  NORCO/VICODIN Take 1-2 tablets by mouth every 6 (six) hours as needed for moderate pain.   ibuprofen  600 MG tablet Commonly known as:  ADVIL,MOTRIN Take 1 tablet (600 mg total) by mouth every 8 (eight) hours as needed.   lidocaine 5 % Commonly known as:  LIDODERM Place 2 patches onto the skin daily. Remove & Discard patch within 12 hours or as directed by MD   lisinopril 10 MG tablet Commonly known as:  PRINIVIL,ZESTRIL Take 1 tablet (10 mg total) by mouth daily.   ondansetron 4 MG tablet Commonly known as:  ZOFRAN Take 1 tablet (4 mg total) by mouth every 8 (eight) hours as needed for nausea or vomiting.   tamsulosin 0.4 MG Caps  capsule Commonly known as:  FLOMAX Take 1 capsule (0.4 mg total) by mouth daily.   traZODone 100 MG tablet Commonly known as:  DESYREL Take 100 mg by mouth at bedtime as needed for sleep.       Allergies: No Known Allergies  Family History: Family History  Problem Relation Age of Onset  . Stroke Mother   . Heart attack Father   . Lung cancer Father   . Kidney disease Father   . Prostate cancer Neg Hx   . Bladder Cancer Neg Hx     Social History:  reports that he has been smoking cigarettes.  he has never used smokeless tobacco. He reports that he drinks alcohol. He reports that he uses drugs. Drug: Marijuana.  ROS: UROLOGY Frequent Urination?: No Hard to postpone urination?: Yes Burning/pain with urination?: No Get up at night to urinate?: No Leakage of urine?: Yes Urine stream starts and stops?: No Trouble starting stream?: No Do you have to strain to urinate?: Yes Blood in urine?: No Urinary tract infection?: No Sexually transmitted disease?: No Injury to kidneys or bladder?: No Painful intercourse?: No Weak stream?: No Erection problems?: No Penile pain?: Yes  Gastrointestinal Nausea?: No Vomiting?: No Indigestion/heartburn?: No Diarrhea?: No Constipation?: No  Constitutional Fever: No Night sweats?: No Weight loss?: No Fatigue?: No  Skin Skin rash/lesions?: No Itching?: No  Eyes Blurred vision?: No Double vision?: No  Ears/Nose/Throat Sore throat?: No Sinus problems?: No  Hematologic/Lymphatic Swollen glands?: No Easy bruising?: No  Cardiovascular Leg swelling?: No Chest pain?: No  Respiratory Cough?: No Shortness of breath?: No  Endocrine Excessive thirst?: No  Musculoskeletal Back pain?: No Joint pain?: No  Neurological Headaches?: No Dizziness?: No  Psychologic Depression?: No Anxiety?: No  Physical Exam: BP 140/79   Pulse 77   Ht 5\' 11"  (1.803 m)   Wt 255 lb (115.7 kg)   BMI 35.57 kg/m   Constitutional:   Alert and oriented, No acute distress. HEENT: [place] AT, moist mucus membranes.  Trachea midline, no masses. Cardiovascular: No clubbing, cyanosis, or edema. Respiratory: Normal respiratory effort, no increased work of breathing. GI: Abdomen is soft, nontender, nondistended, no abdominal masses GU: No CVA tenderness.  Skin: No rashes, bruises or suspicious lesions. Neurologic: Grossly intact, no focal deficits, moving all 4 extremities. Psychiatric: Normal mood and affect.  Laboratory Data: Lab Results  Component Value Date   WBC 5.1 11/29/2017   HGB 15.3 11/29/2017   HCT 45.8 11/29/2017   MCV 99.8 11/29/2017   PLT 154 11/29/2017    Lab Results  Component Value Date   CREATININE 0.99 11/29/2017    Lab Results  Component Value Date   PSA1 <0.1 12/03/2017    Lab Results  Component Value Date   HGBA1C 6.0 06/28/2016    Urinalysis Results for orders placed or performed in visit on 12/03/17  CULTURE, URINE  COMPREHENSIVE  Result Value Ref Range   Urine Culture, Comprehensive Final report    Organism ID, Bacteria Comment   Microscopic Examination  Result Value Ref Range   WBC, UA None seen 0 - 5 /hpf   RBC, UA None seen 0 - 2 /hpf   Epithelial Cells (non renal) None seen 0 - 10 /hpf   Casts Present (A) None seen /lpf   Cast Type Hyaline casts N/A   Mucus, UA Present (A) Not Estab.   Bacteria, UA Few (A) None seen/Few  Urinalysis, Complete  Result Value Ref Range   Specific Gravity, UA >1.030 (H) 1.005 - 1.030   pH, UA 5.5 5.0 - 7.5   Color, UA Yellow Yellow   Appearance Ur Clear Clear   Leukocytes, UA Negative Negative   Protein, UA 2+ (A) Negative/Trace   Glucose, UA Negative Negative   Ketones, UA Trace (A) Negative   RBC, UA Negative Negative   Bilirubin, UA Negative Negative   Urobilinogen, Ur 0.2 0.2 - 1.0 mg/dL   Nitrite, UA Negative Negative   Microscopic Examination See below:   PSA  Result Value Ref Range   Prostate Specific Ag, Serum <0.1 0.0 - 4.0  ng/mL     Pertinent Imaging: CLINICAL DATA:  Right flank pain.  Recent lithotripsy  EXAM: ABDOMEN - 1 VIEW  COMPARISON:  December 04, 2017  FINDINGS: The previously noted calcification in the lower right pelvis is no longer evident. Currently, there are no appreciable abnormal calcifications. There are multiple surgical clips in the pelvis.  There is moderate stool in the colon. No bowel dilatation or air-fluid level to suggest bowel obstruction. No free air. There is lumbar dextroscoliosis. Lung bases are clear.  IMPRESSION: Previously noted distal right ureteral calculus no longer evident. Currently no abnormal calcifications are evident. Multiple surgical clips noted in pelvis. No bowel obstruction or free air appreciable.   Electronically Signed   By: Lowella Grip III M.D.   On: 12/23/2017 09:38  KUB was personally reviewed today compared to his previous KUB on 12/04/2017.  Based on my interpretation, he in fact does have a residual right distal ureteral calculus and a triangle formation.  This is unchanged from his preop.  Assessment & Plan:    1. Right ureteral stone Retained right distal ureteral stent following shockwave lithotripsy Although my interpretation of the x-ray differs from the radiologist, he does have persistent right flank pain and has not seen any stone material pass which is more consistent with retained stone Options including continued observation versus ureteroscopy were discussed He is interested in ureteroscopy. Risks and benefits of ureteroscopy were reviewed including but not limited to infection, bleeding, pain, ureteral injury which could require open surgery versus prolonged indwelling if ureteralperforation occurs, persistent stone disease, requirement for staged procedure, possible stent, and global anesthesia risks. Patient expressed understanding and desires to proceed with ureteroscopy.  2. History of hematuria History of  microscopic hematuria Likely related to stone Would recommend bilateral retrograde pyelogram and cystoscopy at the time of ureteroscopy to complete his hematuria workup as a precaution  3. History of prostate cancer Status post prostatectomy Most recent PSA undetectable Recommend annual PSA  4. Right renal mass Nonenhancing small renal mass most consistent with hemorrhagic cyst on contrasted CT scan in 11/2017 No further evaluation indicated   Schedule right ureteroscopy, laser lithotripsy, bilateral retrograde pyelogram.  Case discussed with Dr. Bernardo Heater who is kindly agreed to do the case on Friday to expedite his care.  Edwin Espy, MD  [facility] [address], [place], [place] [address] [phone]

## 2017-12-23 NOTE — Progress Notes (Signed)
12/23/2017 8:06 PM   [name] December 22, 1945 [id number]  Referring provider: Volney American, PA-C [place], [place] [address]  Chief Complaint  Patient presents with  . Nephrolithiasis    2 wk post litho w/KUB    HPI: 72 year old male status post right ESWL for a 4 mm right UVJ stone.  He returns today for his 2-week follow-up.  He continues to have intermittent right flank pain, right lower quadrant pain radiating to his testicle.  This is unchanged from preop.  He is been straining his urine but not seen any stone material.  KUB today shows persistent right UVJ stone.  He does have a personal history of prostate cancer status post prostatectomy managed at the [place].  His most recent PSA next month was undetectable (11/2016).  Records from the [place] have been requested but not received.  Noncontrast CT scan from 11/29/2016, there is question of a 7 mm hypodense left upper pole renal lesion, probably hemorrhagic cyst.  This is not previously imaged on CT scan on 11/23/2017 and was indeed nonenhancing.   PMH: Past Medical History:  Diagnosis Date  . Anxiety    ptsd  . Cancer ([place])    PROSTATE  . Chronic kidney disease    stones  . COPD (chronic obstructive pulmonary disease) ([place])   . Hypertension   . Kidney stone   . Pneumonia    06/27/16  . Shortness of breath dyspnea     Surgical History: Past Surgical History:  Procedure Laterality Date  . CYSTOSCOPY W/ URETERAL STENT PLACEMENT Right 07/29/2016   Procedure: CYSTOSCOPY WITH STENT REPLACEMENT;  Surgeon: Hollice Espy, MD;  Location: [facility];  Service: Urology;  Laterality: Right;  . CYSTOSCOPY WITH BIOPSY Right 07/29/2016   Procedure: CYSTOSCOPY WITH BIOPSY;  Surgeon: Hollice Espy, MD;  Location: [facility];  Service: Urology;  Laterality: Right;  . CYSTOSCOPY WITH STENT PLACEMENT Right 06/28/2016   Procedure: CYSTOSCOPY WITH STENT PLACEMENT;  Surgeon: Hollice Espy, MD;  Location: [facility];   Service: Urology;  Laterality: Right;  . EXTRACORPOREAL SHOCK WAVE LITHOTRIPSY Right 12/04/2017   Procedure: EXTRACORPOREAL SHOCK WAVE LITHOTRIPSY (ESWL);  Surgeon: Hollice Espy, MD;  Location: [facility];  Service: Urology;  Laterality: Right;  . PROSTATECTOMY    . URETEROSCOPY Right 07/29/2016   Procedure: URETEROSCOPY;  Surgeon: Hollice Espy, MD;  Location: [facility];  Service: Urology;  Laterality: Right;    Home Medications:  Allergies as of 12/23/2017   No Known Allergies     Medication List        Accurate as of 12/23/17  8:06 PM. Always use your most recent med list.          albuterol 108 (90 Base) MCG/ACT inhaler Commonly known as:  PROVENTIL HFA;VENTOLIN HFA Inhale 1-2 puffs into the lungs every 6 (six) hours as needed for wheezing or shortness of breath.   Alprostadil (Vasodilator) 500 MCG Pllt Commonly known as:  MUSE Place 1 each into the urethra as needed.   amLODipine 10 MG tablet Commonly known as:  NORVASC Take 10 mg by mouth daily.   budesonide-formoterol 160-4.5 MCG/ACT inhaler Commonly known as:  SYMBICORT Inhale 2 puffs into the lungs 2 (two) times daily.   FLUoxetine 20 MG capsule Commonly known as:  PROZAC Take 40 mg by mouth daily.   HYDROcodone-acetaminophen 5-325 MG tablet Commonly known as:  NORCO/VICODIN Take 1-2 tablets by mouth every 6 (six) hours as needed for moderate pain.   ibuprofen  600 MG tablet Commonly known as:  ADVIL,MOTRIN Take 1 tablet (600 mg total) by mouth every 8 (eight) hours as needed.   lidocaine 5 % Commonly known as:  LIDODERM Place 2 patches onto the skin daily. Remove & Discard patch within 12 hours or as directed by MD   lisinopril 10 MG tablet Commonly known as:  PRINIVIL,ZESTRIL Take 1 tablet (10 mg total) by mouth daily.   ondansetron 4 MG tablet Commonly known as:  ZOFRAN Take 1 tablet (4 mg total) by mouth every 8 (eight) hours as needed for nausea or vomiting.   tamsulosin 0.4 MG Caps  capsule Commonly known as:  FLOMAX Take 1 capsule (0.4 mg total) by mouth daily.   traZODone 100 MG tablet Commonly known as:  DESYREL Take 100 mg by mouth at bedtime as needed for sleep.       Allergies: No Known Allergies  Family History: Family History  Problem Relation Age of Onset  . Stroke Mother   . Heart attack Father   . Lung cancer Father   . Kidney disease Father   . Prostate cancer Neg Hx   . Bladder Cancer Neg Hx     Social History:  reports that he has been smoking cigarettes.  he has never used smokeless tobacco. He reports that he drinks alcohol. He reports that he uses drugs. Drug: Marijuana.  ROS: UROLOGY Frequent Urination?: No Hard to postpone urination?: Yes Burning/pain with urination?: No Get up at night to urinate?: No Leakage of urine?: Yes Urine stream starts and stops?: No Trouble starting stream?: No Do you have to strain to urinate?: Yes Blood in urine?: No Urinary tract infection?: No Sexually transmitted disease?: No Injury to kidneys or bladder?: No Painful intercourse?: No Weak stream?: No Erection problems?: No Penile pain?: Yes  Gastrointestinal Nausea?: No Vomiting?: No Indigestion/heartburn?: No Diarrhea?: No Constipation?: No  Constitutional Fever: No Night sweats?: No Weight loss?: No Fatigue?: No  Skin Skin rash/lesions?: No Itching?: No  Eyes Blurred vision?: No Double vision?: No  Ears/Nose/Throat Sore throat?: No Sinus problems?: No  Hematologic/Lymphatic Swollen glands?: No Easy bruising?: No  Cardiovascular Leg swelling?: No Chest pain?: No  Respiratory Cough?: No Shortness of breath?: No  Endocrine Excessive thirst?: No  Musculoskeletal Back pain?: No Joint pain?: No  Neurological Headaches?: No Dizziness?: No  Psychologic Depression?: No Anxiety?: No  Physical Exam: BP 140/79   Pulse 77   Ht 5\' 11"  (1.803 m)   Wt 255 lb (115.7 kg)   BMI 35.57 kg/m   Constitutional:   Alert and oriented, No acute distress. HEENT: [place] AT, moist mucus membranes.  Trachea midline, no masses. Cardiovascular: No clubbing, cyanosis, or edema. Respiratory: Normal respiratory effort, no increased work of breathing. GI: Abdomen is soft, nontender, nondistended, no abdominal masses GU: No CVA tenderness.  Skin: No rashes, bruises or suspicious lesions. Neurologic: Grossly intact, no focal deficits, moving all 4 extremities. Psychiatric: Normal mood and affect.  Laboratory Data: Lab Results  Component Value Date   WBC 5.1 11/29/2017   HGB 15.3 11/29/2017   HCT 45.8 11/29/2017   MCV 99.8 11/29/2017   PLT 154 11/29/2017    Lab Results  Component Value Date   CREATININE 0.99 11/29/2017    Lab Results  Component Value Date   PSA1 <0.1 12/03/2017    Lab Results  Component Value Date   HGBA1C 6.0 06/28/2016    Urinalysis Results for orders placed or performed in visit on 12/03/17  CULTURE, URINE  COMPREHENSIVE  Result Value Ref Range   Urine Culture, Comprehensive Final report    Organism ID, Bacteria Comment   Microscopic Examination  Result Value Ref Range   WBC, UA None seen 0 - 5 /hpf   RBC, UA None seen 0 - 2 /hpf   Epithelial Cells (non renal) None seen 0 - 10 /hpf   Casts Present (A) None seen /lpf   Cast Type Hyaline casts N/A   Mucus, UA Present (A) Not Estab.   Bacteria, UA Few (A) None seen/Few  Urinalysis, Complete  Result Value Ref Range   Specific Gravity, UA >1.030 (H) 1.005 - 1.030   pH, UA 5.5 5.0 - 7.5   Color, UA Yellow Yellow   Appearance Ur Clear Clear   Leukocytes, UA Negative Negative   Protein, UA 2+ (A) Negative/Trace   Glucose, UA Negative Negative   Ketones, UA Trace (A) Negative   RBC, UA Negative Negative   Bilirubin, UA Negative Negative   Urobilinogen, Ur 0.2 0.2 - 1.0 mg/dL   Nitrite, UA Negative Negative   Microscopic Examination See below:   PSA  Result Value Ref Range   Prostate Specific Ag, Serum <0.1 0.0 - 4.0  ng/mL     Pertinent Imaging: CLINICAL DATA:  Right flank pain.  Recent lithotripsy  EXAM: ABDOMEN - 1 VIEW  COMPARISON:  December 04, 2017  FINDINGS: The previously noted calcification in the lower right pelvis is no longer evident. Currently, there are no appreciable abnormal calcifications. There are multiple surgical clips in the pelvis.  There is moderate stool in the colon. No bowel dilatation or air-fluid level to suggest bowel obstruction. No free air. There is lumbar dextroscoliosis. Lung bases are clear.  IMPRESSION: Previously noted distal right ureteral calculus no longer evident. Currently no abnormal calcifications are evident. Multiple surgical clips noted in pelvis. No bowel obstruction or free air appreciable.   Electronically Signed   By: Lowella Grip III M.D.   On: 12/23/2017 09:38  KUB was personally reviewed today compared to his previous KUB on 12/04/2017.  Based on my interpretation, he in fact does have a residual right distal ureteral calculus and a triangle formation.  This is unchanged from his preop.  Assessment & Plan:    1. Right ureteral stone Retained right distal ureteral stent following shockwave lithotripsy Although my interpretation of the x-ray differs from the radiologist, he does have persistent right flank pain and has not seen any stone material pass which is more consistent with retained stone Options including continued observation versus ureteroscopy were discussed He is interested in ureteroscopy. Risks and benefits of ureteroscopy were reviewed including but not limited to infection, bleeding, pain, ureteral injury which could require open surgery versus prolonged indwelling if ureteralperforation occurs, persistent stone disease, requirement for staged procedure, possible stent, and global anesthesia risks. Patient expressed understanding and desires to proceed with ureteroscopy.  2. History of hematuria History of  microscopic hematuria Likely related to stone Would recommend bilateral retrograde pyelogram and cystoscopy at the time of ureteroscopy to complete his hematuria workup as a precaution  3. History of prostate cancer Status post prostatectomy Most recent PSA undetectable Recommend annual PSA  4. Right renal mass Nonenhancing small renal mass most consistent with hemorrhagic cyst on contrasted CT scan in 11/2017 No further evaluation indicated   Schedule right ureteroscopy, laser lithotripsy, bilateral retrograde pyelogram.  Case discussed with Dr. Bernardo Heater who is kindly agreed to do the case on Friday to expedite his care.  Hollice Espy, MD  [facility] [address], [place], [place] [address] [phone]

## 2017-12-24 ENCOUNTER — Encounter
Admission: RE | Admit: 2017-12-24 | Discharge: 2017-12-24 | Disposition: A | Discharge status: Home / Self Care | Payer: Medicare Other | Source: Ambulatory Visit | Attending: Urology | Admitting: Urology

## 2017-12-24 ENCOUNTER — Other Ambulatory Visit: Payer: Self-pay

## 2017-12-24 HISTORY — DX: Unspecified osteoarthritis, unspecified site: M19.90

## 2017-12-24 HISTORY — DX: Cardiac arrhythmia, unspecified: I49.9

## 2017-12-24 HISTORY — DX: Personal history of urinary calculi: Z87.442

## 2017-12-24 NOTE — Patient Instructions (Addendum)
Your procedure is scheduled on: 12/26/17 Fri Report to Same Day Surgery 2nd floor medical mall [facility] Entrance-take elevator on left to 2nd floor.  Check in with surgery information desk.) To find out your arrival time please call [phone] between 1PM - 3PM on 12/25/17 Thurs  Remember: Instructions that are not followed completely may result in serious medical risk, up to and including death, or upon the discretion of your surgeon and anesthesiologist your surgery may need to be rescheduled.    _x___ 1. Do not eat food after midnight the night before your procedure. You may drink clear liquids up to 2 hours before you are scheduled to arrive at the hospital for your procedure.  Do not drink clear liquids within 2 hours of your scheduled arrival to the hospital.  Clear liquids include  --Water or Apple juice without pulp  --Clear carbohydrate beverage such as ClearFast or Gatorade  --Black Coffee or Clear Tea (No milk, no creamers, do not add anything to                  the coffee or Tea Type 1 and type 2 diabetics should only drink water.  No gum chewing or hard candies.     __x__ 2. No Alcohol for 24 hours before or after surgery.   __x__3. No Smoking or e-cigarettes for 24 prior to surgery.  Do not use any chewable tobacco products for at least 6 hour prior to surgery   ____  4. Bring all medications with you on the day of surgery if instructed.    __x__ 5. Notify your doctor if there is any change in your medical condition     (cold, fever, infections).    x___6. On the morning of surgery brush your teeth with toothpaste and water.  You may rinse your mouth with mouth wash if you wish.  Do not swallow any toothpaste or mouthwash.   Do not wear jewelry, make-up, hairpins, clips or nail polish.  Do not wear lotions, powders, or perfumes. You may wear deodorant.  Do not shave 48 hours prior to surgery. Men may shave face and neck.  Do not bring valuables to the hospital.     [facility] is not responsible for any belongings or valuables.               Contacts, dentures or bridgework may not be worn into surgery.  Leave your suitcase in the car. After surgery it may be brought to your room.  For patients admitted to the hospital, discharge time is determined by your                       treatment team.  _  Patients discharged the day of surgery will not be allowed to drive home.  You will need someone to drive you home and stay with you the night of your procedure.    Please read over the following fact sheets that you were given:   [facility] Preparing for Surgery and or MRSA Information   _x___ Take anti-hypertensive listed below, cardiac, seizure, asthma,     anti-reflux and psychiatric medicines. These include:  1. FLUoxetine (PROZAC) 20 MG capsule  2.budesonide-formoterol (SYMBICORT) 160-4.5 MCG/ACT inhaler  3.albuterol (PROVENTIL HFA;VENTOLIN HFA) 108 (90 Base) MCG/ACT inhaler  4.metaproterenol (ALUPENT) 10 MG tablet  5.metoprolol succinate (TOPROL-XL) 25 MG 24 hr tablet  6.  ____Fleets enema or Magnesium Citrate as directed.   ____ Use CHG  Soap or sage wipes as directed on instruction sheet   ____ Use inhalers on the day of surgery and bring to hospital day of surgery  ____ Stop Metformin and Janumet 2 days prior to surgery.    ____ Take 1/2 of usual insulin dose the night before surgery and none on the morning     surgery.   _x___ Follow recommendations from Cardiologist, Pulmonologist or PCP regarding          stopping Aspirin, Coumadin, Plavix ,Eliquis, Effient, or Pradaxa, and Pletal.  X____Stop Anti-inflammatories such as Advil, Aleve, Ibuprofen, Motrin, Naproxen, Naprosyn, Goodies powders or aspirin products. OK to take Tylenol and                          Celebrex.   _x___ Stop supplements until after surgery.  But may continue Vitamin D, Vitamin B,       and multivitamin.   ____ Bring C-Pap to the hospital.

## 2017-12-25 ENCOUNTER — Other Ambulatory Visit: Payer: Self-pay

## 2017-12-25 ENCOUNTER — Encounter: Payer: Self-pay | Admitting: Student in an Organized Health Care Education/Training Program

## 2017-12-25 ENCOUNTER — Ambulatory Visit
Payer: Medicare Other | Attending: Student in an Organized Health Care Education/Training Program | Admitting: Student in an Organized Health Care Education/Training Program

## 2017-12-25 VITALS — BP 139/89 | HR 68 | Temp 97.6°F | Resp 16 | Ht 71.0 in | Wt 255.0 lb

## 2017-12-25 DIAGNOSIS — N189 Chronic kidney disease, unspecified: Secondary | ICD-10-CM | POA: Insufficient documentation

## 2017-12-25 DIAGNOSIS — I129 Hypertensive chronic kidney disease with stage 1 through stage 4 chronic kidney disease, or unspecified chronic kidney disease: Secondary | ICD-10-CM | POA: Insufficient documentation

## 2017-12-25 DIAGNOSIS — M25561 Pain in right knee: Secondary | ICD-10-CM

## 2017-12-25 DIAGNOSIS — F17210 Nicotine dependence, cigarettes, uncomplicated: Secondary | ICD-10-CM | POA: Diagnosis not present

## 2017-12-25 DIAGNOSIS — M47812 Spondylosis without myelopathy or radiculopathy, cervical region: Secondary | ICD-10-CM | POA: Diagnosis not present

## 2017-12-25 DIAGNOSIS — G8929 Other chronic pain: Secondary | ICD-10-CM | POA: Diagnosis not present

## 2017-12-25 DIAGNOSIS — Z79899 Other long term (current) drug therapy: Secondary | ICD-10-CM | POA: Diagnosis not present

## 2017-12-25 DIAGNOSIS — M25562 Pain in left knee: Secondary | ICD-10-CM

## 2017-12-25 DIAGNOSIS — M549 Dorsalgia, unspecified: Secondary | ICD-10-CM | POA: Diagnosis not present

## 2017-12-25 DIAGNOSIS — Z9889 Other specified postprocedural states: Secondary | ICD-10-CM | POA: Diagnosis not present

## 2017-12-25 DIAGNOSIS — M542 Cervicalgia: Secondary | ICD-10-CM | POA: Diagnosis not present

## 2017-12-25 DIAGNOSIS — J449 Chronic obstructive pulmonary disease, unspecified: Secondary | ICD-10-CM | POA: Insufficient documentation

## 2017-12-25 DIAGNOSIS — M170 Bilateral primary osteoarthritis of knee: Secondary | ICD-10-CM | POA: Insufficient documentation

## 2017-12-25 DIAGNOSIS — G894 Chronic pain syndrome: Secondary | ICD-10-CM | POA: Diagnosis not present

## 2017-12-25 DIAGNOSIS — M5136 Other intervertebral disc degeneration, lumbar region: Secondary | ICD-10-CM | POA: Diagnosis not present

## 2017-12-25 MED ORDER — CEFAZOLIN SODIUM-DEXTROSE 2-4 GM/100ML-% IV SOLN
2.0000 g | INTRAVENOUS | Status: AC
  Administered 2017-12-26: 2 g via INTRAVENOUS

## 2017-12-25 NOTE — Progress Notes (Signed)
Patient's Name: Edwin Donovan.  MRN: [id number]  Referring Provider: Volney American,*  DOB: 08/22/1946  PCP: Volney American, PA-C  DOS: 12/25/2017  Note by: Gillis Santa, MD  Service setting: Ambulatory outpatient  Specialty: Interventional Pain Management  Location: [facility] (AMB) Pain Management Facility  Visit type: Initial Patient Evaluation  Patient type: New Patient   Primary Reason(s) for Visit: Encounter for initial evaluation of one or more chronic problems (new to examiner) potentially causing chronic pain, and posing a threat to normal musculoskeletal function. (Level of risk: High) CC: Neck Pain; Knee Pain (bilaterally); and Back Pain (upper, left)  HPI  Mr. Edwin Donovan is a 72 y.o. year old, male patient, who comes today to see [place] for the first time for an initial evaluation of his chronic pain. He has Sepsis ([place]); Right ureteral stone; COPD (chronic obstructive pulmonary disease) ([place]); Hypertension; Insomnia; Anxiety; Cervicalgia; Spondylosis of cervical region without myelopathy or radiculopathy; Primary osteoarthritis of both knees; Lumbar degenerative disc disease; and Chronic pain syndrome on their problem list. Today he comes in for evaluation of his Neck Pain; Knee Pain (bilaterally); and Back Pain (upper, left)  Pain Assessment: Location: Posterior Neck Radiating: denies Onset: More than a month ago Duration: Chronic pain Quality: Constant, Shooting, Stabbing Severity: 8 /10 (self-reported pain score)  Note: Reported level is inconsistent with clinical observations.                         When using our objective Pain Scale, levels between 6 and 10/10 are said to belong in an emergency room, as it progressively worsens from a 6/10, described as severely limiting, requiring emergency care not usually available at an outpatient pain management facility. At a 6/10 level, communication becomes difficult and requires great effort. Assistance to reach the emergency  department may be required. Facial flushing and profuse sweating along with potentially dangerous increases in heart rate and blood pressure will be evident. Effect on ADL: pt states pain has slowed him down at least 50% Timing: Constant Modifying factors: Tylenol  Onset and Duration: Present longer than 3 months Cause of pain: Unknown Severity: Getting worse, NAS-11 at its worse: 10/10, NAS-11 at its best: 5/10, NAS-11 now: 8/10 and NAS-11 on the average: 7/10 Timing: Not influenced by the time of the day Aggravating Factors: Climbing, Kneeling, Lifiting, Motion, Nerve blocks, Prolonged sitting, Prolonged standing, Squatting, Stooping  and Surgery made it worse Alleviating Factors: Sleeping Associated Problems: Personality changes, Sadness, Weakness, Pain that wakes patient up and Pain that does not allow patient to sleep Quality of Pain: Aching Patient checcked the entire box of descriptions Previous Examinations or Tests: Ct-Myelogram Previous Treatments: Narcotic medications  The patient comes into the clinics today for the first time for a chronic pain management evaluation.   Very pleasant 72 year old male who was previously receiving his care at the [place] who now presents with a chief complaint of neck pain as well as bilateral knee pain.  Patient has had cortisone shots in his knees in the past which have been effective for his knee symptoms.  Patient is endorsing neck pain that does not radiate out.  He does not endorse any numbness or tingling in his upper extremities.  He does have full strength.  Patient takes Voltaren gel and Tylenol on occasion.  He has had opioid medications in the past.  He states that while they are effective he does not like to take them often and only  usually gets 10 tablets when he is having a pain flare.  Currently not on any opioid therapy.  Last fill was hydrocodone, 5 mg, quantity 10, fill date 12/23/2017  Today I took the time to provide the patient with  information regarding my pain practice. The patient was informed that my practice is divided into two sections: an interventional pain management section, as well as a completely separate and distinct medication management section. I explained that I have procedure days for my interventional therapies, and evaluation days for follow-ups and medication management. Because of the amount of documentation required during both, they are kept separated. This means that there is the possibility that he may be scheduled for a procedure on one day, and medication management the next. I have also informed him that because of staffing and facility limitations, I no longer take patients for medication management only. To illustrate the reasons for this, I gave the patient the example of surgeons, and how inappropriate it would be to refer a patient to his/her care, just to write for the post-surgical antibiotics on a surgery done by a different surgeon.   Because interventional pain management is my board-certified specialty, the patient was informed that joining my practice means that they are open to any and all interventional therapies. I made it clear that this does not mean that they will be forced to have any procedures done. What this means is that I believe interventional therapies to be essential part of the diagnosis and proper management of chronic pain conditions. Therefore, patients not interested in these interventional alternatives will be better served under the care of a different practitioner.  The patient was also made aware of my Comprehensive Pain Management Safety Guidelines where by joining my practice, they limit all of their nerve blocks and joint injections to those done by our practice, for as long as we are retained to manage their care.   Historic Controlled Substance Pharmacotherapy Review  PMP and historical list of controlled substances:  5 mg, quantity 10, fill date 12/23/2017 Medications:  The patient did not bring the medication(s) to the appointment, as requested in our "New Patient Package" Pharmacodynamics: Desired effects: Analgesia: The patient reports >50% benefit. Reported improvement in function: The patient reports medication allows him to accomplish basic ADLs. Clinically meaningful improvement in function (CMIF): Sustained CMIF goals met Perceived effectiveness: Described as relatively effective, allowing for increase in activities of daily living (ADL) Undesirable effects: Side-effects or Adverse reactions: None reported Historical Monitoring: The patient  reports that he uses drugs. Drug: Marijuana. List of all UDS Test(s): Lab Results  Component Value Date   MDMA NONE DETECTED 07/29/2016   COCAINSCRNUR NONE DETECTED 07/29/2016   PCPSCRNUR NONE DETECTED 07/29/2016   THCU POSITIVE (A) 07/29/2016   List of other Serum/Urine Drug Screening Test(s):  Lab Results  Component Value Date   COCAINSCRNUR NONE DETECTED 07/29/2016   THCU POSITIVE (A) 07/29/2016   Historical Background Evaluation: [place] PMP: Six (6) year initial data search conducted.             [place] Department of public safety, offender search: Editor, commissioning Information) Non-contributory Risk Assessment Profile: Aberrant behavior: None observed or detected today Risk factors for fatal opioid overdose: None identified today Fatal overdose hazard ratio (HR): Calculation deferred Non-fatal overdose hazard ratio (HR): Calculation deferred Risk of opioid abuse or dependence: 0.7-3.0% with doses ? 36 MME/day and 6.1-26% with doses ? 120 MME/day. Substance use disorder (SUD) risk level: Pending results of Medical  Psychology Evaluation for SUD Opioid risk tool (ORT) (Total Score): 4 Opioid Risk Tool - 12/25/17 0939      Family History of Substance Abuse   Alcohol  Positive Male    Illegal Drugs  Negative    Rx Drugs  Negative      Personal History of Substance Abuse   Alcohol  Negative    Illegal Drugs   Negative    Rx Drugs  Negative      Age   Age between 16-45 years   No      History of Preadolescent Sexual Abuse   History of Preadolescent Sexual Abuse  Negative or Male      Psychological Disease   Psychological Disease  Negative    Depression  Positive anxiety   anxiety     Total Score   Opioid Risk Tool Scoring  4    Opioid Risk Interpretation  Moderate Risk      ORT Scoring interpretation table:  Score <3 = Low Risk for SUD  Score between 4-7 = Moderate Risk for SUD  Score >8 = High Risk for Opioid Abuse   PHQ-2 Depression Scale:  Total score: 0  PHQ-2 Scoring interpretation table: (Score and probability of major depressive disorder)  Score 0 = No depression  Score 1 = 15.4% Probability  Score 2 = 21.1% Probability  Score 3 = 38.4% Probability  Score 4 = 45.5% Probability  Score 5 = 56.4% Probability  Score 6 = 78.6% Probability   PHQ-9 Depression Scale:  Total score: 0  PHQ-9 Scoring interpretation table:  Score 0-4 = No depression  Score 5-9 = Mild depression  Score 10-14 = Moderate depression  Score 15-19 = Moderately severe depression  Score 20-27 = Severe depression (2.4 times higher risk of SUD and 2.89 times higher risk of overuse)   Pharmacologic Plan: As per protocol, I have not taken over any controlled substance management, pending the results of ordered tests and/or consults.            Initial impression: Pending review of available data and ordered tests.  Meds   Current Outpatient Medications:  .  acetaminophen (TYLENOL) 500 MG tablet, Take 500 mg by mouth every 6 (six) hours as needed., Disp: , Rfl:  .  albuterol (PROVENTIL HFA;VENTOLIN HFA) 108 (90 Base) MCG/ACT inhaler, Inhale 1-2 puffs into the lungs every 6 (six) hours as needed for wheezing or shortness of breath., Disp: , Rfl:  .  budesonide-formoterol (SYMBICORT) 160-4.5 MCG/ACT inhaler, Inhale 2 puffs into the lungs 2 (two) times daily., Disp: , Rfl:  .  FLUoxetine (PROZAC) 20 MG  capsule, Take 40 mg by mouth daily., Disp: , Rfl:  .  lisinopril (PRINIVIL,ZESTRIL) 10 MG tablet, Take 1 tablet (10 mg total) by mouth daily., Disp: 30 tablet, Rfl: 0 .  metoprolol succinate (TOPROL-XL) 25 MG 24 hr tablet, Take 12.5 mg by mouth daily., Disp: , Rfl:  .  tamsulosin (FLOMAX) 0.4 MG CAPS capsule, Take 1 capsule (0.4 mg total) by mouth daily., Disp: 30 capsule, Rfl: 0 .  traZODone (DESYREL) 100 MG tablet, Take 100 mg by mouth at bedtime as needed for sleep., Disp: , Rfl:  .  Alprostadil, Vasodilator, (MUSE) 500 MCG PLLT, Place 1 each into the urethra as needed. (Patient not taking: Reported on 12/25/2017), Disp: 6 each, Rfl: 6 .  amLODipine (NORVASC) 10 MG tablet, Take 10 mg by mouth daily., Disp: , Rfl:  .  HYDROcodone-acetaminophen (NORCO/VICODIN) 5-325 MG tablet, Take 1-2  tablets by mouth every 6 (six) hours as needed for moderate pain. (Patient not taking: Reported on 12/25/2017), Disp: 10 tablet, Rfl: 0 .  ibuprofen (ADVIL,MOTRIN) 600 MG tablet, Take 1 tablet (600 mg total) by mouth every 8 (eight) hours as needed. (Patient not taking: Reported on 12/24/2017), Disp: 20 tablet, Rfl: 0 .  lidocaine (LIDODERM) 5 %, Place 2 patches onto the skin daily. Remove & Discard patch within 12 hours or as directed by MD (Patient not taking: Reported on 12/25/2017), Disp: 60 patch, Rfl: 6 .  metaproterenol (ALUPENT) 10 MG tablet, Take 10 mg by mouth 3 (three) times daily., Disp: , Rfl:  .  ondansetron (ZOFRAN) 4 MG tablet, Take 1 tablet (4 mg total) by mouth every 8 (eight) hours as needed for nausea or vomiting. (Patient not taking: Reported on 12/25/2017), Disp: 10 tablet, Rfl: 0              ROS  Cardiovascular History: Abnormal heart rhythm Pulmonary or Respiratory History: Difficulty blowing air out (Emphysema) and Smoking Neurological History: No reported neurological signs or symptoms such as seizures, abnormal skin sensations, urinary and/or fecal incontinence, being born with an abnormal open  spine and/or a tethered spinal cord Review of Past Neurological Studies: No results found for this or any previous visit. Psychological-Psychiatric History: No reported psychological or psychiatric signs or symptoms such as difficulty sleeping, anxiety, depression, delusions or hallucinations (schizophrenial), mood swings (bipolar disorders) or suicidal ideations or attempts Gastrointestinal History: No reported gastrointestinal signs or symptoms such as vomiting or evacuating blood, reflux, heartburn, alternating episodes of diarrhea and constipation, inflamed or scarred liver, or pancreas or irrregular and/or infrequent bowel movements Genitourinary History: Kidney disease Hematological History: No reported hematological signs or symptoms such as prolonged bleeding, low or poor functioning platelets, bruising or bleeding easily, hereditary bleeding problems, low energy levels due to low hemoglobin or being anemic Endocrine History: No reported endocrine signs or symptoms such as high or low blood sugar, rapid heart rate due to high thyroid levels, obesity or weight gain due to slow thyroid or thyroid disease Rheumatologic History: No reported rheumatological signs and symptoms such as fatigue, joint pain, tenderness, swelling, redness, heat, stiffness, decreased range of motion, with or without associated rash Musculoskeletal History: Negative for myasthenia gravis, muscular dystrophy, multiple sclerosis or malignant hyperthermia Work History: Retired  Allergies  Mr. Dowe has No Known Allergies.  Laboratory Chemistry  Inflammation Markers (CRP: Acute Phase) (ESR: Chronic Phase) Lab Results  Component Value Date   LATICACIDVEN 1.0 11/23/2017                 Rheumatology Markers No results found for: RF, ANA, Therisa D[place], [facility]              Renal Function Markers Lab Results  Component Value Date   BUN 18 11/29/2017   CREATININE 0.99 11/29/2017   GFRAA >60  11/29/2017   GFRNONAA >60 11/29/2017                 Hepatic Function Markers Lab Results  Component Value Date   AST 27 11/23/2017   ALT 21 11/23/2017   ALBUMIN 4.1 11/23/2017   ALKPHOS 77 11/23/2017   HCVAB 0.1 06/29/2016   LIPASE 27 11/23/2017                 Electrolytes Lab Results  Component Value Date   NA 135 11/29/2017   K 4.5 11/29/2017   CL 100 (L) 11/29/2017  CALCIUM 8.9 11/29/2017                 Neuropathy Markers Lab Results  Component Value Date   HGBA1C 6.0 06/28/2016                 Bone Pathology Markers No results found for: VD25OH, [id number], LF8101BP1, WC5852DP8, 25OHVITD1, 25OHVITD2, 25OHVITD3, TESTOFREE, TESTOSTERONE               Coagulation Parameters Lab Results  Component Value Date   PLT 154 11/29/2017                 Cardiovascular Markers Lab Results  Component Value Date   BNP 239.0 (H) 06/27/2016   TROPONINI <0.03 11/23/2017   HGB 15.3 11/29/2017   HCT 45.8 11/29/2017                 CA Markers No results found for: CEA, CA125, LABCA2               Note: Lab results reviewed.  Teller  Drug: Mr. Pascucci  reports that he uses drugs. Drug: Marijuana. Alcohol:  reports that he drinks about 8.4 oz of alcohol per week. Tobacco:  reports that he has been smoking cigarettes.  He has been smoking about 0.50 packs per day. he has never used smokeless tobacco. Medical:  has a past medical history of Anxiety, Arthritis, Cancer ([place]), Chronic kidney disease, COPD (chronic obstructive pulmonary disease) ([place]), Dysrhythmia, History of kidney stones, Hypertension, Kidney stone, Pneumonia, and Shortness of breath dyspnea. Family: family history includes Heart attack in his father; Kidney disease in his father; Lung cancer in his father; Stroke in his mother.  Past Surgical History:  Procedure Laterality Date  . CYSTOSCOPY W/ URETERAL STENT PLACEMENT Right 07/29/2016   Procedure: CYSTOSCOPY WITH STENT REPLACEMENT;  Surgeon: Hollice Espy, MD;  Location: [facility] OR[facility];  Service: Urology;  Laterality: Right;  . CYSTOSCOPY WITH BIOPSY Right 07/29/2016   Procedure: CYSTOSCOPY WITH BIOPSY;  Surgeon: Hollice Espy, MD;  Location: [facility];  Service: Urology;  Laterality: Right;  . CYSTOSCOPY WITH STENT PLACEMENT Right 06/28/2016   Procedure: CYSTOSCOPY WITH STENT PLACEMENT;  Surgeon: Hollice Espy, MD;  Location: [facility];  Service: Urology;  Laterality: Right;  . EXTRACORPOREAL SHOCK WAVE LITHOTRIPSY Right 12/04/2017   Procedure: EXTRACORPOREAL SHOCK WAVE LITHOTRIPSY (ESWL);  Surgeon: Hollice Espy, MD;  Location: [facility];  Service: Urology;  Laterality: Right;  . PROSTATECTOMY    . URETEROSCOPY Right 07/29/2016   Procedure: URETEROSCOPY;  Surgeon: Hollice Espy, MD;  Location: [facility];  Service: Urology;  Laterality: Right;   Active Ambulatory Problems    Diagnosis Date Noted  . Sepsis ([place]) 06/28/2016  . Right ureteral stone   . COPD (chronic obstructive pulmonary disease) ([place]) 11/03/2017  . Hypertension 11/03/2017  . Insomnia 11/03/2017  . Anxiety 11/03/2017  . Cervicalgia 12/25/2017  . Spondylosis of cervical region without myelopathy or radiculopathy 12/25/2017  . Primary osteoarthritis of both knees 12/25/2017  . Lumbar degenerative disc disease 12/25/2017  . Chronic pain syndrome 12/25/2017   Resolved Ambulatory Problems    Diagnosis Date Noted  . No Resolved Ambulatory Problems   Past Medical History:  Diagnosis Date  . Anxiety   . Arthritis   . Cancer ([place])   . Chronic kidney disease   . COPD (chronic obstructive pulmonary disease) ([place])   . Dysrhythmia   . History of kidney stones   . Hypertension   . Kidney stone   .  Pneumonia   . Shortness of breath dyspnea    Constitutional Exam  General appearance: Well nourished, well developed, and well hydrated. In no apparent acute distress Vitals:   12/25/17 0920  BP: 139/89  Pulse: 68  Resp: 16  Temp: 97.6 F (36.4 C)  TempSrc: Oral  SpO2:  95%  Weight: 255 lb (115.7 kg)  Height: _0  (1.803 m)   BMI Assessment: Estimated body mass index is 35.57 kg/m as calculated from the following:   Height as of this encounter: _1  (1.803 m).   Weight as of this encounter: 255 lb (115.7 kg).  BMI interpretation table: BMI level Category Range association with higher incidence of chronic pain  <18 kg/m2 Underweight   18.5-24.9 kg/m2 Ideal body weight   25-29.9 kg/m2 Overweight Increased incidence by 20%  30-34.9 kg/m2 Obese (Class I) Increased incidence by 68%  35-39.9 kg/m2 Severe obesity (Class II) Increased incidence by 136%  >40 kg/m2 Extreme obesity (Class III) Increased incidence by 254%   BMI Readings from Last 4 Encounters:  12/25/17 35.57 kg/m  12/24/17 35.57 kg/m  12/23/17 35.57 kg/m  12/04/17 35.57 kg/m   Wt Readings from Last 4 Encounters:  12/25/17 255 lb (115.7 kg)  12/24/17 255 lb (115.7 kg)  12/23/17 255 lb (115.7 kg)  12/04/17 255 lb (115.7 kg)  Psych/Mental status: Alert, oriented x 3 (person, place, & time)       Eyes: PERLA Respiratory: No evidence of acute respiratory distress  Cervical Spine Area Exam  Skin & Axial Inspection: No masses, redness, edema, swelling, or associated skin lesions Alignment: Symmetrical Functional ROM: Decreased ROM      Stability: No instability detected Muscle Tone/Strength: Functionally intact. No obvious neuro-muscular anomalies detected. Sensory (Neurological): Arthropathic arthralgia Palpation: Complains of area being tender to palpation Positive provocative maneuver for for cervical facet disease  Upper Extremity (UE) Exam    Side: Right upper extremity  Side: Left upper extremity  Skin & Extremity Inspection: Skin color, temperature, and hair growth are WNL. No peripheral edema or cyanosis. No masses, redness, swelling, asymmetry, or associated skin lesions. No contractures.  Skin & Extremity Inspection: Skin color, temperature, and hair growth are WNL. No  peripheral edema or cyanosis. No masses, redness, swelling, asymmetry, or associated skin lesions. No contractures.  Functional ROM: Unrestricted ROM          Functional ROM: Unrestricted ROM          Muscle Tone/Strength: Functionally intact. No obvious neuro-muscular anomalies detected.  Muscle Tone/Strength: Functionally intact. No obvious neuro-muscular anomalies detected.  Sensory (Neurological): Unimpaired          Sensory (Neurological): Unimpaired          Palpation: No palpable anomalies              Palpation: No palpable anomalies              Specialized Test(s): Deferred         Specialized Test(s): Deferred          Thoracic Spine Area Exam  Skin & Axial Inspection: No masses, redness, or swelling Alignment: Symmetrical Functional ROM: Unrestricted ROM Stability: No instability detected Muscle Tone/Strength: Functionally intact. No obvious neuro-muscular anomalies detected. Sensory (Neurological): Unimpaired Muscle strength & Tone: No palpable anomalies  Lumbar Spine Area Exam  Skin & Axial Inspection: No masses, redness, or swelling Alignment: Symmetrical Functional ROM: Unrestricted ROM      Stability: No instability detected Muscle Tone/Strength: Functionally intact. No obvious  neuro-muscular anomalies detected. Sensory (Neurological): Unimpaired Palpation: No palpable anomalies       Provocative Tests: Lumbar Hyperextension and rotation test: Positive bilaterally for facet joint pain. Lumbar Lateral bending test: evaluation deferred today       Patrick's Maneuver: evaluation deferred today                    Gait & Posture Assessment  Ambulation: Limited Gait: Antalgic Posture: Difficulty standing up straight, due to pain   Lower Extremity Exam    Side: Right lower extremity  Side: Left lower extremity  Skin & Extremity Inspection: Skin color, temperature, and hair growth are WNL. No peripheral edema or cyanosis. No masses, redness, swelling, asymmetry, or  associated skin lesions. No contractures.  Skin & Extremity Inspection: Skin color, temperature, and hair growth are WNL. No peripheral edema or cyanosis. No masses, redness, swelling, asymmetry, or associated skin lesions. No contractures.  Functional ROM: Unrestricted ROM          Functional ROM: Unrestricted ROM          Muscle Tone/Strength: Functionally intact. No obvious neuro-muscular anomalies detected.  Muscle Tone/Strength: Functionally intact. No obvious neuro-muscular anomalies detected.  Sensory (Neurological): Unimpaired  Sensory (Neurological): Unimpaired  Palpation: No palpable anomalies  Palpation: No palpable anomalies  5 out of 5 strength bilateral lower extremity: Plantar flexion, dorsiflexion, knee flexion, knee extension.  Assessment  Primary Diagnosis & Pertinent Problem List: The primary encounter diagnosis was Cervicalgia. Diagnoses of Spondylosis of cervical region without myelopathy or radiculopathy, Neck pain, Chronic pain of both knees, Primary osteoarthritis of both knees, Lumbar degenerative disc disease, and Chronic pain syndrome were also pertinent to this visit.  Visit Diagnosis (New problems to examiner): 1. Cervicalgia   2. Spondylosis of cervical region without myelopathy or radiculopathy   3. Neck pain   4. Chronic pain of both knees   5. Primary osteoarthritis of both knees   6. Lumbar degenerative disc disease   7. Chronic pain syndrome   General Recommendations: The pain condition that the patient suffers from is best treated with a multidisciplinary approach that involves an increase in physical activity to prevent de-conditioning and worsening of the pain cycle, as well as psychological counseling (formal and/or informal) to address the co-morbid psychological affects of pain. Treatment will often involve judicious use of pain medications and interventional procedures to decrease the pain, allowing the patient to participate in the physical activity that  will ultimately produce long-lasting pain reductions. The goal of the multidisciplinary approach is to return the patient to a higher level of overall function and to restore their ability to perform activities of daily living.  72 year old male who presents with a chief complaint of axial neck pain and bilateral knee pain.  The neck pain seems arthritic in nature.  He does not have any radiating symptoms in his upper extremities, denies any paresthesias, has full strength of his upper extremities.  The this could be secondary to cervical spondylosis, cervical degenerative disc disease.  Patient also has bilateral knee pain secondary to primary bilateral osteoarthritis of both knees.  He has previously had cortisone injections which were effective.  We consider repeating these versus bilateral genicular nerve block.  We also discussed cervical facet medial branch nerve blocks for his cervicalgia symptoms.  We also discussed the importance of weight loss and weight management and its bearing on his chronic pain management.  At this point I will obtain imaging of his cervical spine, thoracic spine,  lumbar spine, bilateral knees.  We will also complete a urine drug screen today for the patient.  Patient will follow up with me in approximately 2 weeks.  Future considerations could include intra-articular knee injections, bilateral genicular nerve block.  For his cervical pain, the future consideration could include bilateral cervical facet medial branch nerve blocks, cervical radiofrequency ablation.  Note: Please be advised that as per protocol, today's visit has been an evaluation only. We have not taken over the patient's controlled substance management.  Ordered Lab-work, Procedure(s), Referral(s), & Consult(s): Orders Placed This Encounter  Procedures  . DG Cervical Spine With Flex & Extend  . DG Knee 1-2 Views Left  . DG Knee 1-2 Views Right  . DG Lumbar Spine Complete W/Bend  . DG Thoracic Spine 2  View  . Compliance Drug Analysis, Ur    Provider-requested follow-up: Return in about 2 weeks (around 01/08/2018) for After Imaging.  Future Appointments  Date Time Provider [place]  01/08/2018  9:45 AM Gillis Santa, MD [facility] None  01/23/2018  2:15 PM Bernardo Heater, Ronda Fairly, MD [facility] None  04/15/2018  3:00 PM Volney American, PA-C [facility]    Primary Care Physician: Volney American, PA-C Location: [facility] Note by: Gillis Santa, M.D, Date: 12/25/2017; Time: 12:54 PM  Patient Instructions  ____________________________________________________________________________________________  Genicular Nerve Block  What is a genicular nerve block? A genicular nerve block is the injection of a local anesthetic to block the nerves that transmits pain from the knee.  What is the purpose of a facet nerve block? A genicular nerve block is a diagnostic procedure to determine if the pathologic changes (i.e. arthritis, meniscal tears, etc) and inflammation within the knee joint is the source of your knee pain. It also confirms that the knee pain will respond well to the actual treatment procedure. If a genicular nerve block works, it will give you relief for several hours. After that, the pain is expected to return to normal. This test is always performed twice (usually a week or two apart) because two successful tests are required to move onto treatment. If both diagnostic tests are positive, then we schedule a treatment called radiofrequency (RF) ablation. In this procedure, the same nerves are cauterized, which typically leads to pain relief for 4 -18 months. If this process works well for one knee, it can be performed on the other knee if needed.  How is the procedure performed? You will be placed on the procedure table. The injection site is sterilized with either iodine or chlorhexadine. The site to be injected is numbed with a local anesthetic,  and a needle is directed to the target area. X-ray guidance is used to ensure proper placement and positioning of the needle. When the needle is properly positioned near the genicular nerve, local anesthetic is injected to numb that nerve. This will be repeated at multiple sites around the knee to block all genicular nerves.  Will the procedure be painful? The injection can be painful and we therefore provide the option of receiving IV sedation. IV sedation, combined with local anesthetic, can make the injection nearly pain free. It allows you to remain very still during the procedure, which can also make the injection easier, faster, and more successful. If you decide to have IV sedation, you must have a driver to get you home safely afterwards. In addition, you cannot have anything to eat or drink within 8 hours of your appointment (clear liquids are allowed  until 3 hours before the procedure). If you take medications for diabetes, these medications may need to be adjusted the morning of the procedure. Your primary care physician can help you with this adjustment.  What are the discharge instructions? If you received IV sedation do not drive or operate machinery for at least 24 hours after the procedure. You may return to work the next day following your procedure. You may resume your normal diet immediately. Do not engage in any strenuous activity for 24 hours. You should, however, engage in moderate activity that typically causes your ususal pain. If the block works, those activities should not be painful for several hours after the injection. Do not take a bath, swim, or use a hot tub for 24 hours (you may take a shower). Call the office if you have any of the following: severe pain afterwards (different than your usual symptoms), redness/swelling/discharge at the injection site(s), fevers/chills, difficulty with bowel or bladder functions.  What are the risks and side effects? The complication rate  for this procedure is very low. Whenever a needle enters the skin, bleeding or infection can occur. Some other serious but extremely rare risks include paralysis and death. You may have an allergic reaction to any of the medications used. If you have a known allergy to any medications, especially local anesthetics, notify our staff before the procedure takes place. You may experience any of the following side effects up to 4 - 6 hours after the procedure: . Leg muscle weakness or numbness may occur due to the local anesthetic affecting the nerves that control your legs (this is a temporary affect and it is not paralysis). If you have any leg weakness or numbness, walk only with assistance in order to prevent falls and injury. Your leg strength will return slowly and completely. . Dizziness may occur due to a decrease in your blood pressure. If this occurs, remain in a seated or lying position. Gradually sit up, and then stand after at least 10 minutes of sitting. . Mild headaches may occur. Drink fluids and take pain medications if needed. If the headaches persist or become severe, call the office. . Mild discomfort at the injection site can occur. This typically lasts for a few hours but can persist for a couple days. If this occurs, take anti-inflammatories or pain medications, apply ice to the area the day of the procedure. If it persists, apply moist heat in the day(s) following.  The side effects listed above can be normal. They are not dangerous and will resolve on their own. If, however, you experience any of the following, a complication may have occurred and you should either contact your doctor. If he is not readily available, then you should proceed to the closest urgent care center for evaluation: . Severe or progressive pain at the injection site(s) . Arm or leg weakness that progressively worsens or persists for longer than 8 hours . Severe or progressive redness, swelling, or discharge from  the injections site(s) . Fevers, chills, nausea, or vomiting . Bowel or bladder dysfunction (i.e. inability to urinate or pass stool or difficulty controlling either)  How long does it take for the procedure to work? You should feel relief from your usual pain within the first hour. Again, this is only expected to last for several hours, at the most. Remember, you may be sore in the middle part of your back from the needles, and you must distinguish this from your usual pain. ____________________________________________________________________________________________

## 2017-12-25 NOTE — Progress Notes (Signed)
Safety precautions to be maintained throughout the outpatient stay will include: orient to surroundings, keep bed in low position, maintain call bell within reach at all times, provide assistance with transfer out of bed and ambulation.  

## 2017-12-25 NOTE — Patient Instructions (Signed)
____________________________________________________________________________________________ ° °Genicular Nerve Block ° °What is a genicular nerve block? °A genicular nerve block is the injection of a local anesthetic to block the nerves that transmits pain from the knee. ° °What is the purpose of a facet nerve block? °A genicular nerve block is a diagnostic procedure to determine if the pathologic changes (i.e. arthritis, meniscal tears, etc) and inflammation within the knee joint is the source of your knee pain. It also confirms that the knee pain will respond well to the actual treatment procedure. If a genicular nerve block works, it will give you relief for several hours. After that, the pain is expected to return to normal. This test is always performed twice (usually a week or two apart) because two successful tests are required to move onto treatment. If both diagnostic tests are positive, then we schedule a treatment called radiofrequency (RF) ablation. In this procedure, the same nerves are cauterized, which typically leads to pain relief for 4 -18 months. If this process works well for one knee, it can be performed on the other knee if needed. ° °How is the procedure performed? °You will be placed on the procedure table. The injection site is sterilized with either iodine or chlorhexadine. The site to be injected is numbed with a local anesthetic, and a needle is directed to the target area. X-ray guidance is used to ensure proper placement and positioning of the needle. When the needle is properly positioned near the genicular nerve, local anesthetic is injected to numb that nerve. This will be repeated at multiple sites around the knee to block all genicular nerves. ° °Will the procedure be painful? °The injection can be painful and we therefore provide the option of receiving IV sedation. IV sedation, combined with local anesthetic, can make the injection nearly pain free. It allows you to remain very  still during the procedure, which can also make the injection easier, faster, and more successful. If you decide to have IV sedation, you must have a driver to get you home safely afterwards. In addition, you cannot have anything to eat or drink within 8 hours of your appointment (clear liquids are allowed until 3 hours before the procedure). If you take medications for diabetes, these medications may need to be adjusted the morning of the procedure. Your primary care physician can help you with this adjustment. ° °What are the discharge instructions? °If you received IV sedation do not drive or operate machinery for at least 24 hours after the procedure. You may return to work the next day following your procedure. You may resume your normal diet immediately. Do not engage in any strenuous activity for 24 hours. You should, however, engage in moderate activity that typically causes your ususal pain. If the block works, those activities should not be painful for several hours after the injection. Do not take a bath, swim, or use a hot tub for 24 hours (you may take a shower). Call the office if you have any of the following: severe pain afterwards (different than your usual symptoms), redness/swelling/discharge at the injection site(s), fevers/chills, difficulty with bowel or bladder functions. ° °What are the risks and side effects? °The complication rate for this procedure is very low. Whenever a needle enters the skin, bleeding or infection can occur. Some other serious but extremely rare risks include paralysis and death. °You may have an allergic reaction to any of the medications used. If you have a known allergy to any medications, especially local anesthetics, notify   our staff before the procedure takes place. °You may experience any of the following side effects up to 4 - 6 hours after the procedure: °• Leg muscle weakness or numbness may occur due to the local anesthetic affecting the nerves that control  your legs (this is a temporary affect and it is not paralysis). If you have any leg weakness or numbness, walk only with assistance in order to prevent falls and injury. Your leg strength will return slowly and completely. °• Dizziness may occur due to a decrease in your blood pressure. If this occurs, remain in a seated or lying position. Gradually sit up, and then stand after at least 10 minutes of sitting. °• Mild headaches may occur. Drink fluids and take pain medications if needed. If the headaches persist or become severe, call the office. °• Mild discomfort at the injection site can occur. This typically lasts for a few hours but can persist for a couple days. If this occurs, take anti-inflammatories or pain medications, apply ice to the area the day of the procedure. If it persists, apply moist heat in the day(s) following. ° °The side effects listed above can be normal. They are not dangerous and will resolve on their own. If, however, you experience any of the following, a complication may have occurred and you should either contact your doctor. If he is not readily available, then you should proceed to the closest urgent care center for evaluation: °• Severe or progressive pain at the injection site(s) °• Arm or leg weakness that progressively worsens or persists for longer than 8 hours °• Severe or progressive redness, swelling, or discharge from the injections site(s) °• Fevers, chills, nausea, or vomiting °• Bowel or bladder dysfunction (i.e. inability to urinate or pass stool or difficulty controlling either) ° °How long does it take for the procedure to work? °You should feel relief from your usual pain within the first hour. Again, this is only expected to last for several hours, at the most. Remember, you may be sore in the middle part of your back from the needles, and you must distinguish this from your usual  pain. °____________________________________________________________________________________________ ° ° °

## 2017-12-26 ENCOUNTER — Other Ambulatory Visit: Payer: Self-pay

## 2017-12-26 ENCOUNTER — Ambulatory Visit: Payer: Medicare Other | Admitting: Certified Registered"

## 2017-12-26 ENCOUNTER — Encounter
Admission: RE | Disposition: A | Discharge status: Home / Self Care | Payer: Self-pay | Source: Ambulatory Visit | Attending: Urology

## 2017-12-26 ENCOUNTER — Ambulatory Visit
Admission: RE | Admit: 2017-12-26 | Discharge: 2017-12-26 | Disposition: A | Discharge status: Home / Self Care | Payer: Medicare Other | Source: Ambulatory Visit | Attending: Urology | Admitting: Urology

## 2017-12-26 ENCOUNTER — Encounter: Payer: Self-pay | Admitting: *Deleted

## 2017-12-26 DIAGNOSIS — J449 Chronic obstructive pulmonary disease, unspecified: Secondary | ICD-10-CM | POA: Diagnosis not present

## 2017-12-26 DIAGNOSIS — N2889 Other specified disorders of kidney and ureter: Secondary | ICD-10-CM | POA: Diagnosis not present

## 2017-12-26 DIAGNOSIS — F4310 Post-traumatic stress disorder, unspecified: Secondary | ICD-10-CM | POA: Diagnosis not present

## 2017-12-26 DIAGNOSIS — F17210 Nicotine dependence, cigarettes, uncomplicated: Secondary | ICD-10-CM | POA: Insufficient documentation

## 2017-12-26 DIAGNOSIS — N201 Calculus of ureter: Secondary | ICD-10-CM | POA: Diagnosis not present

## 2017-12-26 DIAGNOSIS — M1990 Unspecified osteoarthritis, unspecified site: Secondary | ICD-10-CM | POA: Insufficient documentation

## 2017-12-26 DIAGNOSIS — N202 Calculus of kidney with calculus of ureter: Secondary | ICD-10-CM | POA: Diagnosis not present

## 2017-12-26 DIAGNOSIS — N200 Calculus of kidney: Secondary | ICD-10-CM | POA: Diagnosis not present

## 2017-12-26 DIAGNOSIS — Z9079 Acquired absence of other genital organ(s): Secondary | ICD-10-CM | POA: Insufficient documentation

## 2017-12-26 DIAGNOSIS — Z8249 Family history of ischemic heart disease and other diseases of the circulatory system: Secondary | ICD-10-CM | POA: Diagnosis not present

## 2017-12-26 DIAGNOSIS — Z8546 Personal history of malignant neoplasm of prostate: Secondary | ICD-10-CM | POA: Insufficient documentation

## 2017-12-26 DIAGNOSIS — F419 Anxiety disorder, unspecified: Secondary | ICD-10-CM | POA: Insufficient documentation

## 2017-12-26 DIAGNOSIS — Z79899 Other long term (current) drug therapy: Secondary | ICD-10-CM | POA: Diagnosis not present

## 2017-12-26 DIAGNOSIS — I129 Hypertensive chronic kidney disease with stage 1 through stage 4 chronic kidney disease, or unspecified chronic kidney disease: Secondary | ICD-10-CM | POA: Diagnosis not present

## 2017-12-26 DIAGNOSIS — I10 Essential (primary) hypertension: Secondary | ICD-10-CM | POA: Insufficient documentation

## 2017-12-26 DIAGNOSIS — N189 Chronic kidney disease, unspecified: Secondary | ICD-10-CM | POA: Diagnosis not present

## 2017-12-26 HISTORY — PX: CYSTOSCOPY W/ RETROGRADES: SHX1426

## 2017-12-26 HISTORY — PX: CYSTOSCOPY/URETEROSCOPY/HOLMIUM LASER/STENT PLACEMENT: SHX6546

## 2017-12-26 SURGERY — CYSTOSCOPY/URETEROSCOPY/HOLMIUM LASER/STENT PLACEMENT
Anesthesia: General | Site: Ureter | Laterality: Right | Wound class: Clean Contaminated

## 2017-12-26 MED ORDER — GLYCOPYRROLATE 0.2 MG/ML IJ SOLN
INTRAMUSCULAR | Status: DC | PRN
  Administered 2017-12-26: 0.2 mg via INTRAVENOUS

## 2017-12-26 MED ORDER — FENTANYL CITRATE (PF) 250 MCG/5ML IJ SOLN
INTRAMUSCULAR | Status: AC
  Filled 2017-12-26: qty 5

## 2017-12-26 MED ORDER — DEXAMETHASONE SODIUM PHOSPHATE 10 MG/ML IJ SOLN
INTRAMUSCULAR | Status: DC | PRN
  Administered 2017-12-26: 5 mg via INTRAVENOUS

## 2017-12-26 MED ORDER — FENTANYL CITRATE (PF) 100 MCG/2ML IJ SOLN: 25.0000 ug | INTRAMUSCULAR | Status: DC | PRN

## 2017-12-26 MED ORDER — FENTANYL CITRATE (PF) 100 MCG/2ML IJ SOLN
INTRAMUSCULAR | Status: DC | PRN
  Administered 2017-12-26 (×3): 50 ug via INTRAVENOUS

## 2017-12-26 MED ORDER — LIDOCAINE HCL (CARDIAC) 20 MG/ML IV SOLN
INTRAVENOUS | Status: DC | PRN
  Administered 2017-12-26: 50 mg via INTRAVENOUS

## 2017-12-26 MED ORDER — SUGAMMADEX SODIUM 200 MG/2ML IV SOLN
INTRAVENOUS | Status: DC | PRN
  Administered 2017-12-26: 250 mg via INTRAVENOUS

## 2017-12-26 MED ORDER — OXYCODONE HCL 5 MG PO TABS
5.0000 mg | ORAL_TABLET | Freq: Once | ORAL | Status: AC | PRN
  Administered 2017-12-26: 5 mg via ORAL

## 2017-12-26 MED ORDER — ONDANSETRON HCL 4 MG/2ML IJ SOLN
INTRAMUSCULAR | Status: AC
  Filled 2017-12-26: qty 2

## 2017-12-26 MED ORDER — SUCCINYLCHOLINE CHLORIDE 20 MG/ML IJ SOLN
INTRAMUSCULAR | Status: AC
  Filled 2017-12-26: qty 1

## 2017-12-26 MED ORDER — EPHEDRINE SULFATE 50 MG/ML IJ SOLN
INTRAMUSCULAR | Status: AC
  Filled 2017-12-26: qty 1

## 2017-12-26 MED ORDER — SUGAMMADEX SODIUM 500 MG/5ML IV SOLN
INTRAVENOUS | Status: AC
  Filled 2017-12-26: qty 5

## 2017-12-26 MED ORDER — ROCURONIUM BROMIDE 100 MG/10ML IV SOLN
INTRAVENOUS | Status: DC | PRN
  Administered 2017-12-26: 30 mg via INTRAVENOUS

## 2017-12-26 MED ORDER — PROPOFOL 10 MG/ML IV BOLUS
INTRAVENOUS | Status: DC | PRN
  Administered 2017-12-26: 200 mg via INTRAVENOUS

## 2017-12-26 MED ORDER — OXYCODONE HCL 5 MG PO TABS
ORAL_TABLET | ORAL | Status: AC
  Filled 2017-12-26: qty 1

## 2017-12-26 MED ORDER — ROCURONIUM BROMIDE 50 MG/5ML IV SOLN
INTRAVENOUS | Status: AC
  Filled 2017-12-26: qty 1

## 2017-12-26 MED ORDER — OXYCODONE HCL 5 MG/5ML PO SOLN
5.0000 mg | Freq: Once | ORAL | Status: AC | PRN
Start: 2017-12-26 — End: 2017-12-26

## 2017-12-26 MED ORDER — GLYCOPYRROLATE 0.2 MG/ML IJ SOLN
INTRAMUSCULAR | Status: AC
  Filled 2017-12-26: qty 1

## 2017-12-26 MED ORDER — SUCCINYLCHOLINE CHLORIDE 20 MG/ML IJ SOLN
INTRAMUSCULAR | Status: DC | PRN
  Administered 2017-12-26: 100 mg via INTRAVENOUS

## 2017-12-26 MED ORDER — OXYBUTYNIN CHLORIDE 5 MG PO TABS: ORAL_TABLET | ORAL | 0 refills | Status: DC

## 2017-12-26 MED ORDER — EPHEDRINE SULFATE 50 MG/ML IJ SOLN
INTRAMUSCULAR | Status: DC | PRN
  Administered 2017-12-26 (×4): 10 mg via INTRAVENOUS

## 2017-12-26 MED ORDER — LACTATED RINGERS IV SOLN
INTRAVENOUS | Status: DC
  Administered 2017-12-26: 09:00:00 via INTRAVENOUS

## 2017-12-26 MED ORDER — PHENYLEPHRINE HCL 10 MG/ML IJ SOLN
INTRAMUSCULAR | Status: DC | PRN
  Administered 2017-12-26 (×2): 200 ug via INTRAVENOUS
  Administered 2017-12-26: 100 ug via INTRAVENOUS
  Administered 2017-12-26: 200 ug via INTRAVENOUS
  Administered 2017-12-26: 100 ug via INTRAVENOUS
  Administered 2017-12-26: 200 ug via INTRAVENOUS

## 2017-12-26 MED ORDER — CEFAZOLIN SODIUM-DEXTROSE 2-4 GM/100ML-% IV SOLN
INTRAVENOUS | Status: AC
  Filled 2017-12-26: qty 100

## 2017-12-26 MED ORDER — HYDROCODONE-ACETAMINOPHEN 5-325 MG PO TABS
1.0000 | ORAL_TABLET | Freq: Four times a day (QID) | ORAL | 0 refills | Status: DC | PRN
Start: 2017-12-26 — End: 2018-02-09

## 2017-12-26 MED ORDER — PROPOFOL 10 MG/ML IV BOLUS
INTRAVENOUS | Status: AC
  Filled 2017-12-26: qty 20

## 2017-12-26 MED ORDER — FAMOTIDINE 20 MG PO TABS
ORAL_TABLET | ORAL | Status: AC
  Administered 2017-12-26: 20 mg
  Filled 2017-12-26: qty 1

## 2017-12-26 MED ORDER — FAMOTIDINE 20 MG PO TABS: 20.0000 mg | ORAL_TABLET | Freq: Once | ORAL | Status: DC

## 2017-12-26 MED ORDER — ONDANSETRON HCL 4 MG/2ML IJ SOLN
INTRAMUSCULAR | Status: DC | PRN
  Administered 2017-12-26: 4 mg via INTRAVENOUS

## 2017-12-26 MED ORDER — LIDOCAINE HCL (PF) 2 % IJ SOLN
INTRAMUSCULAR | Status: AC
  Filled 2017-12-26: qty 10

## 2017-12-26 SURGICAL SUPPLY — 30 items
BAG DRAIN CYSTO-URO LG1000N (MISCELLANEOUS) ×4 IMPLANT
BASKET ZERO TIP 1.9FR (BASKET) IMPLANT
BRUSH SCRUB EZ  4% CHG (MISCELLANEOUS) ×2
BRUSH SCRUB EZ 1% IODOPHOR (MISCELLANEOUS) ×4 IMPLANT
BRUSH SCRUB EZ 4% CHG (MISCELLANEOUS) ×2 IMPLANT
CATH URETL 5X70 OPEN END (CATHETERS) ×4 IMPLANT
CNTNR SPEC 2.5X3XGRAD LEK (MISCELLANEOUS)
CONRAY 43 FOR UROLOGY 50M (MISCELLANEOUS) ×4 IMPLANT
CONT SPEC 4OZ STER OR WHT (MISCELLANEOUS)
CONTAINER SPEC 2.5X3XGRAD LEK (MISCELLANEOUS) IMPLANT
DRAPE UTILITY 15X26 TOWEL STRL (DRAPES) ×4 IMPLANT
FIBER LASER LITHO 273 (Laser) ×4 IMPLANT
GLOVE BIO SURGEON STRL SZ8 (GLOVE) ×4 IMPLANT
GOWN STRL REUS W/ TWL LRG LVL3 (GOWN DISPOSABLE) ×4 IMPLANT
GOWN STRL REUS W/TWL LRG LVL3 (GOWN DISPOSABLE) ×4
GUIDEWIRE GREEN .038 145CM (MISCELLANEOUS) IMPLANT
INFUSOR MANOMETER BAG 3000ML (MISCELLANEOUS) ×4 IMPLANT
INTRODUCER DILATOR DOUBLE (INTRODUCER) IMPLANT
KIT TURNOVER CYSTO (KITS) ×4 IMPLANT
PACK CYSTO AR (MISCELLANEOUS) ×4 IMPLANT
SENSORWIRE 0.038 NOT ANGLED (WIRE) ×8
SET CYSTO W/LG BORE CLAMP LF (SET/KITS/TRAYS/PACK) ×4 IMPLANT
SHEATH URETERAL 12FRX35CM (MISCELLANEOUS) IMPLANT
SOL .9 NS 3000ML IRR  AL (IV SOLUTION) ×2
SOL .9 NS 3000ML IRR UROMATIC (IV SOLUTION) ×2 IMPLANT
STENT URET 6FRX24 CONTOUR (STENTS) IMPLANT
STENT URET 6FRX26 CONTOUR (STENTS) ×4 IMPLANT
SURGILUBE 2OZ TUBE FLIPTOP (MISCELLANEOUS) ×4 IMPLANT
WATER STERILE IRR 1000ML POUR (IV SOLUTION) ×4 IMPLANT
WIRE SENSOR 0.038 NOT ANGLED (WIRE) ×4 IMPLANT

## 2017-12-26 NOTE — Anesthesia Post-op Follow-up Note (Signed)
Anesthesia QCDR form completed.        

## 2017-12-26 NOTE — Op Note (Signed)
Preoperative diagnosis: Right distal ureteral calculus  Postoperative diagnosis: Right distal ureteral calculus  Procedure:  1. Cystoscopy 2. Right ureteroscopy and stone removal 3. Ureteroscopic laser lithotripsy 4. Right ureteral stent placement  5. Bilateral retrograde pyelography with interpretation  Surgeon: Nicki Reaper C. Stoioff, M.D.  Anesthesia: General  Complications: None  Intraoperative findings:  1.  Left retrograde pyelogram remarkable for normal caliber ureter and no filling defects within the renal pelvis.  Collecting system normal in appearance. Right retrograde pyelogram post stone removal shows no contrast extravasation and a normal caliber ureter.  Mild fullness to the right collecting system with anterior rotation noted.  EBL: Minimal  Specimens: 1. Calculus fragments for analysis   Indication: Edwin Donovan. is a 72 y.o. year old patient with a right ureteral calculus without significant fragmentation noted after shockwave lithotripsy. After reviewing the management options for treatment, the patient elected to proceed with the above surgical procedure(s). We have discussed the potential benefits and risks of the procedure, side effects of the proposed treatment, the likelihood of the patient achieving the goals of the procedure, and any potential problems that might occur during the procedure or recuperation. Informed consent has been obtained.  Description of procedure:  The patient was taken to the operating room and general anesthesia was induced.  The patient was placed in the dorsal lithotomy position, prepped and draped in the usual sterile fashion, and preoperative antibiotics were administered. A preoperative time-out was performed.   A 22 French cystoscope was lubricated and passed under direct vision.  The urethra was normal in caliber without stricture.  The prostate was surgically absent.  Panendoscopy was performed and the bladder mucosa showed  no erythema, solid or papillary lesions.  The ureteral orifices were normal appearing bilaterally with clear efflux  Attention then turned to the left ureteral orifice and a ureteral catheter was used to intubate the ureteral orifice.  Omnipaque contrast was injected through the ureteral catheter and a retrograde pyelogram was performed with findings as dictated above.  Attention was directed to the right ureteral orifice and a 0.038 Sensor wire was then advanced up the right ureter into the renal pelvis under fluoroscopic guidance.  A 4.5 Fr semirigid ureteroscope was then advanced into the ureter next to the guidewire and the calculus was identified in the distal ureter.   The stone was then fragmented with the 273 micron holmium laser fiber on a setting of 0.8 J and frequency of 8 hz.   All stone fragments were then removed from the ureter with a zero tip nitinol basket.  Reinspection of the ureter revealed no remaining visible stones or fragments.   A pull out right retrograde pyelogram through the ureteroscope was then performed with findings as described above.  The guidewire was replaced without difficulty.  A 6 FR/ 26 cm stent with string was placed under fluoroscopic guidance.  The wire was then removed with an adequate stent curl noted in the renal pelvis as well as in the bladder.  The cystoscope was repassed and the stone fragments were removed with irrigation/evacuation.  The bladder was then emptied and the procedure ended.  The patient appeared to tolerate the procedure well and without complications.  After anesthetic reversal the patient was transported to the PACU in stable condition.

## 2017-12-26 NOTE — Anesthesia Procedure Notes (Signed)
Procedure Name: Intubation Performed by: Rolla Plate, CRNA Pre-anesthesia Checklist: Patient identified, Patient being monitored, Timeout performed, Emergency Drugs available and Suction available Patient Re-evaluated:Patient Re-evaluated prior to induction Oxygen Delivery Method: Circle system utilized Preoxygenation: Pre-oxygenation with 100% oxygen Induction Type: IV induction and Rapid sequence Laryngoscope Size: 3, Miller and 2 Grade View: Grade I Tube type: Oral Tube size: 7.5 mm Number of attempts: 1 Airway Equipment and Method: Stylet Placement Confirmation: ETT inserted through vocal cords under direct vision,  positive ETCO2 and breath sounds checked- equal and bilateral Secured at: 23 cm Tube secured with: Tape Dental Injury: Teeth and Oropharynx as per pre-operative assessment

## 2017-12-26 NOTE — Transfer of Care (Signed)
Immediate Anesthesia Transfer of Care Note  Patient: Edwin Donovan.  Procedure(s) Performed: CYSTOSCOPY/URETEROSCOPY/HOLMIUM LASER/STENT PLACEMENT (Right Ureter) CYSTOSCOPY WITH RETROGRADE PYELOGRAM (Bilateral Bladder)  Patient Location: PACU  Anesthesia Type:General  Level of Consciousness: awake and alert   Airway & Oxygen Therapy: Patient Spontanous Breathing and Patient connected to face mask oxygen  Post-op Assessment: Report given to RN and Post -op Vital signs reviewed and stable  Post vital signs: Reviewed  Last Vitals:  Vitals:   12/26/17 1027 12/26/17 1028  BP: (!) 155/86 (!) 155/86  Pulse: 87 81  Resp: 13 15  Temp: (!) 36.3 C   SpO2: 100% 99%    Last Pain:  Vitals:   12/26/17 0821  TempSrc: Oral  PainSc: 6       Patients Stated Pain Goal: 6 (28/78/67 6720)  Complications: No apparent anesthesia complications

## 2017-12-26 NOTE — Discharge Instructions (Signed)

## 2017-12-26 NOTE — Interval H&P Note (Signed)
History and Physical Interval Note:  12/26/2017 8:38 AM  Edwin Oh.  has presented today for surgery, with the diagnosis of right nephrolithiasis  The various methods of treatment have been discussed with the patient and family. After consideration of risks, benefits and other options for treatment, the patient has consented to  Procedure(s): CYSTOSCOPY/URETEROSCOPY/HOLMIUM LASER/STENT PLACEMENT (Right) CYSTOSCOPY WITH RETROGRADE PYELOGRAM (Bilateral) as a surgical intervention .  The patient's history has been reviewed, patient examined, no change in status, stable for surgery.  I have reviewed the patient's chart and labs.  Questions were answered to the patient's satisfaction.    CV RRR Lungs clear   Edwin Donovan

## 2017-12-26 NOTE — Anesthesia Preprocedure Evaluation (Addendum)
Anesthesia Evaluation  Patient identified by MRN, date of birth, ID band Patient awake    Reviewed: Allergy & Precautions, H&P , NPO status , Patient's Chart, lab work & pertinent test results  History of Anesthesia Complications Negative for: history of anesthetic complications  Airway Mallampati: III  TM Distance: >3 FB Neck ROM: full    Dental  (+) Chipped, Poor Dentition, Missing   Pulmonary shortness of breath and with exertion, pneumonia, COPD, Current Smoker,           Cardiovascular Exercise Tolerance: Good hypertension, (-) angina(-) Past MI and (-) DOE + dysrhythmias      Neuro/Psych Anxiety negative neurological ROS  negative psych ROS   GI/Hepatic negative GI ROS, Neg liver ROS, neg GERD  ,  Endo/Other  negative endocrine ROS  Renal/GU Renal disease     Musculoskeletal  (+) Arthritis ,   Abdominal   Peds  Hematology negative hematology ROS (+)   Anesthesia Other Findings Past Medical History: No date: Anxiety     Comment:  ptsd No date: Arthritis No date: Cancer ([place])     Comment:  PROSTATE No date: Chronic kidney disease     Comment:  stones No date: COPD (chronic obstructive pulmonary disease) ([facility]) No date: Dysrhythmia No date: History of kidney stones No date: Hypertension No date: Kidney stone No date: Pneumonia     Comment:  06/27/16 No date: Shortness of breath dyspnea  Past Surgical History: 07/29/2016: CYSTOSCOPY W/ URETERAL STENT PLACEMENT; Right     Comment:  Procedure: CYSTOSCOPY WITH STENT REPLACEMENT;  Surgeon:               Hollice Espy, MD;  Location: [facility];  Service:               Urology;  Laterality: Right; 07/29/2016: CYSTOSCOPY WITH BIOPSY; Right     Comment:  Procedure: CYSTOSCOPY WITH BIOPSY;  Surgeon: Hollice Espy, MD;  Location: [facility];  Service: Urology;                Laterality: Right; 06/28/2016: CYSTOSCOPY WITH STENT PLACEMENT; Right  Comment:  Procedure: CYSTOSCOPY WITH STENT PLACEMENT;  Surgeon:               Hollice Espy, MD;  Location: [facility];  Service:               Urology;  Laterality: Right; 12/04/2017: EXTRACORPOREAL SHOCK WAVE LITHOTRIPSY; Right     Comment:  Procedure: EXTRACORPOREAL SHOCK WAVE LITHOTRIPSY (ESWL);              Surgeon: Hollice Espy, MD;  Location: [facility];                Service: Urology;  Laterality: Right; No date: PROSTATECTOMY 07/29/2016: URETEROSCOPY; Right     Comment:  Procedure: URETEROSCOPY;  Surgeon: Hollice Espy, MD;                Location: [facility];  Service: Urology;  Laterality:               Right;  BMI    Body Mass Index:  35.57 kg/m      Reproductive/Obstetrics negative OB ROS                            Anesthesia Physical Anesthesia Plan  ASA: III  Anesthesia Plan: General  ETT   Post-op Pain Management:    Induction: Intravenous  PONV Risk Score and Plan: 3 and Ondansetron, Dexamethasone, Midazolam and Treatment may vary due to age or medical condition  Airway Management Planned: Oral ETT  Additional Equipment:   Intra-op Plan:   Post-operative Plan: Extubation in OR  Informed Consent: I have reviewed the patients History and Physical, chart, labs and discussed the procedure including the risks, benefits and alternatives for the proposed anesthesia with the patient or authorized representative who has indicated his/her understanding and acceptance.   Dental Advisory Given  Plan Discussed with: Anesthesiologist, CRNA and Surgeon  Anesthesia Plan Comments: (Patient consented for risks of anesthesia including but not limited to:  - adverse reactions to medications - damage to teeth, lips or other oral mucosa - sore throat or hoarseness - Damage to heart, brain, lungs or loss of life  Patient voiced understanding.)        Anesthesia Quick Evaluation

## 2017-12-26 NOTE — Anesthesia Postprocedure Evaluation (Signed)
Anesthesia Post Note  Patient: Edwin Donovan.  Procedure(s) Performed: CYSTOSCOPY/URETEROSCOPY/HOLMIUM LASER/STENT PLACEMENT (Right Ureter) CYSTOSCOPY WITH RETROGRADE PYELOGRAM (Bilateral Bladder)  Patient location during evaluation: PACU Anesthesia Type: General Level of consciousness: awake and alert Pain management: pain level controlled Vital Signs Assessment: post-procedure vital signs reviewed and stable Respiratory status: spontaneous breathing, nonlabored ventilation, respiratory function stable and patient connected to nasal cannula oxygen Cardiovascular status: blood pressure returned to baseline and stable Postop Assessment: no apparent nausea or vomiting Anesthetic complications: no     Last Vitals:  Vitals:   12/26/17 1115 12/26/17 1140  BP: 125/76 131/74  Pulse: 78 77  Resp: 17 16  Temp: 36.4 C   SpO2: 97% 96%    Last Pain:  Vitals:   12/26/17 1115  TempSrc:   PainSc: 2                  Precious Haws [name]

## 2017-12-29 ENCOUNTER — Telehealth: Payer: Self-pay

## 2017-12-29 NOTE — Telephone Encounter (Signed)
Pt called the after hours triage line over the weekend stating the stent he had placed on 12/26/17 came out 2-3 inches. Dr. Lovena Neighbours advised pt to go ahead and pull stent.   Called to check on pt. Pt stated that he has had some slight dysuria but is getting better. Reinforced with pt to increase fluid in take. Pt voiced understanding.

## 2017-12-30 LAB — STONE ANALYSIS
CA OXALATE, DIHYDRATE: 5 %
CA OXALATE, MONOHYDR.: 75 %
CA PHOS CRY STONE QL IR: 20 %
Stone Weight KSTONE: 46 mg

## 2018-01-01 LAB — COMPLIANCE DRUG ANALYSIS, UR

## 2018-01-08 ENCOUNTER — Ambulatory Visit
Admission: RE | Admit: 2018-01-08 | Discharge: 2018-01-08 | Disposition: A | Discharge status: Home / Self Care | Payer: Medicare Other | Source: Ambulatory Visit | Attending: Urology | Admitting: Urology

## 2018-01-08 ENCOUNTER — Ambulatory Visit
Payer: Medicare Other | Attending: Student in an Organized Health Care Education/Training Program | Admitting: Student in an Organized Health Care Education/Training Program

## 2018-01-08 ENCOUNTER — Ambulatory Visit
Admission: RE | Admit: 2018-01-08 | Discharge: 2018-01-08 | Disposition: A | Discharge status: Home / Self Care | Payer: Medicare Other | Source: Ambulatory Visit | Attending: Student in an Organized Health Care Education/Training Program | Admitting: Student in an Organized Health Care Education/Training Program

## 2018-01-08 ENCOUNTER — Other Ambulatory Visit: Payer: Self-pay

## 2018-01-08 ENCOUNTER — Encounter: Payer: Self-pay | Admitting: Student in an Organized Health Care Education/Training Program

## 2018-01-08 VITALS — BP 165/91 | HR 60 | Temp 97.8°F | Resp 16 | Ht 71.0 in | Wt 255.0 lb

## 2018-01-08 DIAGNOSIS — Z79899 Other long term (current) drug therapy: Secondary | ICD-10-CM | POA: Diagnosis not present

## 2018-01-08 DIAGNOSIS — F1290 Cannabis use, unspecified, uncomplicated: Secondary | ICD-10-CM | POA: Diagnosis not present

## 2018-01-08 DIAGNOSIS — M4186 Other forms of scoliosis, lumbar region: Secondary | ICD-10-CM | POA: Insufficient documentation

## 2018-01-08 DIAGNOSIS — M179 Osteoarthritis of knee, unspecified: Secondary | ICD-10-CM | POA: Diagnosis not present

## 2018-01-08 DIAGNOSIS — N189 Chronic kidney disease, unspecified: Secondary | ICD-10-CM | POA: Insufficient documentation

## 2018-01-08 DIAGNOSIS — M47812 Spondylosis without myelopathy or radiculopathy, cervical region: Secondary | ICD-10-CM | POA: Diagnosis not present

## 2018-01-08 DIAGNOSIS — M5134 Other intervertebral disc degeneration, thoracic region: Secondary | ICD-10-CM | POA: Insufficient documentation

## 2018-01-08 DIAGNOSIS — G8929 Other chronic pain: Secondary | ICD-10-CM

## 2018-01-08 DIAGNOSIS — M5136 Other intervertebral disc degeneration, lumbar region: Secondary | ICD-10-CM | POA: Insufficient documentation

## 2018-01-08 DIAGNOSIS — M25562 Pain in left knee: Secondary | ICD-10-CM

## 2018-01-08 DIAGNOSIS — M25561 Pain in right knee: Secondary | ICD-10-CM | POA: Insufficient documentation

## 2018-01-08 DIAGNOSIS — N211 Calculus in urethra: Secondary | ICD-10-CM | POA: Insufficient documentation

## 2018-01-08 DIAGNOSIS — M170 Bilateral primary osteoarthritis of knee: Secondary | ICD-10-CM | POA: Diagnosis not present

## 2018-01-08 DIAGNOSIS — M4184 Other forms of scoliosis, thoracic region: Secondary | ICD-10-CM | POA: Insufficient documentation

## 2018-01-08 DIAGNOSIS — I6529 Occlusion and stenosis of unspecified carotid artery: Secondary | ICD-10-CM | POA: Insufficient documentation

## 2018-01-08 DIAGNOSIS — M542 Cervicalgia: Secondary | ICD-10-CM | POA: Diagnosis not present

## 2018-01-08 DIAGNOSIS — M4316 Spondylolisthesis, lumbar region: Secondary | ICD-10-CM | POA: Diagnosis not present

## 2018-01-08 DIAGNOSIS — N200 Calculus of kidney: Secondary | ICD-10-CM | POA: Insufficient documentation

## 2018-01-08 DIAGNOSIS — F17210 Nicotine dependence, cigarettes, uncomplicated: Secondary | ICD-10-CM | POA: Diagnosis not present

## 2018-01-08 DIAGNOSIS — Z7951 Long term (current) use of inhaled steroids: Secondary | ICD-10-CM | POA: Insufficient documentation

## 2018-01-08 DIAGNOSIS — M47817 Spondylosis without myelopathy or radiculopathy, lumbosacral region: Secondary | ICD-10-CM | POA: Diagnosis not present

## 2018-01-08 DIAGNOSIS — J449 Chronic obstructive pulmonary disease, unspecified: Secondary | ICD-10-CM | POA: Insufficient documentation

## 2018-01-08 DIAGNOSIS — I129 Hypertensive chronic kidney disease with stage 1 through stage 4 chronic kidney disease, or unspecified chronic kidney disease: Secondary | ICD-10-CM | POA: Diagnosis not present

## 2018-01-08 DIAGNOSIS — G894 Chronic pain syndrome: Secondary | ICD-10-CM | POA: Insufficient documentation

## 2018-01-08 DIAGNOSIS — F419 Anxiety disorder, unspecified: Secondary | ICD-10-CM | POA: Diagnosis not present

## 2018-01-08 DIAGNOSIS — G4700 Insomnia, unspecified: Secondary | ICD-10-CM | POA: Diagnosis not present

## 2018-01-08 DIAGNOSIS — M5030 Other cervical disc degeneration, unspecified cervical region: Secondary | ICD-10-CM | POA: Insufficient documentation

## 2018-01-08 DIAGNOSIS — M47814 Spondylosis without myelopathy or radiculopathy, thoracic region: Secondary | ICD-10-CM | POA: Diagnosis not present

## 2018-01-08 NOTE — Progress Notes (Signed)
Safety precautions to be maintained throughout the outpatient stay will include: orient to surroundings, keep bed in low position, maintain call bell within reach at all times, provide assistance with transfer out of bed and ambulation.  

## 2018-01-08 NOTE — Progress Notes (Signed)
Patient's Name: Edwin Donovan.  MRN: [id number]  Referring Provider: Volney American,*  DOB: June 20, 1946  PCP: Volney American, PA-C  DOS: 01/08/2018  Note by: Gillis Santa, MD  Service setting: Ambulatory outpatient  Specialty: Interventional Pain Management  Location: [facility] (AMB) Pain Management Facility    Patient type: Established   Primary Reason(s) for Visit: Evaluation of chronic illnesses with exacerbation, or progression (Level of risk: moderate) CC: Knee Pain (bilateral) and Neck Pain  HPI  Edwin Donovan is a 72 y.o. year old, male patient, who comes today for a follow-up evaluation. He has Sepsis ([place]); Right ureteral stone; COPD (chronic obstructive pulmonary disease) ([place]); Hypertension; Insomnia; Anxiety; Cervicalgia; Spondylosis of cervical region without myelopathy or radiculopathy; Primary osteoarthritis of both knees; Lumbar degenerative disc disease; and Chronic pain syndrome on their problem list. Edwin Donovan was last seen on 12/25/2017. His primarily concern today is the Knee Pain (bilateral) and Neck Pain  Pain Assessment: Location: Right, Left(right is greater) Knee Radiating:  no Onset: More than a month ago Duration: Chronic pain Quality: Burning, Shooting Severity: 7 /10 (self-reported pain score)  Note: Reported level is inconsistent with clinical observations. Clinically the patient looks like a 3/10 A 3/10 is viewed as "Moderate" and described as significantly interfering with activities of daily living (ADL). It becomes difficult to feed, bathe, get dressed, get on and off the toilet or to perform personal hygiene functions. Difficult to get in and out of bed or a chair without assistance. Very distracting. With effort, it can be ignored when deeply involved in activities.       When using our objective Pain Scale, levels between 6 and 10/10 are said to belong in an emergency room, as it progressively worsens from a 6/10, described as severely  limiting, requiring emergency care not usually available at an outpatient pain management facility. At a 6/10 level, communication becomes difficult and requires great effort. Assistance to reach the emergency department may be required. Facial flushing and profuse sweating along with potentially dangerous increases in heart rate and blood pressure will be evident. Effect on ADL:   Timing: Constant Modifying factors:    Further details on both, my assessment(s), as well as the proposed treatment plan, please see below.  Patient presents today for follow-up.  He was unable to have his imaging studies performed.  He states that he forgot.  Otherwise we reviewed his urine drug screen.  This was positive for THC and benzodiazepines.  I did discussion with the patient about this.  He states that he does smoke marijuana to help with his pain.  In regards to the benzodiazepines, patient states that he was given Valium for his right urethral stone.  Laboratory Chemistry  Inflammation Markers (CRP: Acute Phase) (ESR: Chronic Phase) Lab Results  Component Value Date   LATICACIDVEN 1.0 11/23/2017                         Rheumatology Markers No results found for: RF, ANA, Therisa [place], [facility]              Renal Function Markers Lab Results  Component Value Date   BUN 18 11/29/2017   CREATININE 0.99 11/29/2017   GFRAA >60 11/29/2017   GFRNONAA >60 11/29/2017                 Hepatic Function Markers Lab Results  Component Value Date   AST 27 11/23/2017   ALT 21  11/23/2017   ALBUMIN 4.1 11/23/2017   ALKPHOS 77 11/23/2017   HCVAB 0.1 06/29/2016   LIPASE 27 11/23/2017                 Electrolytes Lab Results  Component Value Date   NA 135 11/29/2017   K 4.5 11/29/2017   CL 100 (L) 11/29/2017   CALCIUM 8.9 11/29/2017                        Neuropathy Markers Lab Results  Component Value Date   HGBA1C 6.0 06/28/2016                 Bone Pathology Markers No  results found for: [place], [id number], [id number], [id number], 25OHVITD1, 25OHVITD2, 25OHVITD3, TESTOFREE, TESTOSTERONE                       Coagulation Parameters Lab Results  Component Value Date   PLT 154 11/29/2017                 Cardiovascular Markers Lab Results  Component Value Date   BNP 239.0 (H) 06/27/2016   TROPONINI <0.03 11/23/2017   HGB 15.3 11/29/2017   HCT 45.8 11/29/2017                 CA Markers No results found for: CEA, CA125, LABCA2               Note: Lab results reviewed.   Meds   Current Outpatient Medications:  .  acetaminophen (TYLENOL) 500 MG tablet, Take 500 mg by mouth every 6 (six) hours as needed., Disp: , Rfl:  .  albuterol (PROVENTIL HFA;VENTOLIN HFA) 108 (90 Base) MCG/ACT inhaler, Inhale 1-2 puffs into the lungs every 6 (six) hours as needed for wheezing or shortness of breath., Disp: , Rfl:  .  amLODipine (NORVASC) 10 MG tablet, Take 10 mg by mouth daily., Disp: , Rfl:  .  budesonide-formoterol (SYMBICORT) 160-4.5 MCG/ACT inhaler, Inhale 2 puffs into the lungs 2 (two) times daily., Disp: , Rfl:  .  FLUoxetine (PROZAC) 20 MG capsule, Take 40 mg by mouth daily., Disp: , Rfl:  .  lisinopril (PRINIVIL,ZESTRIL) 10 MG tablet, Take 1 tablet (10 mg total) by mouth daily., Disp: 30 tablet, Rfl: 0 .  metoprolol succinate (TOPROL-XL) 25 MG 24 hr tablet, Take 12.5 mg by mouth daily., Disp: , Rfl:  .  traZODone (DESYREL) 100 MG tablet, Take 100 mg by mouth at bedtime as needed for sleep., Disp: , Rfl:  .  HYDROcodone-acetaminophen (NORCO/VICODIN) 5-325 MG tablet, Take 1-2 tablets by mouth every 6 (six) hours as needed for moderate pain. (Patient not taking: Reported on 01/08/2018), Disp: 10 tablet, Rfl: 0 .  ibuprofen (ADVIL,MOTRIN) 600 MG tablet, Take 1 tablet (600 mg total) by mouth every 8 (eight) hours as needed. (Patient not taking: Reported on 12/24/2017), Disp: 20 tablet, Rfl: 0 .  lidocaine (LIDODERM) 5 %, Place 2 patches onto the skin daily. Remove &  Discard patch within 12 hours or as directed by MD (Patient not taking: Reported on 12/25/2017), Disp: 60 patch, Rfl: 6 .  ondansetron (ZOFRAN) 4 MG tablet, Take 1 tablet (4 mg total) by mouth every 8 (eight) hours as needed for nausea or vomiting. (Patient not taking: Reported on 01/08/2018), Disp: 10 tablet, Rfl: 0 .  oxybutynin (DITROPAN) 5 MG tablet, 1 tab tid prn frequency,urgency, bladder spasm (Patient not taking: Reported on 01/08/2018), Disp: 15 tablet, Rfl:  0 .  tamsulosin (FLOMAX) 0.4 MG CAPS capsule, Take 1 capsule (0.4 mg total) by mouth daily. (Patient not taking: Reported on 01/08/2018), Disp: 30 capsule, Rfl: 0  ROS  Constitutional: Denies any fever or chills Gastrointestinal: No reported hemesis, hematochezia, vomiting, or acute GI distress Musculoskeletal: Denies any acute onset joint swelling, redness, loss of ROM, or weakness Neurological: No reported episodes of acute onset apraxia, aphasia, dysarthria, agnosia, amnesia, paralysis, loss of coordination, or loss of consciousness  Allergies  Mr. Verde has No Known Allergies.  [place]  Drug: Mr. Woodrome  reports that he uses drugs. Drug: Marijuana. Alcohol:  reports that he drinks about 8.4 oz of alcohol per week. Tobacco:  reports that he has been smoking cigarettes.  He has been smoking about 0.50 packs per day. he has never used smokeless tobacco. Medical:  has a past medical history of Anxiety, Arthritis, Cancer ([place]), Chronic kidney disease, COPD (chronic obstructive pulmonary disease) ([place]), Dysrhythmia, History of kidney stones, Hypertension, Kidney stone, Pneumonia, and Shortness of breath dyspnea. Surgical: Mr. Eckard  has a past surgical history that includes Prostatectomy; Cystoscopy with stent placement (Right, 06/28/2016); Ureteroscopy (Right, 07/29/2016); Cystoscopy w/ ureteral stent placement (Right, 07/29/2016); Cystoscopy with biopsy (Right, 07/29/2016); Extracorporeal shock wave lithotripsy (Right, 12/04/2017);  Cystoscopy/ureteroscopy/holmium laser/stent placement (Right, 12/26/2017); and Cystoscopy w/ retrogrades (Bilateral, 12/26/2017). Family: family history includes Heart attack in his father; Kidney disease in his father; Lung cancer in his father; Stroke in his mother.  Constitutional Exam  General appearance: Well nourished, well developed, and well hydrated. In no apparent acute distress Vitals:   01/08/18 0955  BP: (!) 165/91  Pulse: 60  Resp: 16  Temp: 97.8 F (36.6 C)  TempSrc: Oral  SpO2: 98%  Weight: 255 lb (115.7 kg)  Height: '5\' 11"'  (1.803 m)   BMI Assessment: Estimated body mass index is 35.57 kg/m as calculated from the following:   Height as of this encounter: '5\' 11"'  (1.803 m).   Weight as of this encounter: 255 lb (115.7 kg).  BMI interpretation table: BMI level Category Range association with higher incidence of chronic pain  <18 kg/m2 Underweight   18.5-24.9 kg/m2 Ideal body weight   25-29.9 kg/m2 Overweight Increased incidence by 20%  30-34.9 kg/m2 Obese (Class I) Increased incidence by 68%  35-39.9 kg/m2 Severe obesity (Class II) Increased incidence by 136%  >40 kg/m2 Extreme obesity (Class III) Increased incidence by 254%   BMI Readings from Last 4 Encounters:  01/08/18 35.57 kg/m  12/26/17 35.57 kg/m  12/25/17 35.57 kg/m  12/24/17 35.57 kg/m   Wt Readings from Last 4 Encounters:  01/08/18 255 lb (115.7 kg)  12/26/17 255 lb (115.7 kg)  12/25/17 255 lb (115.7 kg)  12/24/17 255 lb (115.7 kg)  Psych/Mental status: Alert, oriented x 3 (person, place, & time)       Eyes: PERLA Respiratory: No evidence of acute respiratory distress  Cervical Spine Area Exam  Skin & Axial Inspection: No masses, redness, edema, swelling, or associated skin lesions Alignment: Symmetrical Functional ROM: Decreased ROM      Stability: No instability detected Muscle Tone/Strength: Functionally intact. No obvious neuro-muscular anomalies detected. Sensory (Neurological):  Arthropathic arthralgia Palpation: Complains of area being tender to palpation Positive provocative maneuver for for cervical facet disease         Upper Extremity (UE) Exam    Side: Right upper extremity  Side: Left upper extremity   Skin & Extremity Inspection: Skin color, temperature, and hair growth are WNL. No peripheral edema  or cyanosis. No masses, redness, swelling, asymmetry, or associated skin lesions. No contractures.  Skin & Extremity Inspection: Skin color, temperature, and hair growth are WNL. No peripheral edema or cyanosis. No masses, redness, swelling, asymmetry, or associated skin lesions. No contractures.   Functional ROM: Unrestricted ROM          Functional ROM: Unrestricted ROM           Muscle Tone/Strength: Functionally intact. No obvious neuro-muscular anomalies detected.  Muscle Tone/Strength: Functionally intact. No obvious neuro-muscular anomalies detected.   Sensory (Neurological): Unimpaired          Sensory (Neurological): Unimpaired           Palpation: No palpable anomalies              Palpation: No palpable anomalies               Specialized Test(s): Deferred         Specialized Test(s): Deferred           Thoracic Spine Area Exam  Skin & Axial Inspection: No masses, redness, or swelling Alignment: Symmetrical Functional ROM: Unrestricted ROM Stability: No instability detected Muscle Tone/Strength: Functionally intact. No obvious neuro-muscular anomalies detected. Sensory (Neurological): Unimpaired Muscle strength & Tone: No palpable anomalies  Lumbar Spine Area Exam  Skin & Axial Inspection: No masses, redness, or swelling Alignment: Symmetrical Functional ROM: Unrestricted ROM      Stability: No instability detected Muscle Tone/Strength: Functionally intact. No obvious neuro-muscular anomalies detected. Sensory (Neurological): Unimpaired Palpation: No palpable anomalies       Provocative Tests: Lumbar Hyperextension and rotation test:  Positive bilaterally for facet joint pain. Lumbar Lateral bending test: evaluation deferred today       Patrick's Maneuver: evaluation deferred today                    Gait & Posture Assessment  Ambulation: Limited Gait: Antalgic Posture: Difficulty standing up straight, due to pain   Lower Extremity Exam    Side: Right lower extremity  Side: Left lower extremity  Skin & Extremity Inspection: Skin color, temperature, and hair growth are WNL. No peripheral edema or cyanosis. No masses, redness, swelling, asymmetry, or associated skin lesions. No contractures.  Skin & Extremity Inspection: Skin color, temperature, and hair growth are WNL. No peripheral edema or cyanosis. No masses, redness, swelling, asymmetry, or associated skin lesions. No contractures.  Functional ROM: Unrestricted ROM          Functional ROM: Unrestricted ROM          Muscle Tone/Strength: Functionally intact. No obvious neuro-muscular anomalies detected.  Muscle Tone/Strength: Functionally intact. No obvious neuro-muscular anomalies detected.  Sensory (Neurological): Unimpaired  Sensory (Neurological): Unimpaired  Palpation: No palpable anomalies  Palpation: No palpable anomalies  5 out of 5 strength bilateral lower extremity: Plantar flexion, dorsiflexion, knee flexion, knee extension.  Palpation: No palpable anomalies  Palpation: No palpable anomalies   Assessment  Primary Diagnosis & Pertinent Problem List: The primary encounter diagnosis was Cervicalgia. Diagnoses of Spondylosis of cervical region without myelopathy or radiculopathy, Neck pain, and Chronic pain of both knees were also pertinent to this visit.  Status Diagnosis  Persistent Persistent Persistent 1. Cervicalgia   2. Spondylosis of cervical region without myelopathy or radiculopathy   3. Neck pain   4. Chronic pain of both knees      General Recommendations: The pain condition that the patient suffers from is best treated with a  multidisciplinary approach  that involves an increase in physical activity to prevent de-conditioning and worsening of the pain cycle, as well as psychological counseling (formal and/or informal) to address the co-morbid psychological affects of pain. Treatment will often involve judicious use of pain medications and interventional procedures to decrease the pain, allowing the patient to participate in the physical activity that will ultimately produce long-lasting pain reductions. The goal of the multidisciplinary approach is to return the patient to a higher level of overall function and to restore their ability to perform activities of daily living.  72 year old male who presents with axial neck pain and bilateral knee pain.  The neck pain seems arthritic in nature.  He does not have any radiating symptoms in his upper extremities, denies any paresthesias, has full strength of his upper extremities.  The this could be secondary to cervical spondylosis, cervical degenerative disc disease.  Patient also has bilateral knee pain secondary to primary bilateral osteoarthritis of both knees.  He has previously had cortisone injections which were effective.  We consider repeating these versus bilateral genicular nerve block.  We also discussed cervical facet medial branch nerve blocks for his cervicalgia symptoms.  We also discussed the importance of weight loss and weight management and its bearing on his chronic pain management.  He was unable to have his imaging studies performed that were ordered at last visit.  He states that he forgot.  Otherwise we reviewed his urine drug screen.  This was positive for THC and benzodiazepines.  I did discussion with the patient about this.  Patient endorses marijuana use and states that it helps out with his chronic pain.  In regards to the benzodiazepines, patient states that he was given Valium for his right urethral stone.  Since the patient finds marijuana effective for  his pain, patient will not be an opioid candidate however I discussed that I will be able to support him through non-opioid analgesics as well as interventional options.  Patient was in agreement with this plan.  Patient will follow up with me after completing imaging studies.  . DG Cervical Spine With Flex & Extend  . DG Knee 1-2 Views Left  . DG Knee 1-2 Views Right  . DG Lumbar Spine Complete W/Bend  . DG Thoracic Spine 2 View  . Compliance Drug Analysis, Ur   Future considerations could include intra-articular knee injections, bilateral genicular nerve block.  For his cervical pain, the future consideration could include bilateral cervical facet medial branch nerve blocks, cervical radiofrequency ablation.  Provider-requested follow-up: Return in about 2 weeks (around 01/22/2018) for After Imaging.   Time Note: Greater than 50% of the 15 minute(s) of face-to-face time spent with Mr. Carneal, was spent in counseling/coordination of care regarding: the appropriate use of the pain scale, Mr. Klingel primary cause of pain, the results of his recent test(s), realistic expectations and the goals of pain management (increased in functionality).  Future Appointments  Date Time Provider Alfred  01/22/2018  9:45 AM Gillis Santa, MD [facility] None  01/23/2018  2:15 PM Bernardo Heater, Ronda Fairly, MD [facility] None  04/15/2018  3:00 PM Volney American, PA-C [facility]    Primary Care Physician: Volney American, PA-C Location: [facility] Note by: Gillis Santa, M.D Date: 01/08/2018; Time: 11:35 AM  There are no Patient Instructions on file for this visit.

## 2018-01-22 ENCOUNTER — Encounter: Payer: Self-pay | Admitting: Student in an Organized Health Care Education/Training Program

## 2018-01-22 ENCOUNTER — Other Ambulatory Visit: Payer: Self-pay

## 2018-01-22 ENCOUNTER — Ambulatory Visit
Payer: Medicare Other | Attending: Student in an Organized Health Care Education/Training Program | Admitting: Student in an Organized Health Care Education/Training Program

## 2018-01-22 VITALS — BP 155/79 | HR 80 | Temp 97.7°F | Ht 71.0 in | Wt 260.0 lb

## 2018-01-22 DIAGNOSIS — Z79899 Other long term (current) drug therapy: Secondary | ICD-10-CM | POA: Insufficient documentation

## 2018-01-22 DIAGNOSIS — M4316 Spondylolisthesis, lumbar region: Secondary | ICD-10-CM | POA: Diagnosis not present

## 2018-01-22 DIAGNOSIS — M419 Scoliosis, unspecified: Secondary | ICD-10-CM | POA: Insufficient documentation

## 2018-01-22 DIAGNOSIS — M47812 Spondylosis without myelopathy or radiculopathy, cervical region: Secondary | ICD-10-CM | POA: Diagnosis not present

## 2018-01-22 DIAGNOSIS — F17210 Nicotine dependence, cigarettes, uncomplicated: Secondary | ICD-10-CM | POA: Insufficient documentation

## 2018-01-22 DIAGNOSIS — M170 Bilateral primary osteoarthritis of knee: Secondary | ICD-10-CM | POA: Diagnosis not present

## 2018-01-22 DIAGNOSIS — I129 Hypertensive chronic kidney disease with stage 1 through stage 4 chronic kidney disease, or unspecified chronic kidney disease: Secondary | ICD-10-CM | POA: Diagnosis not present

## 2018-01-22 DIAGNOSIS — G894 Chronic pain syndrome: Secondary | ICD-10-CM | POA: Diagnosis not present

## 2018-01-22 DIAGNOSIS — N189 Chronic kidney disease, unspecified: Secondary | ICD-10-CM | POA: Insufficient documentation

## 2018-01-22 DIAGNOSIS — F419 Anxiety disorder, unspecified: Secondary | ICD-10-CM | POA: Diagnosis not present

## 2018-01-22 DIAGNOSIS — G4700 Insomnia, unspecified: Secondary | ICD-10-CM | POA: Insufficient documentation

## 2018-01-22 DIAGNOSIS — M5136 Other intervertebral disc degeneration, lumbar region: Secondary | ICD-10-CM

## 2018-01-22 DIAGNOSIS — J449 Chronic obstructive pulmonary disease, unspecified: Secondary | ICD-10-CM | POA: Diagnosis not present

## 2018-01-22 NOTE — Progress Notes (Signed)
Patient's Name: Edwin Donovan.  MRN: [id number]  Referring Provider: Volney American,*  DOB: [birth year]  PCP: Volney American, PA-C  DOS: 01/22/2018  Note by: Gillis Santa, MD  Service setting: Ambulatory outpatient  Specialty: Interventional Pain Management  Location: [facility] (AMB) Pain Management Facility    Patient type: Established   Primary Reason(s) for Visit: Evaluation of chronic illnesses with exacerbation, or progression (Level of risk: moderate) CC: Knee Pain (both)  HPI  Edwin Donovan is a 72 y.o. year old, male patient, who comes today for a follow-up evaluation. He has Sepsis ([place]); Right ureteral stone; COPD (chronic obstructive pulmonary disease) ([place]); Hypertension; Insomnia; Anxiety; Cervicalgia; Spondylosis of cervical region without myelopathy or radiculopathy; Primary osteoarthritis of both knees; Lumbar degenerative disc disease; and Chronic pain syndrome on their problem list. Mr. Isola was last seen on 01/08/2018. His primarily concern today is the Knee Pain (both)  Pain Assessment: Location: Right, Left Knee Radiating: Denies Onset: More than a month ago Duration: Chronic pain Quality: Sharp, Aching Severity: 8 /10 (self-reported pain score)  Note: Reported level is inconsistent with clinical observations. Clinically the patient looks like a 2/10 A 2/10 is viewed as "Mild to Moderate" and described as noticeable and distracting. Impossible to hide from other people. More frequent flare-ups. Still possible to adapt and function close to normal. It can be very annoying and may have occasional stronger flare-ups. With discipline, patients may get used to it and adapt.       When using our objective Pain Scale, levels between 6 and 10/10 are said to belong in an emergency room, as it progressively worsens from a 6/10, described as severely limiting, requiring emergency care not usually available at an outpatient pain management facility. At a 6/10 level,  communication becomes difficult and requires great effort. Assistance to reach the emergency department may be required. Facial flushing and profuse sweating along with potentially dangerous increases in heart rate and blood pressure will be evident. Effect on ADL:   Timing: Intermittent Modifying factors: sitting down  Further details on both, my assessment(s), as well as the proposed treatment plan, please see below.  Laboratory Chemistry  Inflammation Markers (CRP: Acute Phase) (ESR: Chronic Phase) Lab Results  Component Value Date   LATICACIDVEN 1.0 11/23/2017                         Rheumatology Markers No results found for: RF, ANA, Therisa D[place], [facility]              Renal Function Markers Lab Results  Component Value Date   BUN 18 11/29/2017   CREATININE 0.99 11/29/2017   GFRAA >60 11/29/2017   GFRNONAA >60 11/29/2017                 Hepatic Function Markers Lab Results  Component Value Date   AST 27 11/23/2017   ALT 21 11/23/2017   ALBUMIN 4.1 11/23/2017   ALKPHOS 77 11/23/2017   HCVAB 0.1 06/29/2016   LIPASE 27 11/23/2017                 Electrolytes Lab Results  Component Value Date   NA 135 11/29/2017   K 4.5 11/29/2017   CL 100 (L) 11/29/2017   CALCIUM 8.9 11/29/2017                        Neuropathy Markers Lab Results  Component Value Date  HGBA1C 6.0 06/28/2016                 Bone Pathology Markers No results found for: VD25OH, IZ124PY0DXI, PJ8250NL9, JQ7341PF7, 25OHVITD1, 25OHVITD2, 25OHVITD3, TESTOFREE, TESTOSTERONE                       Coagulation Parameters Lab Results  Component Value Date   PLT 154 11/29/2017                 Cardiovascular Markers Lab Results  Component Value Date   BNP 239.0 (H) 06/27/2016   TROPONINI <0.03 11/23/2017   HGB 15.3 11/29/2017   HCT 45.8 11/29/2017                 CA Markers No results found for: CEA, CA125, LABCA2               Note: Lab results reviewed.  Recent  Diagnostic Imaging Review  Cervical Imaging:  Cervical DG Bending/F/E views:  Results for orders placed during the hospital encounter of 01/08/18  DG Cervical Spine With Flex & Extend   Narrative CLINICAL DATA:  Chronic neck pain.  No known injury.  EXAM: CERVICAL SPINE COMPLETE WITH FLEXION AND EXTENSION VIEWS  COMPARISON:  None.  FINDINGS: No fracture is identified. 0.5 cm anterolisthesis C2 on C3 in flexion reduces on extension. Alignment is otherwise maintained. There is severe loss of disc space height and endplate spurring from T0-W4. Multilevel facet arthropathy is identified. Prevertebral soft tissues appear normal. Lung apices are clear. Carotid atherosclerosis noted.  IMPRESSION: 0.5 cm anterolisthesis C2 on C3 on flexion reduces on extension.  Severe multilevel degenerative disc disease.  Carotid atherosclerosis.   Electronically Signed   By: Inge Rise M.D.   On: 01/08/2018 13:14     Results for orders placed during the hospital encounter of 01/08/18  [facility] Thoracic Spine 2 View   Narrative CLINICAL DATA:  Chronic thoracic spine pain.  EXAM: THORACIC SPINE 2 VIEWS  COMPARISON:  CT chest 06/30/2016.  FINDINGS: Convex right scoliosis with the apex T9 is unchanged. No listhesis is identified. The patient has marked multilevel degenerative disc disease. Paraspinous structures demonstrate aortic atherosclerosis.  IMPRESSION: Convex right scoliosis.  Multilevel degenerative disc disease.   Electronically Signed   By: Inge Rise M.D.   On: 01/08/2018 13:18    sit. Lumbar DG F/E views: No results found for this or any previous visit. Lumbar DG Bending views:  Results for orders placed during the hospital encounter of 01/08/18  DG Lumbar Spine Complete W/Bend   Narrative CLINICAL DATA:  Chronic low back pain.  EXAM: LUMBAR SPINE - COMPLETE WITH BENDING VIEWS  COMPARISON:  None.  FINDINGS: No fracture is identified. 0.9 cm  anterolisthesis of 0.9 cm facet mediated anterolisthesis L4 on L5 is unchanged in flexion or extension. There is convex right scoliosis with the apex at L2-3. Multilevel loss of disc space height and facet arthropathy appear worst from L3-S1. Paraspinous structures demonstrate multiple surgical clips in the pelvis.  IMPRESSION: 0.9 cm facet mediated anterolisthesis L4 on L5 is stable on flexion and extension.  Convex right scoliosis.  Advanced degenerative disease L3-S1.   Electronically Signed   By: Inge Rise M.D.   On: 01/08/2018 13:17     Knee-R DG 1-2 views:  Results for orders placed during the hospital encounter of 01/08/18  DG Knee 1-2 Views Right   Narrative CLINICAL DATA:  Chronic right knee pain.  EXAM: RIGHT  KNEE - 1-2 VIEW  COMPARISON:  Plain films left knee today.  FINDINGS: There is some medial compartment joint space narrowing. Osteophytes are seen about all 3 compartments and most prominent off the patella and about the lateral compartment. No fracture or dislocation. Small joint effusion is seen. Atherosclerosis is noted.  IMPRESSION: Tricompartmental degenerative disease about the right knee appears worse than that seen on the left.  Atherosclerosis.   Electronically Signed   By: Inge Rise M.D.   On: 01/08/2018 13:15    Knee-L DG 1-2 views:  Results for orders placed during the hospital encounter of 01/08/18  DG Knee 1-2 Views Left   Narrative CLINICAL DATA:  Chronic left knee pain.  No known injury.  EXAM: LEFT KNEE - 1-2 VIEW  COMPARISON:  None.  FINDINGS: Joint spaces are preserved. Small osteophytes are seen about the lateral and patellofemoral compartments. No fracture or focal bony lesion. Atherosclerosis noted.  IMPRESSION: Mild lateral and patellofemoral osteoarthritis.  No acute finding.   Electronically Signed   By: Inge Rise M.D.   On: 01/08/2018 13:14      Complexity Note: Imaging results  reviewed. Results shared with Mr. Bahl, using Layman's terms.                         Meds   Current Outpatient Medications:  .  acetaminophen (TYLENOL) 500 MG tablet, Take 500 mg by mouth every 6 (six) hours as needed., Disp: , Rfl:  .  albuterol (PROVENTIL HFA;VENTOLIN HFA) 108 (90 Base) MCG/ACT inhaler, Inhale 1-2 puffs into the lungs every 6 (six) hours as needed for wheezing or shortness of breath., Disp: , Rfl:  .  amLODipine (NORVASC) 10 MG tablet, Take 10 mg by mouth daily., Disp: , Rfl:  .  budesonide-formoterol (SYMBICORT) 160-4.5 MCG/ACT inhaler, Inhale 2 puffs into the lungs 2 (two) times daily., Disp: , Rfl:  .  FLUoxetine (PROZAC) 20 MG capsule, Take 40 mg by mouth daily., Disp: , Rfl:  .  lisinopril (PRINIVIL,ZESTRIL) 10 MG tablet, Take 1 tablet (10 mg total) by mouth daily., Disp: 30 tablet, Rfl: 0 .  metoprolol succinate (TOPROL-XL) 25 MG 24 hr tablet, Take 12.5 mg by mouth daily., Disp: , Rfl:  .  traZODone (DESYREL) 100 MG tablet, Take 100 mg by mouth at bedtime as needed for sleep., Disp: , Rfl:  .  HYDROcodone-acetaminophen (NORCO/VICODIN) 5-325 MG tablet, Take 1-2 tablets by mouth every 6 (six) hours as needed for moderate pain. (Patient not taking: Reported on 01/08/2018), Disp: 10 tablet, Rfl: 0 .  ibuprofen (ADVIL,MOTRIN) 600 MG tablet, Take 1 tablet (600 mg total) by mouth every 8 (eight) hours as needed. (Patient not taking: Reported on 12/24/2017), Disp: 20 tablet, Rfl: 0 .  lidocaine (LIDODERM) 5 %, Place 2 patches onto the skin daily. Remove & Discard patch within 12 hours or as directed by MD (Patient not taking: Reported on 12/25/2017), Disp: 60 patch, Rfl: 6 .  ondansetron (ZOFRAN) 4 MG tablet, Take 1 tablet (4 mg total) by mouth every 8 (eight) hours as needed for nausea or vomiting. (Patient not taking: Reported on 01/08/2018), Disp: 10 tablet, Rfl: 0 .  oxybutynin (DITROPAN) 5 MG tablet, 1 tab tid prn frequency,urgency, bladder spasm (Patient not taking: Reported  on 01/08/2018), Disp: 15 tablet, Rfl: 0 .  tamsulosin (FLOMAX) 0.4 MG CAPS capsule, Take 1 capsule (0.4 mg total) by mouth daily. (Patient not taking: Reported on 01/08/2018), Disp: 30 capsule, Rfl: 0  ROS  Constitutional: Denies any fever or chills Gastrointestinal: No reported hemesis, hematochezia, vomiting, or acute GI distress Musculoskeletal: Denies any acute onset joint swelling, redness, loss of ROM, or weakness Neurological: No reported episodes of acute onset apraxia, aphasia, dysarthria, agnosia, amnesia, paralysis, loss of coordination, or loss of consciousness  Allergies  Mr. Nole has No Known Allergies.  [place]  Drug: Mr. Hartwell  reports that he uses drugs. Drug: Marijuana. Alcohol:  reports that he drinks about 8.4 oz of alcohol per week. Tobacco:  reports that he has been smoking cigarettes.  He has been smoking about 0.50 packs per day. he has never used smokeless tobacco. Medical:  has a past medical history of Anxiety, Arthritis, Cancer ([place]), Chronic kidney disease, COPD (chronic obstructive pulmonary disease) ([place]), Dysrhythmia, History of kidney stones, Hypertension, Kidney stone, Pneumonia, and Shortness of breath dyspnea. Surgical: Mr. Comas  has a past surgical history that includes Prostatectomy; Cystoscopy with stent placement (Right, 06/28/2016); Ureteroscopy (Right, 07/29/2016); Cystoscopy w/ ureteral stent placement (Right, 07/29/2016); Cystoscopy with biopsy (Right, 07/29/2016); Extracorporeal shock wave lithotripsy (Right, 12/04/2017); Cystoscopy/ureteroscopy/holmium laser/stent placement (Right, 12/26/2017); and Cystoscopy w/ retrogrades (Bilateral, 12/26/2017). Family: family history includes Heart attack in his father; Kidney disease in his father; Lung cancer in his father; Stroke in his mother.  Constitutional Exam  General appearance: Well nourished, well developed, and well hydrated. In no apparent acute distress Vitals:   01/22/18 0958  BP: (!) 155/79   Pulse: 80  Temp: 97.7 F (36.5 C)  SpO2: 95%  Weight: 260 lb (117.9 kg)  Height: '5\' 11"'$  (1.803 m)   BMI Assessment: Estimated body mass index is 36.26 kg/m as calculated from the following:   Height as of this encounter: '5\' 11"'$  (1.803 m).   Weight as of this encounter: 260 lb (117.9 kg).  BMI interpretation table: BMI level Category Range association with higher incidence of chronic pain  <18 kg/m2 Underweight   18.5-24.9 kg/m2 Ideal body weight   25-29.9 kg/m2 Overweight Increased incidence by 20%  30-34.9 kg/m2 Obese (Class I) Increased incidence by 68%  35-39.9 kg/m2 Severe obesity (Class II) Increased incidence by 136%  >40 kg/m2 Extreme obesity (Class III) Increased incidence by 254%   BMI Readings from Last 4 Encounters:  01/22/18 36.26 kg/m  01/08/18 35.57 kg/m  12/26/17 35.57 kg/m  12/25/17 35.57 kg/m   Wt Readings from Last 4 Encounters:  01/22/18 260 lb (117.9 kg)  01/08/18 255 lb (115.7 kg)  12/26/17 255 lb (115.7 kg)  12/25/17 255 lb (115.7 kg)  Psych/Mental status: Alert, oriented x 3 (person, place, & time)       Eyes: PERLA Respiratory: No evidence of acute respiratory distress  Cervical Spine Area Exam  Skin & Axial Inspection: No masses, redness, edema, swelling, or associated skin lesions Alignment: Symmetrical Functional ROM: Decreased ROM      Stability: No instability detected Muscle Tone/Strength: Functionally intact. No obvious neuro-muscular anomalies detected. Sensory (Neurological): Arthropathic arthralgia Palpation: Complains of area being tender to palpation Positive provocative maneuver for for bilateral occipital neuralgia and cervical facet disease  Upper Extremity (UE) Exam    Side: Right upper extremity  Side: Left upper extremity  Skin & Extremity Inspection: Skin color, temperature, and hair growth are WNL. No peripheral edema or cyanosis. No masses, redness, swelling, asymmetry, or associated skin lesions. No contractures.  Skin  & Extremity Inspection: Skin color, temperature, and hair growth are WNL. No peripheral edema or cyanosis. No masses, redness, swelling, asymmetry, or associated skin lesions.  No contractures.  Functional ROM: Unrestricted ROM          Functional ROM: Unrestricted ROM          Muscle Tone/Strength: Functionally intact. No obvious neuro-muscular anomalies detected.  Muscle Tone/Strength: Functionally intact. No obvious neuro-muscular anomalies detected.  Sensory (Neurological): Unimpaired          Sensory (Neurological): Unimpaired          Palpation: No palpable anomalies              Palpation: No palpable anomalies              Specialized Test(s): Deferred         Specialized Test(s): Deferred          Thoracic Spine Area Exam  Skin & Axial Inspection: No masses, redness, or swelling Alignment: Symmetrical Functional ROM: Unrestricted ROM Stability: No instability detected Muscle Tone/Strength: Functionally intact. No obvious neuro-muscular anomalies detected. Sensory (Neurological): Unimpaired Muscle strength & Tone: No palpable anomalies  Lumbar Spine Area Exam  Skin & Axial Inspection: No masses, redness, or swelling Alignment: Symmetrical Functional ROM: Decreased ROM, bilaterally Stability: No instability detected Muscle Tone/Strength: Functionally intact. No obvious neuro-muscular anomalies detected. Sensory (Neurological): Articular pain pattern Palpation: Complains of area being tender to palpation Bilateral Fist Percussion Test Provocative Tests: Lumbar Hyperextension and rotation test: Positive bilaterally for facet joint pain. Lumbar Lateral bending test: Positive due to pain. Patrick's Maneuver: evaluation deferred today                    Gait & Posture Assessment  Ambulation: Unassisted Gait: Relatively normal for age and body habitus Posture: WNL   Lower Extremity Exam    Side: Right lower extremity  Side: Left lower extremity  Skin & Extremity Inspection: Skin  color, temperature, and hair growth are WNL. No peripheral edema or cyanosis. No masses, redness, swelling, asymmetry, or associated skin lesions. No contractures.  Skin & Extremity Inspection: Skin color, temperature, and hair growth are WNL. No peripheral edema or cyanosis. No masses, redness, swelling, asymmetry, or associated skin lesions. No contractures.  Functional ROM: Unrestricted ROM          Functional ROM: Unrestricted ROM          Muscle Tone/Strength: Functionally intact. No obvious neuro-muscular anomalies detected.  Muscle Tone/Strength: Functionally intact. No obvious neuro-muscular anomalies detected.  Sensory (Neurological): Arthropathic arthralgia  Sensory (Neurological): Arthropathic arthralgia  Palpation: Complains of area being tender to palpation  Palpation: Complains of area being tender to palpation   Assessment  Primary Diagnosis & Pertinent Problem List: The primary encounter diagnosis was Primary osteoarthritis of both knees. Diagnoses of Bilateral primary osteoarthritis of knee, Spondylosis of cervical region without myelopathy or radiculopathy, Lumbar degenerative disc disease, and Chronic pain syndrome were also pertinent to this visit.  Status Diagnosis  Persistent Persistent Persistent 1. Primary osteoarthritis of both knees   2. Bilateral primary osteoarthritis of knee   3. Spondylosis of cervical region without myelopathy or radiculopathy   4. Lumbar degenerative disc disease   5. Chronic pain syndrome      72 year old male who presents as a follow-up for diffuse widespread pain localized to his neck, low back, bilateral knees.  At his visit today, his x-ray images were reviewed.  Patient has severe degenerative cervical disc disease as well as cervical spondylosis contributing to his neck and shoulder pain.  Patient also has severe multilevel lumbar degenerative disc disease and lumbar spondylosis.  Patient  also has moderate bilateral knee osteoarthritis,  right greater than left.  Today we discussed various treatment plans.  Since the patient's knee pain is most painful for him, we discussed bilateral genicular nerve block under fluoroscopy with sedation.  Risks and benefits were reviewed and patient would like to proceed.  After addressing the patient's knees, we will move on to the patient's neck pain and try bilateral cervical facet medial branch nerve blocks.  As discussed previously, since the patient finds marijuana effective for his pain, patient will not be an opioid candidate however I discussed that I will be able to support him through non-opioid analgesics as well as interventional options.  Patient was in agreement with this plan.  Plan: -Bilateral genicular nerve block without sedation under fluoroscopy.  Future considerations: Cervical facet medial branch nerve blocks at C4, C5, C6, C7 bilaterally for cervical spondylosis; lumbar facet medial branch nerve blocks for lumbar spondylosis and lumbar facet arthropathy.  Lab-work, procedure(s), and/or referral(s): Orders Placed This Encounter  Procedures  . GENICULAR NERVE BLOCK      Provider-requested follow-up: Return in about 2 weeks (around 02/05/2018) for Procedure. Time Note: Greater than 50% of the 25 minute(s) of face-to-face time spent with Mr. Wik, was spent in counseling/coordination of care regarding: Mr. Merolla primary cause of pain, the results of his recent test(s), the significance of each one oth the test(s) anomalies and it's corresponding characteristic pain pattern(s), the treatment plan, treatment alternatives, the risks and possible complications of proposed treatment, going over the informed consent and realistic expectations. Future Appointments  Date Time Provider [place]  01/23/2018  2:15 PM Abbie Sons, MD [facility] None  02/09/2018 10:30 AM Gillis Santa, MD [facility] None  04/15/2018  3:00 PM Volney American, PA-C [facility]     Primary Care Physician: Volney American, PA-C Location: [facility] Note by: Gillis Santa, M.D Date: 01/22/2018; Time: 12:51 PM  There are no Patient Instructions on file for this visit.

## 2018-01-23 ENCOUNTER — Ambulatory Visit: Payer: Medicare Other | Admitting: Urology

## 2018-01-27 ENCOUNTER — Ambulatory Visit: Payer: Medicare Other | Admitting: Urology

## 2018-01-28 ENCOUNTER — Ambulatory Visit: Payer: Medicare Other | Admitting: Urology

## 2018-02-03 ENCOUNTER — Telehealth: Payer: Self-pay | Admitting: *Deleted

## 2018-02-03 NOTE — Telephone Encounter (Signed)
Patient would like sedation with his procedure. Given pre procedure instructions.

## 2018-02-09 ENCOUNTER — Ambulatory Visit (INDEPENDENT_AMBULATORY_CARE_PROVIDER_SITE_OTHER): Payer: Medicare Other | Admitting: Urology

## 2018-02-09 ENCOUNTER — Telehealth: Payer: Self-pay | Admitting: Urology

## 2018-02-09 ENCOUNTER — Ambulatory Visit
Admission: RE | Admit: 2018-02-09 | Discharge: 2018-02-09 | Disposition: A | Discharge status: Home / Self Care | Payer: Medicare Other | Source: Ambulatory Visit | Attending: Urology | Admitting: Urology

## 2018-02-09 ENCOUNTER — Ambulatory Visit: Payer: Medicare Other | Admitting: Student in an Organized Health Care Education/Training Program

## 2018-02-09 ENCOUNTER — Encounter: Payer: Self-pay | Admitting: Urology

## 2018-02-09 VITALS — BP 113/71 | HR 71 | Ht 71.0 in | Wt 255.0 lb

## 2018-02-09 DIAGNOSIS — N200 Calculus of kidney: Secondary | ICD-10-CM

## 2018-02-09 DIAGNOSIS — M4186 Other forms of scoliosis, lumbar region: Secondary | ICD-10-CM | POA: Insufficient documentation

## 2018-02-09 DIAGNOSIS — Z87442 Personal history of urinary calculi: Secondary | ICD-10-CM | POA: Diagnosis not present

## 2018-02-09 DIAGNOSIS — M47816 Spondylosis without myelopathy or radiculopathy, lumbar region: Secondary | ICD-10-CM | POA: Insufficient documentation

## 2018-02-09 DIAGNOSIS — Z09 Encounter for follow-up examination after completed treatment for conditions other than malignant neoplasm: Secondary | ICD-10-CM | POA: Diagnosis not present

## 2018-02-09 NOTE — Progress Notes (Signed)
02/09/2018 1:30 PM   Edwin Donovan. 1946-01-12 [id number]  Referring provider: Volney American, PA-C [address] [place], [place] [address]  Chief Complaint  Patient presents with  . Post-op Follow-up    4wk w/KUB    HPI: Edwin Donovan presents for postop follow-up.  He underwent ureteroscopic removal of a right distal calculus on 12/26/2017 After failed shockwave lithotripsy.  He had no postoperative problems after stent removal.  He has no complaints today.  Stone analysis was 5% calcium oxalate monohydrate/75% calcium oxalate dihydrate 20% calcium phosphate.  He is a recurrent stone former.   PMH: Past Medical History:  Diagnosis Date  . Anxiety    ptsd  . Arthritis   . Cancer ([place])    PROSTATE  . Chronic kidney disease    stones  . COPD (chronic obstructive pulmonary disease) ([place])   . Dysrhythmia   . History of kidney stones   . Hypertension   . Kidney stone   . Pneumonia    06/27/16  . Shortness of breath dyspnea     Surgical History: Past Surgical History:  Procedure Laterality Date  . CYSTOSCOPY W/ RETROGRADES Bilateral 12/26/2017   Procedure: CYSTOSCOPY WITH RETROGRADE PYELOGRAM;  Surgeon: Abbie Sons, MD;  Location: [facility];  Service: Urology;  Laterality: Bilateral;  . CYSTOSCOPY W/ URETERAL STENT PLACEMENT Right 07/29/2016   Procedure: CYSTOSCOPY WITH STENT REPLACEMENT;  Surgeon: Hollice Espy, MD;  Location: [facility];  Service: Urology;  Laterality: Right;  . CYSTOSCOPY WITH BIOPSY Right 07/29/2016   Procedure: CYSTOSCOPY WITH BIOPSY;  Surgeon: Hollice Espy, MD;  Location: [facility];  Service: Urology;  Laterality: Right;  . CYSTOSCOPY WITH STENT PLACEMENT Right 06/28/2016   Procedure: CYSTOSCOPY WITH STENT PLACEMENT;  Surgeon: Hollice Espy, MD;  Location: [facility];  Service: Urology;  Laterality: Right;  . CYSTOSCOPY/URETEROSCOPY/HOLMIUM LASER/STENT PLACEMENT Right 12/26/2017   Procedure: CYSTOSCOPY/URETEROSCOPY/HOLMIUM LASER/STENT  PLACEMENT;  Surgeon: Abbie Sons, MD;  Location: [facility];  Service: Urology;  Laterality: Right;  . EXTRACORPOREAL SHOCK WAVE LITHOTRIPSY Right 12/04/2017   Procedure: EXTRACORPOREAL SHOCK WAVE LITHOTRIPSY (ESWL);  Surgeon: Hollice Espy, MD;  Location: [facility];  Service: Urology;  Laterality: Right;  . PROSTATECTOMY    . URETEROSCOPY Right 07/29/2016   Procedure: URETEROSCOPY;  Surgeon: Hollice Espy, MD;  Location: [facility];  Service: Urology;  Laterality: Right;    Home Medications:  Allergies as of 02/09/2018   No Known Allergies     Medication List        Accurate as of 02/09/18  1:30 PM. Always use your most recent med list.          acetaminophen 500 MG tablet Commonly known as:  TYLENOL Take 500 mg by mouth every 6 (six) hours as needed.   albuterol 108 (90 Base) MCG/ACT inhaler Commonly known as:  PROVENTIL HFA;VENTOLIN HFA Inhale 1-2 puffs into the lungs every 6 (six) hours as needed for wheezing or shortness of breath.   amLODipine 10 MG tablet Commonly known as:  NORVASC Take 10 mg by mouth daily.   budesonide-formoterol 160-4.5 MCG/ACT inhaler Commonly known as:  SYMBICORT Inhale 2 puffs into the lungs 2 (two) times daily.   FLUoxetine 20 MG capsule Commonly known as:  PROZAC Take 40 mg by mouth daily.   ibuprofen 600 MG tablet Commonly known as:  ADVIL,MOTRIN Take 1 tablet (600 mg total) by mouth every 8 (eight) hours as needed.   lidocaine 5 % Commonly known as:  Roe  2 patches onto the skin daily. Remove & Discard patch within 12 hours or as directed by MD   lisinopril 10 MG tablet Commonly known as:  PRINIVIL,ZESTRIL Take 1 tablet (10 mg total) by mouth daily.   metoprolol succinate 25 MG 24 hr tablet Commonly known as:  TOPROL-XL Take 12.5 mg by mouth daily.   traZODone 100 MG tablet Commonly known as:  DESYREL Take 100 mg by mouth at bedtime as needed for sleep.       Allergies: No Known Allergies  Family  History: Family History  Problem Relation Age of Onset  . Stroke Mother   . Heart attack Father   . Lung cancer Father   . Kidney disease Father   . Prostate cancer Neg Hx   . Bladder Cancer Neg Hx     Social History:  reports that he has been smoking cigarettes.  He has been smoking about 0.50 packs per day. He has never used smokeless tobacco. He reports that he drinks about 8.4 oz of alcohol per week. He reports that he has current or past drug history. Drug: Marijuana.  ROS: UROLOGY Frequent Urination?: No Hard to postpone urination?: No Burning/pain with urination?: No Get up at night to urinate?: No Leakage of urine?: No Urine stream starts and stops?: No Trouble starting stream?: No Do you have to strain to urinate?: No Blood in urine?: No Urinary tract infection?: No Sexually transmitted disease?: No Injury to kidneys or bladder?: No Painful intercourse?: No Weak stream?: No Erection problems?: No Penile pain?: No  Gastrointestinal Nausea?: No Vomiting?: No Indigestion/heartburn?: No Diarrhea?: No Constipation?: No  Constitutional Fever: No Night sweats?: No Weight loss?: No Fatigue?: No  Skin Skin rash/lesions?: No Itching?: No  Eyes Blurred vision?: No Double vision?: No  Ears/Nose/Throat Sore throat?: No Sinus problems?: No  Hematologic/Lymphatic Swollen glands?: No Easy bruising?: No  Cardiovascular Leg swelling?: No Chest pain?: No  Respiratory Cough?: No Shortness of breath?: No  Endocrine Excessive thirst?: No  Musculoskeletal Back pain?: Yes Joint pain?: Yes  Neurological Headaches?: No Dizziness?: No  Psychologic Depression?: No Anxiety?: No  Physical Exam: BP 113/71   Pulse 71   Ht 5\' 11"  (1.803 m)   Wt 255 lb (115.7 kg)   BMI 35.57 kg/m   Constitutional:  Alert and oriented, No acute distress. HEENT: [place] AT, moist mucus membranes.  Trachea midline, no masses. Cardiovascular: No clubbing, cyanosis, or  edema. Respiratory: Normal respiratory effort, no increased work of breathing. GI: Abdomen is soft, nontender, nondistended, no abdominal masses GU: No CVA tenderness Lymph: No cervical or inguinal lymphadenopathy. Skin: No rashes, bruises or suspicious lesions. Neurologic: Grossly intact, no focal deficits, moving all 4 extremities. Psychiatric: Normal mood and affect.  Laboratory Data: Lab Results  Component Value Date   WBC 5.1 11/29/2017   HGB 15.3 11/29/2017   HCT 45.8 11/29/2017   MCV 99.8 11/29/2017   PLT 154 11/29/2017    Lab Results  Component Value Date   CREATININE 0.99 11/29/2017    Pertinent Imaging: KUB performed today was reviewed and there are no calcifications suspicious for residual stone fragments.  Assessment & Plan:   Doing well status post ureteroscopic stone removal.  He is a recurrent stone former and have recommended proceeding with a metabolic evaluation to include a 24 urine study.  He is in agreement with this plan.   Abbie Sons, [place] [address], [place], [place] [address] [phone]

## 2018-02-10 ENCOUNTER — Other Ambulatory Visit: Payer: Self-pay

## 2018-02-10 DIAGNOSIS — N200 Calculus of kidney: Secondary | ICD-10-CM

## 2018-02-11 ENCOUNTER — Ambulatory Visit
Admission: RE | Admit: 2018-02-11 | Discharge: 2018-02-11 | Disposition: A | Discharge status: Home / Self Care | Payer: Medicare Other | Source: Ambulatory Visit | Attending: Student in an Organized Health Care Education/Training Program | Admitting: Student in an Organized Health Care Education/Training Program

## 2018-02-11 ENCOUNTER — Ambulatory Visit (HOSPITAL_BASED_OUTPATIENT_CLINIC_OR_DEPARTMENT_OTHER): Payer: Medicare Other | Admitting: Student in an Organized Health Care Education/Training Program

## 2018-02-11 ENCOUNTER — Encounter: Payer: Self-pay | Admitting: Student in an Organized Health Care Education/Training Program

## 2018-02-11 ENCOUNTER — Other Ambulatory Visit: Payer: Self-pay

## 2018-02-11 VITALS — BP 163/90 | HR 53 | Temp 98.4°F | Resp 14 | Ht 71.0 in | Wt 255.0 lb

## 2018-02-11 DIAGNOSIS — G894 Chronic pain syndrome: Secondary | ICD-10-CM

## 2018-02-11 DIAGNOSIS — M25562 Pain in left knee: Secondary | ICD-10-CM | POA: Insufficient documentation

## 2018-02-11 DIAGNOSIS — M25561 Pain in right knee: Secondary | ICD-10-CM

## 2018-02-11 DIAGNOSIS — M170 Bilateral primary osteoarthritis of knee: Secondary | ICD-10-CM

## 2018-02-11 DIAGNOSIS — M542 Cervicalgia: Secondary | ICD-10-CM | POA: Diagnosis present

## 2018-02-11 DIAGNOSIS — G8929 Other chronic pain: Secondary | ICD-10-CM

## 2018-02-11 DIAGNOSIS — M549 Dorsalgia, unspecified: Secondary | ICD-10-CM | POA: Diagnosis present

## 2018-02-11 DIAGNOSIS — Z9889 Other specified postprocedural states: Secondary | ICD-10-CM | POA: Diagnosis not present

## 2018-02-11 MED ORDER — ROPIVACAINE HCL 2 MG/ML IJ SOLN
10.0000 mL | Freq: Once | INTRAMUSCULAR | Status: AC
  Administered 2018-02-11: 10 mL

## 2018-02-11 MED ORDER — DEXAMETHASONE SODIUM PHOSPHATE 10 MG/ML IJ SOLN
10.0000 mg | Freq: Once | INTRAMUSCULAR | Status: AC
  Administered 2018-02-11: 10 mg

## 2018-02-11 MED ORDER — LIDOCAINE HCL (PF) 1 % IJ SOLN
INTRAMUSCULAR | Status: AC
  Filled 2018-02-11: qty 5

## 2018-02-11 MED ORDER — FENTANYL CITRATE (PF) 100 MCG/2ML IJ SOLN
INTRAMUSCULAR | Status: AC
  Filled 2018-02-11: qty 2

## 2018-02-11 MED ORDER — LIDOCAINE HCL 1 % IJ SOLN
10.0000 mL | Freq: Once | INTRAMUSCULAR | Status: AC
  Administered 2018-02-11: 10 mL
  Filled 2018-02-11: qty 10

## 2018-02-11 MED ORDER — DEXAMETHASONE SODIUM PHOSPHATE 10 MG/ML IJ SOLN
INTRAMUSCULAR | Status: AC
  Filled 2018-02-11: qty 1

## 2018-02-11 MED ORDER — LACTATED RINGERS IV SOLN
1000.0000 mL | Freq: Once | INTRAVENOUS | Status: AC
  Administered 2018-02-11: 1000 mL via INTRAVENOUS

## 2018-02-11 MED ORDER — FENTANYL CITRATE (PF) 100 MCG/2ML IJ SOLN
25.0000 ug | INTRAMUSCULAR | Status: DC | PRN
  Administered 2018-02-11: 50 ug via INTRAVENOUS

## 2018-02-11 MED ORDER — ROPIVACAINE HCL 2 MG/ML IJ SOLN
INTRAMUSCULAR | Status: AC
  Filled 2018-02-11: qty 10

## 2018-02-11 NOTE — Progress Notes (Signed)
Safety precautions to be maintained throughout the outpatient stay will include: orient to surroundings, keep bed in low position, maintain call bell within reach at all times, provide assistance with transfer out of bed and ambulation.  

## 2018-02-11 NOTE — Progress Notes (Signed)
Patient's Name: Edwin Donovan.  MRN: [id number]  Referring Provider: Volney American,*  DOB: 06-13-1946  PCP: Volney American, PA-C  DOS: 02/11/2018  Note by: Gillis Santa, MD  Service setting: Ambulatory outpatient  Specialty: Interventional Pain Management  Patient type: Established  Location: [facility] (AM[facility]) Pain Management Facility  Visit type: Interventional Procedure   Primary Reason for Visit: Interventional Pain Management Treatment. CC: Knee Pain; Neck Pain; and Back Pain  Procedure:  Anesthesia, Analgesia, Anxiolysis:  Type: Genicular Nerves Block (Superior-lateral, Superior-medial, and Inferior-medial Genicular Nerves) #1  CPT: 83419      Primary Purpose: Diagnostic Region: Lateral, Anterior, and Medial aspects of the knee joint, above and below the knee joint proper. Level: Superior and inferior to the knee joint. Target Area: For Genicular Nerve block(s), the targets are: the superior-lateral genicular nerve, located in the lateral distal portion of the femoral shaft as it curves to form the lateral epicondyle, in the region of the distal femoral metaphysis; the superior-medial genicular nerve, located in the medial distal portion of the femoral shaft as it curves to form the medial epicondyle; and the inferior-medial genicular nerve, located in the medial, proximal portion of the tibial shaft, as it curves to form the medial epicondyle, in the region of the proximal tibial metaphysis. Approach: Anterior, percutaneous, ipsilateral approach. Laterality: Bilateral Position: Modified Fowler's position with pillows under the targeted knee(s).  Type: Moderate (Conscious) Sedation combined with Local Anesthesia Indication(s): Analgesia and Anxiety Route: Intravenous (IV) IV Access: Secured Sedation: Meaningful verbal contact was maintained at all times during the procedure  Local Anesthetic: Lidocaine 1%   Indications: 1. Primary osteoarthritis of both knees   2.  Bilateral primary osteoarthritis of knee   3. Chronic pain of both knees   4. Chronic pain syndrome    Pain Score: Pre-procedure: 7 /10 Post-procedure: 4 /10  Pre-op Assessment:  Edwin Donovan is a 72 y.o. (year old), male patient, seen today for interventional treatment. He  has a past surgical history that includes Prostatectomy; Cystoscopy with stent placement (Right, 06/28/2016); Ureteroscopy (Right, 07/29/2016); Cystoscopy w/ ureteral stent placement (Right, 07/29/2016); Cystoscopy with biopsy (Right, 07/29/2016); Extracorporeal shock wave lithotripsy (Right, 12/04/2017); Cystoscopy/ureteroscopy/holmium laser/stent placement (Right, 12/26/2017); and Cystoscopy w/ retrogrades (Bilateral, 12/26/2017). Edwin Donovan has a current medication list which includes the following prescription(s): acetaminophen, albuterol, amlodipine, budesonide-formoterol, fluoxetine, lidocaine, lisinopril, metoprolol succinate, trazodone, and ibuprofen, and the following Facility-Administered Medications: fentanyl. His primarily concern today is the Knee Pain; Neck Pain; and Back Pain  Initial Vital Signs:  Pulse Rate: (!) 53 Temp: 98.2 F (36.8 C) Resp: 18 BP: (!) 163/87 SpO2: 96 %  BMI: Estimated body mass index is 35.57 kg/m as calculated from the following:   Height as of this encounter: 5\' 11"  (1.803 m).   Weight as of this encounter: 255 lb (115.7 kg).  Risk Assessment: Allergies: Reviewed. He has No Known Allergies.  Allergy Precautions: None required Coagulopathies: Reviewed. None identified.  Blood-thinner therapy: None at this time Active Infection(s): Reviewed. None identified. Edwin Donovan is afebrile  Site Confirmation: Edwin Donovan was asked to confirm the procedure and laterality before marking the site Procedure checklist: Completed Consent: Before the procedure and under the influence of no sedative(s), amnesic(s), or anxiolytics, the patient was informed of the treatment options, risks and  possible complications. To fulfill our ethical and legal obligations, as recommended by the American Medical Association's Code of Ethics, I have informed the patient of my clinical impression; the nature and purpose of  the treatment or procedure; the risks, benefits, and possible complications of the intervention; the alternatives, including doing nothing; the risk(s) and benefit(s) of the alternative treatment(s) or procedure(s); and the risk(s) and benefit(s) of doing nothing. The patient was provided information about the general risks and possible complications associated with the procedure. These may include, but are not limited to: failure to achieve desired goals, infection, bleeding, organ or nerve damage, allergic reactions, paralysis, and death. In addition, the patient was informed of those risks and complications associated to the procedure, such as failure to decrease pain; infection; bleeding; organ or nerve damage with subsequent damage to sensory, motor, and/or autonomic systems, resulting in permanent pain, numbness, and/or weakness of one or several areas of the body; allergic reactions; (i.e.: anaphylactic reaction); and/or death. Furthermore, the patient was informed of those risks and complications associated with the medications. These include, but are not limited to: allergic reactions (i.e.: anaphylactic or anaphylactoid reaction(s)); adrenal axis suppression; blood sugar elevation that in diabetics may result in ketoacidosis or comma; water retention that in patients with history of congestive heart failure may result in shortness of breath, pulmonary edema, and decompensation with resultant heart failure; weight gain; swelling or edema; medication-induced neural toxicity; particulate matter embolism and blood vessel occlusion with resultant organ, and/or nervous system infarction; and/or aseptic necrosis of one or more joints. Finally, the patient was informed that Medicine is not an  exact science; therefore, there is also the possibility of unforeseen or unpredictable risks and/or possible complications that may result in a catastrophic outcome. The patient indicated having understood very clearly. We have given the patient no guarantees and we have made no promises. Enough time was given to the patient to ask questions, all of which were answered to the patient's satisfaction. Mr. Sansom has indicated that he wanted to continue with the procedure. Attestation: I, the ordering provider, attest that I have discussed with the patient the benefits, risks, side-effects, alternatives, likelihood of achieving goals, and potential problems during recovery for the procedure that I have provided informed consent. Date  Time: 02/11/2018 10:57 AM  Pre-Procedure Preparation:  Monitoring: As per clinic protocol. Respiration, ETCO2, SpO2, BP, heart rate and rhythm monitor placed and checked for adequate function Safety Precautions: Patient was assessed for positional comfort and pressure points before starting the procedure. Time-out: I initiated and conducted the "Time-out" before starting the procedure, as per protocol. The patient was asked to participate by confirming the accuracy of the "Time Out" information. Verification of the correct person, site, and procedure were performed and confirmed by me, the nursing staff, and the patient. "Time-out" conducted as per Joint Commission's Universal Protocol (UP.01.01.01). Time: 1128  Description of Procedure Process:  Area Prepped: Entire knee area, from mid-thigh to mid-shin, lateral, anterior, and medial aspects. Prepping solution: ChloraPrep (2% chlorhexidine gluconate and 70% isopropyl alcohol) Safety Precautions: Aspiration looking for blood return was conducted prior to all injections. At no point did we inject any substances, as a needle was being advanced. No attempts were made at seeking any paresthesias. Safe injection practices and  needle disposal techniques used. Medications properly checked for expiration dates. SDV (single dose vial) medications used. Latex Allergy precautions taken.   Description of the Procedure: Protocol guidelines were followed. The patient was placed in position over the procedure table. The target area was identified and the area prepped in the usual manner. Skin desensitized using vapocoolant spray. Skin & deeper tissues infiltrated with local anesthetic. Appropriate amount of time allowed to  pass for local anesthetics to take effect. The procedure needles were then advanced to the target area. Proper needle placement secured. Negative aspiration confirmed. Solution injected in intermittent fashion, asking for systemic symptoms every 0.5cc of injectate. The needles were then removed and the area cleansed, making sure to leave some of the prepping solution back to take advantage of its long term bactericidal properties.  Vitals:   02/11/18 1145 02/11/18 1150 02/11/18 1200 02/11/18 1209  BP: (!) 161/88 (!) 165/81 (!) 158/82 (!) 163/90  Pulse: (!) 51 (!) 55 (!) 56 (!) 53  Resp: 15 18 16 14   Temp:  98.4 F (36.9 C)    SpO2: 99% 97% 99% 97%  Weight:      Height:        Start Time: 1132 hrs. End Time: 1145 hrs. Materials:  Needle(s) Type: Regular needle Gauge: 22G Length: 3.5-in Medication(s): Please see orders for medications and dosing details. 6 cc solution made of 5 cc of 0.2% ropivacaine, 1 cc of Decadron 10 mg/cc.  1 cc injected at each level. Imaging Guidance (Non-Spinal):  Type of Imaging Technique: Fluoroscopy Guidance (Non-Spinal) Indication(s): Assistance in needle guidance and placement for procedures requiring needle placement in or near specific anatomical locations not easily accessible without such assistance. Exposure Time: Please see nurses notes. Contrast: Before injecting any contrast, we confirmed that the patient did not have an allergy to iodine, shellfish, or radiological  contrast. Once satisfactory needle placement was completed at the desired level, radiological contrast was injected. Contrast injected under live fluoroscopy. No contrast complications. See chart for type and volume of contrast used. Fluoroscopic Guidance: I was personally present during the use of fluoroscopy. "Tunnel Vision Technique" used to obtain the best possible view of the target area. Parallax error corrected before commencing the procedure. "Direction-depth-direction" technique used to introduce the needle under continuous pulsed fluoroscopy. Once target was reached, antero-posterior, oblique, and lateral fluoroscopic projection used confirm needle placement in all planes. Images permanently stored in EMR. Interpretation: I personally interpreted the imaging intraoperatively. Adequate needle placement confirmed in multiple planes. Appropriate spread of contrast into desired area was observed. No evidence of afferent or efferent intravascular uptake. Permanent images saved into the patient's record.  Antibiotic Prophylaxis:   Anti-infectives (From admission, onward)   None     Indication(s): None identified 5 out of 5 strength bilateral lower extremity: Plantar flexion, dorsiflexion, knee flexion, knee extension.  Post-operative Assessment:  Post-procedure Vital Signs:  Pulse Rate: (!) 53 Temp: 98.4 F (36.9 C) Resp: 14 BP: (!) 163/90 SpO2: 97 %  EBL: None  Complications: No immediate post-treatment complications observed by team, or reported by patient.  Note: The patient tolerated the entire procedure well. A repeat set of vitals were taken after the procedure and the patient was kept under observation following institutional policy, for this type of procedure. Post-procedural neurological assessment was performed, showing return to baseline, prior to discharge. The patient was provided with post-procedure discharge instructions, including a section on how to identify potential  problems. Should any problems arise concerning this procedure, the patient was given instructions to immediately contact us, at any time, without hesitation. In any case, we plan to contact the patient by telephone for a follow-up status report regarding this interventional procedure.  Comments:  No additional relevant information. 5 out of 5 strength bilateral lower extremity: Plantar flexion, dorsiflexion, knee flexion, knee extension.  Plan of Care    Imaging Orders     DG C-Arm 1-60 Min-No Report  Procedure Orders    No procedure(s) ordered today    Medications ordered for procedure: Meds ordered this encounter  Medications  . lidocaine (XYLOCAINE) 1 % (with pres) injection 10 mL  . ropivacaine (PF) 2 mg/mL (0.2%) (NAROPIN) injection 10 mL  . dexamethasone (DECADRON) injection 10 mg  . lactated ringers infusion 1,000 mL  . fentaNYL (SUBLIMAZE) injection 25-100 mcg    Make sure Narcan is available in the pyxis when using this medication. In the event of respiratory depression (RR< 8/min): Titrate NARCAN (naloxone) in increments of 0.1 to 0.2 mg IV at 2-3 minute intervals, until desired degree of reversal.   Medications administered: We administered lidocaine, ropivacaine (PF) 2 mg/mL (0.2%), dexamethasone, lactated ringers, and fentaNYL.  See the medical record for exact dosing, route, and time of administration.  New Prescriptions   No medications on file   Disposition: Discharge home  Discharge Date & Time: 02/11/2018; 1215 hrs.   Physician-requested Follow-up: Return in about 1 month (around 03/11/2018).  Future Appointments  Date Time Provider [place]  03/16/2018  2:00 PM Gillis Santa, MD [facility] None  04/15/2018  3:00 PM Nyra Capes [facility]  08/12/2018  1:15 PM Stoioff, Ronda Fairly, MD [facility] None   Primary Care Physician: Volney American, PA-C Location: [facility] Note by: Gillis Santa, MD Date:  02/11/2018; Time: 1:59 PM  Disclaimer:  Medicine is not an exact science. The only guarantee in medicine is that nothing is guaranteed. It is important to note that the decision to proceed with this intervention was based on the information collected from the patient. The Data and conclusions were drawn from the patient's questionnaire, the interview, and the physical examination. Because the information was provided in large part by the patient, it cannot be guaranteed that it has not been purposely or unconsciously manipulated. Every effort has been made to obtain as much relevant data as possible for this evaluation. It is important to note that the conclusions that lead to this procedure are derived in large part from the available data. Always take into account that the treatment will also be dependent on availability of resources and existing treatment guidelines, considered by other Pain Management Practitioners as being common knowledge and practice, at the time of the intervention. For Medico-Legal purposes, it is also important to point out that variation in procedural techniques and pharmacological choices are the acceptable norm. The indications, contraindications, technique, and results of the above procedure should only be interpreted and judged by a Board-Certified Interventional Pain Specialist with extensive familiarity and expertise in the same exact procedure and technique.

## 2018-02-11 NOTE — Patient Instructions (Signed)

## 2018-02-12 ENCOUNTER — Telehealth: Payer: Self-pay | Admitting: *Deleted

## 2018-02-12 NOTE — Telephone Encounter (Signed)
Spoke with patient re; procedure on yesterday.  Verbalizes no questions or concerns.  

## 2018-03-16 ENCOUNTER — Ambulatory Visit: Payer: Medicare Other | Admitting: Student in an Organized Health Care Education/Training Program

## 2018-04-15 ENCOUNTER — Encounter: Payer: Medicare Other | Admitting: Family Medicine

## 2018-04-15 NOTE — Telephone Encounter (Signed)
error 

## 2018-07-03 IMAGING — CR DG ABDOMEN 1V
1 series · 3 of 3 positions shown · non-contrast
Comparison: December 04, 2017

CLINICAL DATA: Right flank pain.  Recent lithotripsy

EXAM:
ABDOMEN - 1 VIEW

[Series 1: dg abd 1 view · 0.14mm/px · 3 of 3 slices shown]
[im 1/3]
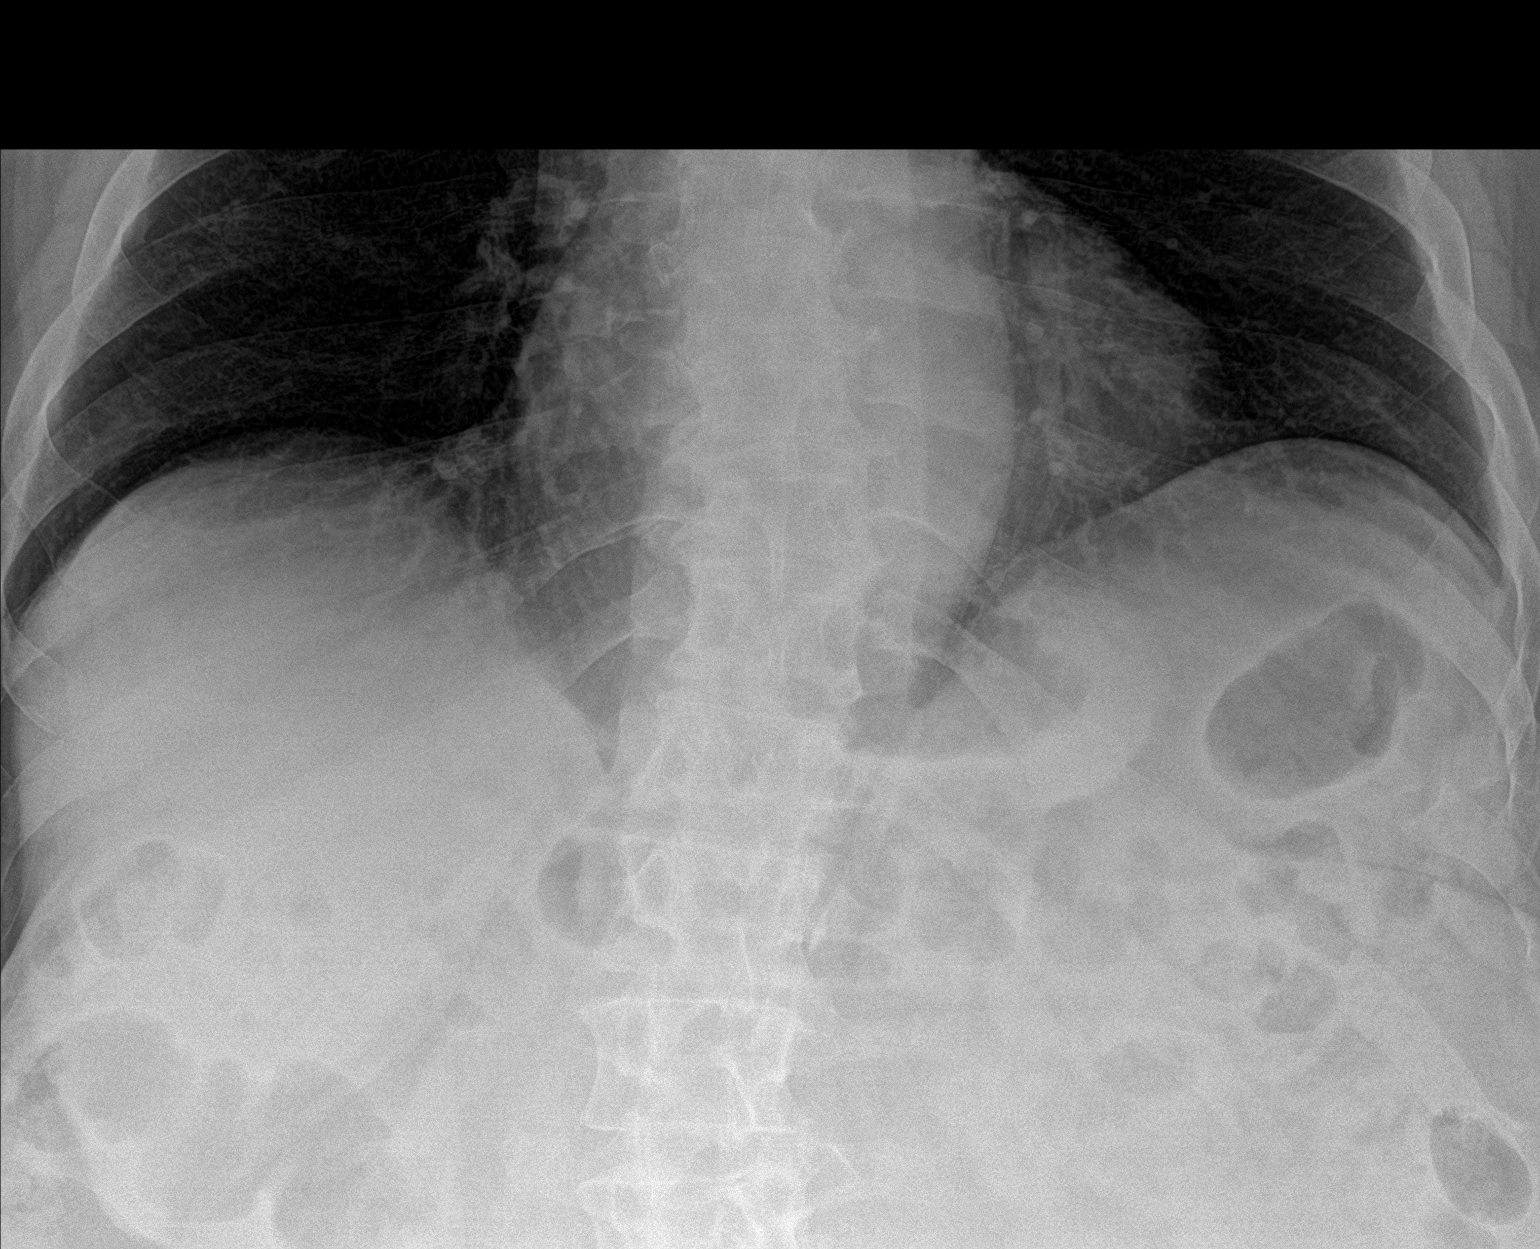
[im 2/3]
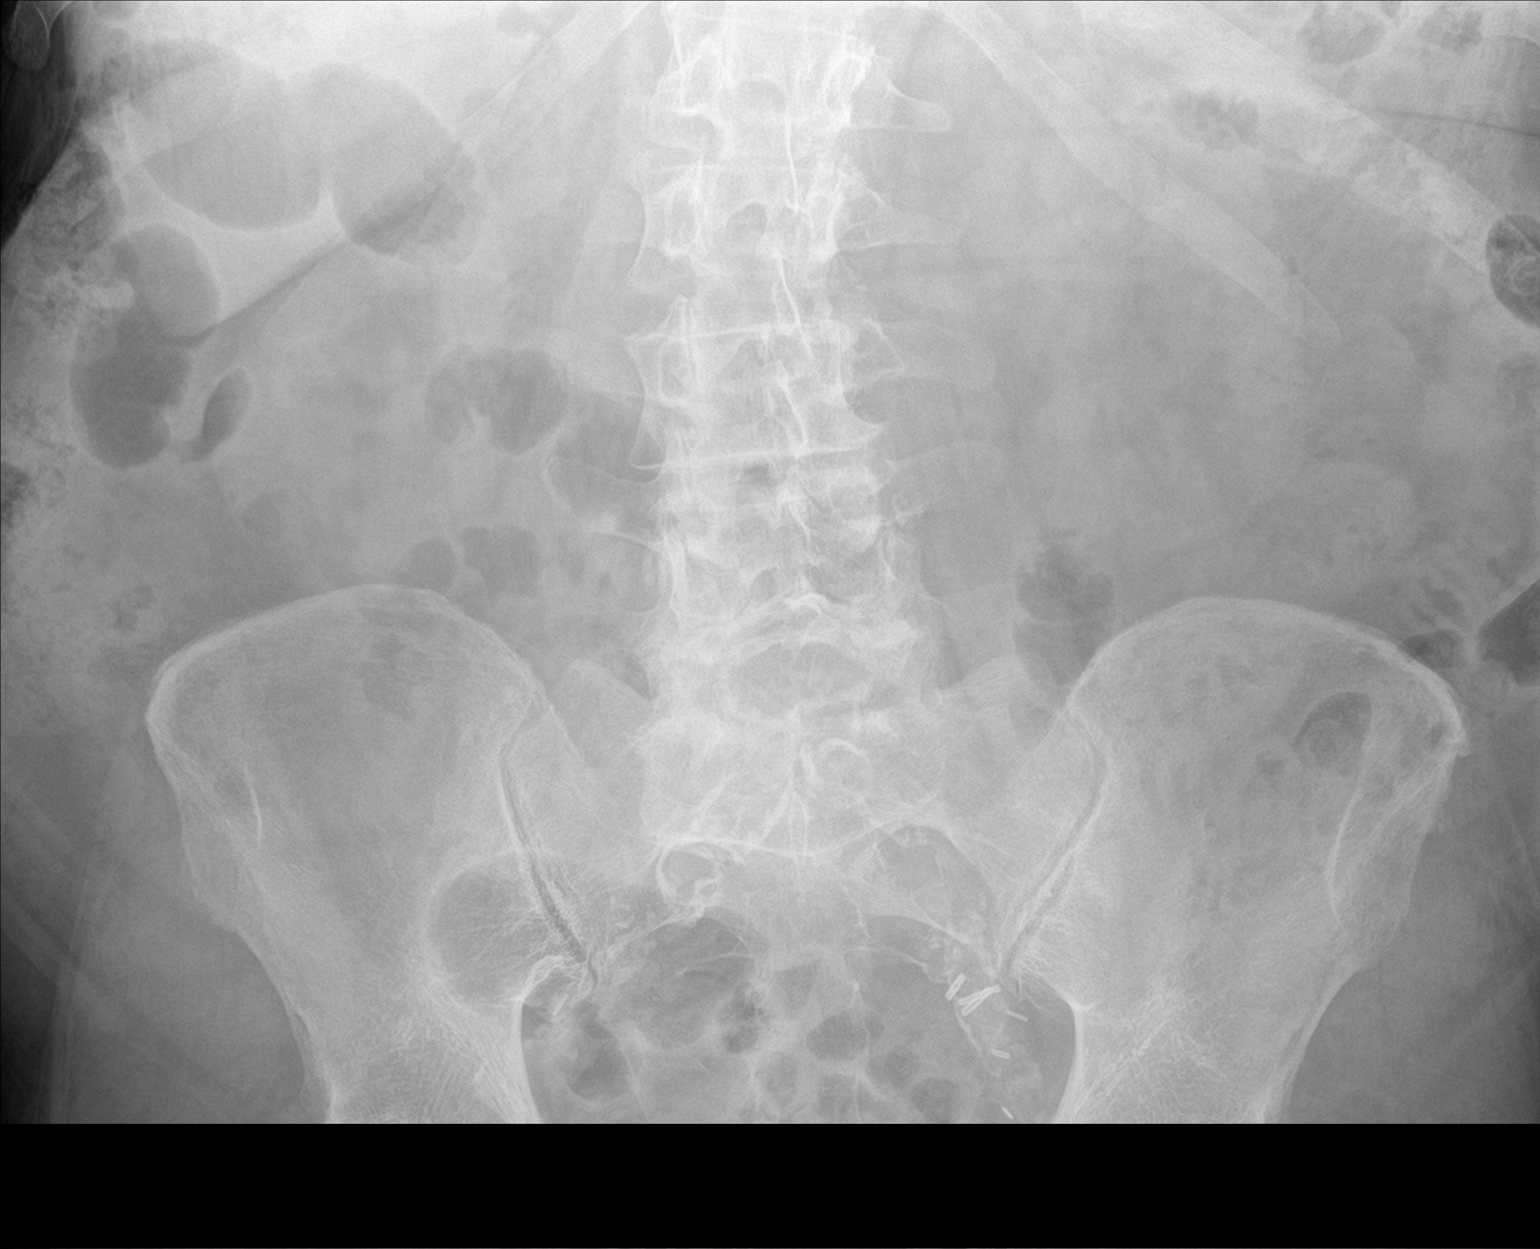
[im 3/3]
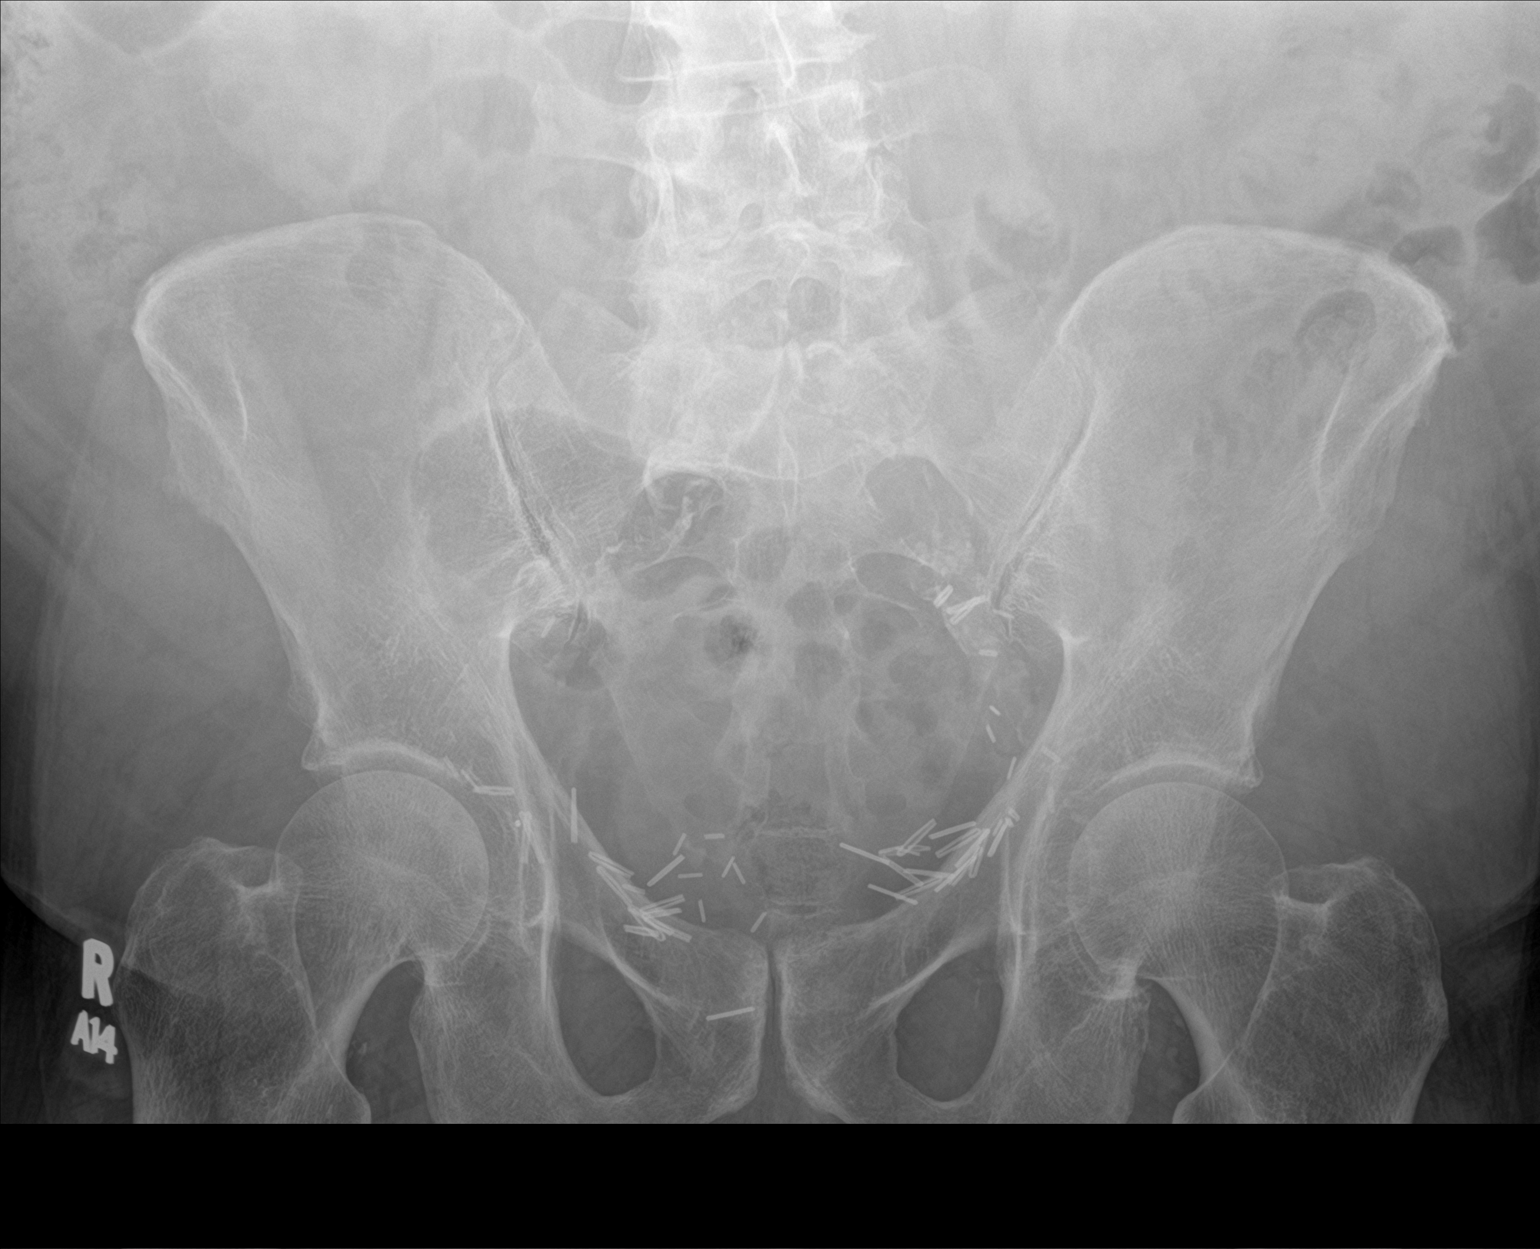

[3 of 3 positions shown; findings below may reference images not displayed]

FINDINGS: The previously noted calcification in the lower right pelvis is no
longer evident. Currently, there are no appreciable abnormal
calcifications. There are multiple surgical clips in the pelvis.

There is moderate stool in the colon. No bowel dilatation or
air-fluid level to suggest bowel obstruction. No free air. There is
lumbar dextroscoliosis. Lung bases are clear.
IMPRESSION: Previously noted distal right ureteral calculus no longer evident.
Currently no abnormal calcifications are evident. Multiple surgical
clips noted in pelvis. No bowel obstruction or free air appreciable.

## 2018-08-04 ENCOUNTER — Encounter: Payer: Self-pay | Admitting: Family Medicine

## 2018-08-04 DIAGNOSIS — Z8546 Personal history of malignant neoplasm of prostate: Secondary | ICD-10-CM | POA: Insufficient documentation

## 2018-08-12 ENCOUNTER — Encounter: Payer: Self-pay | Admitting: Urology

## 2018-08-12 ENCOUNTER — Ambulatory Visit: Payer: Medicare Other | Admitting: Urology

## 2018-09-23 ENCOUNTER — Ambulatory Visit: Payer: Medicare Other | Admitting: Urology

## 2018-09-23 ENCOUNTER — Encounter: Payer: Self-pay | Admitting: Urology

## 2018-09-23 ENCOUNTER — Ambulatory Visit
Admission: RE | Admit: 2018-09-23 | Discharge: 2018-09-23 | Disposition: A | Discharge status: Home / Self Care | Payer: Medicare Other | Source: Ambulatory Visit | Attending: Urology | Admitting: Urology

## 2018-09-23 VITALS — BP 158/84 | HR 79 | Ht 71.0 in | Wt 285.7 lb

## 2018-09-23 DIAGNOSIS — N200 Calculus of kidney: Secondary | ICD-10-CM

## 2018-09-23 DIAGNOSIS — M419 Scoliosis, unspecified: Secondary | ICD-10-CM | POA: Diagnosis not present

## 2018-09-23 DIAGNOSIS — R109 Unspecified abdominal pain: Secondary | ICD-10-CM | POA: Diagnosis not present

## 2018-09-23 NOTE — Progress Notes (Signed)
09/23/2018 1:56 PM   Edwin Donovan. [birth year] [id number]  Referring provider: Volney American, PA-C [address] [place], [place] [address]  Chief Complaint  Patient presents with  . Nephrolithiasis    HPI: 72 year old male presents for follow-up of stone disease.  He is status post ureteroscopic removal of a right distal ureteral calculus in February 2019 after failed shockwave lithotripsy.  Stone analysis was mixed calcium oxalate.  A metabolic evaluation was recommended however he never collected.  He presents today with a 3-week history of right flank pain.  The severity is mild-moderate without identifiable precipitating, aggravating or alleviating factors.  Denies radiation of pain or nausea, vomiting, lower urinary tract symptoms, hematuria.  His prior CT in January showed bilateral, nonobstructing renal calculi.  He states his pain is similar to previous episodes of stone pain though not as severe.   PMH: Past Medical History:  Diagnosis Date  . Anxiety    ptsd  . Arthritis   . Cancer ([place])    PROSTATE  . Chronic kidney disease    stones  . COPD (chronic obstructive pulmonary disease) ([place])   . Dysrhythmia   . History of kidney stones   . Hypertension   . Kidney stone   . Pneumonia    06/27/16  . Shortness of breath dyspnea     Surgical History: Past Surgical History:  Procedure Laterality Date  . CYSTOSCOPY W/ RETROGRADES Bilateral 12/26/2017   Procedure: CYSTOSCOPY WITH RETROGRADE PYELOGRAM;  Surgeon: Abbie Sons, MD;  Location: [facility];  Service: Urology;  Laterality: Bilateral;  . CYSTOSCOPY W/ URETERAL STENT PLACEMENT Right 07/29/2016   Procedure: CYSTOSCOPY WITH STENT REPLACEMENT;  Surgeon: Hollice Espy, MD;  Location: [facility];  Service: Urology;  Laterality: Right;  . CYSTOSCOPY WITH BIOPSY Right 07/29/2016   Procedure: CYSTOSCOPY WITH BIOPSY;  Surgeon: Hollice Espy, MD;  Location: [facility];  Service: Urology;  Laterality: Right;  .  CYSTOSCOPY WITH STENT PLACEMENT Right 06/28/2016   Procedure: CYSTOSCOPY WITH STENT PLACEMENT;  Surgeon: Hollice Espy, MD;  Location: [facility];  Service: Urology;  Laterality: Right;  . CYSTOSCOPY/URETEROSCOPY/HOLMIUM LASER/STENT PLACEMENT Right 12/26/2017   Procedure: CYSTOSCOPY/URETEROSCOPY/HOLMIUM LASER/STENT PLACEMENT;  Surgeon: Abbie Sons, MD;  Location: [facility];  Service: Urology;  Laterality: Right;  . EXTRACORPOREAL SHOCK WAVE LITHOTRIPSY Right 12/04/2017   Procedure: EXTRACORPOREAL SHOCK WAVE LITHOTRIPSY (ESWL);  Surgeon: Hollice Espy, MD;  Location: [facility];  Service: Urology;  Laterality: Right;  . PROSTATECTOMY    . URETEROSCOPY Right 07/29/2016   Procedure: URETEROSCOPY;  Surgeon: Hollice Espy, MD;  Location: [facility];  Service: Urology;  Laterality: Right;    Home Medications:  Allergies as of 09/23/2018   No Known Allergies     Medication List        Accurate as of 09/23/18  1:56 PM. Always use your most recent med list.          acetaminophen 500 MG tablet Commonly known as:  TYLENOL Take 500 mg by mouth every 6 (six) hours as needed.   albuterol 108 (90 Base) MCG/ACT inhaler Commonly known as:  PROVENTIL HFA;VENTOLIN HFA Inhale 1-2 puffs into the lungs every 6 (six) hours as needed for wheezing or shortness of breath.   amLODipine 10 MG tablet Commonly known as:  NORVASC Take 10 mg by mouth daily.   budesonide-formoterol 160-4.5 MCG/ACT inhaler Commonly known as:  SYMBICORT Inhale 2 puffs into the lungs 2 (two) times daily.   FLUoxetine 20 MG capsule Commonly  known as:  PROZAC Take 40 mg by mouth daily.   ibuprofen 600 MG tablet Commonly known as:  ADVIL,MOTRIN Take 1 tablet (600 mg total) by mouth every 8 (eight) hours as needed.   lidocaine 5 % Commonly known as:  LIDODERM Place 2 patches onto the skin daily. Remove & Discard patch within 12 hours or as directed by MD   lisinopril 10 MG tablet Commonly known as:  PRINIVIL,ZESTRIL Take 1  tablet (10 mg total) by mouth daily.   metoprolol succinate 25 MG 24 hr tablet Commonly known as:  TOPROL-XL Take 12.5 mg by mouth daily.   traZODone 100 MG tablet Commonly known as:  DESYREL Take 100 mg by mouth at bedtime as needed for sleep.       Allergies: No Known Allergies  Family History: Family History  Problem Relation Age of Onset  . Stroke Mother   . Heart attack Father   . Lung cancer Father   . Kidney disease Father   . Prostate cancer Neg Hx   . Bladder Cancer Neg Hx     Social History:  reports that he has been smoking cigarettes. He has been smoking about 0.50 packs per day. He has never used smokeless tobacco. He reports that he drinks about 14.0 standard drinks of alcohol per week. He reports that he has current or past drug history. Drug: Marijuana.  ROS: UROLOGY Frequent Urination?: No Hard to postpone urination?: No Burning/pain with urination?: No Get up at night to urinate?: No Leakage of urine?: No Urine stream starts and stops?: No Trouble starting stream?: No Do you have to strain to urinate?: No Blood in urine?: No Urinary tract infection?: No Sexually transmitted disease?: No Injury to kidneys or bladder?: No Painful intercourse?: No Weak stream?: No Erection problems?: No Penile pain?: No  Gastrointestinal Nausea?: No Vomiting?: No Indigestion/heartburn?: No Diarrhea?: No Constipation?: No  Constitutional Fever: No Night sweats?: No Weight loss?: No Fatigue?: No  Skin Skin rash/lesions?: No Itching?: No  Eyes Blurred vision?: No Double vision?: No  Ears/Nose/Throat Sore throat?: No Sinus problems?: No  Hematologic/Lymphatic Swollen glands?: No Easy bruising?: No  Cardiovascular Leg swelling?: No Chest pain?: No  Respiratory Cough?: No Shortness of breath?: No  Endocrine Excessive thirst?: No  Musculoskeletal Back pain?: No Joint pain?: No  Neurological Headaches?: No Dizziness?:  No  Psychologic Depression?: No Anxiety?: No  Physical Exam: BP (!) 158/84 (BP Location: Left Arm, Patient Position: Sitting, Cuff Size: Large)   Pulse 79   Ht 5\' 11"  (1.803 m)   Wt 285 lb 11.2 oz (129.6 kg)   BMI 39.85 kg/m   Constitutional:  Alert and oriented, No acute distress. HEENT: [place] AT, moist mucus membranes.  Trachea midline, no masses. Cardiovascular: No clubbing, cyanosis, or edema.  RRR Respiratory: Normal respiratory effort, no increased work of breathing.  Clear GI: Abdomen is soft, nontender, nondistended, no abdominal masses GU: No CVA tenderness Lymph: No cervical or inguinal lymphadenopathy. Skin: No rashes, bruises or suspicious lesions. Neurologic: Grossly intact, no focal deficits, moving all 4 extremities. Psychiatric: Normal mood and affect.  Imaging: KUB performed today was reviewed and there is a moderate amount of stool and bowel gas obscuring the renal outlines.  No calcifications suspicious for urinary tract stones are identified.   Assessment & Plan:   72 year old male with flank pain and recurrent stone disease.  No definite calculus seen on KUB.  A stone protocol CT was ordered and he will be notified with the  results.   Abbie Sons, [place] [address] [place], [place] [address] [phone]

## 2018-12-02 ENCOUNTER — Ambulatory Visit
Admission: RE | Admit: 2018-12-02 | Discharge: 2018-12-02 | Disposition: A | Discharge status: Home / Self Care | Payer: Medicare Other | Source: Ambulatory Visit | Attending: Urology | Admitting: Urology

## 2018-12-02 DIAGNOSIS — N200 Calculus of kidney: Secondary | ICD-10-CM | POA: Insufficient documentation

## 2018-12-02 DIAGNOSIS — R109 Unspecified abdominal pain: Secondary | ICD-10-CM | POA: Insufficient documentation

## 2018-12-06 ENCOUNTER — Other Ambulatory Visit: Payer: Self-pay | Admitting: Urology

## 2018-12-06 DIAGNOSIS — R3129 Other microscopic hematuria: Secondary | ICD-10-CM

## 2018-12-07 ENCOUNTER — Telehealth: Payer: Self-pay

## 2018-12-07 NOTE — Telephone Encounter (Signed)
Informed patient of results and recommendations. 

## 2018-12-07 NOTE — Telephone Encounter (Signed)
-----   Message from Abbie Sons, MD sent at 12/06/2018  9:21 AM EST ----- Noncontrast CT shows no evidence of an obstructing stone.  There was some distention of the right renal pelvis and question of blockage.  Recommend a CT urogram for further evaluation.  Order was entered.  Will call with results.

## 2018-12-31 ENCOUNTER — Telehealth: Payer: Self-pay | Admitting: Urology

## 2018-12-31 NOTE — Telephone Encounter (Signed)
Patient had a non-contrast CT scan on 1/15.  Dr. Bernardo Heater recommended a CT Hematuria workup if the patient is still having symptoms.  Elizabeth Palau spoke to the patient back in January.  Patient called the office today to report that he is still having pain and would like to proceed with the CT scan recommended by Dr. Bernardo Heater.  Can you please check the form and the authorization?

## 2018-12-31 NOTE — Telephone Encounter (Signed)
Looks like they have reached out to him to try and scheduled, left messages. I will have them call him again.   Sharyn Lull

## 2019-01-05 NOTE — Telephone Encounter (Signed)
Yes

## 2019-01-05 NOTE — Telephone Encounter (Signed)
Patient is having his CT scan 01-06-19 are you going to call him with the results?    Sharyn Lull

## 2019-01-06 ENCOUNTER — Ambulatory Visit
Admission: RE | Admit: 2019-01-06 | Discharge: 2019-01-06 | Disposition: A | Discharge status: Home / Self Care | Payer: Medicare Other | Source: Ambulatory Visit | Attending: Urology | Admitting: Urology

## 2019-01-06 DIAGNOSIS — R3129 Other microscopic hematuria: Secondary | ICD-10-CM | POA: Diagnosis not present

## 2019-01-06 DIAGNOSIS — N281 Cyst of kidney, acquired: Secondary | ICD-10-CM | POA: Diagnosis not present

## 2019-01-06 LAB — POCT I-STAT CREATININE: Creatinine, Ser: 0.9 mg/dL (ref 0.61–1.24)

## 2019-01-06 MED ORDER — IOHEXOL 300 MG/ML  SOLN
125.0000 mL | Freq: Once | INTRAMUSCULAR | Status: AC | PRN
  Administered 2019-01-06: 125 mL via INTRAVENOUS

## 2019-01-12 NOTE — Telephone Encounter (Signed)
Patient is calling asking if you can call him with his results please   Sharyn Lull

## 2019-01-14 NOTE — Telephone Encounter (Signed)
Call patient to give CT results; left message on VM to call back

## 2019-01-15 NOTE — Telephone Encounter (Signed)
Pt returning call for CT results, Please return call,  Pt states he is upset that it is taking too long to get results. States he was sleeping when he missed previous call. Asking if he should go to another urologist to get his kidney fixed. Advised pt to please give Dr. Bernardo Heater a chance to go over results with him. He asks that someone call him today, sooner rather than later. Please advise. Thanks.

## 2019-01-15 NOTE — Telephone Encounter (Signed)
CT findings were discussed with Edwin Donovan.  I recommended scheduling right retrograde pyelogram, diagnostic right ureteroscopy with possible biopsy and right ureteral stent placement.  The procedure was discussed in detail.  The need for a postoperative ureteral stent was also discussed.  He desires to proceed.

## 2019-01-25 ENCOUNTER — Emergency Department
Admission: EM | Admit: 2019-01-25 | Discharge: 2019-01-25 | Disposition: A | Discharge status: Home / Self Care | Payer: Medicare Other | Attending: Emergency Medicine | Admitting: Emergency Medicine

## 2019-01-25 ENCOUNTER — Other Ambulatory Visit: Payer: Self-pay

## 2019-01-25 DIAGNOSIS — R109 Unspecified abdominal pain: Secondary | ICD-10-CM | POA: Diagnosis present

## 2019-01-25 DIAGNOSIS — J449 Chronic obstructive pulmonary disease, unspecified: Secondary | ICD-10-CM | POA: Insufficient documentation

## 2019-01-25 DIAGNOSIS — I129 Hypertensive chronic kidney disease with stage 1 through stage 4 chronic kidney disease, or unspecified chronic kidney disease: Secondary | ICD-10-CM | POA: Insufficient documentation

## 2019-01-25 DIAGNOSIS — F17210 Nicotine dependence, cigarettes, uncomplicated: Secondary | ICD-10-CM | POA: Insufficient documentation

## 2019-01-25 DIAGNOSIS — Z8546 Personal history of malignant neoplasm of prostate: Secondary | ICD-10-CM | POA: Insufficient documentation

## 2019-01-25 DIAGNOSIS — N189 Chronic kidney disease, unspecified: Secondary | ICD-10-CM | POA: Diagnosis not present

## 2019-01-25 DIAGNOSIS — N12 Tubulo-interstitial nephritis, not specified as acute or chronic: Secondary | ICD-10-CM

## 2019-01-25 LAB — URINALYSIS, COMPLETE (UACMP) WITH MICROSCOPIC
Bacteria, UA: NONE SEEN
Bilirubin Urine: NEGATIVE
GLUCOSE, UA: NEGATIVE mg/dL
KETONES UR: NEGATIVE mg/dL
NITRITE: POSITIVE — AB
PH: 6 (ref 5.0–8.0)
Protein, ur: 100 mg/dL — AB
Specific Gravity, Urine: 1.013 (ref 1.005–1.030)
WBC, UA: >50 WBC/hpf — ABNORMAL HIGH (ref 0–5)

## 2019-01-25 LAB — COMPREHENSIVE METABOLIC PANEL
ALT: 16 U/L (ref 0–44)
AST: 17 U/L (ref 15–41)
Albumin: 4.1 g/dL (ref 3.5–5.0)
Alkaline Phosphatase: 55 U/L (ref 38–126)
Anion gap: 8 (ref 5–15)
BILIRUBIN TOTAL: 1 mg/dL (ref 0.3–1.2)
BUN: 26 mg/dL — AB (ref 8–23)
CO2: 24 mmol/L (ref 22–32)
Calcium: 9.2 mg/dL (ref 8.9–10.3)
Chloride: 105 mmol/L (ref 98–111)
Creatinine, Ser: 0.99 mg/dL (ref 0.61–1.24)
Glucose, Bld: 130 mg/dL — ABNORMAL HIGH (ref 70–99)
POTASSIUM: 4.4 mmol/L (ref 3.5–5.1)
Sodium: 137 mmol/L (ref 135–145)
TOTAL PROTEIN: 7.4 g/dL (ref 6.5–8.1)

## 2019-01-25 LAB — CBC
HCT: 45.3 % (ref 39.0–52.0)
Hemoglobin: 15.2 g/dL (ref 13.0–17.0)
MCH: 33.5 pg (ref 26.0–34.0)
MCHC: 33.6 g/dL (ref 30.0–36.0)
MCV: 99.8 fL (ref 80.0–100.0)
NRBC: 0 % (ref 0.0–0.2)
Platelets: 178 10*3/uL (ref 150–400)
RBC: 4.54 MIL/uL (ref 4.22–5.81)
RDW: 13.6 % (ref 11.5–15.5)
WBC: 11.4 10*3/uL — AB (ref 4.0–10.5)

## 2019-01-25 LAB — LIPASE, BLOOD: Lipase: 27 U/L (ref 11–51)

## 2019-01-25 MED ORDER — CIPROFLOXACIN HCL 500 MG PO TABS: 500.0000 mg | ORAL_TABLET | Freq: Two times a day (BID) | ORAL | 0 refills | Status: AC

## 2019-01-25 MED ORDER — SODIUM CHLORIDE 0.9 % IV SOLN
Freq: Once | INTRAVENOUS | Status: AC
  Administered 2019-01-25: 12:00:00 via INTRAVENOUS

## 2019-01-25 MED ORDER — SODIUM CHLORIDE 0.9 % IV SOLN
1.0000 g | Freq: Once | INTRAVENOUS | Status: AC
  Administered 2019-01-25: 1 g via INTRAVENOUS
  Filled 2019-01-25: qty 10

## 2019-01-25 MED ORDER — HYDROMORPHONE HCL 1 MG/ML IJ SOLN
0.5000 mg | Freq: Once | INTRAMUSCULAR | Status: AC
  Administered 2019-01-25: 0.5 mg via INTRAVENOUS
  Filled 2019-01-25: qty 1

## 2019-01-25 MED ORDER — OXYCODONE-ACETAMINOPHEN 5-325 MG PO TABS: 1.0000 | ORAL_TABLET | Freq: Three times a day (TID) | ORAL | 0 refills | Status: DC | PRN

## 2019-01-25 NOTE — ED Notes (Signed)
Pt pants wet. RN offered to change pt into new brief and blue scrub pants. Pt refused stating his daughter was going to take him straight home to change.

## 2019-01-25 NOTE — ED Triage Notes (Signed)
Bilateral flank pain X 3 days, radiating to abdomen. States he has tumor in right side and has " a procedure 02/09/19" to remove. Denies NVD. Pt alert and oriented X4, active, cooperative, pt in NAD. RR even and unlabored, color WNL.

## 2019-01-25 NOTE — ED Provider Notes (Addendum)
[facility] Emergency Department Provider Note       Time seen: ----------------------------------------- 11:31 AM on 01/25/2019 -----------------------------------------   I have reviewed the triage vital signs and the nursing notes.  HISTORY   Chief Complaint Flank Pain and Abdominal Pain    HPI Edwin Popoff. is a 73 y.o. male with a history of anxiety, chronic kidney disease, COPD, hypertension who presents to the ED for bilateral flank pain for the past 3 days.  Pain radiates into his abdomen.  He reports he has a tumor on the right side of his abdomen and has a procedure scheduled in 2 weeks to biopsy and possibly place a stent.  He has had antibiotics recently for similar.  He denies nausea, vomiting or diarrhea.  Past Medical History:  Diagnosis Date  . Anxiety    ptsd  . Arthritis   . Cancer ([place])    PROSTATE  . Chronic kidney disease    stones  . COPD (chronic obstructive pulmonary disease) ([place])   . Dysrhythmia   . History of kidney stones   . Hypertension   . Kidney stone   . Pneumonia    06/27/16  . Shortness of breath dyspnea     Patient Active Problem List   Diagnosis Date Noted  . History of prostate cancer 08/04/2018  . Cervicalgia 12/25/2017  . Spondylosis of cervical region without myelopathy or radiculopathy 12/25/2017  . Primary osteoarthritis of both knees 12/25/2017  . Lumbar degenerative disc disease 12/25/2017  . Chronic pain syndrome 12/25/2017  . COPD (chronic obstructive pulmonary disease) ([place]) 11/03/2017  . Hypertension 11/03/2017  . Insomnia 11/03/2017  . Anxiety 11/03/2017  . Sepsis ([place]) 06/28/2016  . Right ureteral stone     Past Surgical History:  Procedure Laterality Date  . CYSTOSCOPY W/ RETROGRADES Bilateral 12/26/2017   Procedure: CYSTOSCOPY WITH RETROGRADE PYELOGRAM;  Surgeon: Abbie Sons, MD;  Location: [facility];  Service: Urology;  Laterality: Bilateral;  . CYSTOSCOPY W/ URETERAL  STENT PLACEMENT Right 07/29/2016   Procedure: CYSTOSCOPY WITH STENT REPLACEMENT;  Surgeon: Hollice Espy, MD;  Location: [facility];  Service: Urology;  Laterality: Right;  . CYSTOSCOPY WITH BIOPSY Right 07/29/2016   Procedure: CYSTOSCOPY WITH BIOPSY;  Surgeon: Hollice Espy, MD;  Location: [facility];  Service: Urology;  Laterality: Right;  . CYSTOSCOPY WITH STENT PLACEMENT Right 06/28/2016   Procedure: CYSTOSCOPY WITH STENT PLACEMENT;  Surgeon: Hollice Espy, MD;  Location: [facility];  Service: Urology;  Laterality: Right;  . CYSTOSCOPY/URETEROSCOPY/HOLMIUM LASER/STENT PLACEMENT Right 12/26/2017   Procedure: CYSTOSCOPY/URETEROSCOPY/HOLMIUM LASER/STENT PLACEMENT;  Surgeon: Abbie Sons, MD;  Location: [facility];  Service: Urology;  Laterality: Right;  . EXTRACORPOREAL SHOCK WAVE LITHOTRIPSY Right 12/04/2017   Procedure: EXTRACORPOREAL SHOCK WAVE LITHOTRIPSY (ESWL);  Surgeon: Hollice Espy, MD;  Location: [facility];  Service: Urology;  Laterality: Right;  . PROSTATECTOMY    . URETEROSCOPY Right 07/29/2016   Procedure: URETEROSCOPY;  Surgeon: Hollice Espy, MD;  Location: [facility];  Service: Urology;  Laterality: Right;    Allergies Patient has no known allergies.  Social History Social History   Tobacco Use  . Smoking status: Current Some Day Smoker    Packs/day: 0.50    Types: Cigarettes  . Smokeless tobacco: Never Used  Substance Use Topics  . Alcohol use: Yes    Alcohol/week: 14.0 standard drinks    Types: 14 Cans of beer per week  . Drug use: Yes    Types: Marijuana    Comment: "sometimes,  not very often"    Review of Systems Constitutional: Negative for fever. Cardiovascular: Negative for chest pain. Respiratory: Negative for shortness of breath. Gastrointestinal: Positive for flank pain Musculoskeletal: Negative for back pain. Skin: Negative for rash. Neurological: Negative for headaches, focal weakness or numbness.  All systems negative/normal/unremarkable except as  stated in the HPI  ____________________________________________   PHYSICAL EXAM:  VITAL SIGNS: ED Triage Vitals  Enc Vitals Group     BP 01/25/19 0939 (!) 145/91     Pulse Rate 01/25/19 0939 78     Resp 01/25/19 0939 18     Temp 01/25/19 0939 98.3 F (36.8 C)     Temp Source 01/25/19 0939 Oral     SpO2 01/25/19 0939 100 %     Weight 01/25/19 0940 290 lb (131.5 kg)     Height 01/25/19 0940 5\' 11"  (1.803 m)     Head Circumference --      Peak Flow --      Pain Score 01/25/19 0940 10     Pain Loc --      Pain Edu? --      Excl. in [place]? --    Constitutional: Alert and oriented. Well appearing and in no distress. Eyes: Conjunctivae are normal. Normal extraocular movements. ENT      Head: Normocephalic and atraumatic.      Nose: No congestion/rhinnorhea.      Mouth/Throat: Mucous membranes are moist.      Neck: No stridor. Cardiovascular: Normal rate, regular rhythm. No murmurs, rubs, or gallops. Respiratory: Normal respiratory effort without tachypnea nor retractions. Breath sounds are clear and equal bilaterally. No wheezes/rales/rhonchi. Gastrointestinal: Mild flank tenderness, no rebound or guarding.  Normal bowel sounds. Musculoskeletal: Nontender with normal range of motion in extremities. No lower extremity tenderness nor edema. Neurologic:  Normal speech and language. No gross focal neurologic deficits are appreciated.  Skin:  Skin is warm, dry and intact. No rash noted. Psychiatric: Mood and affect are normal. Speech and behavior are normal.  ___________________________________________  ED COURSE:  As part of my medical decision making, I reviewed the following data within the [place] History obtained from family if available, nursing notes, old chart and ekg, as well as notes from prior ED visits. Patient presented for flank pain, we will assess with labs and imaging as indicated at this time.    Procedures ____________________________________________   LABS (pertinent positives/negatives)  Labs Reviewed  COMPREHENSIVE METABOLIC PANEL - Abnormal; Notable for the following components:      Result Value   Glucose, Bld 130 (*)    BUN 26 (*)    All other components within normal limits  CBC - Abnormal; Notable for the following components:   WBC 11.4 (*)    All other components within normal limits  URINALYSIS, COMPLETE (UACMP) WITH MICROSCOPIC - Abnormal; Notable for the following components:   Color, Urine YELLOW (*)    APPearance CLOUDY (*)    Hgb urine dipstick MODERATE (*)    Protein, ur 100 (*)    Nitrite POSITIVE (*)    Leukocytes,Ua LARGE (*)    WBC, UA >50 (*)    All other components within normal limits  URINE CULTURE  LIPASE, BLOOD   ____________________________________________   DIFFERENTIAL DIAGNOSIS   Transitional cell carcinoma, cancer, UTI, pyelonephritis, renal colic  FINAL ASSESSMENT AND PLAN  Pyelonephritis   Plan: The patient had presented for flank pain. Patient's labs did reveal significant urinary tract infection and we have  sent a urine culture as well as given IV Rocephin.  I have discussed with urology who did not recommend any further treatment at this time.  He will be discharged home with Cipro, pain medicine and outpatient follow-up as already scheduled.   Laurence Aly, MD    Note: This note was generated in part or whole with voice recognition software. Voice recognition is usually quite accurate but there are transcription errors that can and very often do occur. I apologize for any typographical errors that were not detected and corrected.     Earleen Newport, MD 01/25/19 1338    Earleen Newport, MD 01/25/19 1340

## 2019-01-27 LAB — URINE CULTURE
Culture: >=100000 — AB
Special Requests: NORMAL

## 2019-01-27 NOTE — Telephone Encounter (Signed)
Forwarding to [employer], Medical sales representative.

## 2019-01-29 ENCOUNTER — Other Ambulatory Visit: Payer: Self-pay

## 2019-01-29 ENCOUNTER — Encounter
Admission: RE | Admit: 2019-01-29 | Discharge: 2019-01-29 | Disposition: A | Discharge status: Home / Self Care | Payer: Medicare Other | Source: Ambulatory Visit | Attending: Urology | Admitting: Urology

## 2019-01-29 NOTE — Patient Instructions (Signed)
Your procedure is scheduled on: Tuesday 02/09/19  Report to [place]. To find out your arrival time please call [phone] between 1PM - 3PM on Monday 02/08/19   Remember: Instructions that are not followed completely may result in serious medical risk, up to and including death, or upon the discretion of your surgeon and anesthesiologist your surgery may need to be rescheduled.      _X__ 1. Do not eat food after midnight the night before your procedure.                 No gum chewing or hard candies. You may drink clear liquids up to 2 hours                 before you are scheduled to arrive for your surgery- DO NOT drink clear                 liquids within 2 hours of the start of your surgery.                 Clear Liquids include:  water, apple juice without pulp, clear carbohydrate                 drink such as Clearfast or Gatorade, Black Coffee or Tea (Do not add                 milk or creamer to coffee or tea).   __X__2.  On the morning of surgery brush your teeth with toothpaste and water, you may rinse your mouth with mouthwash if you wish.  Do not swallow any toothpaste or mouthwash.      _X__ 3.  No Alcohol for 24 hours before or after surgery.    _X__ 4.  Do Not Smoke or use e-cigarettes For 24 Hours Prior to Your Surgery.                 Do not use any chewable tobacco products for at least 6 hours prior to                 surgery.  __X__6.  Notify your doctor if there is any change in your medical condition      (cold, fever, infections).      Do not wear jewelry, make-up, hairpins, clips or nail polish. Do not wear lotions, powders, or perfumes.  Do not shave 48 hours prior to surgery. Men may shave face and neck. Do not bring valuables to the hospital.    [facility] is not responsible for any belongings or valuables.   Contacts, dentures/partials or body piercings may not be worn into surgery. Bring  a case for your contacts, glasses or hearing aids, a denture cup will be supplied. .   Patients discharged the day of surgery will not be allowed to drive home.     Please read over the following fact sheets that you were given:   MRSA Information   __X__ Take these medicines the morning of surgery with A SIP OF WATER:     1. albuterol (PROVENTIL HFA;VENTOLIN HFA) 108 (90 Base) MCG/ACT inhaler  2. amLODipine (NORVASC) 10 MG tablet  3. budesonide-formoterol (SYMBICORT) 160-4.5 MCG/ACT inhaler  4. metoprolol (TOPROL-XL) 200 MG 24 hr tablet  5. oxyCODONE-acetaminophen (PERCOCET) 5-325 MG tablet if needed    _ X___ Use inhalers on the day of surgery. Also bring the inhaler with you to the hospital  on the morning of surgery.    __X__ Stop Blood Thinners Coumadin/Plavix/Xarelto/Pleta/Pradaxa/Eliquis/Effient/Aspirin     __X__ Stop Anti-inflammatories 7 days before surgery such as Advil, Ibuprofen, Motrin, BC or Goodies Powder, Naprosyn, Naproxen, Aleve, Aspirin, Meloxicam. May take Tylenol if needed for pain or discomfort.     __X__ Don't start taking any new herbal supplements until after your surgery.

## 2019-01-29 NOTE — Pre-Procedure Instructions (Signed)
Voicemail left for patient to return to PAT on Friday 02/05/19 to repeat a UC. Patient is currently being treated for a UTI.

## 2019-02-05 ENCOUNTER — Other Ambulatory Visit: Payer: Self-pay

## 2019-02-05 ENCOUNTER — Encounter
Admission: RE | Admit: 2019-02-05 | Discharge: 2019-02-05 | Disposition: A | Discharge status: Home / Self Care | Payer: Medicare Other | Source: Ambulatory Visit | Attending: Urology | Admitting: Urology

## 2019-02-05 ENCOUNTER — Other Ambulatory Visit
Admission: RE | Admit: 2019-02-05 | Discharge: 2019-02-05 | Disposition: A | Discharge status: Home / Self Care | Payer: Medicare Other | Source: Ambulatory Visit | Attending: Urology | Admitting: Urology

## 2019-02-05 DIAGNOSIS — Z01812 Encounter for preprocedural laboratory examination: Secondary | ICD-10-CM | POA: Diagnosis not present

## 2019-02-06 LAB — URINE CULTURE: CULTURE: NO GROWTH

## 2019-02-08 MED ORDER — CEFAZOLIN SODIUM-DEXTROSE 2-4 GM/100ML-% IV SOLN
2.0000 g | Freq: Once | INTRAVENOUS | Status: AC
  Administered 2019-02-09: 2 g via INTRAVENOUS

## 2019-02-09 ENCOUNTER — Encounter
Admission: RE | Disposition: A | Discharge status: Home / Self Care | Payer: Self-pay | Source: Home / Self Care | Attending: Urology

## 2019-02-09 ENCOUNTER — Ambulatory Visit: Payer: Medicare Other | Admitting: Anesthesiology

## 2019-02-09 ENCOUNTER — Other Ambulatory Visit: Payer: Self-pay

## 2019-02-09 ENCOUNTER — Telehealth: Payer: Self-pay | Admitting: Urology

## 2019-02-09 ENCOUNTER — Ambulatory Visit
Admission: RE | Admit: 2019-02-09 | Discharge: 2019-02-09 | Disposition: A | Discharge status: Home / Self Care | Payer: Medicare Other | Attending: Urology | Admitting: Urology

## 2019-02-09 ENCOUNTER — Encounter: Payer: Self-pay | Admitting: *Deleted

## 2019-02-09 DIAGNOSIS — F4310 Post-traumatic stress disorder, unspecified: Secondary | ICD-10-CM | POA: Insufficient documentation

## 2019-02-09 DIAGNOSIS — Z79899 Other long term (current) drug therapy: Secondary | ICD-10-CM | POA: Insufficient documentation

## 2019-02-09 DIAGNOSIS — Z8546 Personal history of malignant neoplasm of prostate: Secondary | ICD-10-CM | POA: Insufficient documentation

## 2019-02-09 DIAGNOSIS — F17210 Nicotine dependence, cigarettes, uncomplicated: Secondary | ICD-10-CM | POA: Diagnosis not present

## 2019-02-09 DIAGNOSIS — N189 Chronic kidney disease, unspecified: Secondary | ICD-10-CM | POA: Diagnosis not present

## 2019-02-09 DIAGNOSIS — D4959 Neoplasm of unspecified behavior of other genitourinary organ: Secondary | ICD-10-CM

## 2019-02-09 DIAGNOSIS — C661 Malignant neoplasm of right ureter: Secondary | ICD-10-CM | POA: Insufficient documentation

## 2019-02-09 DIAGNOSIS — Z7951 Long term (current) use of inhaled steroids: Secondary | ICD-10-CM | POA: Diagnosis not present

## 2019-02-09 DIAGNOSIS — C651 Malignant neoplasm of right renal pelvis: Secondary | ICD-10-CM | POA: Diagnosis not present

## 2019-02-09 DIAGNOSIS — I129 Hypertensive chronic kidney disease with stage 1 through stage 4 chronic kidney disease, or unspecified chronic kidney disease: Secondary | ICD-10-CM | POA: Insufficient documentation

## 2019-02-09 DIAGNOSIS — J449 Chronic obstructive pulmonary disease, unspecified: Secondary | ICD-10-CM | POA: Insufficient documentation

## 2019-02-09 DIAGNOSIS — R109 Unspecified abdominal pain: Secondary | ICD-10-CM | POA: Diagnosis present

## 2019-02-09 DIAGNOSIS — R834 Abnormal immunological findings in cerebrospinal fluid: Secondary | ICD-10-CM | POA: Diagnosis not present

## 2019-02-09 HISTORY — PX: CYSTOSCOPY W/ RETROGRADES: SHX1426

## 2019-02-09 HISTORY — PX: URETEROSCOPY: SHX842

## 2019-02-09 HISTORY — PX: CYSTOSCOPY WITH BIOPSY: SHX5122

## 2019-02-09 HISTORY — PX: CYSTOSCOPY WITH STENT PLACEMENT: SHX5790

## 2019-02-09 LAB — URINE DRUG SCREEN, QUALITATIVE (ARMC ONLY)
AMPHETAMINES, UR SCREEN: NOT DETECTED
Barbiturates, Ur Screen: NOT DETECTED
Benzodiazepine, Ur Scrn: NOT DETECTED
Cannabinoid 50 Ng, Ur ~~LOC~~: POSITIVE — AB
Cocaine Metabolite,Ur ~~LOC~~: NOT DETECTED
MDMA (Ecstasy)Ur Screen: NOT DETECTED
METHADONE SCREEN, URINE: NOT DETECTED
Opiate, Ur Screen: NOT DETECTED
Phencyclidine (PCP) Ur S: NOT DETECTED
Tricyclic, Ur Screen: NOT DETECTED

## 2019-02-09 SURGERY — CYSTOSCOPY, WITH STENT INSERTION
Anesthesia: General | Site: Ureter | Laterality: Right

## 2019-02-09 MED ORDER — DEXAMETHASONE SODIUM PHOSPHATE 10 MG/ML IJ SOLN
INTRAMUSCULAR | Status: DC | PRN
  Administered 2019-02-09: 10 mg via INTRAVENOUS

## 2019-02-09 MED ORDER — SUCCINYLCHOLINE CHLORIDE 20 MG/ML IJ SOLN
INTRAMUSCULAR | Status: AC
  Filled 2019-02-09: qty 1

## 2019-02-09 MED ORDER — LIDOCAINE HCL (PF) 2 % IJ SOLN
INTRAMUSCULAR | Status: AC
  Filled 2019-02-09: qty 10

## 2019-02-09 MED ORDER — ONDANSETRON HCL 4 MG/2ML IJ SOLN
INTRAMUSCULAR | Status: AC
  Filled 2019-02-09: qty 2

## 2019-02-09 MED ORDER — EPHEDRINE SULFATE 50 MG/ML IJ SOLN
INTRAMUSCULAR | Status: DC | PRN
  Administered 2019-02-09: 5 mg via INTRAVENOUS
  Administered 2019-02-09: 10 mg via INTRAVENOUS
  Administered 2019-02-09: 5 mg via INTRAVENOUS
  Administered 2019-02-09 (×3): 10 mg via INTRAVENOUS

## 2019-02-09 MED ORDER — GLYCOPYRROLATE 0.2 MG/ML IJ SOLN
INTRAMUSCULAR | Status: DC | PRN
  Administered 2019-02-09: 0.2 mg via INTRAVENOUS

## 2019-02-09 MED ORDER — SUGAMMADEX SODIUM 200 MG/2ML IV SOLN
INTRAVENOUS | Status: AC
  Filled 2019-02-09: qty 2

## 2019-02-09 MED ORDER — OXYBUTYNIN CHLORIDE 5 MG PO TABS: ORAL_TABLET | ORAL | 0 refills | Status: DC

## 2019-02-09 MED ORDER — LACTATED RINGERS IV SOLN
INTRAVENOUS | Status: DC
  Administered 2019-02-09: 07:00:00 via INTRAVENOUS

## 2019-02-09 MED ORDER — METOPROLOL TARTRATE 5 MG/5ML IV SOLN
INTRAVENOUS | Status: AC
  Filled 2019-02-09: qty 10

## 2019-02-09 MED ORDER — LIDOCAINE HCL (CARDIAC) PF 100 MG/5ML IV SOSY
PREFILLED_SYRINGE | INTRAVENOUS | Status: DC | PRN
  Administered 2019-02-09: 100 mg via INTRAVENOUS

## 2019-02-09 MED ORDER — FENTANYL CITRATE (PF) 100 MCG/2ML IJ SOLN
INTRAMUSCULAR | Status: DC | PRN
  Administered 2019-02-09: 25 ug via INTRAVENOUS
  Administered 2019-02-09: 50 ug via INTRAVENOUS
  Administered 2019-02-09: 25 ug via INTRAVENOUS

## 2019-02-09 MED ORDER — CEFAZOLIN SODIUM-DEXTROSE 2-4 GM/100ML-% IV SOLN
INTRAVENOUS | Status: AC
  Filled 2019-02-09: qty 100

## 2019-02-09 MED ORDER — FENTANYL CITRATE (PF) 100 MCG/2ML IJ SOLN
INTRAMUSCULAR | Status: AC
  Filled 2019-02-09: qty 2

## 2019-02-09 MED ORDER — EPHEDRINE SULFATE 50 MG/ML IJ SOLN
INTRAMUSCULAR | Status: AC
  Filled 2019-02-09: qty 1

## 2019-02-09 MED ORDER — ONDANSETRON HCL 4 MG/2ML IJ SOLN
INTRAMUSCULAR | Status: DC | PRN
  Administered 2019-02-09: 4 mg via INTRAVENOUS

## 2019-02-09 MED ORDER — FENTANYL CITRATE (PF) 100 MCG/2ML IJ SOLN: 25.0000 ug | INTRAMUSCULAR | Status: DC | PRN

## 2019-02-09 MED ORDER — ONDANSETRON HCL 4 MG/2ML IJ SOLN: 4.0000 mg | Freq: Once | INTRAMUSCULAR | Status: DC | PRN

## 2019-02-09 MED ORDER — SUCCINYLCHOLINE CHLORIDE 20 MG/ML IJ SOLN
INTRAMUSCULAR | Status: DC | PRN
  Administered 2019-02-09: 140 mg via INTRAVENOUS

## 2019-02-09 MED ORDER — SEVOFLURANE IN SOLN
RESPIRATORY_TRACT | Status: AC
  Filled 2019-02-09: qty 250

## 2019-02-09 MED ORDER — PHENYLEPHRINE HCL 10 MG/ML IJ SOLN
INTRAMUSCULAR | Status: AC
  Filled 2019-02-09: qty 1

## 2019-02-09 MED ORDER — PHENYLEPHRINE HCL 10 MG/ML IJ SOLN
INTRAMUSCULAR | Status: DC | PRN
  Administered 2019-02-09: 100 ug via INTRAVENOUS
  Administered 2019-02-09: 50 ug via INTRAVENOUS
  Administered 2019-02-09 (×4): 100 ug via INTRAVENOUS

## 2019-02-09 MED ORDER — ROCURONIUM BROMIDE 100 MG/10ML IV SOLN
INTRAVENOUS | Status: DC | PRN
  Administered 2019-02-09: 10 mg via INTRAVENOUS

## 2019-02-09 MED ORDER — FAMOTIDINE 20 MG PO TABS
ORAL_TABLET | ORAL | Status: AC
  Administered 2019-02-09: 20 mg via ORAL
  Filled 2019-02-09: qty 1

## 2019-02-09 MED ORDER — DEXAMETHASONE SODIUM PHOSPHATE 10 MG/ML IJ SOLN
INTRAMUSCULAR | Status: AC
  Filled 2019-02-09: qty 1

## 2019-02-09 MED ORDER — FAMOTIDINE 20 MG PO TABS
20.0000 mg | ORAL_TABLET | Freq: Once | ORAL | Status: AC
  Administered 2019-02-09: 20 mg via ORAL

## 2019-02-09 MED ORDER — OXYCODONE-ACETAMINOPHEN 5-325 MG PO TABS: 1.0000 | ORAL_TABLET | Freq: Three times a day (TID) | ORAL | 0 refills | Status: DC | PRN

## 2019-02-09 MED ORDER — PROPOFOL 10 MG/ML IV BOLUS
INTRAVENOUS | Status: DC | PRN
  Administered 2019-02-09: 180 mg via INTRAVENOUS

## 2019-02-09 MED ORDER — PROPOFOL 10 MG/ML IV BOLUS
INTRAVENOUS | Status: AC
  Filled 2019-02-09: qty 20

## 2019-02-09 MED ORDER — ROCURONIUM BROMIDE 50 MG/5ML IV SOLN
INTRAVENOUS | Status: AC
  Filled 2019-02-09: qty 1

## 2019-02-09 MED ORDER — SUGAMMADEX SODIUM 200 MG/2ML IV SOLN
INTRAVENOUS | Status: DC | PRN
  Administered 2019-02-09: 200 mg via INTRAVENOUS

## 2019-02-09 MED ORDER — IOPAMIDOL (ISOVUE-200) INJECTION 41%
INTRAVENOUS | Status: DC | PRN
  Administered 2019-02-09: 20 mL

## 2019-02-09 MED ORDER — GLYCOPYRROLATE 0.2 MG/ML IJ SOLN
INTRAMUSCULAR | Status: AC
  Filled 2019-02-09: qty 1

## 2019-02-09 SURGICAL SUPPLY — 38 items
BAG DRAIN CYSTO-URO LG1000N (MISCELLANEOUS) ×4 IMPLANT
BASKET ZERO TIP 1.9FR (BASKET) ×4 IMPLANT
BRUSH SCRUB EZ  4% CHG (MISCELLANEOUS) ×2
BRUSH SCRUB EZ 1% IODOPHOR (MISCELLANEOUS) ×4 IMPLANT
BRUSH SCRUB EZ 4% CHG (MISCELLANEOUS) ×2 IMPLANT
CATH URETL 5X70 OPEN END (CATHETERS) ×4 IMPLANT
CNTNR SPEC 2.5X3XGRAD LEK (MISCELLANEOUS)
CONT SPEC 4OZ STER OR WHT (MISCELLANEOUS)
CONTAINER SPEC 2.5X3XGRAD LEK (MISCELLANEOUS) IMPLANT
COVER WAND RF STERILE (DRAPES) IMPLANT
DRAPE UTILITY 15X26 TOWEL STRL (DRAPES) ×4 IMPLANT
DRSG TELFA 4X3 1S NADH ST (GAUZE/BANDAGES/DRESSINGS) ×4 IMPLANT
ELECT REM PT RETURN 9FT ADLT (ELECTROSURGICAL) ×4
ELECTRODE REM PT RTRN 9FT ADLT (ELECTROSURGICAL) ×2 IMPLANT
FORCEPS BIOP PIRANHA Y (CUTTING FORCEPS) ×4 IMPLANT
GLOVE BIO SURGEON STRL SZ8 (GLOVE) ×4 IMPLANT
GOWN STRL REUS W/ TWL LRG LVL3 (GOWN DISPOSABLE) ×4 IMPLANT
GOWN STRL REUS W/ TWL XL LVL3 (GOWN DISPOSABLE) ×2 IMPLANT
GOWN STRL REUS W/TWL LRG LVL3 (GOWN DISPOSABLE) ×4
GOWN STRL REUS W/TWL XL LVL3 (GOWN DISPOSABLE) ×6 IMPLANT
GUIDEWIRE GREEN .038 145CM (MISCELLANEOUS) ×4 IMPLANT
GUIDEWIRE STR DUAL SENSOR (WIRE) ×8 IMPLANT
INFUSOR MANOMETER BAG 3000ML (MISCELLANEOUS) ×4 IMPLANT
INTRODUCER DILATOR DOUBLE (INTRODUCER) IMPLANT
KIT TURNOVER CYSTO (KITS) ×4 IMPLANT
NDL SAFETY ECLIPSE 18X1.5 (NEEDLE) ×2 IMPLANT
NEEDLE HYPO 18GX1.5 SHARP (NEEDLE) ×2
PACK CYSTO AR (MISCELLANEOUS) ×4 IMPLANT
SET CYSTO W/LG BORE CLAMP LF (SET/KITS/TRAYS/PACK) ×4 IMPLANT
SHEATH URETERAL 12FRX35CM (MISCELLANEOUS) IMPLANT
SOL .9 NS 3000ML IRR  AL (IV SOLUTION) ×2
SOL .9 NS 3000ML IRR UROMATIC (IV SOLUTION) ×2 IMPLANT
STENT URET 6FRX24 CONTOUR (STENTS) ×4 IMPLANT
STENT URET 6FRX26 CONTOUR (STENTS) IMPLANT
SURGILUBE 2OZ TUBE FLIPTOP (MISCELLANEOUS) ×4 IMPLANT
VALVE UROSEAL ADJ ENDO (VALVE) ×4 IMPLANT
WATER STERILE IRR 1000ML POUR (IV SOLUTION) ×4 IMPLANT
WATER STERILE IRR 3000ML UROMA (IV SOLUTION) ×4 IMPLANT

## 2019-02-09 NOTE — Anesthesia Procedure Notes (Signed)
Procedure Name: Intubation Date/Time: 02/09/2019 7:45 AM Performed by: Doreen Salvage, CRNA Pre-anesthesia Checklist: Patient identified, Patient being monitored, Timeout performed, Emergency Drugs available and Suction available Patient Re-evaluated:Patient Re-evaluated prior to induction Oxygen Delivery Method: Circle system utilized Preoxygenation: Pre-oxygenation with 100% oxygen Induction Type: IV induction Ventilation: Mask ventilation without difficulty Laryngoscope Size: Mac, McGraph and 4 Grade View: Grade I Tube type: Oral Tube size: 7.5 mm Number of attempts: 1 Airway Equipment and Method: Stylet Placement Confirmation: ETT inserted through vocal cords under direct vision,  positive ETCO2 and breath sounds checked- equal and bilateral Secured at: 21 cm Tube secured with: Tape Dental Injury: Teeth and Oropharynx as per pre-operative assessment

## 2019-02-09 NOTE — Telephone Encounter (Signed)
Patient notified

## 2019-02-09 NOTE — Discharge Instructions (Addendum)
DISCHARGE INSTRUCTIONS FOR URETERAL STENT   MEDICATIONS:  1. Resume all your other meds from home.  2.  AZO (over-the-counter) can help with the burning/stinging when you urinate. 3.  Oxybutynin is for bladder spasm/irritation, Rx was sent to your pharmacy.  ACTIVITY:  1. May resume regular activities in 24 hours. 2. No driving while on narcotic pain medications  3. Drink plenty of water  4. Continue to walk at home - you can still get blood clots when you are at home, so keep active, but don't over do it.  5. May return to work/school tomorrow or when you feel ready   BATHING:  1. You can shower or bathe.   SIGNS/SYMPTOMS TO CALL:  Please call us if you have a fever greater than 101.5, uncontrolled nausea/vomiting, uncontrolled pain, dizziness, unable to urinate, excessively bloody urine, chest pain, shortness of breath, leg swelling, leg pain, or any other concerns or questions.   Urinary frequency, urgency and blood in the urine is normal.  You can reach [place] at [phone].   FOLLOW-UP:  1. You we will be contacted for a follow-up appointment.  Will call with your pathology results as soon as I receive.  AMBULATORY SURGERY  DISCHARGE INSTRUCTIONS   1) The drugs that you were given will stay in your system until tomorrow so for the next 24 hours you should not:  A) Drive an automobile B) Make any legal decisions C) Drink any alcoholic beverage   2) You may resume regular meals tomorrow.  Today it is better to start with liquids and gradually work up to solid foods.  You may eat anything you prefer, but it is better to start with liquids, then soup and crackers, and gradually work up to solid foods.   3) Please notify your doctor immediately if you have any unusual bleeding, trouble breathing, redness and pain at the surgery site, drainage, fever, or pain not relieved by medication.    4) Additional Instructions:        Please contact your physician with any  problems or Same Day Surgery at [phone], Monday through Friday 6 am to 4 pm, or [place] at [facility] number at [phone].

## 2019-02-09 NOTE — Telephone Encounter (Signed)
Pt states he had surgery this morning and wasn't given anything for pain. Pt asks for something for pain to be sent to [facility] on [address]. Please advise.

## 2019-02-09 NOTE — Anesthesia Postprocedure Evaluation (Signed)
Anesthesia Post Note  Patient: Edwin Donovan.  Procedure(s) Performed: CYSTOSCOPY WITH STENT PLACEMENT (Right Ureter) CYSTOSCOPY WITH RETROGRADE PYELOGRAM (Right Ureter) URETEROSCOPY- DIAGNOSTIC (Right ) CYSTOSCOPY WITH BIOPSY (Right Ureter)  Patient location during evaluation: PACU Anesthesia Type: General Level of consciousness: awake and alert Pain management: pain level controlled Vital Signs Assessment: post-procedure vital signs reviewed and stable Respiratory status: spontaneous breathing and respiratory function stable Cardiovascular status: stable Anesthetic complications: no     Last Vitals:  Vitals:   02/09/19 0915 02/09/19 0929  BP: 129/83 125/86  Pulse: 71 60  Resp: 18 16  Temp:    SpO2: 97% 97%    Last Pain:  Vitals:   02/09/19 0929  TempSrc:   PainSc: 0-No pain                 KEPHART,Sipriano K

## 2019-02-09 NOTE — Transfer of Care (Signed)
Immediate Anesthesia Transfer of Care Note  Patient: Prestyn Stanco.  Procedure(s) Performed: Procedure(s): CYSTOSCOPY WITH STENT PLACEMENT (Right) CYSTOSCOPY WITH RETROGRADE PYELOGRAM (Right) URETEROSCOPY- DIAGNOSTIC (Right) CYSTOSCOPY WITH BIOPSY (Right)  Patient Location: PACU  Anesthesia Type:General  Level of Consciousness: sedated  Airway & Oxygen Therapy: Patient Spontanous Breathing and Patient connected to face mask oxygen  Post-op Assessment: Report given to RN and Post -op Vital signs reviewed and stable  Post vital signs: Reviewed and stable  Last Vitals:  Vitals:   02/09/19 0621 02/09/19 0859  BP: 122/88 133/82  Pulse: 65 64  Resp: 20 18  Temp: 37.1 C 36.6 C  SpO2: 66% 59%    Complications: No apparent anesthesia complications

## 2019-02-09 NOTE — Anesthesia Preprocedure Evaluation (Signed)
Anesthesia Evaluation  Patient identified by MRN, date of birth, ID band Patient awake    Reviewed: Allergy & Precautions, NPO status , Patient's Chart, lab work & pertinent test results  History of Anesthesia Complications Negative for: history of anesthetic complications  Airway Mallampati: III       Dental  (+) Edentulous Upper   Pulmonary neg sleep apnea, COPD,  COPD inhaler, Current Smoker,           Cardiovascular hypertension, Pt. on medications and Pt. on home beta blockers (-) Past MI and (-) CHF + dysrhythmias (-) Valvular Problems/Murmurs     Neuro/Psych neg Seizures Anxiety    GI/Hepatic Neg liver ROS, neg GERD  ,  Endo/Other  neg diabetes  Renal/GU Renal disease (stones, mass)     Musculoskeletal   Abdominal   Peds  Hematology   Anesthesia Other Findings   Reproductive/Obstetrics                             Anesthesia Physical Anesthesia Plan  ASA: III  Anesthesia Plan: General   Post-op Pain Management:    Induction: Intravenous  PONV Risk Score and Plan: 1 and Ondansetron  Airway Management Planned: Oral ETT  Additional Equipment:   Intra-op Plan:   Post-operative Plan:   Informed Consent: I have reviewed the patients History and Physical, chart, labs and discussed the procedure including the risks, benefits and alternatives for the proposed anesthesia with the patient or authorized representative who has indicated his/her understanding and acceptance.       Plan Discussed with:   Anesthesia Plan Comments:         Anesthesia Quick Evaluation

## 2019-02-09 NOTE — Op Note (Signed)
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 Cystoscopy 2. Right ureteroscopy, diagnostic 3. Multiple biopsies tumor right ureter and renal pelvis 4. Right ureteral stent placement (6FR) 24 cm 5. Right retrograde pyelography with interpretation  Surgeon: Nicki Reaper C. Lyniah Fujita, M.D.  Anesthesia: General  Complications: None  Intraoperative findings:  1.  Right retrograde pyelography demonstrated filling defect defects within the right distal ureter and renal pelvis suspicious for urothelial tumor  EBL: Minimal  Specimens: 1. Biopsies tumor right distal ureter 2. Biopsies tumor right proximal ureter 3. Biopsies tumor right renal pelvis   Indication: Edwin Donovan. is a 73 y.o. year old patient with a history of stone disease who initially presented with right flank pain.  He was noted to have microhematuria.  CT urogram showed multiple filling defects in the renal pelvis and ureter.  After reviewing the management options for treatment, the patient elected to proceed with the above surgical procedure(s). We have discussed the potential benefits and risks of the procedure, side effects of the proposed treatment, the likelihood of the patient achieving the goals of the procedure, and any potential problems that might occur during the procedure or recuperation. Informed consent has been obtained.  Description of procedure:  The patient was taken to the operating room and general anesthesia was induced.  The patient was placed in the dorsal lithotomy position, prepped and draped in the usual sterile fashion, and preoperative antibiotics were administered. A preoperative time-out was performed.   A 21 French cystoscope was lubricated and passed under direct vision.  The urethra was normal in caliber without stricture.  The prostate demonstrated TUR changes and an open prostatic fossa.     Panendoscopy was performed and the bladder mucosa showed no erythema, solid or papillary lesions.  There was papillary tumor protruding from the right ureteral orifice.  Attention was directed to the right ureteral orifice and a 0.038 Sensor wire was then advanced up the right ureter into the renal pelvis under fluoroscopic guidance.  A 5 French open-ended ureteral catheter was placed over the wire to the level of the mid ureter and retrograde pyelogram was performed with findings as described above.  The guidewire was replaced and the ureteral catheter was removed.  A 4.5 Fr semirigid ureteroscope was then advanced into the ureter next to the guidewire and papillary tumor was noted within the first 2 cm of the distal ureter.  The semirigid ureteroscope was advanced to the upper proximal ureter and several papillary tumor implants are noted throughout the course of the ureter.  Biopsies of the tumor and the distal and proximal ureter were obtained with a 1.9 [place] nitinol basket and [place] biopsy forceps.  A second sensor wire was placed through the semirigid ureteroscope and this scope was removed.  A single channel digital flexible ureteroscope was then placed over the working wire and advanced in the ureter without difficulty.  It was advanced to the renal pelvis and within the renal pelvis and calyces extensive papillary tumor was noted.  Saline washings were obtained of the renal pelvis and sent for cytology.  Biopsies were then obtained with the Cole forceps.  The flexible ureteroscope was removed.  The safety wire was backloaded on a cystoscope and a 6 French/24 cm double-J ureteral stent was then placed over the safety wire.  Good positioning was noted proximally by fluoroscopy and distally under direct vision  The bladder was then emptied and the  procedure ended.  The patient appeared to tolerate the procedure well and without complications.  After anesthetic reversal the patient was  transported to the PACU in stable condition.    Edwin Giovanni, MD

## 2019-02-09 NOTE — Interval H&P Note (Signed)
History and Physical Interval Note:  02/09/2019 7:39 AM  Edwin Donovan.  has presented today for surgery, with the diagnosis of abnormal ct urogram.  The various methods of treatment have been discussed with the patient and family. After consideration of risks, benefits and other options for treatment, the patient has consented to  Procedure(s): CYSTOSCOPY WITH STENT PLACEMENT (Right) CYSTOSCOPY WITH RETROGRADE PYELOGRAM (Right) URETEROSCOPY- DIAGNOSTIC (Right) CYSTOSCOPY WITH BIOPSY (Right) as a surgical intervention.  The patient's history has been reviewed, patient examined, no change in status, stable for surgery.  I have reviewed the patient's chart and labs.  Questions were answered to the patient's satisfaction.     Edwin Donovan

## 2019-02-09 NOTE — H&P (Signed)
02/09/2019 7:17 AM   Edwin Donovan. 1946-04-06 [id number]  Referring provider: No referring provider defined for this encounter.   HPI: 73 year old male with a history of right flank pain which has been intermittent since late October 2019.  He has a history of stone disease.  A CT urogram was performed which showed multiple filling defects in the right renal pelvis and distal ureter.  He presents for cystoscopy right retrograde pyelogram, diagnostic ureteroscopy with possible biopsy.  He has no complaints today.  Urine culture was negative.   PMH: Past Medical History:  Diagnosis Date  . Anxiety    ptsd  . Arthritis   . Cancer ([place])    PROSTATE  . Chronic kidney disease    stones  . COPD (chronic obstructive pulmonary disease) ([place])   . Dysrhythmia   . History of kidney stones   . Hypertension   . Kidney stone   . Pneumonia    06/27/16  . Shortness of breath dyspnea     Surgical History: Past Surgical History:  Procedure Laterality Date  . CYSTOSCOPY W/ RETROGRADES Bilateral 12/26/2017   Procedure: CYSTOSCOPY WITH RETROGRADE PYELOGRAM;  Surgeon: Abbie Sons, MD;  Location: [facility];  Service: Urology;  Laterality: Bilateral;  . CYSTOSCOPY W/ URETERAL STENT PLACEMENT Right 07/29/2016   Procedure: CYSTOSCOPY WITH STENT REPLACEMENT;  Surgeon: Hollice Espy, MD;  Location: [facility];  Service: Urology;  Laterality: Right;  . CYSTOSCOPY WITH BIOPSY Right 07/29/2016   Procedure: CYSTOSCOPY WITH BIOPSY;  Surgeon: Hollice Espy, MD;  Location: [facility];  Service: Urology;  Laterality: Right;  . CYSTOSCOPY WITH STENT PLACEMENT Right 06/28/2016   Procedure: CYSTOSCOPY WITH STENT PLACEMENT;  Surgeon: Hollice Espy, MD;  Location: [facility];  Service: Urology;  Laterality: Right;  . CYSTOSCOPY/URETEROSCOPY/HOLMIUM LASER/STENT PLACEMENT Right 12/26/2017   Procedure: CYSTOSCOPY/URETEROSCOPY/HOLMIUM LASER/STENT PLACEMENT;  Surgeon: Abbie Sons, MD;  Location: [facility];   Service: Urology;  Laterality: Right;  . EXTRACORPOREAL SHOCK WAVE LITHOTRIPSY Right 12/04/2017   Procedure: EXTRACORPOREAL SHOCK WAVE LITHOTRIPSY (ESWL);  Surgeon: Hollice Espy, MD;  Location: [facility];  Service: Urology;  Laterality: Right;  . PROSTATECTOMY    . URETEROSCOPY Right 07/29/2016   Procedure: URETEROSCOPY;  Surgeon: Hollice Espy, MD;  Location: [facility];  Service: Urology;  Laterality: Right;    Home Medications:    acetaminophen 500 MG tablet Commonly known as:  TYLENOL Take 500 mg by mouth every 6 (six) hours as needed.   albuterol 108 (90 Base) MCG/ACT inhaler Commonly known as:  PROVENTIL HFA;VENTOLIN HFA Inhale 1-2 puffs into the lungs every 6 (six) hours as needed for wheezing or shortness of breath.   amLODipine 10 MG tablet Commonly known as:  NORVASC Take 10 mg by mouth daily.   budesonide-formoterol 160-4.5 MCG/ACT inhaler Commonly known as:  SYMBICORT Inhale 2 puffs into the lungs 2 (two) times daily.   FLUoxetine 20 MG capsule Commonly known as:  PROZAC Take 40 mg by mouth daily.   ibuprofen 600 MG tablet Commonly known as:  ADVIL,MOTRIN Take 1 tablet (600 mg total) by mouth every 8 (eight) hours as needed.   lidocaine 5 % Commonly known as:  LIDODERM Place 2 patches onto the skin daily. Remove & Discard patch within 12 hours or as directed by MD   lisinopril 10 MG tablet Commonly known as:  PRINIVIL,ZESTRIL Take 1 tablet (10 mg total) by mouth daily.   metoprolol succinate 25 MG 24 hr tablet Commonly known as:  TOPROL-XL Take 12.5  mg by mouth daily.   traZODone 100 MG tablet Commonly known as:  DESYREL Take 100 mg by mouth at bedtime as needed for sleep.    Allergies: No Known Allergies  Family History: Family History  Problem Relation Age of Onset  . Stroke Mother   . Heart attack Father   . Lung cancer Father   . Kidney disease Father   . Prostate cancer Neg Hx   . Bladder Cancer Neg Hx     Social History:   reports that he has been smoking cigarettes. He has been smoking about 0.50 packs per day. He has never used smokeless tobacco. He reports current alcohol use of about 14.0 standard drinks of alcohol per week. He reports current drug use. Drug: Marijuana.  ROS: No significant changes from 09/23/2018  Physical Exam: BP 122/88   Pulse 65   Temp 98.7 F (37.1 C) (Tympanic)   Resp 20   SpO2 97%   Constitutional:  Alert and oriented, No acute distress. HEENT: [place] AT, moist mucus membranes.  Trachea midline, no masses. Cardiovascular: No clubbing, cyanosis, or edema.  RRR Respiratory: Normal respiratory effort, no increased work of breathing. Clear GI: Abdomen is soft, nontender, nondistended, no abdominal masses GU: No CVA tenderness Lymph: No cervical or inguinal lymphadenopathy. Skin: No rashes, bruises or suspicious lesions. Neurologic: Grossly intact, no focal deficits, moving all 4 extremities. Psychiatric: Normal mood and affect.  Imaging: Results for orders placed during the hospital encounter of 01/06/19  CT HEMATURIA WORKUP   Narrative CLINICAL DATA:  Dysuria and right flank pain over the past 4 months. History prostate cancer. Urinary tract infections. Microhematuria.  EXAM: CT ABDOMEN AND PELVIS WITHOUT AND WITH CONTRAST  TECHNIQUE: Multidetector CT imaging of the abdomen and pelvis was performed following the standard protocol before and following the bolus administration of intravenous contrast.  CONTRAST:  156mL OMNIPAQUE IOHEXOL 300 MG/ML  SOLN  COMPARISON:  Multiple exams, including 12/02/2018  FINDINGS: Lower chest: Right coronary artery and descending thoracic aortic atherosclerotic calcification. Mild circumflex coronary artery atherosclerotic calcification.  Hepatobiliary: Dependent density in the gallbladder favoring sludge or gallstones. The liver appears otherwise unremarkable. No biliary dilatation.  Pancreas: Unremarkable  Spleen:  Unremarkable  Adrenals/Urinary Tract: Both adrenal glands appear normal.  Irregular nodular and sessile plaque-like filling defects in the right renal collecting system, calyces/infundibular, and in the right proximal and right distal ureter. I favor these lesions is at [facility] based on manual measurements, and accordingly favor multifocal transitional cell carcinoma over entity such as pyelitis cystica and ureteritis cystica. Definite tumor is not seen in the urinary bladder although because of prolonged prone positioning prior to the delayed images, contrast medium layers in the urinary bladder rather than being well mixed, which might obscure posterior urinary bladder lesions in this case.  No stones are identified. A hyperdense 1.0 cm left kidney upper pole Bosniak category 2 cyst does not enhance. Additional left renal cyst do not enhance and are considered benign. The adrenal glands appear normal.  Stomach/Bowel: Sigmoid colon diverticulosis.  Vascular/Lymphatic: Aortoiliac atherosclerotic vascular disease. Infrarenal abdominal aortic aneurysm 3.4 cm in diameter on image 50/4. No pathologic adenopathy observed.  Reproductive: Prostatectomy.  Other: No supplemental non-categorized findings.  Musculoskeletal: Old fractures of the left eighth, ninth, tenth, and eleventh ribs with the tenth rib fracture being nonunited. Grade 1 degenerative anterolisthesis at L4-5 with lumbar spondylosis and degenerative disc disease causing potential impingement at L3-4, L4-5, and L5-S1. Mild dextroconvex lumbar scoliosis.  IMPRESSION: 1. Multifocal  irregular plaque-like and rounded presumed tumor deposits along the urothelium of the right collecting system, infundibular, proximal ureter, and distal ureter. Multifocal transitional cell carcinoma is strong favored over pyelitis cystica/ureteritis cystica. No pathologic adenopathy identified. 2. Infrarenal abdominal aortic aneurysm, 3.4 cm  in diameter. Recommend followup by ultrasound in 3 years. This recommendation follows ACR consensus guidelines: White Paper of the ACR Incidental Findings Committee II on Vascular Findings. J Am Coll Radiol 2013; 10:789-794. Aortic aneurysm NOS (ICD10-I71.9). 3. Other imaging findings of potential clinical significance: Aortic Atherosclerosis (ICD10-I70.0). Coronary atherosclerosis. Sludge versus gallstones in the gallbladder. Benign left renal cysts. Sigmoid colon diverticulosis. Potential mild lower lumbar impingement due to spondylosis and degenerative disc disease.   Electronically Signed   By: Van Clines M.D.   On: 01/06/2019 11:41              Assessment & Plan:   73 year old male with CT urogram showing multiple filling defects in the right collecting system suspicious for urothelial carcinoma.  He is scheduled for cystoscopy right retrograde pyelograms, right diagnostic ureteroscopy with possible biopsy.  The procedure has been discussed in detail including potential risks of bleeding, infection, ureteral injury.  The need for a stent postoperatively was discussed.  He indicated all questions were answered and desires to proceed.   Abbie Sons, [place] [address] [place], [place] [address] [phone]

## 2019-02-09 NOTE — Telephone Encounter (Signed)
He was not given anything for pain because oxycodone was listed as an active pain medication.  I sent in a limited Rx.

## 2019-02-09 NOTE — Anesthesia Post-op Follow-up Note (Signed)
Anesthesia QCDR form completed.        

## 2019-02-10 LAB — CYTOLOGY - NON PAP

## 2019-02-10 LAB — SURGICAL PATHOLOGY

## 2019-02-11 ENCOUNTER — Telehealth: Payer: Self-pay | Admitting: Urology

## 2019-02-11 NOTE — Telephone Encounter (Signed)
Pt called and would like a call back, he wants to speak to someone concerning his surgery. Please advise.

## 2019-02-17 ENCOUNTER — Telehealth: Payer: Self-pay

## 2019-02-17 NOTE — Telephone Encounter (Signed)
Spoke with patient he was not aware of what happened after surgery this was explained to him and was told we would call with pathology results. Patient did not know he had a stent in place, stent discomfort was reviewed with patient. He is not currently scheduled for a stent removal and states there is no string. When should this removal be scheduled?

## 2019-02-17 NOTE — Telephone Encounter (Signed)
Pt called back and wants someone to call him, he is confused about what was found with his surgery. Please advise.

## 2019-02-17 NOTE — Telephone Encounter (Signed)
Can you please schedule patient a virtual visit with Dr. Bernardo Heater tomorrow thanks

## 2019-02-18 ENCOUNTER — Other Ambulatory Visit: Payer: Self-pay

## 2019-02-18 ENCOUNTER — Telehealth (INDEPENDENT_AMBULATORY_CARE_PROVIDER_SITE_OTHER): Payer: Medicare Other | Admitting: Urology

## 2019-02-18 DIAGNOSIS — C679 Malignant neoplasm of bladder, unspecified: Secondary | ICD-10-CM

## 2019-02-18 NOTE — Progress Notes (Signed)
Virtual Visit via Telephone Note  I connected with Edwin Oh. on 02/18/19 at  9:00 AM EDT by telephone and verified that I am speaking with the correct person using two identifiers.   I discussed the limitations, risks, security and privacy concerns of performing an evaluation and management service by telephone and the availability of in person appointments. We discussed the impact of the COVID-19 on the healthcare system, and the importance of social distancing and reducing patient and provider exposure. I also discussed with the patient that there may be a patient responsible charge related to this service. The patient expressed understanding and agreed to proceed.  Reason for visit: Postop follow-up/pathology results  History of Present Illness: 73 year old male who underwent right ureteroscopy on 3/24 for abnormal filling defect seen on CT urogram.  Intraoperative findings remarkable for multiple papillary lesions throughout the ureter and renal pelvis.  Multiple biopsies were taken.  His only postoperative issue has been constipation which he states is resolving.  He has mild stent irritative symptoms.  Ureteral and renal pelvic tumor biopsies remarkable for high-grade urothelial carcinoma.  There was no evidence of invasion.  Assessment and Plan: 73 year old male with extensive upper tract urothelial carcinoma in the renal pelvis, calyces and the entire ureter.  The pathology report was discussed in detail.  I informed him standard treatment would be nephroureterectomy.  We discussed minimally invasive approach of laparoscopic approach.  Will set him up to see 1 of my partners for further discussion.  I provided 15 minutes of non-face-to-face time during this encounter.   Abbie Sons, MD

## 2019-02-23 ENCOUNTER — Telehealth (INDEPENDENT_AMBULATORY_CARE_PROVIDER_SITE_OTHER): Payer: Medicare Other | Admitting: Urology

## 2019-02-23 ENCOUNTER — Other Ambulatory Visit: Payer: Self-pay

## 2019-02-23 DIAGNOSIS — C641 Malignant neoplasm of right kidney, except renal pelvis: Secondary | ICD-10-CM | POA: Diagnosis not present

## 2019-02-23 DIAGNOSIS — N1330 Unspecified hydronephrosis: Secondary | ICD-10-CM

## 2019-02-23 NOTE — Progress Notes (Signed)
Virtual Visit via Video Note  I connected with Edwin Oh. on 02/23/19 at 12:30 PM EDT by a video enabled telemedicine application and verified that I am speaking with the correct person using two identifiers.   I discussed the limitations of evaluation and management by telemedicine and the availability of in person appointments. The patient expressed understanding and agreed to proceed.  History of Present Illness: 73 year old male who presents today via doxy.me virtual visit to discuss management of newly diagnosed high-grade right upper tract urothelial carcinoma.  In summary, the patient developed right flank pain nearly 6 months ago.  Noncontrast CT scan in 1/28 was suspicious for upper tract malignancy which was followed up with a CT hematuria work-up in 2/20.  This was followed by diagnostic ureteroscopy revealing multifocal high-grade right urothelial carcinoma involving the renal pelvis, infundibula, ureter and distal ureter.  Please see Dr. Dene Gentry operative notes for details.  The bladder was unremarkable.  Patient was referred to me for second opinion for consideration of right nephro ureterectomy.   Preoperatively, the patient did have right ureteral obstruction.  Right ureteral stent was placed at the time of surgery on 02/09/2019.  He is tolerating the stent well and "barely notices it there".  No gross hematuria or flank pain.   He has not had any further imaging since 01/06/2019.  At that time, showed multifocal upper tract urothelial disease without adenopathy.   Notably, the patient's past medical history is significant for kidney stones, prostate cancer status post radical prostatectomy (open), discoid lupus, COPD not on home oxygen, hypertension.  His baseline creatinine is normal.  His functional status is excellent, lives at home independently and performs all ADLs.  Preoperatively, the patient did have right ureteral obstruction.  Right ureteral stent was placed  at the time of surgery on 02/09/2019.  He is tolerating the stent well and "barely notices it there".  No gross hematuria or flank pain.   Observations/Objective: Appears clinically well, NAD in yellow/ sunny room.  Assessment and Plan:  1. Urothelial carcinoma of kidney, right (Edwin Donovan) We had a lengthy discussion today about management of high-grade upper tract urothelial cancer.  Given that it is multifocal and fairly significant tumor burden, conservative management local controls not recommended.  We discussed the role of neoadjuvant chemotherapy given that his overall functional status is good and his creatinine is normal.  We discussed the advantages to proceeding with chemo upfront including better ability to tolerate the therapy including its nephrotoxicity.  Alternatives including pursuing nephroureterectomy upfront were also reviewed.  At this point time, I would like to present his case at multidisciplinary tumor board.  I suspect recommendations will be for neoadjuvant chemotherapy followed by robotic nephro ureterectomy.  He will also need repeat staging in the form of CT chest abdomen pelvis given that has been several months with his last cross-sectional imaging and no recent chest imaging.  I will defer this to his medical oncologist.  In addition, we did go ahead and discussed nephroureterectomy at length today.  He understands that this will be several months down the road after he is recovered from neoadjuvant chemotherapy.  We did discuss the preoperative intraoperative and postoperative course of nephroureterectomy including risk of bleeding, infection, damage surrounding structures.  We discussed our approach would be robotic with a likely larger incision at the site of his previous prostatectomy for extraction.  He understands that he will have a Foley catheter for at least a week postoperatively to allow for  healing of his bladder.  All questions were answered regarding  surgery.   - Ambulatory referral to Oncology -Tumor board  2. Hydronephrosis, right Secondary to obstructing tumor Currently has ureteral stent in place which is well-tolerated We discussed that for the purpose of optimizing renal function during nephrotoxic chemotherapy, would prefer that he maintains this stent We will reassess if he has any recurrent infections, difficulty tolerating the stent, etc. He is agreeable this plan   Follow Up Instructions: Plan for follow-up in 3 months after he is completed chemotherapy for preoperative assessment for nephroureterectomy   I discussed the assessment and treatment plan with the patient. The patient was provided an opportunity to ask questions and all were answered. The patient agreed with the plan and demonstrated an understanding of the instructions.   The patient was advised to call back or seek an in-person evaluation if the symptoms worsen or if the condition fails to improve as anticipated.  I provided 25 minutes of non-face-to-face time during this encounter .   Hollice Espy, MD

## 2019-02-28 DIAGNOSIS — C689 Malignant neoplasm of urinary organ, unspecified: Secondary | ICD-10-CM | POA: Insufficient documentation

## 2019-02-28 NOTE — Progress Notes (Signed)
[place]  Telephone:([phone] [phone]  ID: [name] OB: July 27, 1946  MR#: [id number]  EUM#:[id number]  Patient Care Team: Nyra Capes as PCP - General (Family Medicine)  CHIEF COMPLAINT: Urothelial carcinoma  INTERVAL HISTORY: Patient is a 73 year old male who was recently diagnosed with high-grade right upper tract urothelial carcinoma.  His symptoms started approximately 6 months ago when he developed right flank pain.  Subsequent CT scan and diagnostic ureteroscopy confirmed the diagnosis.  Currently, he feels well and is asymptomatic.  He has no neurologic complaints.  He denies any recent fevers or illnesses.  He has no chest pain, shortness of breath, cough, or hemoptysis.  He has no nausea, vomiting, constipation, or diarrhea.  He has no urinary complaints.  Patient feels at his baseline offers no specific complaints today.  REVIEW OF SYSTEMS:   Review of Systems  Constitutional: Negative.  Negative for fever, malaise/fatigue and weight loss.  Respiratory: Negative.  Negative for cough, hemoptysis and shortness of breath.   Cardiovascular: Negative.  Negative for chest pain and leg swelling.  Gastrointestinal: Negative.  Negative for abdominal pain.  Genitourinary: Negative.  Negative for dysuria, flank pain and hematuria.  Musculoskeletal: Negative.  Negative for back pain.  Skin: Negative.  Negative for rash.  Neurological: Negative.  Negative for dizziness, focal weakness, weakness and headaches.  Psychiatric/Behavioral: Negative.  The patient is not nervous/anxious.     As per HPI. Otherwise, a complete review of systems is negative.  PAST MEDICAL HISTORY: Past Medical History:  Diagnosis Date  . Anxiety    ptsd  . Arthritis   . Cancer ([place])    PROSTATE  . Chronic kidney disease    stones  . COPD (chronic obstructive pulmonary disease) ([place])   . Dysrhythmia   . History of kidney stones   .  Hypertension   . Kidney stone   . Pneumonia    06/27/16  . Shortness of breath dyspnea     PAST SURGICAL HISTORY: Past Surgical History:  Procedure Laterality Date  . CYSTOSCOPY W/ RETROGRADES Bilateral 12/26/2017   Procedure: CYSTOSCOPY WITH RETROGRADE PYELOGRAM;  Surgeon: Abbie Sons, MD;  Location: [facility];  Service: Urology;  Laterality: Bilateral;  . CYSTOSCOPY W/ RETROGRADES Right 02/09/2019   Procedure: CYSTOSCOPY WITH RETROGRADE PYELOGRAM;  Surgeon: Abbie Sons, MD;  Location: [facility];  Service: Urology;  Laterality: Right;  . CYSTOSCOPY W/ URETERAL STENT PLACEMENT Right 07/29/2016   Procedure: CYSTOSCOPY WITH STENT REPLACEMENT;  Surgeon: Hollice Espy, MD;  Location: [facility];  Service: Urology;  Laterality: Right;  . CYSTOSCOPY WITH BIOPSY Right 07/29/2016   Procedure: CYSTOSCOPY WITH BIOPSY;  Surgeon: Hollice Espy, MD;  Location: [facility];  Service: Urology;  Laterality: Right;  . CYSTOSCOPY WITH BIOPSY Right 02/09/2019   Procedure: CYSTOSCOPY WITH BIOPSY;  Surgeon: Abbie Sons, MD;  Location: [facility];  Service: Urology;  Laterality: Right;  . CYSTOSCOPY WITH STENT PLACEMENT Right 06/28/2016   Procedure: CYSTOSCOPY WITH STENT PLACEMENT;  Surgeon: Hollice Espy, MD;  Location: [facility];  Service: Urology;  Laterality: Right;  . CYSTOSCOPY WITH STENT PLACEMENT Right 02/09/2019   Procedure: CYSTOSCOPY WITH STENT PLACEMENT;  Surgeon: Abbie Sons, MD;  Location: [facility];  Service: Urology;  Laterality: Right;  . CYSTOSCOPY/URETEROSCOPY/HOLMIUM LASER/STENT PLACEMENT Right 12/26/2017   Procedure: CYSTOSCOPY/URETEROSCOPY/HOLMIUM LASER/STENT PLACEMENT;  Surgeon: Abbie Sons, MD;  Location: [facility];  Service: Urology;  Laterality: Right;  . EXTRACORPOREAL SHOCK WAVE LITHOTRIPSY Right 12/04/2017  Procedure: EXTRACORPOREAL SHOCK WAVE LITHOTRIPSY (ESWL);  Surgeon: Hollice Espy, MD;  Location: [facility];  Service: Urology;  Laterality: Right;  . PROSTATECTOMY    .  URETEROSCOPY Right 07/29/2016   Procedure: URETEROSCOPY;  Surgeon: Hollice Espy, MD;  Location: [facility];  Service: Urology;  Laterality: Right;  . URETEROSCOPY Right 02/09/2019   Procedure: URETEROSCOPY- DIAGNOSTIC;  Surgeon: Abbie Sons, MD;  Location: [facility];  Service: Urology;  Laterality: Right;    FAMILY HISTORY: Family History  Problem Relation Age of Onset  . Stroke Mother   . Heart attack Father   . Lung cancer Father   . Kidney disease Father   . Prostate cancer Neg Hx   . Bladder Cancer Neg Hx     ADVANCED DIRECTIVES (Y/N):  N  HEALTH MAINTENANCE: Social History   Tobacco Use  . Smoking status: Current Some Day Smoker    Packs/day: 0.50    Types: Cigarettes  . Smokeless tobacco: Never Used  Substance Use Topics  . Alcohol use: Yes    Alcohol/week: 14.0 standard drinks    Types: 14 Cans of beer per week  . Drug use: Yes    Types: Marijuana    Comment: "sometimes, not very often"     Colonoscopy:  PAP:  Bone density:  Lipid panel:  No Known Allergies  Current Outpatient Medications  Medication Sig Dispense Refill  . albuterol (PROVENTIL HFA;VENTOLIN HFA) 108 (90 Base) MCG/ACT inhaler Inhale 1-2 puffs into the lungs every 6 (six) hours as needed for wheezing or shortness of breath.    Marland Kitchen amLODipine (NORVASC) 10 MG tablet Take 10 mg by mouth daily.    . budesonide-formoterol (SYMBICORT) 160-4.5 MCG/ACT inhaler Inhale 2 puffs into the lungs 2 (two) times daily.    Marland Kitchen lisinopril (PRINIVIL,ZESTRIL) 40 MG tablet Take 40 mg by mouth daily.    . metoprolol (TOPROL-XL) 200 MG 24 hr tablet Take 200 mg by mouth daily.    Marland Kitchen oxybutynin (DITROPAN) 5 MG tablet 1 tab tid prn frequency,urgency, bladder spasm (Patient not taking: Reported on 03/01/2019) 30 tablet 0  . oxyCODONE-acetaminophen (PERCOCET) 5-325 MG tablet Take 1 tablet by mouth every 8 (eight) hours as needed. (Patient not taking: Reported on 03/01/2019) 15 tablet 0   No current facility-administered  medications for this visit.     OBJECTIVE: Vitals:   03/01/19 1129  BP: (!) 146/79  Pulse: 99  Temp: 98.1 F (36.7 C)     Body mass index is 39.05 kg/m.    ECOG FS:0 - Asymptomatic  General: Well-developed, well-nourished, no acute distress. Eyes: Pink conjunctiva, anicteric sclera. HEENT: Normocephalic, moist mucous membranes, clear oropharnyx. Lungs: Clear to auscultation bilaterally. Heart: Regular rate and rhythm. No rubs, murmurs, or gallops. Abdomen: Soft, nontender, nondistended. No organomegaly noted, normoactive bowel sounds. Musculoskeletal: No edema, cyanosis, or clubbing. Neuro: Alert, answering all questions appropriately. Cranial nerves grossly intact. Skin: No rashes or petechiae noted. Psych: Normal affect. Lymphatics: No cervical, calvicular, axillary or inguinal LAD.   LAB RESULTS:  Lab Results  Component Value Date   NA 138 03/01/2019   K 3.9 03/01/2019   CL 107 03/01/2019   CO2 22 03/01/2019   GLUCOSE 123 (H) 03/01/2019   BUN 18 03/01/2019   CREATININE 1.05 03/01/2019   CALCIUM 9.1 03/01/2019   PROT 6.8 03/01/2019   ALBUMIN 3.9 03/01/2019   AST 19 03/01/2019   ALT 18 03/01/2019   ALKPHOS 65 03/01/2019   BILITOT 0.5 03/01/2019   GFRNONAA >60 03/01/2019  GFRAA >60 03/01/2019    Lab Results  Component Value Date   WBC 11.4 (H) 01/25/2019   NEUTROABS 5.3 11/23/2017   HGB 15.2 01/25/2019   HCT 45.3 01/25/2019   MCV 99.8 01/25/2019   PLT 178 01/25/2019     STUDIES: No results found.  ASSESSMENT: Urothelial carcinoma  PLAN:    1. Urothelial carcinoma: Imaging and pathology results reviewed independently.  Patient will require PET scan to complete the staging work-up.  Plan is to proceed with neoadjuvant cisplatin and gemcitabine followed by right nephroureterectomy.  Patient will have further evaluation by WebEx 1 to 2 days after his PET scan.  Plan to initiate treatment in the next 1 to 2 weeks. 2.  Renal insufficiency: Resolved.   Patient's creatinine is 1.05.  I spent a total of 60 minutes face-to-face with the patient of which greater than 50% of the visit was spent in counseling and coordination of care as detailed above.   Patient expressed understanding and was in agreement with this plan. He also understands that He can call clinic at any time with any questions, concerns, or complaints.   Cancer Staging No matching staging information was found for the patient.  Lloyd Huger, MD   03/02/2019 7:06 AM

## 2019-03-01 ENCOUNTER — Encounter: Payer: Self-pay | Admitting: Oncology

## 2019-03-01 ENCOUNTER — Other Ambulatory Visit: Payer: Self-pay

## 2019-03-01 ENCOUNTER — Inpatient Hospital Stay: Payer: Medicare Other

## 2019-03-01 ENCOUNTER — Inpatient Hospital Stay: Payer: Medicare Other | Attending: Oncology | Admitting: Oncology

## 2019-03-01 DIAGNOSIS — N189 Chronic kidney disease, unspecified: Secondary | ICD-10-CM | POA: Insufficient documentation

## 2019-03-01 DIAGNOSIS — F17210 Nicotine dependence, cigarettes, uncomplicated: Secondary | ICD-10-CM | POA: Diagnosis not present

## 2019-03-01 DIAGNOSIS — Z79899 Other long term (current) drug therapy: Secondary | ICD-10-CM | POA: Diagnosis not present

## 2019-03-01 DIAGNOSIS — C677 Malignant neoplasm of urachus: Secondary | ICD-10-CM | POA: Insufficient documentation

## 2019-03-01 DIAGNOSIS — I129 Hypertensive chronic kidney disease with stage 1 through stage 4 chronic kidney disease, or unspecified chronic kidney disease: Secondary | ICD-10-CM | POA: Insufficient documentation

## 2019-03-01 DIAGNOSIS — Z87442 Personal history of urinary calculi: Secondary | ICD-10-CM | POA: Diagnosis not present

## 2019-03-01 DIAGNOSIS — C689 Malignant neoplasm of urinary organ, unspecified: Secondary | ICD-10-CM

## 2019-03-01 LAB — COMPREHENSIVE METABOLIC PANEL
ALT: 18 U/L (ref 0–44)
AST: 19 U/L (ref 15–41)
Albumin: 3.9 g/dL (ref 3.5–5.0)
Alkaline Phosphatase: 65 U/L (ref 38–126)
Anion gap: 9 (ref 5–15)
BUN: 18 mg/dL (ref 8–23)
CO2: 22 mmol/L (ref 22–32)
Calcium: 9.1 mg/dL (ref 8.9–10.3)
Chloride: 107 mmol/L (ref 98–111)
Creatinine, Ser: 1.05 mg/dL (ref 0.61–1.24)
GFR calc Af Amer: >60 mL/min (ref >60)
GFR calc non Af Amer: >60 mL/min (ref >60)
Glucose, Bld: 123 mg/dL — ABNORMAL HIGH (ref 70–99)
Potassium: 3.9 mmol/L (ref 3.5–5.1)
Sodium: 138 mmol/L (ref 135–145)
Total Bilirubin: 0.5 mg/dL (ref 0.3–1.2)
Total Protein: 6.8 g/dL (ref 6.5–8.1)

## 2019-03-01 NOTE — Progress Notes (Signed)
Patient is here today to establish care for his Urothelial carcinoma of kidney, right. Patient stated that he had been doing well with no complaints.

## 2019-03-04 ENCOUNTER — Telehealth: Payer: Self-pay | Admitting: Oncology

## 2019-03-04 ENCOUNTER — Other Ambulatory Visit: Payer: Medicare Other

## 2019-03-04 ENCOUNTER — Encounter
Admission: RE | Admit: 2019-03-04 | Discharge: 2019-03-04 | Disposition: A | Discharge status: Home / Self Care | Payer: Medicare Other | Source: Ambulatory Visit | Attending: Oncology | Admitting: Oncology

## 2019-03-04 ENCOUNTER — Other Ambulatory Visit: Payer: Self-pay

## 2019-03-04 DIAGNOSIS — I714 Abdominal aortic aneurysm, without rupture: Secondary | ICD-10-CM | POA: Insufficient documentation

## 2019-03-04 DIAGNOSIS — C641 Malignant neoplasm of right kidney, except renal pelvis: Secondary | ICD-10-CM | POA: Diagnosis not present

## 2019-03-04 DIAGNOSIS — Z79899 Other long term (current) drug therapy: Secondary | ICD-10-CM | POA: Diagnosis not present

## 2019-03-04 DIAGNOSIS — C689 Malignant neoplasm of urinary organ, unspecified: Secondary | ICD-10-CM | POA: Diagnosis not present

## 2019-03-04 LAB — GLUCOSE, CAPILLARY: Glucose-Capillary: 76 mg/dL (ref 70–99)

## 2019-03-04 MED ORDER — FLUDEOXYGLUCOSE F - 18 (FDG) INJECTION
15.9200 | Freq: Once | INTRAVENOUS | Status: AC | PRN
  Administered 2019-03-04: 15.92 via INTRAVENOUS

## 2019-03-04 NOTE — Progress Notes (Signed)
Tumor Board Documentation  Gerson Fauth. was presented by Dr Erlene Quan at our Tumor Board on 03/04/2019, which included representatives from medical oncology, radiation oncology, surgical oncology, surgical, radiology, pathology, navigation, genetics, pharmacy, internal medicine, research, palliative care.  Edwin Donovan currently presents as a new patient, for new positive pathology with history of the following treatments: active survellience, surgical intervention(s).  Additionally, we reviewed previous medical and familial history, history of present illness, and recent lab results along with all available histopathologic and imaging studies. The tumor board considered available treatment options and made the following recommendations: Chemotherapy, Surgery Await PET results for staging then chemotherapy and possible surgical reesction  The following procedures/referrals were also placed: No orders of the defined types were placed in this encounter.   Clinical Trial Status: not discussed   Staging used: AJCC Stage Group  AJCC Staging:       Group: Awaiting PET for staging  Urothelial Carcinoma   National site-specific guidelines NCCN were discussed with respect to the case.  Tumor board is a meeting of clinicians from various specialty areas who evaluate and discuss patients for whom a multidisciplinary approach is being considered. Final determinations in the plan of care are those of the provider(s). The responsibility for follow up of recommendations given during tumor board is that of the provider.   Today's extended care, comprehensive team conference, Edwin Donovan was not present for the discussion and was not examined.   Multidisciplinary Tumor Board is a multidisciplinary case peer review process.  Decisions discussed in the Multidisciplinary Tumor Board reflect the opinions of the specialists present at the conference without having examined the patient.  Ultimately, treatment  and diagnostic decisions rest with the primary provider(s) and the patient.

## 2019-03-04 NOTE — Progress Notes (Signed)
[place]  [phone] Fax:([phone]  ID: Edwin Donovan. OB: [birth year]  MR#: [id number]  IZT#:[id number]  Patient Care Team: Nyra Capes as PCP - General (Family Medicine)  I connected with Edwin Donovan. on 03/04/19 at  9:15 AM EDT by video enabled telemedicine visit and verified that I am speaking with the correct person using two identifiers.   I discussed the limitations, risks, security and privacy concerns of performing an evaluation and management service by telemedicine and the availability of in-person appointments. I also discussed with the patient that there may be a patient responsible charge related to this service. The patient expressed understanding and agreed to proceed.   Other persons participating in the visit and their role in the encounter: Patient, nursing  Patient's location: Home Provider's location: Clinic  CHIEF COMPLAINT: Urothelial carcinoma  INTERVAL HISTORY: Patient agreed to evaluation and treatment planning via video visit.  He continues to feel well and remains asymptomatic.  He has no neurologic complaints.  He denies any recent fevers or illnesses.  He has no chest pain, shortness of breath, cough, or hemoptysis.  He has no nausea, vomiting, constipation, or diarrhea.  He has no urinary complaints.  Patient feels at his baseline offers no specific complaints today.  REVIEW OF SYSTEMS:   Review of Systems  Constitutional: Negative.  Negative for fever, malaise/fatigue and weight loss.  Respiratory: Negative.  Negative for cough, hemoptysis and shortness of breath.   Cardiovascular: Negative.  Negative for chest pain and leg swelling.  Gastrointestinal: Negative.  Negative for abdominal pain.  Genitourinary: Negative.  Negative for dysuria, flank pain and hematuria.  Musculoskeletal: Negative.  Negative for back pain.  Skin: Negative.  Negative for rash.  Neurological: Negative.   Negative for dizziness, focal weakness, weakness and headaches.  Psychiatric/Behavioral: Negative.  The patient is not nervous/anxious.     As per HPI. Otherwise, a complete review of systems is negative.  PAST MEDICAL HISTORY: Past Medical History:  Diagnosis Date  . Anxiety    ptsd  . Arthritis   . Cancer ([place])    PROSTATE  . Chronic kidney disease    stones  . COPD (chronic obstructive pulmonary disease) ([place])   . Dysrhythmia   . History of kidney stones   . Hypertension   . Kidney stone   . Pneumonia    06/27/16  . Shortness of breath dyspnea     PAST SURGICAL HISTORY: Past Surgical History:  Procedure Laterality Date  . CYSTOSCOPY W/ RETROGRADES Bilateral 12/26/2017   Procedure: CYSTOSCOPY WITH RETROGRADE PYELOGRAM;  Surgeon: Abbie Sons, MD;  Location: [facility];  Service: Urology;  Laterality: Bilateral;  . CYSTOSCOPY W/ RETROGRADES Right 02/09/2019   Procedure: CYSTOSCOPY WITH RETROGRADE PYELOGRAM;  Surgeon: Abbie Sons, MD;  Location: [facility];  Service: Urology;  Laterality: Right;  . CYSTOSCOPY W/ URETERAL STENT PLACEMENT Right 07/29/2016   Procedure: CYSTOSCOPY WITH STENT REPLACEMENT;  Surgeon: Hollice Espy, MD;  Location: [facility];  Service: Urology;  Laterality: Right;  . CYSTOSCOPY WITH BIOPSY Right 07/29/2016   Procedure: CYSTOSCOPY WITH BIOPSY;  Surgeon: Hollice Espy, MD;  Location: [facility];  Service: Urology;  Laterality: Right;  . CYSTOSCOPY WITH BIOPSY Right 02/09/2019   Procedure: CYSTOSCOPY WITH BIOPSY;  Surgeon: Abbie Sons, MD;  Location: [facility];  Service: Urology;  Laterality: Right;  . CYSTOSCOPY WITH STENT PLACEMENT Right 06/28/2016   Procedure: CYSTOSCOPY WITH STENT PLACEMENT;  Surgeon: Hollice Espy, MD;  Location: [facility];  Service: Urology;  Laterality: Right;  . CYSTOSCOPY WITH STENT PLACEMENT Right 02/09/2019   Procedure: CYSTOSCOPY WITH STENT PLACEMENT;  Surgeon: Abbie Sons, MD;  Location: [facility];  Service: Urology;   Laterality: Right;  . CYSTOSCOPY/URETEROSCOPY/HOLMIUM LASER/STENT PLACEMENT Right 12/26/2017   Procedure: CYSTOSCOPY/URETEROSCOPY/HOLMIUM LASER/STENT PLACEMENT;  Surgeon: Abbie Sons, MD;  Location: [facility];  Service: Urology;  Laterality: Right;  . EXTRACORPOREAL SHOCK WAVE LITHOTRIPSY Right 12/04/2017   Procedure: EXTRACORPOREAL SHOCK WAVE LITHOTRIPSY (ESWL);  Surgeon: Hollice Espy, MD;  Location: [facility];  Service: Urology;  Laterality: Right;  . PROSTATECTOMY    . URETEROSCOPY Right 07/29/2016   Procedure: URETEROSCOPY;  Surgeon: Hollice Espy, MD;  Location: [facility];  Service: Urology;  Laterality: Right;  . URETEROSCOPY Right 02/09/2019   Procedure: URETEROSCOPY- DIAGNOSTIC;  Surgeon: Abbie Sons, MD;  Location: [facility];  Service: Urology;  Laterality: Right;    FAMILY HISTORY: Family History  Problem Relation Age of Onset  . Stroke Mother   . Heart attack Father   . Lung cancer Father   . Kidney disease Father   . Prostate cancer Neg Hx   . Bladder Cancer Neg Hx     ADVANCED DIRECTIVES (Y/N):  N  HEALTH MAINTENANCE: Social History   Tobacco Use  . Smoking status: Current Some Day Smoker    Packs/day: 0.50    Types: Cigarettes  . Smokeless tobacco: Never Used  Substance Use Topics  . Alcohol use: Yes    Alcohol/week: 14.0 standard drinks    Types: 14 Cans of beer per week  . Drug use: Yes    Types: Marijuana    Comment: "sometimes, not very often"     Colonoscopy:  PAP:  Bone density:  Lipid panel:  No Known Allergies  Current Outpatient Medications  Medication Sig Dispense Refill  . albuterol (PROVENTIL HFA;VENTOLIN HFA) 108 (90 Base) MCG/ACT inhaler Inhale 1-2 puffs into the lungs every 6 (six) hours as needed for wheezing or shortness of breath.    Marland Kitchen amLODipine (NORVASC) 10 MG tablet Take 10 mg by mouth daily.    . budesonide-formoterol (SYMBICORT) 160-4.5 MCG/ACT inhaler Inhale 2 puffs into the lungs 2 (two) times daily.    Marland Kitchen lisinopril  (PRINIVIL,ZESTRIL) 40 MG tablet Take 40 mg by mouth daily.    . metoprolol (TOPROL-XL) 200 MG 24 hr tablet Take 200 mg by mouth daily.    Marland Kitchen oxybutynin (DITROPAN) 5 MG tablet 1 tab tid prn frequency,urgency, bladder spasm (Patient not taking: Reported on 03/01/2019) 30 tablet 0  . oxyCODONE-acetaminophen (PERCOCET) 5-325 MG tablet Take 1 tablet by mouth every 8 (eight) hours as needed. (Patient not taking: Reported on 03/01/2019) 15 tablet 0   No current facility-administered medications for this visit.     OBJECTIVE: There were no vitals filed for this visit.   There is no height or weight on file to calculate BMI.    ECOG FS:0 - Asymptomatic  General: Well-developed, well-nourished, no acute distress. HEENT: Normocephalic, moist mucous membranes. Neuro: Alert, answering all questions appropriately. Cranial nerves grossly intact. Skin: No rashes or petechiae noted. Psych: Normal affect.  LAB RESULTS:  Lab Results  Component Value Date   NA 138 03/01/2019   K 3.9 03/01/2019   CL 107 03/01/2019   CO2 22 03/01/2019   GLUCOSE 123 (H) 03/01/2019   BUN 18 03/01/2019   CREATININE 1.05 03/01/2019   CALCIUM 9.1 03/01/2019   PROT 6.8 03/01/2019   ALBUMIN 3.9 03/01/2019  AST 19 03/01/2019   ALT 18 03/01/2019   ALKPHOS 65 03/01/2019   BILITOT 0.5 03/01/2019   GFRNONAA >60 03/01/2019   GFRAA >60 03/01/2019    Lab Results  Component Value Date   WBC 11.4 (H) 01/25/2019   NEUTROABS 5.3 11/23/2017   HGB 15.2 01/25/2019   HCT 45.3 01/25/2019   MCV 99.8 01/25/2019   PLT 178 01/25/2019     STUDIES: No results found.  ASSESSMENT: Urothelial carcinoma  PLAN:    1. Urothelial carcinoma: Imaging and pathology results reviewed independently.  Staging is unclear, but patient is least stage II or III.  PET scan results from March 04, 2019 reviewed independently with no obvious evidence of malignancy outside patient is known renal pelvis mass.  Patient will receive neoadjuvant  chemotherapy using cisplatin on day 1 with gemcitabine on day 1 and 8.  This will be a 21-day cycle.  Plan to do 4 cycles and then refer back to urology for right nephroureterectomy.  Port placement was discussed, but is not necessary at this point.  Return to clinic in 1 week for further evaluation and consideration of cycle 1, day 1 of cisplatin and gemcitabine.   2.  Renal insufficiency: Resolved.  Patient's creatinine is 1.05.  I provided 30 minutes of face-to-face video visit time during this encounter, and > 50% was spent counseling as documented under my assessment & plan.   Patient expressed understanding and was in agreement with this plan. He also understands that He can call clinic at any time with any questions, concerns, or complaints.   Cancer Staging No matching staging information was found for the patient.  Lloyd Huger, MD   03/04/2019 7:01 AM

## 2019-03-05 ENCOUNTER — Encounter: Payer: Self-pay | Admitting: Oncology

## 2019-03-05 ENCOUNTER — Other Ambulatory Visit: Payer: Self-pay | Admitting: Oncology

## 2019-03-05 ENCOUNTER — Other Ambulatory Visit: Payer: Self-pay

## 2019-03-05 ENCOUNTER — Inpatient Hospital Stay (HOSPITAL_BASED_OUTPATIENT_CLINIC_OR_DEPARTMENT_OTHER): Payer: Medicare Other | Admitting: Oncology

## 2019-03-05 DIAGNOSIS — F17210 Nicotine dependence, cigarettes, uncomplicated: Secondary | ICD-10-CM

## 2019-03-05 DIAGNOSIS — Z79899 Other long term (current) drug therapy: Secondary | ICD-10-CM

## 2019-03-05 DIAGNOSIS — N189 Chronic kidney disease, unspecified: Secondary | ICD-10-CM

## 2019-03-05 DIAGNOSIS — C677 Malignant neoplasm of urachus: Secondary | ICD-10-CM | POA: Diagnosis not present

## 2019-03-05 DIAGNOSIS — C689 Malignant neoplasm of urinary organ, unspecified: Secondary | ICD-10-CM

## 2019-03-05 DIAGNOSIS — Z87442 Personal history of urinary calculi: Secondary | ICD-10-CM | POA: Diagnosis not present

## 2019-03-05 DIAGNOSIS — I129 Hypertensive chronic kidney disease with stage 1 through stage 4 chronic kidney disease, or unspecified chronic kidney disease: Secondary | ICD-10-CM

## 2019-03-05 MED ORDER — PROCHLORPERAZINE MALEATE 10 MG PO TABS: 10.0000 mg | ORAL_TABLET | Freq: Four times a day (QID) | ORAL | 2 refills | Status: DC | PRN

## 2019-03-05 MED ORDER — ONDANSETRON HCL 8 MG PO TABS: 8.0000 mg | ORAL_TABLET | Freq: Two times a day (BID) | ORAL | 2 refills | Status: DC | PRN

## 2019-03-05 NOTE — Progress Notes (Signed)
Patient stated that he had been doing well with no complaints. Patient is ready to get his PET Scan result.

## 2019-03-05 NOTE — Progress Notes (Signed)
START OFF PATHWAY REGIMEN - Bladder   [id number]:Gemcitabine + Cisplatin every 21 days:   A cycle is every 21 days:     Gemcitabine      Cisplatin   **Always confirm dose/schedule in your pharmacy ordering system**  Patient Characteristics: Pre-Cystectomy or Nonsurgical Candidate (Clinical Staging), cT2-4a, cN0-1, M0, Bladder-Sparing Approach Therapeutic Status: Pre-Cystectomy or Nonsurgical Candidate (Clinical Staging) AJCC M Category: cM0 AJCC 8 Stage Grouping: Unknown AJCC T Category: cTX AJCC N Category: cN0 Intent of Therapy: Curative Intent, Discussed with Patient

## 2019-03-08 ENCOUNTER — Telehealth: Payer: Self-pay

## 2019-03-08 ENCOUNTER — Telehealth: Payer: Self-pay | Admitting: Urology

## 2019-03-08 ENCOUNTER — Telehealth: Payer: Self-pay | Admitting: *Deleted

## 2019-03-08 NOTE — Telephone Encounter (Signed)
Spoke with patient on telephone about his chemotherapy education class and patient in agreement to education by telephone on Thursday April 23rd at 1:00.  Informed patient I am mailing his information about his chemotherapy and treatments and will discuss on Thursday.

## 2019-03-08 NOTE — Telephone Encounter (Signed)
Pt called and states that the [facility] in [place] needs documentation of the patients cancer diagnosis. Fax # [phone] Attn: Dr Delice Lesch. Please advise.

## 2019-03-08 NOTE — Telephone Encounter (Signed)
Patient left vm stating that the [facility] in [place] needs documentation of the what chemotherapy he would be receiving faxed for consideration of coverage. Fax # [phone] Attn: Dr Delice Lesch. Please advise

## 2019-03-08 NOTE — Patient Instructions (Signed)
Cisplatin injection  What is this medicine?  CISPLATIN (SIS pla tin) is a chemotherapy drug. It targets fast dividing cells, like cancer cells, and causes these cells to die. This medicine is used to treat many types of cancer like bladder, ovarian, and testicular cancers.  This medicine may be used for other purposes; ask your health care provider or pharmacist if you have questions.  COMMON BRAND NAME(S): Platinol, Platinol -AQ  What should I tell my health care provider before I take this medicine?  They need to know if you have any of these conditions:  -blood disorders  -hearing problems  -kidney disease  -recent or ongoing radiation therapy  -an unusual or allergic reaction to cisplatin, carboplatin, other chemotherapy, other medicines, foods, dyes, or preservatives  -pregnant or trying to get pregnant  -breast-feeding  How should I use this medicine?  This drug is given as an infusion into a vein. It is administered in a hospital or clinic by a specially trained health care professional.  Talk to your pediatrician regarding the use of this medicine in children. Special care may be needed.  Overdosage: If you think you have taken too much of this medicine contact a poison control center or emergency room at once.  NOTE: This medicine is only for you. Do not share this medicine with others.  What if I miss a dose?  It is important not to miss a dose. Call your doctor or health care professional if you are unable to keep an appointment.  What may interact with this medicine?  -dofetilide  -foscarnet  -medicines for seizures  -medicines to increase blood counts like filgrastim, pegfilgrastim, sargramostim  -probenecid  -pyridoxine used with altretamine  -rituximab  -some antibiotics like amikacin, gentamicin, neomycin, polymyxin B, streptomycin, tobramycin  -sulfinpyrazone  -vaccines  -zalcitabine  Talk to your doctor or health care professional before taking any of these  medicines:  -acetaminophen  -aspirin  -ibuprofen  -ketoprofen  -naproxen  This list may not describe all possible interactions. Give your health care provider a list of all the medicines, herbs, non-prescription drugs, or dietary supplements you use. Also tell them if you smoke, drink alcohol, or use illegal drugs. Some items may interact with your medicine.  What should I watch for while using this medicine?  Your condition will be monitored carefully while you are receiving this medicine. You will need important blood work done while you are taking this medicine.  This drug may make you feel generally unwell. This is not uncommon, as chemotherapy can affect healthy cells as well as cancer cells. Report any side effects. Continue your course of treatment even though you feel ill unless your doctor tells you to stop.  In some cases, you may be given additional medicines to help with side effects. Follow all directions for their use.  Call your doctor or health care professional for advice if you get a fever, chills or sore throat, or other symptoms of a cold or flu. Do not treat yourself. This drug decreases your body's ability to fight infections. Try to avoid being around people who are sick.  This medicine may increase your risk to bruise or bleed. Call your doctor or health care professional if you notice any unusual bleeding.  Be careful brushing and flossing your teeth or using a toothpick because you may get an infection or bleed more easily. If you have any dental work done, tell your dentist you are receiving this medicine.  Avoid taking products   that contain aspirin, acetaminophen, ibuprofen, naproxen, or ketoprofen unless instructed by your doctor. These medicines may hide a fever.  Do not become pregnant while taking this medicine. Women should inform their doctor if they wish to become pregnant or think they might be pregnant. There is a potential for serious side effects to an unborn child. Talk to  your health care professional or pharmacist for more information. Do not breast-feed an infant while taking this medicine.  Drink fluids as directed while you are taking this medicine. This will help protect your kidneys.  Call your doctor or health care professional if you get diarrhea. Do not treat yourself.  What side effects may I notice from receiving this medicine?  Side effects that you should report to your doctor or health care professional as soon as possible:  -allergic reactions like skin rash, itching or hives, swelling of the face, lips, or tongue  -signs of infection - fever or chills, cough, sore throat, pain or difficulty passing urine  -signs of decreased platelets or bleeding - bruising, pinpoint red spots on the skin, black, tarry stools, nosebleeds  -signs of decreased red blood cells - unusually weak or tired, fainting spells, lightheadedness  -breathing problems  -changes in hearing  -gout pain  -low blood counts - This drug may decrease the number of white blood cells, red blood cells and platelets. You may be at increased risk for infections and bleeding.  -nausea and vomiting  -pain, swelling, redness or irritation at the injection site  -pain, tingling, numbness in the hands or feet  -problems with balance, movement  -trouble passing urine or change in the amount of urine  Side effects that usually do not require medical attention (report to your doctor or health care professional if they continue or are bothersome):  -changes in vision  -loss of appetite  -metallic taste in the mouth or changes in taste  This list may not describe all possible side effects. Call your doctor for medical advice about side effects. You may report side effects to FDA at 1-800-FDA-1088.  Where should I keep my medicine?  This drug is given in a hospital or clinic and will not be stored at home.  NOTE: This sheet is a summary. It may not cover all possible information. If you have questions about this medicine,  talk to your doctor, pharmacist, or health care provider.   2019 Elsevier/Gold Standard (2008-02-09 14:40:54)    Gemcitabine injection  What is this medicine?  GEMCITABINE (jem SYE ta been) is a chemotherapy drug. This medicine is used to treat many types of cancer like breast cancer, lung cancer, pancreatic cancer, and ovarian cancer.  This medicine may be used for other purposes; ask your health care provider or pharmacist if you have questions.  COMMON BRAND NAME(S): Gemzar, Infugem  What should I tell my health care provider before I take this medicine?  They need to know if you have any of these conditions:  -blood disorders  -infection  -kidney disease  -liver disease  -lung or breathing disease, like asthma  -recent or ongoing radiation therapy  -an unusual or allergic reaction to gemcitabine, other chemotherapy, other medicines, foods, dyes, or preservatives  -pregnant or trying to get pregnant  -breast-feeding  How should I use this medicine?  This drug is given as an infusion into a vein. It is administered in a hospital or clinic by a specially trained health care professional.  Talk to your pediatrician regarding the use of   this medicine in children. Special care may be needed.  Overdosage: If you think you have taken too much of this medicine contact a poison control center or emergency room at once.  NOTE: This medicine is only for you. Do not share this medicine with others.  What if I miss a dose?  It is important not to miss your dose. Call your doctor or health care professional if you are unable to keep an appointment.  What may interact with this medicine?  -medicines to increase blood counts like filgrastim, pegfilgrastim, sargramostim  -some other chemotherapy drugs like cisplatin  -vaccines  Talk to your doctor or health care professional before taking any of these medicines:  -acetaminophen  -aspirin  -ibuprofen  -ketoprofen  -naproxen  This list may not describe all possible interactions.  Give your health care provider a list of all the medicines, herbs, non-prescription drugs, or dietary supplements you use. Also tell them if you smoke, drink alcohol, or use illegal drugs. Some items may interact with your medicine.  What should I watch for while using this medicine?  Visit your doctor for checks on your progress. This drug may make you feel generally unwell. This is not uncommon, as chemotherapy can affect healthy cells as well as cancer cells. Report any side effects. Continue your course of treatment even though you feel ill unless your doctor tells you to stop.  In some cases, you may be given additional medicines to help with side effects. Follow all directions for their use.  Call your doctor or health care professional for advice if you get a fever, chills or sore throat, or other symptoms of a cold or flu. Do not treat yourself. This drug decreases your body's ability to fight infections. Try to avoid being around people who are sick.  This medicine may increase your risk to bruise or bleed. Call your doctor or health care professional if you notice any unusual bleeding.  Be careful brushing and flossing your teeth or using a toothpick because you may get an infection or bleed more easily. If you have any dental work done, tell your dentist you are receiving this medicine.  Avoid taking products that contain aspirin, acetaminophen, ibuprofen, naproxen, or ketoprofen unless instructed by your doctor. These medicines may hide a fever.  Do not become pregnant while taking this medicine or for 6 months after stopping it. Women should inform their doctor if they wish to become pregnant or think they might be pregnant. Men should not father a child while taking this medicine and for 3 months after stopping it. There is a potential for serious side effects to an unborn child. Talk to your health care professional or pharmacist for more information. Do not breast-feed an infant while taking this  medicine or for at least 1 week after stopping it.  Men should inform their doctors if they wish to father a child. This medicine may lower sperm counts. Talk with your doctor or health care professional if you are concerned about your fertility.  What side effects may I notice from receiving this medicine?  Side effects that you should report to your doctor or health care professional as soon as possible:  -allergic reactions like skin rash, itching or hives, swelling of the face, lips, or tongue  -breathing problems  -pain, redness, or irritation at site where injected  -signs and symptoms of a dangerous change in heartbeat or heart rhythm like chest pain; dizziness; fast or irregular heartbeat; palpitations; feeling   faint or lightheaded, falls; breathing problems  -signs of decreased platelets or bleeding - bruising, pinpoint red spots on the skin, black, tarry stools, blood in the urine  -signs of decreased red blood cells - unusually weak or tired, feeling faint or lightheaded, falls  -signs of infection - fever or chills, cough, sore throat, pain or difficulty passing urine  -signs and symptoms of kidney injury like trouble passing urine or change in the amount of urine  -signs and symptoms of liver injury like dark yellow or brown urine; general ill feeling or flu-like symptoms; light-colored stools; loss of appetite; nausea; right upper belly pain; unusually weak or tired; yellowing of the eyes or skin  -swelling of ankles, feet, hands  Side effects that usually do not require medical attention (report to your doctor or health care professional if they continue or are bothersome):  -constipation  -diarrhea  -hair loss  -loss of appetite  -nausea  -rash  -vomiting  This list may not describe all possible side effects. Call your doctor for medical advice about side effects. You may report side effects to FDA at 1-800-FDA-1088.  Where should I keep my medicine?  This drug is given in a hospital or clinic and  will not be stored at home.  NOTE: This sheet is a summary. It may not cover all possible information. If you have questions about this medicine, talk to your doctor, pharmacist, or health care provider.   2019 Elsevier/Gold Standard (2018-01-28 18:06:11)

## 2019-03-09 NOTE — Telephone Encounter (Signed)
Returned call to patient. He again asked that we fax confirmation of cancer diagnosis to Dr Delice Lesch at [facility] in [place]. Cytology report faxed per patient request.

## 2019-03-09 NOTE — Telephone Encounter (Signed)
Tillie Rung, I sent this to [facility] yesterday in error.   He has called several times today.

## 2019-03-10 ENCOUNTER — Other Ambulatory Visit: Payer: Self-pay

## 2019-03-10 ENCOUNTER — Inpatient Hospital Stay: Payer: Medicare Other

## 2019-03-10 ENCOUNTER — Telehealth: Payer: Self-pay | Admitting: *Deleted

## 2019-03-10 NOTE — Telephone Encounter (Signed)
-----   Message from Clancy Gourd sent at 03/10/2019 12:07 PM EDT ----- Regarding: Patient Requests Callback Contact: [phone] Hello,  This pt is requesting a callback from you about a prescription from Dr. Grayland Ormond that he needs to have sent over to the [facility], attn: Dr. Delice Lesch at fax [phone]. Can you please call him at [phone]? Thanks.

## 2019-03-10 NOTE — Telephone Encounter (Signed)
I was on PAL yesterday, not back in clinic until Monday. Is there anyone that can fax this information? Thanks.

## 2019-03-10 NOTE — Telephone Encounter (Signed)
Call returned to patient, patient requesting information on chemotherapy treatment be faxed to Dr. Delice Lesch at [facility]. I let patient know that this information should have been received earlier today from the [facility].

## 2019-03-10 NOTE — Telephone Encounter (Signed)
Requested documents were faxed to the [place] (Dr. Delice Lesch).

## 2019-03-11 ENCOUNTER — Inpatient Hospital Stay: Payer: Medicare Other

## 2019-03-12 ENCOUNTER — Inpatient Hospital Stay: Payer: Medicare Other

## 2019-03-12 ENCOUNTER — Inpatient Hospital Stay: Payer: Medicare Other | Admitting: Oncology

## 2019-03-12 ENCOUNTER — Telehealth: Payer: Self-pay | Admitting: *Deleted

## 2019-03-12 NOTE — Progress Notes (Deleted)
[place]  [phone] [phone]  ID: Edwin Donovan. OB: 1946-04-23  MR#: [id number]  BPZ#:[id number]  Patient Care Team: Nyra Capes as PCP - General (Family Medicine)  I connected with Edwin Donovan. on 03/12/19 at  8:30 AM EDT by video enabled telemedicine visit and verified that I am speaking with the correct person using two identifiers.   I discussed the limitations, risks, security and privacy concerns of performing an evaluation and management service by telemedicine and the availability of in-person appointments. I also discussed with the patient that there may be a patient responsible charge related to this service. The patient expressed understanding and agreed to proceed.   Other persons participating in the visit and their role in the encounter: Patient, nursing  Patients location: Home Providers location: Clinic  CHIEF COMPLAINT: Urothelial carcinoma  INTERVAL HISTORY: Patient agreed to evaluation and treatment planning via video visit.  He continues to feel well and remains asymptomatic.  He has no neurologic complaints.  He denies any recent fevers or illnesses.  He has no chest pain, shortness of breath, cough, or hemoptysis.  He has no nausea, vomiting, constipation, or diarrhea.  He has no urinary complaints.  Patient feels at his baseline offers no specific complaints today.  REVIEW OF SYSTEMS:   Review of Systems  Constitutional: Negative.  Negative for fever, malaise/fatigue and weight loss.  Respiratory: Negative.  Negative for cough, hemoptysis and shortness of breath.   Cardiovascular: Negative.  Negative for chest pain and leg swelling.  Gastrointestinal: Negative.  Negative for abdominal pain.  Genitourinary: Negative.  Negative for dysuria, flank pain and hematuria.  Musculoskeletal: Negative.  Negative for back pain.  Skin: Negative.  Negative for rash.  Neurological: Negative.   Negative for dizziness, focal weakness, weakness and headaches.  Psychiatric/Behavioral: Negative.  The patient is not nervous/anxious.     As per HPI. Otherwise, a complete review of systems is negative.  PAST MEDICAL HISTORY: Past Medical History:  Diagnosis Date   Anxiety    ptsd   Arthritis    Cancer ([place])    PROSTATE   Chronic kidney disease    stones   COPD (chronic obstructive pulmonary disease) ([place])    Dysrhythmia    History of kidney stones    Hypertension    Kidney stone    Pneumonia    06/27/16   Shortness of breath dyspnea     PAST SURGICAL HISTORY: Past Surgical History:  Procedure Laterality Date   CYSTOSCOPY W/ RETROGRADES Bilateral 12/26/2017   Procedure: CYSTOSCOPY WITH RETROGRADE PYELOGRAM;  Surgeon: Abbie Sons, MD;  Location: [facility];  Service: Urology;  Laterality: Bilateral;   CYSTOSCOPY W/ RETROGRADES Right 02/09/2019   Procedure: CYSTOSCOPY WITH RETROGRADE PYELOGRAM;  Surgeon: Abbie Sons, MD;  Location: [facility];  Service: Urology;  Laterality: Right;   CYSTOSCOPY W/ URETERAL STENT PLACEMENT Right 07/29/2016   Procedure: CYSTOSCOPY WITH STENT REPLACEMENT;  Surgeon: Hollice Espy, MD;  Location: [facility];  Service: Urology;  Laterality: Right;   CYSTOSCOPY WITH BIOPSY Right 07/29/2016   Procedure: CYSTOSCOPY WITH BIOPSY;  Surgeon: Hollice Espy, MD;  Location: [facility];  Service: Urology;  Laterality: Right;   CYSTOSCOPY WITH BIOPSY Right 02/09/2019   Procedure: CYSTOSCOPY WITH BIOPSY;  Surgeon: Abbie Sons, MD;  Location: [facility];  Service: Urology;  Laterality: Right;   CYSTOSCOPY WITH STENT PLACEMENT Right 06/28/2016   Procedure: CYSTOSCOPY WITH STENT PLACEMENT;  Surgeon: Hollice Espy, MD;  Location: [facility];  Service: Urology;  Laterality: Right;   CYSTOSCOPY WITH STENT PLACEMENT Right 02/09/2019   Procedure: CYSTOSCOPY WITH STENT PLACEMENT;  Surgeon: Abbie Sons, MD;  Location: [facility];  Service: Urology;   Laterality: Right;   CYSTOSCOPY/URETEROSCOPY/HOLMIUM LASER/STENT PLACEMENT Right 12/26/2017   Procedure: CYSTOSCOPY/URETEROSCOPY/HOLMIUM LASER/STENT PLACEMENT;  Surgeon: Abbie Sons, MD;  Location: [facility];  Service: Urology;  Laterality: Right;   EXTRACORPOREAL SHOCK WAVE LITHOTRIPSY Right 12/04/2017   Procedure: EXTRACORPOREAL SHOCK WAVE LITHOTRIPSY (ESWL);  Surgeon: Hollice Espy, MD;  Location: [facility];  Service: Urology;  Laterality: Right;   PROSTATECTOMY     URETEROSCOPY Right 07/29/2016   Procedure: URETEROSCOPY;  Surgeon: Hollice Espy, MD;  Location: [facility];  Service: Urology;  Laterality: Right;   URETEROSCOPY Right 02/09/2019   Procedure: URETEROSCOPY- DIAGNOSTIC;  Surgeon: Abbie Sons, MD;  Location: [facility];  Service: Urology;  Laterality: Right;    FAMILY HISTORY: Family History  Problem Relation Age of Onset   Stroke Mother    Heart attack Father    Lung cancer Father    Kidney disease Father    Prostate cancer Neg Hx    Bladder Cancer Neg Hx     ADVANCED DIRECTIVES (Y/N):  N  HEALTH MAINTENANCE: Social History   Tobacco Use   Smoking status: Current Some Day Smoker    Packs/day: 0.50    Types: Cigarettes   Smokeless tobacco: Never Used  Substance Use Topics   Alcohol use: Yes    Alcohol/week: 14.0 standard drinks    Types: 14 Cans of beer per week   Drug use: Yes    Types: Marijuana    Comment: "sometimes, not very often"     Colonoscopy:  PAP:  Bone density:  Lipid panel:  No Known Allergies  Current Outpatient Medications  Medication Sig Dispense Refill   albuterol (PROVENTIL HFA;VENTOLIN HFA) 108 (90 Base) MCG/ACT inhaler Inhale 1-2 puffs into the lungs every 6 (six) hours as needed for wheezing or shortness of breath.     amLODipine (NORVASC) 10 MG tablet Take 10 mg by mouth daily.     budesonide-formoterol (SYMBICORT) 160-4.5 MCG/ACT inhaler Inhale 2 puffs into the lungs 2 (two) times daily.     lisinopril  (PRINIVIL,ZESTRIL) 40 MG tablet Take 40 mg by mouth daily.     metoprolol (TOPROL-XL) 200 MG 24 hr tablet Take 200 mg by mouth daily.     ondansetron (ZOFRAN) 8 MG tablet Take 1 tablet (8 mg total) by mouth 2 (two) times daily as needed. Start on the third day after cisplatin chemotherapy. 30 tablet 2   oxybutynin (DITROPAN) 5 MG tablet 1 tab tid prn frequency,urgency, bladder spasm 30 tablet 0   oxyCODONE-acetaminophen (PERCOCET) 5-325 MG tablet Take 1 tablet by mouth every 8 (eight) hours as needed. 15 tablet 0   prochlorperazine (COMPAZINE) 10 MG tablet Take 1 tablet (10 mg total) by mouth every 6 (six) hours as needed (Nausea or vomiting). 60 tablet 2   No current facility-administered medications for this visit.     OBJECTIVE: There were no vitals filed for this visit.   There is no height or weight on file to calculate BMI.    ECOG FS:0 - Asymptomatic  General: Well-developed, well-nourished, no acute distress. HEENT: Normocephalic, moist mucous membranes. Neuro: Alert, answering all questions appropriately. Cranial nerves grossly intact. Skin: No rashes or petechiae noted. Psych: Normal affect.  LAB RESULTS:  Lab Results  Component Value Date   NA 138 03/01/2019  K 3.9 03/01/2019   CL 107 03/01/2019   CO2 22 03/01/2019   GLUCOSE 123 (H) 03/01/2019   BUN 18 03/01/2019   CREATININE 1.05 03/01/2019   CALCIUM 9.1 03/01/2019   PROT 6.8 03/01/2019   ALBUMIN 3.9 03/01/2019   AST 19 03/01/2019   ALT 18 03/01/2019   ALKPHOS 65 03/01/2019   BILITOT 0.5 03/01/2019   GFRNONAA >60 03/01/2019   GFRAA >60 03/01/2019    Lab Results  Component Value Date   WBC 11.4 (H) 01/25/2019   NEUTROABS 5.3 11/23/2017   HGB 15.2 01/25/2019   HCT 45.3 01/25/2019   MCV 99.8 01/25/2019   PLT 178 01/25/2019     STUDIES: Nm Pet Image Initial (pi) Skull Base To Thigh  Result Date: 03/05/2019 CLINICAL DATA:  Initial treatment strategy for right renal high-grade urothelial carcinoma.  EXAM: NUCLEAR MEDICINE PET SKULL BASE TO THIGH TECHNIQUE: 15.9 mCi F-18 FDG was injected intravenously. Full-ring PET imaging was performed from the skull base to thigh after the radiotracer. CT data was obtained and used for attenuation correction and anatomic localization. Fasting blood glucose: 76 mg/dl COMPARISON:  CT on 01/06/2019 FINDINGS: (Background mediastinal blood pool activity: SUV max = 2.7) NECK: 6 mm left level II B lymph node is hypermetabolic, with SUV max of 3.5. No other hypermetabolic lymph nodes or masses are seen in the neck. Incidental CT findings:  None. CHEST: No hypermetabolic masses or lymphadenopathy. No suspicious pulmonary nodules seen on CT images. Incidental CT findings: Mild centrilobular emphysema. Aortic and coronary artery atherosclerosis. ABDOMEN/PELVIS: No abnormal hypermetabolic activity within the liver, pancreas, adrenal glands, or spleen. Right ureteral stent is seen in appropriate position. Hypermetabolic activity is seen within the right intrarenal collecting system which corresponds with site of known urothelial carcinoma. SUV max measures 25.3. No hypermetabolic lymph nodes in the abdomen or pelvis. Incidental CT findings: Stable postop changes from previous prostatectomy. Stable 3.4 cm abdominal aortic aneurysm. Diverticulosis is seen mainly involving the sigmoid colon, however there is no evidence of diverticulitis. SKELETON: No focal hypermetabolic bone lesions to suggest skeletal metastasis. Incidental CT findings:  None. IMPRESSION: Asymmetric hypermetabolic activity in the right intrarenal collecting system, corresponding with known urothelial carcinoma. No definite evidence of local or distant metastatic disease. 6 mm left cervical hypermetabolic lymph node is likely reactive in etiology, and of doubtful clinical significance. Recommend follow-up by neck CT in 6 months. Stable 3.4 cm abdominal aortic aneurysm. Recommend followup by ultrasound in 3 years. This  recommendation follows ACR consensus guidelines: White Paper of the ACR Incidental Findings Committee II on Vascular Findings. [facility]iol 2013; 10:789-794 Electronically Signed   By: Earle Gell M.D.   On: 03/05/2019 08:03    ASSESSMENT: Urothelial carcinoma  PLAN:    1. Urothelial carcinoma: Imaging and pathology results reviewed independently.  Staging is unclear, but patient is least stage II or III.  PET scan results from March 04, 2019 reviewed independently with no obvious evidence of malignancy outside patient is known renal pelvis mass.  Patient will receive neoadjuvant chemotherapy using cisplatin on day 1 with gemcitabine on day 1 and 8.  This will be a 21-day cycle.  Plan to do 4 cycles and then refer back to urology for right nephroureterectomy.  Port placement was discussed, but is not necessary at this point.  Return to clinic in 1 week for further evaluation and consideration of cycle 1, day 1 of cisplatin and gemcitabine.   2.  Renal insufficiency: Resolved.  Patient's  creatinine is 1.05.  I provided 30 minutes of face-to-face video visit time during this encounter, and > 50% was spent counseling as documented under my assessment & plan.   Patient expressed understanding and was in agreement with this plan. He also understands that He can call clinic at any time with any questions, concerns, or complaints.   Cancer Staging No matching staging information was found for the patient.  Lloyd Huger, MD   03/12/2019 6:51 AM

## 2019-03-12 NOTE — Telephone Encounter (Signed)
Patient called and asked that his appointment for treatment today be cancelled. He states [facility] will not pay for it and he cannot afford to pay for it.

## 2019-04-26 ENCOUNTER — Telehealth: Payer: Self-pay | Admitting: Radiology

## 2019-04-26 NOTE — Telephone Encounter (Signed)
LMOM. Need to make an appointment with Dr Erlene Quan for follow up on possible surgery.

## 2019-05-20 ENCOUNTER — Telehealth: Payer: Self-pay | Admitting: Radiology

## 2019-05-20 NOTE — Telephone Encounter (Signed)
Patient unavailable to take call. Needs appt with Dr Erlene Quan on 7/7 @3 :50.

## 2019-05-20 NOTE — Telephone Encounter (Signed)
LMOM for daughter to have patient return call.

## 2019-05-20 NOTE — Telephone Encounter (Signed)
-----   Message from Hollice Espy, MD sent at 05/19/2019 12:03 PM EDT ----- Regarding: RE: follow up appt Yes, please have him come in for visit next week.    ----- Message ----- From: Ranell Patrick, RN Sent: 05/19/2019  10:56 AM EDT To: Hollice Espy, MD Subject: follow up appt                                 This patient cancelled chemo due to cost. Does he need to follow up with you?

## 2019-05-20 NOTE — Telephone Encounter (Signed)
Changed appt to 7/8 at 9:00 per Dr Erlene Quan.

## 2019-05-25 ENCOUNTER — Ambulatory Visit: Payer: Medicare Other | Admitting: Urology

## 2019-05-26 ENCOUNTER — Ambulatory Visit: Payer: Medicare Other | Admitting: Urology

## 2019-05-26 ENCOUNTER — Telehealth: Payer: Self-pay | Admitting: Urology

## 2019-05-26 NOTE — Telephone Encounter (Signed)
Pt called and cancelled his appt for today and also states that he has transferred care to [place] in [place].

## 2019-07-15 DIAGNOSIS — Z483 Aftercare following surgery for neoplasm: Secondary | ICD-10-CM | POA: Diagnosis not present

## 2019-07-15 DIAGNOSIS — E785 Hyperlipidemia, unspecified: Secondary | ICD-10-CM | POA: Diagnosis not present

## 2019-07-15 DIAGNOSIS — Z8551 Personal history of malignant neoplasm of bladder: Secondary | ICD-10-CM | POA: Diagnosis not present

## 2019-07-15 DIAGNOSIS — Z466 Encounter for fitting and adjustment of urinary device: Secondary | ICD-10-CM | POA: Diagnosis not present

## 2019-07-15 DIAGNOSIS — M329 Systemic lupus erythematosus, unspecified: Secondary | ICD-10-CM | POA: Diagnosis not present

## 2019-07-15 DIAGNOSIS — I10 Essential (primary) hypertension: Secondary | ICD-10-CM | POA: Diagnosis not present

## 2019-07-15 DIAGNOSIS — J449 Chronic obstructive pulmonary disease, unspecified: Secondary | ICD-10-CM | POA: Diagnosis not present

## 2019-07-16 DIAGNOSIS — Z8551 Personal history of malignant neoplasm of bladder: Secondary | ICD-10-CM | POA: Diagnosis not present

## 2019-07-16 DIAGNOSIS — M329 Systemic lupus erythematosus, unspecified: Secondary | ICD-10-CM | POA: Diagnosis not present

## 2019-07-16 DIAGNOSIS — Z483 Aftercare following surgery for neoplasm: Secondary | ICD-10-CM | POA: Diagnosis not present

## 2019-07-16 DIAGNOSIS — Z466 Encounter for fitting and adjustment of urinary device: Secondary | ICD-10-CM | POA: Diagnosis not present

## 2019-07-16 DIAGNOSIS — I10 Essential (primary) hypertension: Secondary | ICD-10-CM | POA: Diagnosis not present

## 2019-07-16 DIAGNOSIS — E785 Hyperlipidemia, unspecified: Secondary | ICD-10-CM | POA: Diagnosis not present

## 2019-07-16 DIAGNOSIS — J449 Chronic obstructive pulmonary disease, unspecified: Secondary | ICD-10-CM | POA: Diagnosis not present

## 2019-07-21 DIAGNOSIS — Z483 Aftercare following surgery for neoplasm: Secondary | ICD-10-CM | POA: Diagnosis not present

## 2019-07-21 DIAGNOSIS — Z466 Encounter for fitting and adjustment of urinary device: Secondary | ICD-10-CM | POA: Diagnosis not present

## 2019-07-21 DIAGNOSIS — E785 Hyperlipidemia, unspecified: Secondary | ICD-10-CM | POA: Diagnosis not present

## 2019-07-21 DIAGNOSIS — Z8551 Personal history of malignant neoplasm of bladder: Secondary | ICD-10-CM | POA: Diagnosis not present

## 2019-07-21 DIAGNOSIS — M329 Systemic lupus erythematosus, unspecified: Secondary | ICD-10-CM | POA: Diagnosis not present

## 2019-07-21 DIAGNOSIS — I10 Essential (primary) hypertension: Secondary | ICD-10-CM | POA: Diagnosis not present

## 2019-07-21 DIAGNOSIS — J449 Chronic obstructive pulmonary disease, unspecified: Secondary | ICD-10-CM | POA: Diagnosis not present

## 2019-07-23 DIAGNOSIS — E785 Hyperlipidemia, unspecified: Secondary | ICD-10-CM | POA: Diagnosis not present

## 2019-07-23 DIAGNOSIS — Z8551 Personal history of malignant neoplasm of bladder: Secondary | ICD-10-CM | POA: Diagnosis not present

## 2019-07-23 DIAGNOSIS — J449 Chronic obstructive pulmonary disease, unspecified: Secondary | ICD-10-CM | POA: Diagnosis not present

## 2019-07-23 DIAGNOSIS — Z466 Encounter for fitting and adjustment of urinary device: Secondary | ICD-10-CM | POA: Diagnosis not present

## 2019-07-23 DIAGNOSIS — I10 Essential (primary) hypertension: Secondary | ICD-10-CM | POA: Diagnosis not present

## 2019-07-23 DIAGNOSIS — M329 Systemic lupus erythematosus, unspecified: Secondary | ICD-10-CM | POA: Diagnosis not present

## 2019-07-23 DIAGNOSIS — Z483 Aftercare following surgery for neoplasm: Secondary | ICD-10-CM | POA: Diagnosis not present

## 2019-07-27 DIAGNOSIS — J449 Chronic obstructive pulmonary disease, unspecified: Secondary | ICD-10-CM | POA: Diagnosis not present

## 2019-07-27 DIAGNOSIS — Z466 Encounter for fitting and adjustment of urinary device: Secondary | ICD-10-CM | POA: Diagnosis not present

## 2019-07-27 DIAGNOSIS — E785 Hyperlipidemia, unspecified: Secondary | ICD-10-CM | POA: Diagnosis not present

## 2019-07-27 DIAGNOSIS — I10 Essential (primary) hypertension: Secondary | ICD-10-CM | POA: Diagnosis not present

## 2019-07-27 DIAGNOSIS — Z8551 Personal history of malignant neoplasm of bladder: Secondary | ICD-10-CM | POA: Diagnosis not present

## 2019-07-27 DIAGNOSIS — M329 Systemic lupus erythematosus, unspecified: Secondary | ICD-10-CM | POA: Diagnosis not present

## 2019-07-27 DIAGNOSIS — Z483 Aftercare following surgery for neoplasm: Secondary | ICD-10-CM | POA: Diagnosis not present

## 2019-07-28 DIAGNOSIS — E785 Hyperlipidemia, unspecified: Secondary | ICD-10-CM | POA: Diagnosis not present

## 2019-07-28 DIAGNOSIS — M329 Systemic lupus erythematosus, unspecified: Secondary | ICD-10-CM | POA: Diagnosis not present

## 2019-07-28 DIAGNOSIS — J449 Chronic obstructive pulmonary disease, unspecified: Secondary | ICD-10-CM | POA: Diagnosis not present

## 2019-07-28 DIAGNOSIS — Z483 Aftercare following surgery for neoplasm: Secondary | ICD-10-CM | POA: Diagnosis not present

## 2019-07-28 DIAGNOSIS — I10 Essential (primary) hypertension: Secondary | ICD-10-CM | POA: Diagnosis not present

## 2019-07-28 DIAGNOSIS — Z8551 Personal history of malignant neoplasm of bladder: Secondary | ICD-10-CM | POA: Diagnosis not present

## 2019-07-28 DIAGNOSIS — Z466 Encounter for fitting and adjustment of urinary device: Secondary | ICD-10-CM | POA: Diagnosis not present

## 2019-07-29 DIAGNOSIS — E785 Hyperlipidemia, unspecified: Secondary | ICD-10-CM | POA: Diagnosis not present

## 2019-07-29 DIAGNOSIS — I10 Essential (primary) hypertension: Secondary | ICD-10-CM | POA: Diagnosis not present

## 2019-07-29 DIAGNOSIS — Z483 Aftercare following surgery for neoplasm: Secondary | ICD-10-CM | POA: Diagnosis not present

## 2019-07-29 DIAGNOSIS — Z466 Encounter for fitting and adjustment of urinary device: Secondary | ICD-10-CM | POA: Diagnosis not present

## 2019-07-29 DIAGNOSIS — J449 Chronic obstructive pulmonary disease, unspecified: Secondary | ICD-10-CM | POA: Diagnosis not present

## 2019-07-29 DIAGNOSIS — Z8551 Personal history of malignant neoplasm of bladder: Secondary | ICD-10-CM | POA: Diagnosis not present

## 2019-07-29 DIAGNOSIS — M329 Systemic lupus erythematosus, unspecified: Secondary | ICD-10-CM | POA: Diagnosis not present

## 2019-07-30 DIAGNOSIS — J449 Chronic obstructive pulmonary disease, unspecified: Secondary | ICD-10-CM | POA: Diagnosis not present

## 2019-07-30 DIAGNOSIS — Z466 Encounter for fitting and adjustment of urinary device: Secondary | ICD-10-CM | POA: Diagnosis not present

## 2019-07-30 DIAGNOSIS — I10 Essential (primary) hypertension: Secondary | ICD-10-CM | POA: Diagnosis not present

## 2019-07-30 DIAGNOSIS — E785 Hyperlipidemia, unspecified: Secondary | ICD-10-CM | POA: Diagnosis not present

## 2019-07-30 DIAGNOSIS — M329 Systemic lupus erythematosus, unspecified: Secondary | ICD-10-CM | POA: Diagnosis not present

## 2019-07-30 DIAGNOSIS — Z483 Aftercare following surgery for neoplasm: Secondary | ICD-10-CM | POA: Diagnosis not present

## 2019-07-30 DIAGNOSIS — Z8551 Personal history of malignant neoplasm of bladder: Secondary | ICD-10-CM | POA: Diagnosis not present

## 2019-08-02 DIAGNOSIS — J449 Chronic obstructive pulmonary disease, unspecified: Secondary | ICD-10-CM | POA: Diagnosis not present

## 2019-08-02 DIAGNOSIS — Z483 Aftercare following surgery for neoplasm: Secondary | ICD-10-CM | POA: Diagnosis not present

## 2019-08-02 DIAGNOSIS — I10 Essential (primary) hypertension: Secondary | ICD-10-CM | POA: Diagnosis not present

## 2019-08-02 DIAGNOSIS — E785 Hyperlipidemia, unspecified: Secondary | ICD-10-CM | POA: Diagnosis not present

## 2019-08-02 DIAGNOSIS — M329 Systemic lupus erythematosus, unspecified: Secondary | ICD-10-CM | POA: Diagnosis not present

## 2019-08-02 DIAGNOSIS — Z466 Encounter for fitting and adjustment of urinary device: Secondary | ICD-10-CM | POA: Diagnosis not present

## 2019-08-02 DIAGNOSIS — Z8551 Personal history of malignant neoplasm of bladder: Secondary | ICD-10-CM | POA: Diagnosis not present

## 2019-08-03 DIAGNOSIS — M329 Systemic lupus erythematosus, unspecified: Secondary | ICD-10-CM | POA: Diagnosis not present

## 2019-08-03 DIAGNOSIS — J449 Chronic obstructive pulmonary disease, unspecified: Secondary | ICD-10-CM | POA: Diagnosis not present

## 2019-08-03 DIAGNOSIS — I10 Essential (primary) hypertension: Secondary | ICD-10-CM | POA: Diagnosis not present

## 2019-08-03 DIAGNOSIS — Z466 Encounter for fitting and adjustment of urinary device: Secondary | ICD-10-CM | POA: Diagnosis not present

## 2019-08-03 DIAGNOSIS — E785 Hyperlipidemia, unspecified: Secondary | ICD-10-CM | POA: Diagnosis not present

## 2019-08-03 DIAGNOSIS — Z8551 Personal history of malignant neoplasm of bladder: Secondary | ICD-10-CM | POA: Diagnosis not present

## 2019-08-03 DIAGNOSIS — Z483 Aftercare following surgery for neoplasm: Secondary | ICD-10-CM | POA: Diagnosis not present

## 2019-08-04 DIAGNOSIS — Z8551 Personal history of malignant neoplasm of bladder: Secondary | ICD-10-CM | POA: Diagnosis not present

## 2019-08-04 DIAGNOSIS — J449 Chronic obstructive pulmonary disease, unspecified: Secondary | ICD-10-CM | POA: Diagnosis not present

## 2019-08-04 DIAGNOSIS — Z466 Encounter for fitting and adjustment of urinary device: Secondary | ICD-10-CM | POA: Diagnosis not present

## 2019-08-04 DIAGNOSIS — I10 Essential (primary) hypertension: Secondary | ICD-10-CM | POA: Diagnosis not present

## 2019-08-04 DIAGNOSIS — E785 Hyperlipidemia, unspecified: Secondary | ICD-10-CM | POA: Diagnosis not present

## 2019-08-04 DIAGNOSIS — Z483 Aftercare following surgery for neoplasm: Secondary | ICD-10-CM | POA: Diagnosis not present

## 2019-08-04 DIAGNOSIS — M329 Systemic lupus erythematosus, unspecified: Secondary | ICD-10-CM | POA: Diagnosis not present

## 2019-08-05 ENCOUNTER — Telehealth: Payer: Self-pay | Admitting: Family Medicine

## 2019-08-05 DIAGNOSIS — Z466 Encounter for fitting and adjustment of urinary device: Secondary | ICD-10-CM | POA: Diagnosis not present

## 2019-08-05 DIAGNOSIS — M329 Systemic lupus erythematosus, unspecified: Secondary | ICD-10-CM | POA: Diagnosis not present

## 2019-08-05 DIAGNOSIS — Z8551 Personal history of malignant neoplasm of bladder: Secondary | ICD-10-CM | POA: Diagnosis not present

## 2019-08-05 DIAGNOSIS — I10 Essential (primary) hypertension: Secondary | ICD-10-CM | POA: Diagnosis not present

## 2019-08-05 DIAGNOSIS — E785 Hyperlipidemia, unspecified: Secondary | ICD-10-CM | POA: Diagnosis not present

## 2019-08-05 DIAGNOSIS — J449 Chronic obstructive pulmonary disease, unspecified: Secondary | ICD-10-CM | POA: Diagnosis not present

## 2019-08-05 DIAGNOSIS — Z483 Aftercare following surgery for neoplasm: Secondary | ICD-10-CM | POA: Diagnosis not present

## 2019-08-05 NOTE — Chronic Care Management (AMB) (Signed)
°  Chronic Care Management   Outreach Note  08/05/2019 Name: Edwin Donovan. MRN: [id number] DOB: January 20, 1946  Referred by: Volney American, PA-C Reason for referral : Chronic Care Management (Initial CCM outreach wa unsuccessful. )   An unsuccessful telephone outreach was attempted today. The patient was referred to the case management team by for assistance with chronic care management and care coordination.   Follow Up Plan: The care management team will reach out to the patient again over the next 7 days.   [place]  [email or web address]   ??[phone]

## 2019-08-06 DIAGNOSIS — Z8551 Personal history of malignant neoplasm of bladder: Secondary | ICD-10-CM | POA: Diagnosis not present

## 2019-08-06 DIAGNOSIS — E785 Hyperlipidemia, unspecified: Secondary | ICD-10-CM | POA: Diagnosis not present

## 2019-08-06 DIAGNOSIS — M329 Systemic lupus erythematosus, unspecified: Secondary | ICD-10-CM | POA: Diagnosis not present

## 2019-08-06 DIAGNOSIS — Z466 Encounter for fitting and adjustment of urinary device: Secondary | ICD-10-CM | POA: Diagnosis not present

## 2019-08-06 DIAGNOSIS — I10 Essential (primary) hypertension: Secondary | ICD-10-CM | POA: Diagnosis not present

## 2019-08-06 DIAGNOSIS — Z483 Aftercare following surgery for neoplasm: Secondary | ICD-10-CM | POA: Diagnosis not present

## 2019-08-06 DIAGNOSIS — J449 Chronic obstructive pulmonary disease, unspecified: Secondary | ICD-10-CM | POA: Diagnosis not present

## 2019-08-10 DIAGNOSIS — J449 Chronic obstructive pulmonary disease, unspecified: Secondary | ICD-10-CM | POA: Diagnosis not present

## 2019-08-10 DIAGNOSIS — Z483 Aftercare following surgery for neoplasm: Secondary | ICD-10-CM | POA: Diagnosis not present

## 2019-08-10 DIAGNOSIS — I10 Essential (primary) hypertension: Secondary | ICD-10-CM | POA: Diagnosis not present

## 2019-08-10 DIAGNOSIS — Z8551 Personal history of malignant neoplasm of bladder: Secondary | ICD-10-CM | POA: Diagnosis not present

## 2019-08-10 DIAGNOSIS — E785 Hyperlipidemia, unspecified: Secondary | ICD-10-CM | POA: Diagnosis not present

## 2019-08-10 DIAGNOSIS — Z466 Encounter for fitting and adjustment of urinary device: Secondary | ICD-10-CM | POA: Diagnosis not present

## 2019-08-10 DIAGNOSIS — M329 Systemic lupus erythematosus, unspecified: Secondary | ICD-10-CM | POA: Diagnosis not present

## 2019-08-10 NOTE — Chronic Care Management (AMB) (Signed)
°  Chronic Care Management   Outreach Note  08/10/2019 Name: Edwin Donovan. MRN: [id number] DOB: [birth year]  Referred by: Volney American, PA-C Reason for referral : Chronic Care Management (Initial CCM outreach wa unsuccessful. ) and Chronic Care Management (Second CCM outreach was unsuccessful. )   A second unsuccessful telephone outreach was attempted today. The patient was referred to the case management team for assistance with chronic care management and care coordination.   Follow Up Plan: The care management team will reach out to the patient again over the next 7 days.  [place]  [email or web address]   ??[phone]

## 2019-08-11 DIAGNOSIS — Z466 Encounter for fitting and adjustment of urinary device: Secondary | ICD-10-CM | POA: Diagnosis not present

## 2019-08-11 DIAGNOSIS — Z8551 Personal history of malignant neoplasm of bladder: Secondary | ICD-10-CM | POA: Diagnosis not present

## 2019-08-11 DIAGNOSIS — E785 Hyperlipidemia, unspecified: Secondary | ICD-10-CM | POA: Diagnosis not present

## 2019-08-11 DIAGNOSIS — M329 Systemic lupus erythematosus, unspecified: Secondary | ICD-10-CM | POA: Diagnosis not present

## 2019-08-11 DIAGNOSIS — I10 Essential (primary) hypertension: Secondary | ICD-10-CM | POA: Diagnosis not present

## 2019-08-11 DIAGNOSIS — J449 Chronic obstructive pulmonary disease, unspecified: Secondary | ICD-10-CM | POA: Diagnosis not present

## 2019-08-11 DIAGNOSIS — Z483 Aftercare following surgery for neoplasm: Secondary | ICD-10-CM | POA: Diagnosis not present

## 2019-08-13 DIAGNOSIS — E785 Hyperlipidemia, unspecified: Secondary | ICD-10-CM | POA: Diagnosis not present

## 2019-08-13 DIAGNOSIS — I10 Essential (primary) hypertension: Secondary | ICD-10-CM | POA: Diagnosis not present

## 2019-08-13 DIAGNOSIS — J449 Chronic obstructive pulmonary disease, unspecified: Secondary | ICD-10-CM | POA: Diagnosis not present

## 2019-08-13 DIAGNOSIS — Z466 Encounter for fitting and adjustment of urinary device: Secondary | ICD-10-CM | POA: Diagnosis not present

## 2019-08-13 DIAGNOSIS — M329 Systemic lupus erythematosus, unspecified: Secondary | ICD-10-CM | POA: Diagnosis not present

## 2019-08-13 DIAGNOSIS — Z8551 Personal history of malignant neoplasm of bladder: Secondary | ICD-10-CM | POA: Diagnosis not present

## 2019-08-13 DIAGNOSIS — Z483 Aftercare following surgery for neoplasm: Secondary | ICD-10-CM | POA: Diagnosis not present

## 2019-08-16 DIAGNOSIS — I10 Essential (primary) hypertension: Secondary | ICD-10-CM | POA: Diagnosis not present

## 2019-08-16 DIAGNOSIS — E785 Hyperlipidemia, unspecified: Secondary | ICD-10-CM | POA: Diagnosis not present

## 2019-08-16 DIAGNOSIS — Z8551 Personal history of malignant neoplasm of bladder: Secondary | ICD-10-CM | POA: Diagnosis not present

## 2019-08-16 DIAGNOSIS — Z483 Aftercare following surgery for neoplasm: Secondary | ICD-10-CM | POA: Diagnosis not present

## 2019-08-16 DIAGNOSIS — J449 Chronic obstructive pulmonary disease, unspecified: Secondary | ICD-10-CM | POA: Diagnosis not present

## 2019-08-16 DIAGNOSIS — Z466 Encounter for fitting and adjustment of urinary device: Secondary | ICD-10-CM | POA: Diagnosis not present

## 2019-08-16 DIAGNOSIS — M329 Systemic lupus erythematosus, unspecified: Secondary | ICD-10-CM | POA: Diagnosis not present

## 2019-08-16 NOTE — Chronic Care Management (AMB) (Signed)
°  Chronic Care Management   Outreach Note  08/16/2019 Name: Edwin Donovan. MRN: [id number] DOB: 1945/12/31  Referred by: Volney American, PA-C Reason for referral : Chronic Care Management (Initial CCM outreach wa unsuccessful. ), Chronic Care Management (Second CCM outreach was unsuccessful. ), and Chronic Care Management (Third CCM outreach )   Third unsuccessful telephone outreach was attempted today. The patient was referred to the case management team for assistance with chronic care management and care coordination. The patient's primary care provider has been notified of our unsuccessful attempts to make or maintain contact with the patient. The care management team is pleased to engage with this patient at any time in the future should he/she be interested in assistance from the care management team.   Follow Up Plan: The care management team is available to follow up with the patient after provider conversation with the patient regarding recommendation for care management engagement and subsequent re-referral to the care management team.   [place]  [email or web address]   ??[phone]

## 2019-08-17 DIAGNOSIS — I10 Essential (primary) hypertension: Secondary | ICD-10-CM | POA: Diagnosis not present

## 2019-08-17 DIAGNOSIS — M329 Systemic lupus erythematosus, unspecified: Secondary | ICD-10-CM | POA: Diagnosis not present

## 2019-08-17 DIAGNOSIS — Z483 Aftercare following surgery for neoplasm: Secondary | ICD-10-CM | POA: Diagnosis not present

## 2019-08-17 DIAGNOSIS — J449 Chronic obstructive pulmonary disease, unspecified: Secondary | ICD-10-CM | POA: Diagnosis not present

## 2019-08-17 DIAGNOSIS — Z466 Encounter for fitting and adjustment of urinary device: Secondary | ICD-10-CM | POA: Diagnosis not present

## 2019-08-17 DIAGNOSIS — E785 Hyperlipidemia, unspecified: Secondary | ICD-10-CM | POA: Diagnosis not present

## 2019-08-17 DIAGNOSIS — Z8551 Personal history of malignant neoplasm of bladder: Secondary | ICD-10-CM | POA: Diagnosis not present

## 2019-08-18 ENCOUNTER — Other Ambulatory Visit: Payer: Self-pay

## 2019-08-18 DIAGNOSIS — Z20822 Contact with and (suspected) exposure to covid-19: Secondary | ICD-10-CM

## 2019-08-19 LAB — NOVEL CORONAVIRUS, NAA: SARS-CoV-2, NAA: NOT DETECTED

## 2019-08-20 DIAGNOSIS — Z8551 Personal history of malignant neoplasm of bladder: Secondary | ICD-10-CM | POA: Diagnosis not present

## 2019-08-20 DIAGNOSIS — E785 Hyperlipidemia, unspecified: Secondary | ICD-10-CM | POA: Diagnosis not present

## 2019-08-20 DIAGNOSIS — Z466 Encounter for fitting and adjustment of urinary device: Secondary | ICD-10-CM | POA: Diagnosis not present

## 2019-08-20 DIAGNOSIS — I10 Essential (primary) hypertension: Secondary | ICD-10-CM | POA: Diagnosis not present

## 2019-08-20 DIAGNOSIS — M329 Systemic lupus erythematosus, unspecified: Secondary | ICD-10-CM | POA: Diagnosis not present

## 2019-08-20 DIAGNOSIS — J449 Chronic obstructive pulmonary disease, unspecified: Secondary | ICD-10-CM | POA: Diagnosis not present

## 2019-08-20 DIAGNOSIS — Z483 Aftercare following surgery for neoplasm: Secondary | ICD-10-CM | POA: Diagnosis not present

## 2019-08-23 ENCOUNTER — Telehealth: Payer: Self-pay | Admitting: Family Medicine

## 2019-08-23 DIAGNOSIS — Z466 Encounter for fitting and adjustment of urinary device: Secondary | ICD-10-CM | POA: Diagnosis not present

## 2019-08-23 DIAGNOSIS — Z483 Aftercare following surgery for neoplasm: Secondary | ICD-10-CM | POA: Diagnosis not present

## 2019-08-23 DIAGNOSIS — M329 Systemic lupus erythematosus, unspecified: Secondary | ICD-10-CM | POA: Diagnosis not present

## 2019-08-23 DIAGNOSIS — E785 Hyperlipidemia, unspecified: Secondary | ICD-10-CM | POA: Diagnosis not present

## 2019-08-23 DIAGNOSIS — J449 Chronic obstructive pulmonary disease, unspecified: Secondary | ICD-10-CM | POA: Diagnosis not present

## 2019-08-23 DIAGNOSIS — Z8551 Personal history of malignant neoplasm of bladder: Secondary | ICD-10-CM | POA: Diagnosis not present

## 2019-08-23 DIAGNOSIS — I10 Essential (primary) hypertension: Secondary | ICD-10-CM | POA: Diagnosis not present

## 2019-08-23 NOTE — Telephone Encounter (Signed)
° °  Pt rec neg COVID results °

## 2019-08-24 DIAGNOSIS — M329 Systemic lupus erythematosus, unspecified: Secondary | ICD-10-CM | POA: Diagnosis not present

## 2019-08-24 DIAGNOSIS — Z8551 Personal history of malignant neoplasm of bladder: Secondary | ICD-10-CM | POA: Diagnosis not present

## 2019-08-24 DIAGNOSIS — E785 Hyperlipidemia, unspecified: Secondary | ICD-10-CM | POA: Diagnosis not present

## 2019-08-24 DIAGNOSIS — J449 Chronic obstructive pulmonary disease, unspecified: Secondary | ICD-10-CM | POA: Diagnosis not present

## 2019-08-24 DIAGNOSIS — Z483 Aftercare following surgery for neoplasm: Secondary | ICD-10-CM | POA: Diagnosis not present

## 2019-08-24 DIAGNOSIS — Z466 Encounter for fitting and adjustment of urinary device: Secondary | ICD-10-CM | POA: Diagnosis not present

## 2019-08-24 DIAGNOSIS — I10 Essential (primary) hypertension: Secondary | ICD-10-CM | POA: Diagnosis not present

## 2019-08-30 DIAGNOSIS — E785 Hyperlipidemia, unspecified: Secondary | ICD-10-CM | POA: Diagnosis not present

## 2019-08-30 DIAGNOSIS — Z8551 Personal history of malignant neoplasm of bladder: Secondary | ICD-10-CM | POA: Diagnosis not present

## 2019-08-30 DIAGNOSIS — Z466 Encounter for fitting and adjustment of urinary device: Secondary | ICD-10-CM | POA: Diagnosis not present

## 2019-08-30 DIAGNOSIS — I10 Essential (primary) hypertension: Secondary | ICD-10-CM | POA: Diagnosis not present

## 2019-08-30 DIAGNOSIS — M329 Systemic lupus erythematosus, unspecified: Secondary | ICD-10-CM | POA: Diagnosis not present

## 2019-08-30 DIAGNOSIS — J449 Chronic obstructive pulmonary disease, unspecified: Secondary | ICD-10-CM | POA: Diagnosis not present

## 2019-08-30 DIAGNOSIS — Z483 Aftercare following surgery for neoplasm: Secondary | ICD-10-CM | POA: Diagnosis not present

## 2019-08-31 DIAGNOSIS — M329 Systemic lupus erythematosus, unspecified: Secondary | ICD-10-CM | POA: Diagnosis not present

## 2019-08-31 DIAGNOSIS — E785 Hyperlipidemia, unspecified: Secondary | ICD-10-CM | POA: Diagnosis not present

## 2019-08-31 DIAGNOSIS — J449 Chronic obstructive pulmonary disease, unspecified: Secondary | ICD-10-CM | POA: Diagnosis not present

## 2019-08-31 DIAGNOSIS — Z466 Encounter for fitting and adjustment of urinary device: Secondary | ICD-10-CM | POA: Diagnosis not present

## 2019-08-31 DIAGNOSIS — Z8551 Personal history of malignant neoplasm of bladder: Secondary | ICD-10-CM | POA: Diagnosis not present

## 2019-08-31 DIAGNOSIS — I10 Essential (primary) hypertension: Secondary | ICD-10-CM | POA: Diagnosis not present

## 2019-08-31 DIAGNOSIS — Z483 Aftercare following surgery for neoplasm: Secondary | ICD-10-CM | POA: Diagnosis not present

## 2019-09-02 DIAGNOSIS — Z8551 Personal history of malignant neoplasm of bladder: Secondary | ICD-10-CM | POA: Diagnosis not present

## 2019-09-02 DIAGNOSIS — J449 Chronic obstructive pulmonary disease, unspecified: Secondary | ICD-10-CM | POA: Diagnosis not present

## 2019-09-02 DIAGNOSIS — I10 Essential (primary) hypertension: Secondary | ICD-10-CM | POA: Diagnosis not present

## 2019-09-02 DIAGNOSIS — E785 Hyperlipidemia, unspecified: Secondary | ICD-10-CM | POA: Diagnosis not present

## 2019-09-02 DIAGNOSIS — Z483 Aftercare following surgery for neoplasm: Secondary | ICD-10-CM | POA: Diagnosis not present

## 2019-09-02 DIAGNOSIS — Z466 Encounter for fitting and adjustment of urinary device: Secondary | ICD-10-CM | POA: Diagnosis not present

## 2019-09-02 DIAGNOSIS — M329 Systemic lupus erythematosus, unspecified: Secondary | ICD-10-CM | POA: Diagnosis not present

## 2019-09-03 ENCOUNTER — Other Ambulatory Visit: Payer: Self-pay

## 2019-09-03 DIAGNOSIS — Z20822 Contact with and (suspected) exposure to covid-19: Secondary | ICD-10-CM

## 2019-09-05 LAB — NOVEL CORONAVIRUS, NAA: SARS-CoV-2, NAA: NOT DETECTED

## 2019-09-08 DIAGNOSIS — J449 Chronic obstructive pulmonary disease, unspecified: Secondary | ICD-10-CM | POA: Diagnosis not present

## 2019-09-08 DIAGNOSIS — M329 Systemic lupus erythematosus, unspecified: Secondary | ICD-10-CM | POA: Diagnosis not present

## 2019-09-08 DIAGNOSIS — Z483 Aftercare following surgery for neoplasm: Secondary | ICD-10-CM | POA: Diagnosis not present

## 2019-09-08 DIAGNOSIS — E785 Hyperlipidemia, unspecified: Secondary | ICD-10-CM | POA: Diagnosis not present

## 2019-09-08 DIAGNOSIS — Z466 Encounter for fitting and adjustment of urinary device: Secondary | ICD-10-CM | POA: Diagnosis not present

## 2019-09-08 DIAGNOSIS — I10 Essential (primary) hypertension: Secondary | ICD-10-CM | POA: Diagnosis not present

## 2019-09-08 DIAGNOSIS — Z8551 Personal history of malignant neoplasm of bladder: Secondary | ICD-10-CM | POA: Diagnosis not present

## 2019-09-09 DIAGNOSIS — Z483 Aftercare following surgery for neoplasm: Secondary | ICD-10-CM | POA: Diagnosis not present

## 2019-09-09 DIAGNOSIS — J449 Chronic obstructive pulmonary disease, unspecified: Secondary | ICD-10-CM | POA: Diagnosis not present

## 2019-09-09 DIAGNOSIS — I10 Essential (primary) hypertension: Secondary | ICD-10-CM | POA: Diagnosis not present

## 2019-09-09 DIAGNOSIS — M329 Systemic lupus erythematosus, unspecified: Secondary | ICD-10-CM | POA: Diagnosis not present

## 2019-09-09 DIAGNOSIS — E785 Hyperlipidemia, unspecified: Secondary | ICD-10-CM | POA: Diagnosis not present

## 2019-09-09 DIAGNOSIS — Z466 Encounter for fitting and adjustment of urinary device: Secondary | ICD-10-CM | POA: Diagnosis not present

## 2019-09-09 DIAGNOSIS — Z8551 Personal history of malignant neoplasm of bladder: Secondary | ICD-10-CM | POA: Diagnosis not present

## 2019-09-12 IMAGING — CT NUCLEAR MEDICINE PET IMAGE INITIAL (PI) SKULL BASE TO THIGH
1 of 9 series · 2 of 16 positions shown, 3 images · non-contrast
Comparison: CT on 01/06/2019

CLINICAL DATA: Initial treatment strategy for right renal
high-grade urothelial carcinoma.

EXAM:
NUCLEAR MEDICINE PET SKULL BASE TO THIGH
TECHNIQUE: 15.9 mCi F-18 FDG was injected intravenously. Full-ring PET imaging
was performed from the skull base to thigh after the radiotracer. CT
data was obtained and used for attenuation correction and anatomic
localization.
Fasting blood glucose: 76 mg/dl

[Series 3: ct wb 5.0 b30f · axial · 0.98mm/px · z∈[-1174,-190]mm · 2 of 326 slices shown, 3 images]
[im 1/326  soft-tissue]
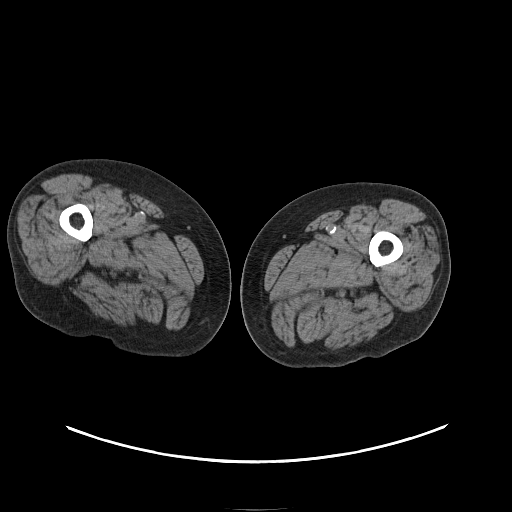
[im 1/326  bone]
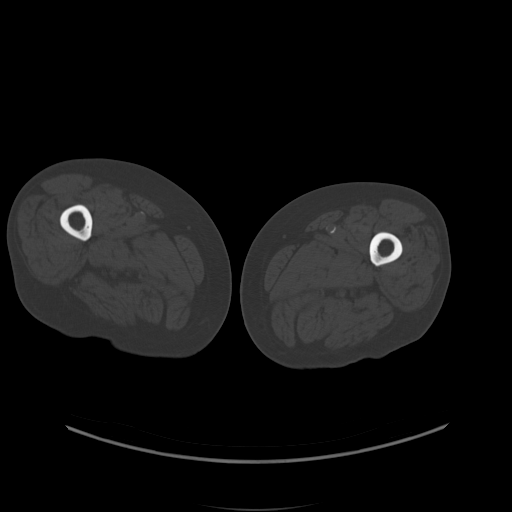
[im 326/326  soft-tissue]
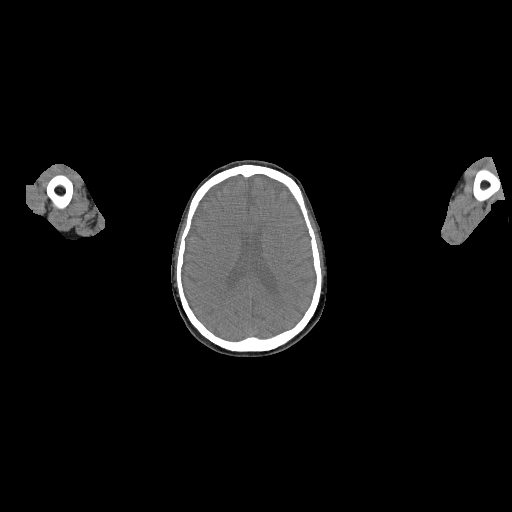

[2 of 16 positions shown; findings below may reference images not displayed]

FINDINGS: (Background mediastinal blood pool activity: SUV max = 2.7)

NECK: 6 mm left level II B lymph node is hypermetabolic, with SUV
max of 3.5. No other hypermetabolic lymph nodes or masses are seen
in the neck.

Incidental CT findings:  None.

CHEST: No hypermetabolic masses or lymphadenopathy. No suspicious
pulmonary nodules seen on CT images.

Incidental CT findings: Mild centrilobular emphysema. Aortic and
coronary artery atherosclerosis.

ABDOMEN/PELVIS: No abnormal hypermetabolic activity within the
liver, pancreas, adrenal glands, or spleen.

Right ureteral stent is seen in appropriate position. Hypermetabolic
activity is seen within the right intrarenal collecting system which
corresponds with site of known urothelial carcinoma. SUV max
measures 25.3. No hypermetabolic lymph nodes in the abdomen or
pelvis.

Incidental CT findings: Stable postop changes from previous
prostatectomy. Stable 3.4 cm abdominal aortic aneurysm.
Diverticulosis is seen mainly involving the sigmoid colon, however
there is no evidence of diverticulitis.

SKELETON: No focal hypermetabolic bone lesions to suggest skeletal
metastasis.

Incidental CT findings:  None.
IMPRESSION: Asymmetric hypermetabolic activity in the right intrarenal
collecting system, corresponding with known urothelial carcinoma.

No definite evidence of local or distant metastatic disease.

6 mm left cervical hypermetabolic lymph node is likely reactive in
etiology, and of doubtful clinical significance. Recommend follow-up
by neck CT in 6 months.

Stable 3.4 cm abdominal aortic aneurysm. Recommend followup by
ultrasound in 3 years. This recommendation follows ACR consensus
guidelines: White Paper of the ACR Incidental Findings Committee II

## 2019-09-15 DIAGNOSIS — E785 Hyperlipidemia, unspecified: Secondary | ICD-10-CM | POA: Diagnosis not present

## 2019-09-15 DIAGNOSIS — J449 Chronic obstructive pulmonary disease, unspecified: Secondary | ICD-10-CM | POA: Diagnosis not present

## 2019-09-15 DIAGNOSIS — M329 Systemic lupus erythematosus, unspecified: Secondary | ICD-10-CM | POA: Diagnosis not present

## 2019-09-15 DIAGNOSIS — Z8551 Personal history of malignant neoplasm of bladder: Secondary | ICD-10-CM | POA: Diagnosis not present

## 2019-09-15 DIAGNOSIS — Z483 Aftercare following surgery for neoplasm: Secondary | ICD-10-CM | POA: Diagnosis not present

## 2019-09-15 DIAGNOSIS — I10 Essential (primary) hypertension: Secondary | ICD-10-CM | POA: Diagnosis not present

## 2019-09-22 DIAGNOSIS — Z483 Aftercare following surgery for neoplasm: Secondary | ICD-10-CM | POA: Diagnosis not present

## 2019-09-22 DIAGNOSIS — I10 Essential (primary) hypertension: Secondary | ICD-10-CM | POA: Diagnosis not present

## 2019-09-22 DIAGNOSIS — J449 Chronic obstructive pulmonary disease, unspecified: Secondary | ICD-10-CM | POA: Diagnosis not present

## 2019-09-22 DIAGNOSIS — Z8551 Personal history of malignant neoplasm of bladder: Secondary | ICD-10-CM | POA: Diagnosis not present

## 2019-09-22 DIAGNOSIS — M329 Systemic lupus erythematosus, unspecified: Secondary | ICD-10-CM | POA: Diagnosis not present

## 2019-09-22 DIAGNOSIS — Z79891 Long term (current) use of opiate analgesic: Secondary | ICD-10-CM | POA: Diagnosis not present

## 2019-09-22 DIAGNOSIS — E785 Hyperlipidemia, unspecified: Secondary | ICD-10-CM | POA: Diagnosis not present

## 2019-09-29 DIAGNOSIS — Z79891 Long term (current) use of opiate analgesic: Secondary | ICD-10-CM | POA: Diagnosis not present

## 2019-09-29 DIAGNOSIS — J449 Chronic obstructive pulmonary disease, unspecified: Secondary | ICD-10-CM | POA: Diagnosis not present

## 2019-09-29 DIAGNOSIS — I10 Essential (primary) hypertension: Secondary | ICD-10-CM | POA: Diagnosis not present

## 2019-09-29 DIAGNOSIS — E785 Hyperlipidemia, unspecified: Secondary | ICD-10-CM | POA: Diagnosis not present

## 2019-09-29 DIAGNOSIS — M329 Systemic lupus erythematosus, unspecified: Secondary | ICD-10-CM | POA: Diagnosis not present

## 2019-09-29 DIAGNOSIS — Z8551 Personal history of malignant neoplasm of bladder: Secondary | ICD-10-CM | POA: Diagnosis not present

## 2019-09-29 DIAGNOSIS — Z483 Aftercare following surgery for neoplasm: Secondary | ICD-10-CM | POA: Diagnosis not present

## 2019-10-06 DIAGNOSIS — I10 Essential (primary) hypertension: Secondary | ICD-10-CM | POA: Diagnosis not present

## 2019-10-06 DIAGNOSIS — Z79891 Long term (current) use of opiate analgesic: Secondary | ICD-10-CM | POA: Diagnosis not present

## 2019-10-06 DIAGNOSIS — E785 Hyperlipidemia, unspecified: Secondary | ICD-10-CM | POA: Diagnosis not present

## 2019-10-06 DIAGNOSIS — J449 Chronic obstructive pulmonary disease, unspecified: Secondary | ICD-10-CM | POA: Diagnosis not present

## 2019-10-06 DIAGNOSIS — Z8551 Personal history of malignant neoplasm of bladder: Secondary | ICD-10-CM | POA: Diagnosis not present

## 2019-10-06 DIAGNOSIS — M329 Systemic lupus erythematosus, unspecified: Secondary | ICD-10-CM | POA: Diagnosis not present

## 2019-10-06 DIAGNOSIS — Z483 Aftercare following surgery for neoplasm: Secondary | ICD-10-CM | POA: Diagnosis not present

## 2019-10-15 DIAGNOSIS — Z79891 Long term (current) use of opiate analgesic: Secondary | ICD-10-CM | POA: Diagnosis not present

## 2019-10-15 DIAGNOSIS — M329 Systemic lupus erythematosus, unspecified: Secondary | ICD-10-CM | POA: Diagnosis not present

## 2019-10-15 DIAGNOSIS — I10 Essential (primary) hypertension: Secondary | ICD-10-CM | POA: Diagnosis not present

## 2019-10-15 DIAGNOSIS — E785 Hyperlipidemia, unspecified: Secondary | ICD-10-CM | POA: Diagnosis not present

## 2019-10-15 DIAGNOSIS — J449 Chronic obstructive pulmonary disease, unspecified: Secondary | ICD-10-CM | POA: Diagnosis not present

## 2019-10-15 DIAGNOSIS — Z483 Aftercare following surgery for neoplasm: Secondary | ICD-10-CM | POA: Diagnosis not present

## 2019-10-15 DIAGNOSIS — Z8551 Personal history of malignant neoplasm of bladder: Secondary | ICD-10-CM | POA: Diagnosis not present

## 2019-10-20 DIAGNOSIS — I10 Essential (primary) hypertension: Secondary | ICD-10-CM | POA: Diagnosis not present

## 2019-10-20 DIAGNOSIS — Z483 Aftercare following surgery for neoplasm: Secondary | ICD-10-CM | POA: Diagnosis not present

## 2019-10-20 DIAGNOSIS — J449 Chronic obstructive pulmonary disease, unspecified: Secondary | ICD-10-CM | POA: Diagnosis not present

## 2019-10-20 DIAGNOSIS — E785 Hyperlipidemia, unspecified: Secondary | ICD-10-CM | POA: Diagnosis not present

## 2019-10-20 DIAGNOSIS — M329 Systemic lupus erythematosus, unspecified: Secondary | ICD-10-CM | POA: Diagnosis not present

## 2019-10-20 DIAGNOSIS — Z79891 Long term (current) use of opiate analgesic: Secondary | ICD-10-CM | POA: Diagnosis not present

## 2019-10-20 DIAGNOSIS — Z8551 Personal history of malignant neoplasm of bladder: Secondary | ICD-10-CM | POA: Diagnosis not present

## 2019-10-27 DIAGNOSIS — M329 Systemic lupus erythematosus, unspecified: Secondary | ICD-10-CM | POA: Diagnosis not present

## 2019-10-27 DIAGNOSIS — Z79891 Long term (current) use of opiate analgesic: Secondary | ICD-10-CM | POA: Diagnosis not present

## 2019-10-27 DIAGNOSIS — J449 Chronic obstructive pulmonary disease, unspecified: Secondary | ICD-10-CM | POA: Diagnosis not present

## 2019-10-27 DIAGNOSIS — Z8551 Personal history of malignant neoplasm of bladder: Secondary | ICD-10-CM | POA: Diagnosis not present

## 2019-10-27 DIAGNOSIS — Z483 Aftercare following surgery for neoplasm: Secondary | ICD-10-CM | POA: Diagnosis not present

## 2019-10-27 DIAGNOSIS — I10 Essential (primary) hypertension: Secondary | ICD-10-CM | POA: Diagnosis not present

## 2019-10-27 DIAGNOSIS — E785 Hyperlipidemia, unspecified: Secondary | ICD-10-CM | POA: Diagnosis not present

## 2022-01-22 DIAGNOSIS — R062 Wheezing: Secondary | ICD-10-CM | POA: Diagnosis not present

## 2022-01-22 DIAGNOSIS — J80 Acute respiratory distress syndrome: Secondary | ICD-10-CM | POA: Diagnosis not present

## 2022-01-22 DIAGNOSIS — R0902 Hypoxemia: Secondary | ICD-10-CM | POA: Diagnosis not present

## 2022-02-26 ENCOUNTER — Inpatient Hospital Stay
Admission: EM | Admit: 2022-02-26 | Discharge: 2022-03-01 | DRG: 291 | Disposition: A | Discharge status: Home Health | Payer: Medicare Other | Attending: Family Medicine | Admitting: Family Medicine

## 2022-02-26 DIAGNOSIS — Z79891 Long term (current) use of opiate analgesic: Secondary | ICD-10-CM

## 2022-02-26 DIAGNOSIS — Z7951 Long term (current) use of inhaled steroids: Secondary | ICD-10-CM

## 2022-02-26 DIAGNOSIS — I130 Hypertensive heart and chronic kidney disease with heart failure and stage 1 through stage 4 chronic kidney disease, or unspecified chronic kidney disease: Principal | ICD-10-CM | POA: Diagnosis present

## 2022-02-26 DIAGNOSIS — G4700 Insomnia, unspecified: Secondary | ICD-10-CM | POA: Diagnosis present

## 2022-02-26 DIAGNOSIS — E785 Hyperlipidemia, unspecified: Secondary | ICD-10-CM | POA: Diagnosis not present

## 2022-02-26 DIAGNOSIS — J9601 Acute respiratory failure with hypoxia: Secondary | ICD-10-CM | POA: Diagnosis not present

## 2022-02-26 DIAGNOSIS — I248 Other forms of acute ischemic heart disease: Secondary | ICD-10-CM | POA: Diagnosis present

## 2022-02-26 DIAGNOSIS — Z960 Presence of urogenital implants: Secondary | ICD-10-CM | POA: Diagnosis present

## 2022-02-26 DIAGNOSIS — J9811 Atelectasis: Secondary | ICD-10-CM | POA: Diagnosis not present

## 2022-02-26 DIAGNOSIS — R0602 Shortness of breath: Principal | ICD-10-CM

## 2022-02-26 DIAGNOSIS — N182 Chronic kidney disease, stage 2 (mild): Secondary | ICD-10-CM | POA: Diagnosis present

## 2022-02-26 DIAGNOSIS — Z20822 Contact with and (suspected) exposure to covid-19: Secondary | ICD-10-CM | POA: Diagnosis present

## 2022-02-26 DIAGNOSIS — N179 Acute kidney failure, unspecified: Secondary | ICD-10-CM | POA: Diagnosis not present

## 2022-02-26 DIAGNOSIS — M1990 Unspecified osteoarthritis, unspecified site: Secondary | ICD-10-CM | POA: Diagnosis present

## 2022-02-26 DIAGNOSIS — D631 Anemia in chronic kidney disease: Secondary | ICD-10-CM | POA: Diagnosis present

## 2022-02-26 DIAGNOSIS — I5033 Acute on chronic diastolic (congestive) heart failure: Secondary | ICD-10-CM | POA: Diagnosis present

## 2022-02-26 DIAGNOSIS — Z9079 Acquired absence of other genital organ(s): Secondary | ICD-10-CM

## 2022-02-26 DIAGNOSIS — R0689 Other abnormalities of breathing: Secondary | ICD-10-CM | POA: Diagnosis not present

## 2022-02-26 DIAGNOSIS — I2699 Other pulmonary embolism without acute cor pulmonale: Secondary | ICD-10-CM | POA: Diagnosis not present

## 2022-02-26 DIAGNOSIS — Z79899 Other long term (current) drug therapy: Secondary | ICD-10-CM

## 2022-02-26 DIAGNOSIS — I5031 Acute diastolic (congestive) heart failure: Secondary | ICD-10-CM | POA: Diagnosis not present

## 2022-02-26 DIAGNOSIS — G894 Chronic pain syndrome: Secondary | ICD-10-CM | POA: Diagnosis present

## 2022-02-26 DIAGNOSIS — Z8249 Family history of ischemic heart disease and other diseases of the circulatory system: Secondary | ICD-10-CM | POA: Diagnosis not present

## 2022-02-26 DIAGNOSIS — J449 Chronic obstructive pulmonary disease, unspecified: Secondary | ICD-10-CM | POA: Diagnosis present

## 2022-02-26 DIAGNOSIS — Z713 Dietary counseling and surveillance: Secondary | ICD-10-CM

## 2022-02-26 DIAGNOSIS — Z8546 Personal history of malignant neoplasm of prostate: Secondary | ICD-10-CM

## 2022-02-26 DIAGNOSIS — I509 Heart failure, unspecified: Secondary | ICD-10-CM

## 2022-02-26 DIAGNOSIS — F419 Anxiety disorder, unspecified: Secondary | ICD-10-CM | POA: Diagnosis present

## 2022-02-26 DIAGNOSIS — Z87891 Personal history of nicotine dependence: Secondary | ICD-10-CM

## 2022-02-26 DIAGNOSIS — J9859 Other diseases of mediastinum, not elsewhere classified: Secondary | ICD-10-CM | POA: Diagnosis not present

## 2022-02-26 DIAGNOSIS — N400 Enlarged prostate without lower urinary tract symptoms: Secondary | ICD-10-CM | POA: Diagnosis present

## 2022-02-26 DIAGNOSIS — Z743 Need for continuous supervision: Secondary | ICD-10-CM | POA: Diagnosis not present

## 2022-02-26 DIAGNOSIS — R0902 Hypoxemia: Secondary | ICD-10-CM

## 2022-02-26 DIAGNOSIS — I110 Hypertensive heart disease with heart failure: Secondary | ICD-10-CM | POA: Diagnosis not present

## 2022-02-26 DIAGNOSIS — J90 Pleural effusion, not elsewhere classified: Secondary | ICD-10-CM | POA: Diagnosis not present

## 2022-02-26 DIAGNOSIS — F4310 Post-traumatic stress disorder, unspecified: Secondary | ICD-10-CM | POA: Diagnosis present

## 2022-02-26 DIAGNOSIS — Z6841 Body Mass Index (BMI) 40.0 and over, adult: Secondary | ICD-10-CM

## 2022-02-26 HISTORY — DX: Heart failure, unspecified: I50.9

## 2022-02-26 NOTE — ED Triage Notes (Signed)
Pt arrives from home via [facility] with complaints of SOB that started 3 days ago and has gotten worse today. Per EMS pt RA O2 sat was 86%. Pt not normally on oxygen. Pt has hx of CHF, COPD, HTN. AFIB ?

## 2022-02-26 NOTE — ED Provider Notes (Signed)
? ?[facility] ?Provider Note ? ? ? Event Date/Time  ? First MD Initiated Contact with Patient 02/26/22 2352   ?  (approximate) ? ? ?History  ? ?Shortness of breath ? ? ?HPI ? ?Edwin Donovan. is a 76 y.o. male brought to the ED via EMS from home with a chief complaint of shortness of breath.  Patient has a history of COPD, hypertension, lymphedema not on home oxygen.  Reports a 3-day history of progressive shortness of breath.  EMS reports room air saturation 86%.  Patient arrives to the ED on 4 L nasal cannula oxygen.  Denies fever, cough, chest pain, abdominal pain, nausea or vomiting. ?  ? ? ?Past Medical History  ? ?Past Medical History:  ?Diagnosis Date  ? Anxiety   ? ptsd  ? Arthritis   ? Cancer [facility])   ? PROSTATE  ? CHF (congestive heart failure) ([place])   ? Chronic kidney disease   ? stones  ? COPD (chronic obstructive pulmonary disease) ([place])   ? Dysrhythmia   ? History of kidney stones   ? Hypertension   ? Kidney stone   ? Pneumonia   ? 06/27/16  ? Shortness of breath dyspnea   ? ? ? ?Active Problem List  ? ?Patient Active Problem List  ? Diagnosis Date Noted  ? Urothelial carcinoma ([place]) 02/28/2019  ? History of prostate cancer 08/04/2018  ? Cervicalgia 12/25/2017  ? Spondylosis of cervical region without myelopathy or radiculopathy 12/25/2017  ? Primary osteoarthritis of both knees 12/25/2017  ? Lumbar degenerative disc disease 12/25/2017  ? Chronic pain syndrome 12/25/2017  ? COPD (chronic obstructive pulmonary disease) ([place]) 11/03/2017  ? Hypertension 11/03/2017  ? Insomnia 11/03/2017  ? Anxiety 11/03/2017  ? Sepsis ([place]) 06/28/2016  ? Right ureteral stone   ? ? ? ?Past Surgical History  ? ?Past Surgical History:  ?Procedure Laterality Date  ? CYSTOSCOPY W/ RETROGRADES Bilateral 12/26/2017  ? Procedure: CYSTOSCOPY WITH RETROGRADE PYELOGRAM;  Surgeon: Abbie Sons, MD;  Location: [facility] ORS;  Service: Urology;  Laterality: Bilateral;  ? CYSTOSCOPY W/ RETROGRADES Right  02/09/2019  ? Procedure: CYSTOSCOPY WITH RETROGRADE PYELOGRAM;  Surgeon: Abbie Sons, MD;  Location: [facility];  Service: Urology;  Laterality: Right;  ? CYSTOSCOPY W/ URETERAL STENT PLACEMENT Right 07/29/2016  ? Procedure: CYSTOSCOPY WITH STENT REPLACEMENT;  Surgeon: Hollice Espy, MD;  Location: [facility];  Service: Urology;  Laterality: Right;  ? CYSTOSCOPY WITH BIOPSY Right 07/29/2016  ? Procedure: CYSTOSCOPY WITH BIOPSY;  Surgeon: Hollice Espy, MD;  Location: [facility];  Service: Urology;  Laterality: Right;  ? CYSTOSCOPY WITH BIOPSY Right 02/09/2019  ? Procedure: CYSTOSCOPY WITH BIOPSY;  Surgeon: Abbie Sons, MD;  Location: [facility];  Service: Urology;  Laterality: Right;  ? CYSTOSCOPY WITH STENT PLACEMENT Right 06/28/2016  ? Procedure: CYSTOSCOPY WITH STENT PLACEMENT;  Surgeon: Hollice Espy, MD;  Location: [facility];  Service: Urology;  Laterality: Right;  ? CYSTOSCOPY WITH STENT PLACEMENT Right 02/09/2019  ? Procedure: CYSTOSCOPY WITH STENT PLACEMENT;  Surgeon: Abbie Sons, MD;  Location: [facility];  Service: Urology;  Laterality: Right;  ? CYSTOSCOPY/URETEROSCOPY/HOLMIUM LASER/STENT PLACEMENT Right 12/26/2017  ? Procedure: CYSTOSCOPY/URETEROSCOPY/HOLMIUM LASER/STENT PLACEMENT;  Surgeon: Abbie Sons, MD;  Location: [facility];  Service: Urology;  Laterality: Right;  ? EXTRACORPOREAL SHOCK WAVE LITHOTRIPSY Right 12/04/2017  ? Procedure: EXTRACORPOREAL SHOCK WAVE LITHOTRIPSY (ESWL);  Surgeon: Hollice Espy, MD;  Location: [facility];  Service: Urology;  Laterality: Right;  ? PROSTATECTOMY    ?  URETEROSCOPY Right 07/29/2016  ? Procedure: URETEROSCOPY;  Surgeon: Hollice Espy, MD;  Location: [facility];  Service: Urology;  Laterality: Right;  ? URETEROSCOPY Right 02/09/2019  ? Procedure: URETEROSCOPY- DIAGNOSTIC;  Surgeon: Abbie Sons, MD;  Location: [facility];  Service: Urology;  Laterality: Right;  ? ? ? ?Home Medications  ? ?Prior to Admission medications   ?Medication Sig Start Date End Date  Taking? Authorizing Provider  ?albuterol (PROVENTIL HFA;VENTOLIN HFA) 108 (90 Base) MCG/ACT inhaler Inhale 1-2 puffs into the lungs every 6 (six) hours as needed for wheezing or shortness of breath.    [provider]  ?amLODipine (NORVASC) 10 MG tablet Take 10 mg by mouth daily.    [provider]  ?budesonide-formoterol (SYMBICORT) 160-4.5 MCG/ACT inhaler Inhale 2 puffs into the lungs 2 (two) times daily.    [provider]  ?lisinopril (PRINIVIL,ZESTRIL) 40 MG tablet Take 40 mg by mouth daily.    [provider]  ?metoprolol (TOPROL-XL) 200 MG 24 hr tablet Take 200 mg by mouth daily.    [provider]  ?ondansetron (ZOFRAN) 8 MG tablet Take 1 tablet (8 mg total) by mouth 2 (two) times daily as needed. Start on the third day after cisplatin chemotherapy. 03/05/19   Lloyd Huger, MD  ?oxybutynin (DITROPAN) 5 MG tablet 1 tab tid prn frequency,urgency, bladder spasm 02/09/19   Stoioff, Ronda Fairly, MD  ?oxyCODONE-acetaminophen (PERCOCET) 5-325 MG tablet Take 1 tablet by mouth every 8 (eight) hours as needed. 02/09/19   Stoioff, Ronda Fairly, MD  ?prochlorperazine (COMPAZINE) 10 MG tablet Take 1 tablet (10 mg total) by mouth every 6 (six) hours as needed (Nausea or vomiting). 03/05/19   Lloyd Huger, MD  ? ? ? ?Allergies  ?Patient has no known allergies. ? ? ?Family History  ? ?Family History  ?Problem Relation Age of Onset  ? Stroke Mother   ? Heart attack Father   ? Lung cancer Father   ? Kidney disease Father   ? Prostate cancer Neg Hx   ? Bladder Cancer Neg Hx   ? ? ? ?Physical Exam  ?Triage Vital Signs: ?ED Triage Vitals  ?Enc Vitals Group  ?   BP   ?   Pulse   ?   Resp   ?   Temp   ?   Temp src   ?   SpO2   ?   Weight   ?   Height   ?   Head Circumference   ?   Peak Flow   ?   Pain Score   ?   Pain Loc   ?   Pain Edu?   ?   Excl. in [place]?   ? ? ?Updated Vital Signs: ?BP 128/80 (BP Location: Left Arm)   Pulse 82   Temp 97.7 ?F (36.5 ?C) (Oral)   Resp (!) 22   Ht 5'  11" (1.803 m)   Wt (!) 141.5 kg   SpO2 94%   BMI 43.52 kg/m?  ? ? ?General: Awake, mild distress.  ?CV:  RRR.  Good peripheral perfusion.  ?Resp:  Increased effort.  Bibasilar rales. ?Abd:  Nontender.  No distention.  ?Other:  BLE lymphedema.  RLE wrapped. ? ? ?ED Results / Procedures / Treatments  ?Labs ?(all labs ordered are listed, but only abnormal results are displayed) ?Labs Reviewed  ?CBC WITH DIFFERENTIAL/PLATELET - Abnormal; Notable for the following components:  ?    Result Value  ? RBC 3.93 (*)   ?  Hemoglobin 11.9 (*)   ? HCT 38.6 (*)   ? Platelets 138 (*)   ? All other components within normal limits  ?COMPREHENSIVE METABOLIC PANEL - Abnormal; Notable for the following components:  ? Glucose, Bld 115 (*)   ? BUN 32 (*)   ? Creatinine, Ser 1.61 (*)   ? GFR, Estimated 44 (*)   ? All other components within normal limits  ?TROPONIN I (HIGH SENSITIVITY) - Abnormal; Notable for the following components:  ? Troponin I (High Sensitivity) 41 (*)   ? All other components within normal limits  ?RESP PANEL BY RT-PCR (FLU A&B, COVID) [id number]  ?BRAIN NATRIURETIC PEPTIDE  ?CBC WITH DIFFERENTIAL/PLATELET  ?TROPONIN I (HIGH SENSITIVITY)  ? ? ? ?EKG ? ?ED ECG REPORT ?I, Paulette Blanch, the attending physician, personally viewed and interpreted this ECG. ? ? Date: 02/26/2022 ? EKG Time: 0005 ? Rate: 82 ? Rhythm: normal sinus rhythm ? Axis: Normal ? Intervals:none ? ST&T Change: Nonspecific ? ? ? ?RADIOLOGY ?I have independently visualized and interpreted patient's chest x-ray as well as noted the radiology interpretation: ? ?Chest x-ray: CHF, left pleural effusion ? ?Official radiology report(s): ?DG Chest Port 1 View ? ?Result Date: 02/27/2022 ?CLINICAL DATA:  Shortness of breath. EXAM: PORTABLE CHEST 1 VIEW COMPARISON:  None. FINDINGS: Moderate severity diffusely increased interstitial lung markings are seen. This is mildly increased in severity when compared to the prior study. Mild areas of atelectasis are seen within  the bilateral lung bases, left slightly greater than right. There is a small left pleural effusion. No pneumothorax is identified. The heart size and mediastinal contours are within normal limits. Multilevel degenerative

## 2022-02-27 ENCOUNTER — Other Ambulatory Visit: Payer: Self-pay

## 2022-02-27 ENCOUNTER — Emergency Department: Payer: Medicare Other

## 2022-02-27 ENCOUNTER — Inpatient Hospital Stay (HOSPITAL_COMMUNITY)
Admit: 2022-02-27 | Discharge: 2022-02-27 | Disposition: A | Discharge status: Home / Self Care | Payer: Medicare Other | Attending: Internal Medicine | Admitting: Internal Medicine

## 2022-02-27 DIAGNOSIS — D631 Anemia in chronic kidney disease: Secondary | ICD-10-CM | POA: Diagnosis present

## 2022-02-27 DIAGNOSIS — Z7951 Long term (current) use of inhaled steroids: Secondary | ICD-10-CM | POA: Diagnosis not present

## 2022-02-27 DIAGNOSIS — R531 Weakness: Secondary | ICD-10-CM | POA: Diagnosis not present

## 2022-02-27 DIAGNOSIS — Z7401 Bed confinement status: Secondary | ICD-10-CM | POA: Diagnosis not present

## 2022-02-27 DIAGNOSIS — N182 Chronic kidney disease, stage 2 (mild): Secondary | ICD-10-CM | POA: Diagnosis present

## 2022-02-27 DIAGNOSIS — I5031 Acute diastolic (congestive) heart failure: Secondary | ICD-10-CM | POA: Diagnosis not present

## 2022-02-27 DIAGNOSIS — E785 Hyperlipidemia, unspecified: Secondary | ICD-10-CM | POA: Diagnosis present

## 2022-02-27 DIAGNOSIS — M1990 Unspecified osteoarthritis, unspecified site: Secondary | ICD-10-CM | POA: Diagnosis present

## 2022-02-27 DIAGNOSIS — N179 Acute kidney failure, unspecified: Secondary | ICD-10-CM | POA: Diagnosis present

## 2022-02-27 DIAGNOSIS — F419 Anxiety disorder, unspecified: Secondary | ICD-10-CM | POA: Diagnosis present

## 2022-02-27 DIAGNOSIS — G4700 Insomnia, unspecified: Secondary | ICD-10-CM | POA: Diagnosis present

## 2022-02-27 DIAGNOSIS — I10 Essential (primary) hypertension: Secondary | ICD-10-CM | POA: Diagnosis not present

## 2022-02-27 DIAGNOSIS — Z6841 Body Mass Index (BMI) 40.0 and over, adult: Secondary | ICD-10-CM | POA: Diagnosis not present

## 2022-02-27 DIAGNOSIS — Z960 Presence of urogenital implants: Secondary | ICD-10-CM | POA: Diagnosis present

## 2022-02-27 DIAGNOSIS — I5033 Acute on chronic diastolic (congestive) heart failure: Secondary | ICD-10-CM | POA: Diagnosis present

## 2022-02-27 DIAGNOSIS — J9601 Acute respiratory failure with hypoxia: Secondary | ICD-10-CM | POA: Diagnosis present

## 2022-02-27 DIAGNOSIS — I509 Heart failure, unspecified: Secondary | ICD-10-CM | POA: Diagnosis not present

## 2022-02-27 DIAGNOSIS — Z79899 Other long term (current) drug therapy: Secondary | ICD-10-CM | POA: Diagnosis not present

## 2022-02-27 DIAGNOSIS — J9811 Atelectasis: Secondary | ICD-10-CM | POA: Diagnosis not present

## 2022-02-27 DIAGNOSIS — G894 Chronic pain syndrome: Secondary | ICD-10-CM | POA: Diagnosis present

## 2022-02-27 DIAGNOSIS — Z743 Need for continuous supervision: Secondary | ICD-10-CM | POA: Diagnosis not present

## 2022-02-27 DIAGNOSIS — F4310 Post-traumatic stress disorder, unspecified: Secondary | ICD-10-CM | POA: Diagnosis present

## 2022-02-27 DIAGNOSIS — Z9079 Acquired absence of other genital organ(s): Secondary | ICD-10-CM | POA: Diagnosis not present

## 2022-02-27 DIAGNOSIS — Z20822 Contact with and (suspected) exposure to covid-19: Secondary | ICD-10-CM | POA: Diagnosis present

## 2022-02-27 DIAGNOSIS — Z8249 Family history of ischemic heart disease and other diseases of the circulatory system: Secondary | ICD-10-CM | POA: Diagnosis not present

## 2022-02-27 DIAGNOSIS — I248 Other forms of acute ischemic heart disease: Secondary | ICD-10-CM | POA: Diagnosis present

## 2022-02-27 DIAGNOSIS — J90 Pleural effusion, not elsewhere classified: Secondary | ICD-10-CM | POA: Diagnosis not present

## 2022-02-27 DIAGNOSIS — I130 Hypertensive heart and chronic kidney disease with heart failure and stage 1 through stage 4 chronic kidney disease, or unspecified chronic kidney disease: Secondary | ICD-10-CM | POA: Diagnosis present

## 2022-02-27 DIAGNOSIS — J449 Chronic obstructive pulmonary disease, unspecified: Secondary | ICD-10-CM | POA: Diagnosis not present

## 2022-02-27 DIAGNOSIS — R5381 Other malaise: Secondary | ICD-10-CM | POA: Diagnosis not present

## 2022-02-27 DIAGNOSIS — Z8546 Personal history of malignant neoplasm of prostate: Secondary | ICD-10-CM | POA: Diagnosis not present

## 2022-02-27 DIAGNOSIS — N400 Enlarged prostate without lower urinary tract symptoms: Secondary | ICD-10-CM | POA: Diagnosis present

## 2022-02-27 DIAGNOSIS — C689 Malignant neoplasm of urinary organ, unspecified: Secondary | ICD-10-CM | POA: Diagnosis not present

## 2022-02-27 DIAGNOSIS — R0602 Shortness of breath: Secondary | ICD-10-CM | POA: Diagnosis not present

## 2022-02-27 LAB — RESP PANEL BY RT-PCR (FLU A&B, COVID) ARPGX2
Influenza A by PCR: NEGATIVE
Influenza B by PCR: NEGATIVE
SARS Coronavirus 2 by RT PCR: NEGATIVE

## 2022-02-27 LAB — COMPREHENSIVE METABOLIC PANEL
ALT: 11 U/L (ref 0–44)
AST: 17 U/L (ref 15–41)
Albumin: 3.5 g/dL (ref 3.5–5.0)
Alkaline Phosphatase: 62 U/L (ref 38–126)
Anion gap: 10 (ref 5–15)
BUN: 32 mg/dL — ABNORMAL HIGH (ref 8–23)
CO2: 26 mmol/L (ref 22–32)
Calcium: 9.1 mg/dL (ref 8.9–10.3)
Chloride: 101 mmol/L (ref 98–111)
Creatinine, Ser: 1.61 mg/dL — ABNORMAL HIGH (ref 0.61–1.24)
GFR, Estimated: 44 mL/min — ABNORMAL LOW (ref >60)
Glucose, Bld: 115 mg/dL — ABNORMAL HIGH (ref 70–99)
Potassium: 4.4 mmol/L (ref 3.5–5.1)
Sodium: 137 mmol/L (ref 135–145)
Total Bilirubin: 0.7 mg/dL (ref 0.3–1.2)
Total Protein: 8.1 g/dL (ref 6.5–8.1)

## 2022-02-27 LAB — CBC WITH DIFFERENTIAL/PLATELET
Abs Immature Granulocytes: 0.03 10*3/uL (ref 0.00–0.07)
Basophils Absolute: 0 10*3/uL (ref 0.0–0.1)
Basophils Relative: 0 %
Eosinophils Absolute: 0.2 10*3/uL (ref 0.0–0.5)
Eosinophils Relative: 3 %
HCT: 38.6 % — ABNORMAL LOW (ref 39.0–52.0)
Hemoglobin: 11.9 g/dL — ABNORMAL LOW (ref 13.0–17.0)
Immature Granulocytes: 0 %
Lymphocytes Relative: 10 %
Lymphs Abs: 0.8 10*3/uL (ref 0.7–4.0)
MCH: 30.3 pg (ref 26.0–34.0)
MCHC: 30.8 g/dL (ref 30.0–36.0)
MCV: 98.2 fL (ref 80.0–100.0)
Monocytes Absolute: 0.5 10*3/uL (ref 0.1–1.0)
Monocytes Relative: 7 %
Neutro Abs: 6.4 10*3/uL (ref 1.7–7.7)
Neutrophils Relative %: 80 %
Platelets: 138 10*3/uL — ABNORMAL LOW (ref 150–400)
RBC: 3.93 MIL/uL — ABNORMAL LOW (ref 4.22–5.81)
RDW: 15 % (ref 11.5–15.5)
WBC: 8 10*3/uL (ref 4.0–10.5)
nRBC: 0 % (ref 0.0–0.2)

## 2022-02-27 LAB — BRAIN NATRIURETIC PEPTIDE: B Natriuretic Peptide: 89.3 pg/mL (ref 0.0–100.0)

## 2022-02-27 LAB — TROPONIN I (HIGH SENSITIVITY)
Troponin I (High Sensitivity): 41 ng/L — ABNORMAL HIGH (ref <18)
Troponin I (High Sensitivity): 46 ng/L — ABNORMAL HIGH (ref <18)

## 2022-02-27 LAB — ECHOCARDIOGRAM COMPLETE
Height: 71 in
S' Lateral: 2.3 cm
Weight: 4992 oz

## 2022-02-27 MED ORDER — TRAZODONE HCL 50 MG PO TABS
50.0000 mg | ORAL_TABLET | Freq: Every evening | ORAL | Status: DC | PRN
  Administered 2022-02-28: 50 mg via ORAL
  Filled 2022-02-27: qty 1

## 2022-02-27 MED ORDER — FUROSEMIDE 10 MG/ML IJ SOLN
20.0000 mg | Freq: Once | INTRAMUSCULAR | Status: AC
  Administered 2022-02-27: 20 mg via INTRAVENOUS
  Filled 2022-02-27: qty 4

## 2022-02-27 MED ORDER — METOPROLOL SUCCINATE ER 100 MG PO TB24
100.0000 mg | ORAL_TABLET | Freq: Every day | ORAL | Status: DC
  Administered 2022-02-27 – 2022-03-01 (×3): 100 mg via ORAL
  Filled 2022-02-27: qty 1
  Filled 2022-02-27: qty 2
  Filled 2022-02-27: qty 1

## 2022-02-27 MED ORDER — ACETAMINOPHEN 325 MG PO TABS
650.0000 mg | ORAL_TABLET | Freq: Four times a day (QID) | ORAL | Status: DC | PRN
  Administered 2022-02-27 – 2022-03-01 (×2): 650 mg via ORAL
  Filled 2022-02-27 (×2): qty 2

## 2022-02-27 MED ORDER — FUROSEMIDE 10 MG/ML IJ SOLN
40.0000 mg | Freq: Two times a day (BID) | INTRAMUSCULAR | Status: DC
  Administered 2022-02-27 – 2022-03-01 (×6): 40 mg via INTRAVENOUS
  Filled 2022-02-27 (×6): qty 4

## 2022-02-27 MED ORDER — MOMETASONE FURO-FORMOTEROL FUM 200-5 MCG/ACT IN AERO
2.0000 | INHALATION_SPRAY | Freq: Two times a day (BID) | RESPIRATORY_TRACT | Status: DC
  Administered 2022-02-27 – 2022-03-01 (×4): 2 via RESPIRATORY_TRACT
  Filled 2022-02-27: qty 8.8

## 2022-02-27 MED ORDER — BENZONATATE 100 MG PO CAPS
200.0000 mg | ORAL_CAPSULE | Freq: Three times a day (TID) | ORAL | Status: DC
  Administered 2022-02-27 – 2022-03-01 (×8): 200 mg via ORAL
  Filled 2022-02-27 (×8): qty 2

## 2022-02-27 MED ORDER — IPRATROPIUM-ALBUTEROL 0.5-2.5 (3) MG/3ML IN SOLN
3.0000 mL | Freq: Once | RESPIRATORY_TRACT | Status: AC
  Administered 2022-02-27: 3 mL via RESPIRATORY_TRACT
  Filled 2022-02-27: qty 3

## 2022-02-27 MED ORDER — ALBUTEROL SULFATE (2.5 MG/3ML) 0.083% IN NEBU
2.5000 mL | INHALATION_SOLUTION | Freq: Four times a day (QID) | RESPIRATORY_TRACT | Status: DC | PRN
  Administered 2022-02-28: 3 mL via RESPIRATORY_TRACT
  Filled 2022-02-27: qty 3

## 2022-02-27 MED ORDER — LISINOPRIL 20 MG PO TABS
40.0000 mg | ORAL_TABLET | Freq: Every day | ORAL | Status: DC
  Administered 2022-02-27 – 2022-03-01 (×3): 40 mg via ORAL
  Filled 2022-02-27: qty 4
  Filled 2022-02-27 (×2): qty 2

## 2022-02-27 MED ORDER — POTASSIUM CHLORIDE CRYS ER 20 MEQ PO TBCR
20.0000 meq | EXTENDED_RELEASE_TABLET | Freq: Every day | ORAL | Status: DC
  Administered 2022-02-27 – 2022-03-01 (×3): 20 meq via ORAL
  Filled 2022-02-27 (×3): qty 1

## 2022-02-27 MED ORDER — IPRATROPIUM-ALBUTEROL 0.5-2.5 (3) MG/3ML IN SOLN
3.0000 mL | Freq: Four times a day (QID) | RESPIRATORY_TRACT | Status: DC
  Administered 2022-02-27 – 2022-03-01 (×11): 3 mL via RESPIRATORY_TRACT
  Filled 2022-02-27 (×11): qty 3

## 2022-02-27 MED ORDER — ENOXAPARIN SODIUM 80 MG/0.8ML IJ SOSY
0.5000 mg/kg | PREFILLED_SYRINGE | INTRAMUSCULAR | Status: DC
  Administered 2022-02-27 – 2022-02-28 (×2): 70 mg via SUBCUTANEOUS
  Filled 2022-02-27 (×2): qty 0.8

## 2022-02-27 MED ORDER — METHYLPREDNISOLONE SODIUM SUCC 125 MG IJ SOLR
125.0000 mg | Freq: Once | INTRAMUSCULAR | Status: AC
  Administered 2022-02-27: 125 mg via INTRAVENOUS
  Filled 2022-02-27: qty 2

## 2022-02-27 MED ORDER — ATORVASTATIN CALCIUM 80 MG PO TABS
80.0000 mg | ORAL_TABLET | Freq: Every day | ORAL | Status: DC
  Administered 2022-02-27 – 2022-03-01 (×3): 80 mg via ORAL
  Filled 2022-02-27: qty 1
  Filled 2022-02-27: qty 4
  Filled 2022-02-27: qty 1

## 2022-02-27 NOTE — H&P (Signed)
?History and Physical ? ?Edwin Donovan. [id number]:[id number] DOB: 09-22-1946 DOA: 02/26/2022 ? ?Referring physician: Dr. Beather Arbour, [place]  ?PCP: System, Provider Not In  ?Outpatient Specialists: [facility] ?Patient coming from: Home. ? ?Chief Complaint: Shortness of breath ? ?HPI: Edwin Donovan. is a 76 y.o. male with medical history significant for COPD, not on oxygen supplementation, former tobacco user, quit >1 year ago, BPH, essential hypertension, hyperlipidemia, CKD 2 who presented to [facility] ED from home with complaints of worsening shortness of breath of 2 days duration.  Associated with a non productive cough and worsening bilateral lower extremity edema.  Denies chest pain.  EMS was activated and on arrival, the patient was hypoxic with oxygen saturation of 86% on ambient air.  Upon presentation to the ED, the patient is volume overload on exam.  He has unna boot in right lower extremity.  Chest x-ray revealed pulmonary edema.  Patient was started on IV diuresing in the ED.  TRH, hospitalist team, was asked to admit. ? ?ED Course: Temperature 97.7.  BP 137/78.  Pulse 78.  Respiratory 18.  Oxygen saturation 96% on room air.  Labs today significant for BUN 32, creatinine 1.61 with GFR 44.  Troponin 41, 46.  Hemoglobin 11.9, platelet count 138.  COVID-19, influenza A and B negative. ? ?Review of Systems: ?Review of systems as noted in the HPI. All other systems reviewed and are negative. ? ? ?Past Medical History:  ?Diagnosis Date  ? Anxiety   ? ptsd  ? Arthritis   ? Cancer [facility])   ? PROSTATE  ? CHF (congestive heart failure) ([place])   ? Chronic kidney disease   ? stones  ? COPD (chronic obstructive pulmonary disease) ([place])   ? Dysrhythmia   ? History of kidney stones   ? Hypertension   ? Kidney stone   ? Pneumonia   ? 06/27/16  ? Shortness of breath dyspnea   ? ?Past Surgical History:  ?Procedure Laterality Date  ? CYSTOSCOPY W/ RETROGRADES Bilateral 12/26/2017  ? Procedure: CYSTOSCOPY WITH RETROGRADE PYELOGRAM;   Surgeon: Abbie Sons, MD;  Location: [facility];  Service: Urology;  Laterality: Bilateral;  ? CYSTOSCOPY W/ RETROGRADES Right 02/09/2019  ? Procedure: CYSTOSCOPY WITH RETROGRADE PYELOGRAM;  Surgeon: Abbie Sons, MD;  Location: [facility];  Service: Urology;  Laterality: Right;  ? CYSTOSCOPY W/ URETERAL STENT PLACEMENT Right 07/29/2016  ? Procedure: CYSTOSCOPY WITH STENT REPLACEMENT;  Surgeon: Hollice Espy, MD;  Location: [facility];  Service: Urology;  Laterality: Right;  ? CYSTOSCOPY WITH BIOPSY Right 07/29/2016  ? Procedure: CYSTOSCOPY WITH BIOPSY;  Surgeon: Hollice Espy, MD;  Location: [facility];  Service: Urology;  Laterality: Right;  ? CYSTOSCOPY WITH BIOPSY Right 02/09/2019  ? Procedure: CYSTOSCOPY WITH BIOPSY;  Surgeon: Abbie Sons, MD;  Location: [facility];  Service: Urology;  Laterality: Right;  ? CYSTOSCOPY WITH STENT PLACEMENT Right 06/28/2016  ? Procedure: CYSTOSCOPY WITH STENT PLACEMENT;  Surgeon: Hollice Espy, MD;  Location: [facility];  Service: Urology;  Laterality: Right;  ? CYSTOSCOPY WITH STENT PLACEMENT Right 02/09/2019  ? Procedure: CYSTOSCOPY WITH STENT PLACEMENT;  Surgeon: Abbie Sons, MD;  Location: [facility];  Service: Urology;  Laterality: Right;  ? CYSTOSCOPY/URETEROSCOPY/HOLMIUM LASER/STENT PLACEMENT Right 12/26/2017  ? Procedure: CYSTOSCOPY/URETEROSCOPY/HOLMIUM LASER/STENT PLACEMENT;  Surgeon: Abbie Sons, MD;  Location: [facility];  Service: Urology;  Laterality: Right;  ? EXTRACORPOREAL SHOCK WAVE LITHOTRIPSY Right 12/04/2017  ? Procedure: EXTRACORPOREAL SHOCK WAVE LITHOTRIPSY (ESWL);  Surgeon: Hollice Espy, MD;  Location: [facility];  Service: Urology;  Laterality: Right;  ? PROSTATECTOMY    ? URETEROSCOPY Right 07/29/2016  ? Procedure: URETEROSCOPY;  Surgeon: Hollice Espy, MD;  Location: [facility];  Service: Urology;  Laterality: Right;  ? URETEROSCOPY Right 02/09/2019  ? Procedure: URETEROSCOPY- DIAGNOSTIC;  Surgeon: Abbie Sons, MD;  Location: [facility];  Service:  Urology;  Laterality: Right;  ? ? ?Social History:  reports that he has been smoking cigarettes. He has been smoking an average of .5 packs per day. He has never used smokeless tobacco. He reports current alcohol use of about 14.0 standard drinks per week. He reports current drug use. Drug: Marijuana. ? ? ?No Known Allergies ? ?Family History  ?Problem Relation Age of Onset  ? Stroke Mother   ? Heart attack Father   ? Lung cancer Father   ? Kidney disease Father   ? Prostate cancer Neg Hx   ? Bladder Cancer Neg Hx   ?  ? ? ?Prior to Admission medications   ?Medication Sig Start Date End Date Taking? Authorizing Provider  ?atorvastatin (LIPITOR) 80 MG tablet TAKE ONE-HALF TABLET BY MOUTH AT BEDTIME FOR CHOLESTEROL 06/22/21  Yes [provider]  ?budesonide-formoterol (SYMBICORT) 160-4.5 MCG/ACT inhaler Inhale 2 puffs into the lungs 2 (two) times daily.   Yes [provider]  ?cetirizine (ZYRTEC) 10 MG tablet Take 1 tablet by mouth daily. 11/30/21  Yes [provider]  ?furosemide (LASIX) 40 MG tablet TAKE ONE TABLET BY MOUTH EVERY DAY FOR ACCUMULATION OF FLUID 01/03/22  Yes [provider]  ?lisinopril (PRINIVIL,ZESTRIL) 40 MG tablet Take 40 mg by mouth daily.   Yes [provider]  ?metoprolol succinate (TOPROL-XL) 100 MG 24 hr tablet TAKE ONE TABLET BY MOUTH EVERY DAY FOR HEART AND BLOOD PRESSURE 01/03/22  Yes [provider]  ?polyethylene glycol powder (GLYCOLAX/MIRALAX) 17 GM/SCOOP powder MIX 17GM (1 CAPFUL) BY MOUTH EVERY DAY AS NEEDED MIX 17 GRAMS (USE BOTTLE CAP) IN LIQUID OF CHOICE AS DIRECTED FOR CONSTIPATION 11/30/21  Yes [provider]  ?traZODone (DESYREL) 50 MG tablet Take 1 tablet by mouth at bedtime as needed. 08/15/21  Yes [provider]  ?albuterol (PROVENTIL HFA;VENTOLIN HFA) 108 (90 Base) MCG/ACT inhaler Inhale 1-2 puffs into the lungs every 6 (six) hours as needed for wheezing or shortness of breath.    [provider]   ?amLODipine (NORVASC) 10 MG tablet Take 10 mg by mouth daily. ?Patient not taking: Reported on 02/27/2022    [provider]  ?Cholecalciferol 25 MCG (1000 UT) tablet Take 2 tablets by mouth daily. ?Patient not taking: Reported on 02/27/2022 06/22/21   [provider]  ? ? ?Physical Exam: ?BP 128/80 (BP Location: Left Arm)   Pulse 82   Temp 97.7 ?F (36.5 ?C) (Oral)   Resp (!) 22   Ht 5\' 11"  (1.803 m)   Wt (!) 141.5 kg   SpO2 94%   BMI 43.52 kg/m?  ? ?General: 76 y.o. year-old male well developed well nourished in no acute distress.  Alert and oriented x3. ?Cardiovascular: Regular rate and rhythm with no rubs or gallops.  No thyromegaly or JVD noted.  2+ pitting edema in lower extremities bilaterally.  Right lower extremity in Unna boot.   ?Respiratory: Diffuse rales bilaterally.  Poor inspiratory effort. ?Abdomen: Soft nontender nondistended with normal bowel sounds x4 quadrants. ?Muskuloskeletal: No cyanosis or clubbing.  2+ edema noted bilaterally ?Neuro: CN II-XII intact, strength, sensation, reflexes ?Skin: No ulcerative lesions noted  or rashes ?Psychiatry: Judgement and insight appear normal. Mood is appropriate for condition and setting ?   ?   ?   ?Labs on Admission:  ?Basic Metabolic Panel: ?Recent Labs  ?Lab 02/27/22 ?0023  ?NA 137  ?K 4.4  ?CL 101  ?CO2 26  ?GLUCOSE 115*  ?BUN 32*  ?CREATININE 1.61*  ?CALCIUM 9.1  ? ?Liver Function Tests: ?Recent Labs  ?Lab 02/27/22 ?0023  ?AST 17  ?ALT 11  ?ALKPHOS 62  ?BILITOT 0.7  ?PROT 8.1  ?ALBUMIN 3.5  ? ?No results for input(s): LIPASE, AMYLASE in the last 168 hours. ?No results for input(s): AMMONIA in the last 168 hours. ?CBC: ?Recent Labs  ?Lab 02/27/22 ?0023  ?WBC 8.0  ?NEUTROABS 6.4  ?HGB 11.9*  ?HCT 38.6*  ?MCV 98.2  ?PLT 138*  ? ?Cardiac Enzymes: ?No results for input(s): CKTOTAL, CKMB, CKMBINDEX, TROPONINI in the last 168 hours. ? ?BNP (last 3 results) ?Recent Labs  ?  02/27/22 ?0023  ?BNP 89.3  ? ? ?ProBNP (last 3 results) ?No results  for input(s): PROBNP in the last 8760 hours. ? ?CBG: ?No results for input(s): GLUCAP in the last 168 hours. ? ?Radiological Exams on Admission: ?DG Chest Port 1 View ? ?Result Date: 02/27/2022 ?CLINICAL DATA:  [place]

## 2022-02-27 NOTE — Progress Notes (Signed)
?PROGRESS NOTE ? ? ? ?Edwin Donovan.  NTZ:[id number] DOB: April 22, 1946 DOA: 02/26/2022 ? ?PCP: System, Provider Not In  ? ? ?Brief Narrative:  ?This 76 years old male with PMH significant for COPD not on home oxygen, former tobacco user, quit smoking 1 year ago, BPH, essential hypertension, hyperlipidemia, CKD stage II presented in the ED from home with complaints of worsening shortness of breath for 2 days.  Patient also reported nonproductive cough associated with bilateral worsening lower extremity edema.  He denies any chest pain, patient was hypoxic with SPO2 86%  on room air after EMS arrived.  Appears volume overloaded on exam on arrival in the ED.  Patient has una boot in right lower extremity.  Chest x-ray revealed pulmonary edema.  Patient was started on IV diuresis. ?Patient is admitted for possible CHF exacerbation requiring IV diuresis. ? ?Assessment & Plan: ?  ?Principal Problem: ?  Acute CHF (congestive heart failure) ([place]) ? ?Acute on chronic diastolic CHF: ?Patient presented with 2-day history of gradually worsening shortness of breath with minimal exertion. ?He appeared volume overloaded on exam. ?Chest x-ray shows finding consistent with pulmonary edema. ?He is started on IV diuresis in the ED, Continue IV Lasix 40 mg twice daily with potassium replacement. ?Monitor daily weight, intake output charting. ?Last 2D echo in 2017 revealed LVEF 50 to 55% ?Obtain repeat 2D echocardiogram. ? ? ?Intake/Output Summary (Last 24 hours) at 02/27/2022 1423 ?Last data filed at 02/27/2022 1153 ?Gross per 24 hour  ?Intake --  ?Output 1050 ml  ?Net -1050 ml  ?  ?  ?Acute hypoxic respiratory failure sec. to pulmonary edema: ?Chest x-ray showed increasing pulmonary vascularity suggestive of pulmonary edema. ?Patient does not use oxygen at baseline.  Now requiring 4 L to maintain saturation above 94%. ?Wean off oxygen is tolerated ? ?Elevated troponin: ?Suspect demand ischemia in the setting of acute on chronic  diastolic CHF ?Patient denies any anginal symptoms.  Trend troponin ?Follow-up 2D echocardiogram, continue telemetry ?  ?AKI on CKD stage II: ?Baseline serum creatinine 1.05.  Creatinine on admission 1.61 ?Avoid nephrotoxic medications, dehydration, hypotension ?Monitor urine output, monitor serum creatinine ? ?Anemia of chronic disease: ?H&H remains stable, no overt bleeding. ?  ?Essential hypertension: ?Continue home blood pressure medications ?Lisinopril and metoprolol ?  ?History of tobacco use: ?Quit smoking more than 1 year ago. ?  ?Morbid obesity: ?BMI 43 ?Recommend weight loss outpatient with regular physical activity and healthy dieting. ?  ?DVT prophylaxis: Lovenox ?Code Status: Full code. ?Family Communication: No family at bed side. ?Disposition Plan:  ?Admitted for CHF exacerbation/acute hypoxic respiratory failure, requiring supplemental oxygen and diuresis. ? ?Consultants:  ?None ? ?Procedures: Echocardiogram ?Antimicrobials: None ? ?Subjective: ?Patient was seen and examined at bedside.  Overnight events noted.  Patient reports feeling better. ?He wants to resume his home medications. ? ?Objective: ?Vitals:  ? 02/27/22 0818 02/27/22 1000 02/27/22 1100 02/27/22 1300  ?BP:  (!) 143/93 132/80 (!) 163/94  ?Pulse:  97 91 96  ?Resp:  (!) 23 20 20   ?Temp:      ?TempSrc:      ?SpO2: 90% 91% 91% 96%  ?Weight:      ?Height:      ? ? ?Intake/Output Summary (Last 24 hours) at 02/27/2022 1423 ?Last data filed at 02/27/2022 1153 ?Gross per 24 hour  ?Intake --  ?Output 1050 ml  ?Net -1050 ml  ? ?Filed Weights  ? 02/27/22 0002  ?Weight: (!) 141.5 kg  ? ? ?Examination: ? ?  General exam: Appears comfortable, not in any acute distress.  Deconditioned ?Respiratory system: CTA bilaterally, no wheezing, no crackles, normal respiratory effort. ?Cardiovascular system: S1 & S2 heard, regular rate and rhythm, no murmur, ?Gastrointestinal system: Abdomen is soft, non tender, non distended, BS+ ?Central nervous system: Alert and  oriented X 3. No focal neurological deficits. ?Extremities: Right leg covered in dressing. ?Skin: No rashes, lesions or ulcers ?Psychiatry: Judgement and insight appear normal. Mood & affect appropriate.  ? ? ? ?Data Reviewed: I have personally reviewed following labs and imaging studies ? ?CBC: ?Recent Labs  ?Lab 02/27/22 ?0023  ?WBC 8.0  ?NEUTROABS 6.4  ?HGB 11.9*  ?HCT 38.6*  ?MCV 98.2  ?PLT 138*  ? ?Basic Metabolic Panel: ?Recent Labs  ?Lab 02/27/22 ?0023  ?NA 137  ?K 4.4  ?CL 101  ?CO2 26  ?GLUCOSE 115*  ?BUN 32*  ?CREATININE 1.61*  ?CALCIUM 9.1  ? ?GFR: ?Estimated Creatinine Clearance: 55.3 mL/min (A) (by C-G formula based on SCr of 1.61 mg/dL (H)). ?Liver Function Tests: ?Recent Labs  ?Lab 02/27/22 ?0023  ?AST 17  ?ALT 11  ?ALKPHOS 62  ?BILITOT 0.7  ?PROT 8.1  ?ALBUMIN 3.5  ? ?No results for input(s): LIPASE, AMYLASE in the last 168 hours. ?No results for input(s): AMMONIA in the last 168 hours. ?Coagulation Profile: ?No results for input(s): INR, PROTIME in the last 168 hours. ?Cardiac Enzymes: ?No results for input(s): CKTOTAL, CKMB, CKMBINDEX, TROPONINI in the last 168 hours. ?BNP (last 3 results) ?No results for input(s): PROBNP in the last 8760 hours. ?HbA1C: ?No results for input(s): HGBA1C in the last 72 hours. ?CBG: ?No results for input(s): GLUCAP in the last 168 hours. ?Lipid Profile: ?No results for input(s): CHOL, HDL, LDLCALC, TRIG, CHOLHDL, LDLDIRECT in the last 72 hours. ?Thyroid Function Tests: ?No results for input(s): TSH, T4TOTAL, FREET4, T3FREE, THYROIDAB in the last 72 hours. ?Anemia Panel: ?No results for input(s): VITAMINB12, FOLATE, FERRITIN, TIBC, IRON, RETICCTPCT in the last 72 hours. ?Sepsis Labs: ?No results for input(s): PROCALCITON, LATICACIDVEN in the last 168 hours. ? ?Recent Results (from the past 240 hour(s))  ?Resp Panel by RT-PCR (Flu A&B, Covid) Nasopharyngeal Swab     Status: None  ? Collection Time: 02/27/22 12:23 AM  ? Specimen: Nasopharyngeal Swab; Nasopharyngeal(NP)  swabs in vial transport medium  ?Result Value Ref Range Status  ? SARS Coronavirus 2 by RT PCR NEGATIVE NEGATIVE Final  ?  Comment: (NOTE) ?SARS-CoV-2 target nucleic acids are NOT DETECTED. ? ?The SARS-CoV-2 RNA is generally detectable in upper respiratory ?specimens during the acute phase of infection. The lowest ?concentration of SARS-CoV-2 viral copies this assay can detect is ?138 copies/mL. A negative result does not preclude SARS-Cov-2 ?infection and should not be used as the sole basis for treatment or ?other patient management decisions. A negative result may occur with  ?improper specimen collection/handling, submission of specimen other ?than nasopharyngeal swab, presence of viral mutation(s) within the ?areas targeted by this assay, and inadequate number of viral ?copies(<138 copies/mL). A negative result must be combined with ?clinical observations, patient history, and epidemiological ?information. The expected result is Negative. ? ?Fact Sheet for Patients:  ?EntrepreneurPulse.com.au ? ?Fact Sheet for Healthcare Providers:  ?IncredibleEmployment.be ? ?This test is no t yet approved or cleared by the [place] FDA and  ?has been authorized for detection and/or diagnosis of SARS-CoV-2 by ?FDA under an Emergency Use Authorization (EUA). This EUA will remain  ?in effect (meaning this test can be used) for the duration of the ?  COVID-19 declaration under Section 564(b)(1) of the Act, 21 ?U.S.C.section 360bbb-3(b)(1), unless the authorization is terminated  ?or revoked sooner.  ? ? ?  ? Influenza A by PCR NEGATIVE NEGATIVE Final  ? Influenza B by PCR NEGATIVE NEGATIVE Final  ?  Comment: (NOTE) ?The Xpert Xpress SARS-CoV-2/FLU/RSV plus assay is intended as an aid ?in the diagnosis of influenza from Nasopharyngeal swab specimens and ?should not be used as a sole basis for treatment. Nasal washings and ?aspirates are unacceptable for Xpert Xpress  SARS-CoV-2/FLU/RSV ?testing. ? ?Fact Sheet for Patients: ?EntrepreneurPulse.com.au ? ?Fact Sheet for Healthcare Providers: ?IncredibleEmployment.be ? ?This test is not yet approved or cleared by the

## 2022-02-27 NOTE — Progress Notes (Signed)
Patient asking about home dose of lisinopril, metoprolol, and albuterol inhaler, MD made aware and stated he will order.  ?

## 2022-02-27 NOTE — Evaluation (Signed)
Physical Therapy Evaluation ?Patient Details ?Name: Edwin Donovan. ?MRN: [id number] ?DOB: Oct 10, 1946 ?Today's Date: 02/27/2022 ? ?History of Present Illness ? 76 y.o. male with medical history significant for COPD, not on oxygen supplementation, former tobacco user, quit >1 year ago, BPH, essential hypertension, hyperlipidemia, CKD 2 who presented to [facility] ED from home with complaints of worsening shortness of breath of 2 days duration.  Associated with a non productive cough and worsening bilateral lower extremity edema.  Denies chest pain.  EMS was activated and on arrival, the patient was hypoxic with oxygen saturation of 86% on ambient air.  Upon presentation to the ED, the patient is volume overload on exam.  He has unna boot in right lower extremity.  Chest x-ray revealed pulmonary edema.  Patient was started on IV diuresing in the ED.  ?Clinical Impression ? Pt pleasant and eager to work with PT, had refused earlier as he had just worked with OT and was fatigued.  He ultimately was able to do most mobility w/o direct assist and did not seem too far from his non-ambulatory/scooter-lift chair baseline but was on 6L O2 t/o the session with some SOB that was greatly increased with increased activity.  Pt consistently dropped from high 90s at rest to low 90s with relatively modest mobility/movement (< 1 minute of activity at time and often less).  He managed to do some side stepping along EOB with labored effort, no overt safety issues. ?   ? ?Recommendations for follow up therapy are one component of a multi-disciplinary discharge planning process, led by the attending physician.  Recommendations may be updated based on patient status, additional functional criteria and insurance authorization. ? ?Follow Up Recommendations Home health PT ? ?  ?Assistance Recommended at Discharge Intermittent Supervision/Assistance  ?Patient can return home with the following ? Assistance with cooking/housework;Assist for  transportation;Help with stairs or ramp for entrance;A little help with bathing/dressing/bathroom ? ?  ?Equipment Recommendations None recommended by PT  ?Recommendations for Other Services ?    ?  ?Functional Status Assessment Patient has had a recent decline in their functional status and demonstrates the ability to make significant improvements in function in a reasonable and predictable amount of time.  ? ?  ?Precautions / Restrictions Precautions ?Precautions: Fall ?Restrictions ?Weight Bearing Restrictions: No  ? ?  ? ?Mobility ? Bed Mobility ?Overal bed mobility: Needs Assistance ?Bed Mobility: Supine to Sit ?  ?  ?Supine to sit: Supervision ?  ?  ?General bed mobility comments: Pt showed good ability to get himself to sitting needing only minimal cuing and CGA ?  ? ?Transfers ?Overall transfer level: Needs assistance ?Equipment used: Rolling walker (2 wheels) ?Transfers: Sit to/from Stand ?Sit to Stand: Min guard ?  ?  ?  ?  ?  ?General transfer comment: multiple bouts of standing per pt tolerance. He did need to use heavy forward lean/momentum to attain standing but did not require phyiscal assist any of the bouts ?  ? ?Ambulation/Gait ?  ?  ?  ?  ?  ?  ?  ?General Gait Details: Pt does not ambulate at baseline and we did not today.  However he was able to take a few small side shuffling steps showing the ability to weight bear/weight shift and maintain balance for ~30 seconds at a time ? ?Stairs ?  ?  ?  ?  ?  ? ?Wheelchair Mobility ?  ? ?Modified Rankin (Stroke Patients Only) ?  ? ?  ? ?  Balance Overall balance assessment: Needs assistance ?Sitting-balance support: Feet supported ?Sitting balance-Leahy Scale: Good ?  ?  ?Standing balance support: Bilateral upper extremity supported ?Standing balance-Leahy Scale: Fair ?Standing balance comment: reliant on walker, unable to attain fully upright ?  ?  ?  ?  ?  ?  ?  ?  ?  ?  ?  ?   ? ? ? ?Pertinent Vitals/Pain Pain Assessment ?Pain Assessment: No/denies pain   ? ? ?Home Living Family/patient expects to be discharged to:: Private residence ?Living Arrangements: Alone ?Available Help at Discharge: Personal care attendant (7d/wk 2-6 hours depending on what he needs) ?Type of Home: House ?Home Access: Ramped entrance ?  ?  ?  ?Home Layout: Two level;Able to live on main level with bedroom/bathroom ?Home Equipment: Kasandra Knudsen - single point;Shower seat;Electric scooter ?   ?  ?Prior Function Prior Level of Function : Needs assist ?  ?  ?  ?  ?  ?  ?Mobility Comments: Pt does not ambulate. He sleeps in recliner chair and transfers only to [facility] or to power chair ?  ?  ? ? ?Hand Dominance  ?   ? ?  ?Extremity/Trunk Assessment  ? Upper Extremity Assessment ?Upper Extremity Assessment: Overall WFL for tasks assessed;Generalized weakness ?  ? ?Lower Extremity Assessment ?Lower Extremity Assessment: Overall WFL for tasks assessed;Generalized weakness ?  ? ?   ?Communication  ? Communication: No difficulties  ?Cognition Arousal/Alertness: Awake/alert ?Behavior During Therapy: [facility] for tasks assessed/performed ?Overall Cognitive Status: Within Functional Limits for tasks assessed ?  ?  ?  ?  ?  ?  ?  ?  ?  ?  ?  ?  ?  ?  ?  ?  ?  ?  ?  ? ?  ?General Comments General comments (skin integrity, edema, etc.): Pt pleasant and motivated t/o session.  Quick to fatigue with minimal exertion.  On 6L O2 t/o session (no O2 at baseline) with drops in SpO2 from high 90s to low 90s with relatively minimal activity ? ?  ?Exercises    ? ?Assessment/Plan  ?  ?PT Assessment Patient needs continued PT services  ?PT Problem List Decreased strength;Decreased activity tolerance;Decreased knowledge of use of DME;Decreased safety awareness;Cardiopulmonary status limiting activity ? ?   ?  ?PT Treatment Interventions DME instruction;Functional mobility training;Therapeutic activities;Balance training;Therapeutic exercise;Neuromuscular re-education;Patient/family education;Wheelchair mobility training   ? ?PT Goals  (Current goals can be found in the Care Plan section)  ?Acute Rehab PT Goals ?Patient Stated Goal: get breathing better ?PT Goal Formulation: With patient ?Time For Goal Achievement: 03/13/22 ?Potential to Achieve Goals: Good ? ?  ?Frequency Min 2X/week ?  ? ? ?Co-evaluation   ?  ?  ?  ?  ? ? ?  ?AM-PAC PT "6 Clicks" Mobility  ?Outcome Measure Help needed turning from your back to your side while in a flat bed without using bedrails?: None ?Help needed moving from lying on your back to sitting on the side of a flat bed without using bedrails?: None ?Help needed moving to and from a bed to a chair (including a wheelchair)?: A Little ?Help needed standing up from a chair using your arms (e.g., wheelchair or bedside chair)?: A Little ?Help needed to walk in hospital room?: Total ?Help needed climbing 3-5 steps with a railing? : Total ?6 Click Score: 16 ? ?  ?End of Session Equipment Utilized During Treatment: Gait belt ?Activity Tolerance: Patient tolerated treatment well;Patient limited by fatigue ?Patient left:  in bed;with call bell/phone within reach ?  ?PT Visit Diagnosis: Muscle weakness (generalized) (M62.81);Difficulty in walking, not elsewhere classified (R26.2) ?  ? ?Time: 0352-4818 ?PT Time Calculation (min) (ACUTE ONLY): 29 min ? ? ?Charges:   PT Evaluation ?$PT Eval Low Complexity: 1 Low ?PT Treatments ?$Therapeutic Activity: 8-22 mins ?  ?   ? ? ?Kreg Shropshire, DPT ?02/27/2022, 5:35 PM ? ?

## 2022-02-27 NOTE — Evaluation (Signed)
Occupational Therapy Evaluation ?Patient Details ?Name: Edwin Donovan. ?MRN: [id number] ?DOB: 21-Jun-1946 ?Today's Date: 02/27/2022 ? ? ?History of Present Illness 76 y.o. male with medical history significant for COPD, not on oxygen supplementation, former tobacco user, quit >1 year ago, BPH, essential hypertension, hyperlipidemia, CKD 2 who presented to [facility] ED from home with complaints of worsening shortness of breath of 2 days duration.  Associated with a non productive cough and worsening bilateral lower extremity edema.  Denies chest pain.  EMS was activated and on arrival, the patient was hypoxic with oxygen saturation of 86% on ambient air.  Upon presentation to the ED, the patient is volume overload on exam.  He has unna boot in right lower extremity.  Chest x-ray revealed pulmonary edema.  Patient was started on IV diuresing in the ED.  ? ?Clinical Impression ?  ?Patient presenting with decreased Ind in self care,balance, functional mobility/transfers, endurance, and safety awareness. Patient reports living at home independently. He has an aide that comes 7x/wk fir several hours to perform all IADL tasks and assist with ADLs as needed. Pt reports he sleeps in lift chair and performs all self care tasks from this position with assist from aide. Pt is able to stand pivot with AD for toileting needs for BM or uses urinal. He is likely close to functional baseline but is requiring 4Ls via [place] to remain at or above 89% during session. Pt performs bed mobility with min guard and stands in same manner with several side steps to the L before returning to bed.  Patient will benefit from acute OT to increase overall independence in the areas of ADLs, functional mobility, and safety awareness in order to safely discharge home with assist.  ?   ? ?Recommendations for follow up therapy are one component of a multi-disciplinary discharge planning process, led by the attending physician.  Recommendations may be  updated based on patient status, additional functional criteria and insurance authorization.  ? ?Follow Up Recommendations ? Home health OT  ?  ?Assistance Recommended at Discharge Intermittent Supervision/Assistance  ?Patient can return home with the following A little help with walking and/or transfers;A little help with bathing/dressing/bathroom;Help with stairs or ramp for entrance;Assist for transportation;Assistance with cooking/housework ? ?  ?Functional Status Assessment ? Patient has had a recent decline in their functional status and demonstrates the ability to make significant improvements in function in a reasonable and predictable amount of time.  ?Equipment Recommendations ? None recommended by OT  ?  ?   ?Precautions / Restrictions Precautions ?Precautions: Fall  ? ?  ? ?Mobility Bed Mobility ?Overal bed mobility: Needs Assistance ?Bed Mobility: Supine to Sit, Sit to Supine ?  ?  ?Supine to sit: Supervision ?Sit to supine: Min assist ?  ?  ?  ? ?Transfers ?Overall transfer level: Needs assistance ?Equipment used: Rolling walker (2 wheels) ?Transfers: Sit to/from Stand ?Sit to Stand: Min guard ?  ?  ?  ?  ?  ?General transfer comment: min guard to stand from ED stretcher with 2-3 side steps along edge of bed ?  ? ?  ?Balance Overall balance assessment: Needs assistance ?Sitting-balance support: Feet supported ?Sitting balance-Leahy Scale: Good ?  ?  ?Standing balance support: Bilateral upper extremity supported ?Standing balance-Leahy Scale: Fair ?  ?  ?  ?  ?  ?  ?  ?  ?  ?  ?  ?  ?   ? ?ADL either performed or assessed with clinical judgement  ? ?  ADL Overall ADL's : Needs assistance/impaired ?  ?  ?Grooming: Wash/dry hands;Wash/dry face;Sitting;Supervision/safety;Set up ?  ?  ?  ?  ?  ?  ?  ?  ?  ?  ?  ?  ?  ?  ?  ?  ?   ? ? ? ?Vision Patient Visual Report: No change from baseline ?   ?   ?   ?   ? ?Pertinent Vitals/Pain Pain Assessment ?Pain Assessment: No/denies pain  ? ? ? ?   ?Extremity/Trunk  Assessment Upper Extremity Assessment ?Upper Extremity Assessment: Generalized weakness;Overall [facility] for tasks assessed ?  ?Lower Extremity Assessment ?Lower Extremity Assessment: Generalized weakness;Overall [facility] for tasks assessed ?  ?  ?  ?Communication Communication ?Communication: No difficulties ?  ?Cognition Arousal/Alertness: Awake/alert ?  ?Overall Cognitive Status: Within Functional Limits for tasks assessed ?  ?  ?  ?  ?  ?  ?  ?  ?  ?  ?  ?  ?  ?  ?  ?  ?  ?  ?  ?   ?   ?   ? ? ?Home Living Family/patient expects to be discharged to:: Private residence ?Living Arrangements: Alone ?Available Help at Discharge: Personal care attendant ?Type of Home: House ?Home Access: Ramped entrance ?  ?  ?Home Layout: Two level;Able to live on main level with bedroom/bathroom ?  ?  ?  ?  ?  ?  ?  ?Home Equipment: Kasandra Knudsen - single point;Shower seat;Wheelchair - power ?  ?  ?  ? ?  ?Prior Functioning/Environment Prior Level of Function : Needs assist ?  ?  ?  ?  ?  ?  ?Mobility Comments: Pt does not ambulate. He sleeps in recliner chair and transfers only to [facility] or to power chair ?ADLs Comments: Pt has aide that assist 7x/wk and performs IADLs and assists pt with bathing and dressing from lift chair. ?  ? ?  ?  ?OT Problem List: Decreased strength;Decreased activity tolerance;Impaired balance (sitting and/or standing);Decreased knowledge of use of DME or AE;Cardiopulmonary status limiting activity;Decreased safety awareness ?  ?   ?OT Treatment/Interventions: Self-care/ADL training;Therapeutic exercise;Energy conservation;DME and/or AE instruction;Cognitive remediation/compensation;Therapeutic activities;Balance training;Patient/family education;Manual therapy  ?  ?OT Goals(Current goals can be found in the care plan section) Acute Rehab OT Goals ?Patient Stated Goal: to go home and feel better ?OT Goal Formulation: With patient ?Time For Goal Achievement: 03/13/22 ?Potential to Achieve Goals: Good ?ADL Goals ?Pt Will Perform  Grooming: standing;with supervision ?Pt Will Perform Lower Body Dressing: with min assist;sit to/from stand ?Pt Will Transfer to Toilet: bedside commode;with supervision ?Pt Will Perform Toileting - Clothing Manipulation and hygiene: with min guard assist  ?OT Frequency: Min 2X/week ?  ? ?   ?AM-PAC OT "6 Clicks" Daily Activity     ?Outcome Measure Help from another person eating meals?: None ?Help from another person taking care of personal grooming?: None ?Help from another person toileting, which includes using toliet, bedpan, or urinal?: A Little ?Help from another person bathing (including washing, rinsing, drying)?: A Little ?Help from another person to put on and taking off regular upper body clothing?: A Little ?Help from another person to put on and taking off regular lower body clothing?: A Little ?6 Click Score: 20 ?  ?End of Session Equipment Utilized During Treatment: Rolling walker (2 wheels) ?Nurse Communication: Mobility status ? ?Activity Tolerance: Patient tolerated treatment well ?Patient left: in bed;with call bell/phone within reach;with bed alarm set ? ?  OT Visit Diagnosis: Unsteadiness on feet (R26.81);Muscle weakness (generalized) (M62.81)  ?              ?Time: 7943-2761 ?OT Time Calculation (min): 27 min ?Charges:  OT General Charges ?$OT Visit: 1 Visit ?OT Evaluation ?$OT Eval Moderate Complexity: 1 Mod ?OT Treatments ?$Therapeutic Activity: 8-22 mins ? ?Darleen Crocker, MS, OTR/L , CBIS ?ascom [phone]  ?02/27/22, 1:53 PM  ?

## 2022-02-27 NOTE — Progress Notes (Signed)
*  PRELIMINARY RESULTS* ?Echocardiogram ?2D Echocardiogram has been performed. ? ?Keval Nam, Sonia Side ?02/27/2022, 1:14 PM ?

## 2022-02-27 NOTE — Progress Notes (Signed)
Cross Cover ?Tylenol requested from patient  for report of headache 4/10. Ordered every 6 hours as needed ?

## 2022-02-27 NOTE — ED Notes (Signed)
Informed RN bed assigned 

## 2022-02-28 DIAGNOSIS — I509 Heart failure, unspecified: Secondary | ICD-10-CM | POA: Diagnosis not present

## 2022-02-28 LAB — CBC
HCT: 36.8 % — ABNORMAL LOW (ref 39.0–52.0)
Hemoglobin: 11.7 g/dL — ABNORMAL LOW (ref 13.0–17.0)
MCH: 30.4 pg (ref 26.0–34.0)
MCHC: 31.8 g/dL (ref 30.0–36.0)
MCV: 95.6 fL (ref 80.0–100.0)
Platelets: 136 10*3/uL — ABNORMAL LOW (ref 150–400)
RBC: 3.85 MIL/uL — ABNORMAL LOW (ref 4.22–5.81)
RDW: 14.7 % (ref 11.5–15.5)
WBC: 10.4 10*3/uL (ref 4.0–10.5)
nRBC: 0 % (ref 0.0–0.2)

## 2022-02-28 LAB — COMPREHENSIVE METABOLIC PANEL
ALT: 11 U/L (ref 0–44)
AST: 17 U/L (ref 15–41)
Albumin: 3.4 g/dL — ABNORMAL LOW (ref 3.5–5.0)
Alkaline Phosphatase: 53 U/L (ref 38–126)
Anion gap: 8 (ref 5–15)
BUN: 45 mg/dL — ABNORMAL HIGH (ref 8–23)
CO2: 29 mmol/L (ref 22–32)
Calcium: 8.7 mg/dL — ABNORMAL LOW (ref 8.9–10.3)
Chloride: 100 mmol/L (ref 98–111)
Creatinine, Ser: 1.71 mg/dL — ABNORMAL HIGH (ref 0.61–1.24)
GFR, Estimated: 41 mL/min — ABNORMAL LOW (ref >60)
Glucose, Bld: 115 mg/dL — ABNORMAL HIGH (ref 70–99)
Potassium: 4 mmol/L (ref 3.5–5.1)
Sodium: 137 mmol/L (ref 135–145)
Total Bilirubin: 0.7 mg/dL (ref 0.3–1.2)
Total Protein: 7.2 g/dL (ref 6.5–8.1)

## 2022-02-28 LAB — MAGNESIUM: Magnesium: 1.9 mg/dL (ref 1.7–2.4)

## 2022-02-28 LAB — PHOSPHORUS: Phosphorus: 3.7 mg/dL (ref 2.5–4.6)

## 2022-02-28 NOTE — Progress Notes (Signed)
?PROGRESS NOTE ? ? ? ?Edwin Donovan.  IRJ:[id number] DOB: [birth year] DOA: 02/26/2022 ? ?PCP: System, Provider Not In  ? ? ?Brief Narrative:  ?This 76 years old male with PMH significant for COPD not on home oxygen, former tobacco user, quit smoking 1 year ago, BPH, essential hypertension, hyperlipidemia, CKD stage II presented in the ED from home with complaints of worsening shortness of breath for 2 days.  Patient also reported nonproductive cough associated with bilateral worsening lower extremity edema.  He denies any chest pain, patient was hypoxic with SPO2 86%  on room air after EMS arrived.  Appears volume overloaded on exam on arrival in the ED.  Patient has una boot in right lower extremity.  Chest x-ray revealed pulmonary edema.  Patient was started on IV diuresis. Patient is admitted for possible CHF exacerbation requiring IV diuresis. ? ?Assessment & Plan: ?  ?Principal Problem: ?  Acute CHF (congestive heart failure) ([place]) ? ?Acute on chronic diastolic CHF: ?Patient presented with 2-day history of gradually worsening shortness of breath with minimal exertion. ?He appeared volume overloaded on exam. ?Chest x-ray showed findings consistent with pulmonary edema. ?He is started on IV diuresis in the ED, Continue IV Lasix 40 mg twice daily with potassium replacement. ?Monitor daily weight, intake output charting. ?Last 2D echo in 2017 revealed LVEF 50 to 55% ?Repeat 2D echocardiogram shows EF 60 to 65%.  Improved from prior. ? ? ?Intake/Output Summary (Last 24 hours) at 02/28/2022 1320 ?Last data filed at 02/28/2022 1114 ?Gross per 24 hour  ?Intake 120 ml  ?Output 1350 ml  ?Net -1230 ml  ?  ?  ?Acute hypoxic respiratory failure sec. to pulmonary edema: ?Chest x-ray showed increasing pulmonary vascularity suggestive of pulmonary edema. ?Patient does not use oxygen at baseline.  Now requiring 4 L to maintain saturation above 94%. ?Wean off oxygen is tolerated, down to 3L/ min ? ?Elevated troponin: ?Suspect  demand ischemia in the setting of acute on chronic diastolic CHF ?Patient denies any anginal symptoms.  Trop trending down. ?2d Echo: No RWMA., continue telemetry ?  ?AKI on CKD stage II: ?Baseline serum creatinine 1.05.  Creatinine on admission 1.61 ?Avoid nephrotoxic medications, dehydration, hypotension ?Monitor urine output, monitor serum creatinine. ? ?Anemia of chronic disease: ?H&H remains stable, no overt bleeding. ?  ?Essential hypertension: ?Continue home blood pressure medications ?Lisinopril and metoprolol ?  ?History of tobacco use: ?Quit smoking more than 1 year ago. ?  ?Morbid obesity: ?BMI 43 ?Recommend weight loss outpatient with regular physical activity and healthy dieting. ?  ?DVT prophylaxis: Lovenox ?Code Status: Full code. ?Family Communication: No family at bed side. ?Disposition Plan:  ?Admitted for CHF exacerbation/acute hypoxic respiratory failure, requiring supplemental oxygen and diuresis. ? ?Anticipated discharge home 03/01/2022. ? ?Consultants:  ?None ? ?Procedures: Echocardiogram ?Antimicrobials: None ? ?Subjective: ?Patient was seen and examined at bedside.  Overnight events noted.   ?Patient reports feeling very much improved.  Able to breathe better,  able to sit comfortably, still requires 2 L of oxygen. ? ?Objective: ?Vitals:  ? 02/28/22 0433 02/28/22 0813 02/28/22 0949 02/28/22 1115  ?BP:  (!) 146/88  128/78  ?Pulse:  73  78  ?Resp:  19  18  ?Temp:  97.7 ?F (36.5 ?C)  97.8 ?F (36.6 ?C)  ?TempSrc:  Oral  Oral  ?SpO2:  97% 95% 95%  ?Weight: 134.5 kg     ?Height:      ? ? ?Intake/Output Summary (Last 24 hours) at 02/28/2022 1320 ?Last data  filed at 02/28/2022 1114 ?Gross per 24 hour  ?Intake 120 ml  ?Output 1350 ml  ?Net -1230 ml  ? ?Filed Weights  ? 02/27/22 0002 02/28/22 0433  ?Weight: (!) 141.5 kg 134.5 kg  ? ? ?Examination: ? ?General exam: Appears comfortable, not in any acute distress.  Deconditioned ?Respiratory system: CTA bilaterally, no wheezing, no crackles, normal  respiratory effort. ?Cardiovascular system: S1 & S2 heard, regular rate and rhythm, no murmur, ?Gastrointestinal system: Abdomen is soft, non tender, non distended, BS+ ?Central nervous system: Alert and oriented X 3. No focal neurological deficits. ?Extremities: Right leg covered in dressing. ?Skin: No rashes, lesions or ulcers ?Psychiatry: Judgement and insight appear normal. Mood & affect appropriate.  ? ? ? ?Data Reviewed: I have personally reviewed following labs and imaging studies ? ?CBC: ?Recent Labs  ?Lab 02/27/22 ?0023 02/28/22 ?7858  ?WBC 8.0 10.4  ?NEUTROABS 6.4  --   ?HGB 11.9* 11.7*  ?HCT 38.6* 36.8*  ?MCV 98.2 95.6  ?PLT 138* 136*  ? ?Basic Metabolic Panel: ?Recent Labs  ?Lab 02/27/22 ?0023 02/28/22 ?8502  ?NA 137 137  ?K 4.4 4.0  ?CL 101 100  ?CO2 26 29  ?GLUCOSE 115* 115*  ?BUN 32* 45*  ?CREATININE 1.61* 1.71*  ?CALCIUM 9.1 8.7*  ?MG  --  1.9  ?PHOS  --  3.7  ? ?GFR: ?Estimated Creatinine Clearance: 50.7 mL/min (A) (by C-G formula based on SCr of 1.71 mg/dL (H)). ?Liver Function Tests: ?Recent Labs  ?Lab 02/27/22 ?0023 02/28/22 ?7741  ?AST 17 17  ?ALT 11 11  ?ALKPHOS 62 53  ?BILITOT 0.7 0.7  ?PROT 8.1 7.2  ?ALBUMIN 3.5 3.4*  ? ?No results for input(s): LIPASE, AMYLASE in the last 168 hours. ?No results for input(s): AMMONIA in the last 168 hours. ?Coagulation Profile: ?No results for input(s): INR, PROTIME in the last 168 hours. ?Cardiac Enzymes: ?No results for input(s): CKTOTAL, CKMB, CKMBINDEX, TROPONINI in the last 168 hours. ?BNP (last 3 results) ?No results for input(s): PROBNP in the last 8760 hours. ?HbA1C: ?No results for input(s): HGBA1C in the last 72 hours. ?CBG: ?No results for input(s): GLUCAP in the last 168 hours. ?Lipid Profile: ?No results for input(s): CHOL, HDL, LDLCALC, TRIG, CHOLHDL, LDLDIRECT in the last 72 hours. ?Thyroid Function Tests: ?No results for input(s): TSH, T4TOTAL, FREET4, T3FREE, THYROIDAB in the last 72 hours. ?Anemia Panel: ?No results for input(s): VITAMINB12,  FOLATE, FERRITIN, TIBC, IRON, RETICCTPCT in the last 72 hours. ?Sepsis Labs: ?No results for input(s): PROCALCITON, LATICACIDVEN in the last 168 hours. ? ?Recent Results (from the past 240 hour(s))  ?Resp Panel by RT-PCR (Flu A&B, Covid) Nasopharyngeal Swab     Status: None  ? Collection Time: 02/27/22 12:23 AM  ? Specimen: Nasopharyngeal Swab; Nasopharyngeal(NP) swabs in vial transport medium  ?Result Value Ref Range Status  ? SARS Coronavirus 2 by RT PCR NEGATIVE NEGATIVE Final  ?  Comment: (NOTE) ?SARS-CoV-2 target nucleic acids are NOT DETECTED. ? ?The SARS-CoV-2 RNA is generally detectable in upper respiratory ?specimens during the acute phase of infection. The lowest ?concentration of SARS-CoV-2 viral copies this assay can detect is ?138 copies/mL. A negative result does not preclude SARS-Cov-2 ?infection and should not be used as the sole basis for treatment or ?other patient management decisions. A negative result may occur with  ?improper specimen collection/handling, submission of specimen other ?than nasopharyngeal swab, presence of viral mutation(s) within the ?areas targeted by this assay, and inadequate number of viral ?copies(<138 copies/mL). A negative result must  be combined with ?clinical observations, patient history, and epidemiological ?information. The expected result is Negative. ? ?Fact Sheet for Patients:  ?EntrepreneurPulse.com.au ? ?Fact Sheet for Healthcare Providers:  ?IncredibleEmployment.be ? ?This test is no t yet approved or cleared by the [place] FDA and  ?has been authorized for detection and/or diagnosis of SARS-CoV-2 by ?FDA under an Emergency Use Authorization (EUA). This EUA will remain  ?in effect (meaning this test can be used) for the duration of the ?COVID-19 declaration under Section 564(b)(1) of the Act, 21 ?U.S.C.section 360bbb-3(b)(1), unless the authorization is terminated  ?or revoked sooner.  ? ? ?  ? Influenza A by PCR  NEGATIVE NEGATIVE Final  ? Influenza B by PCR NEGATIVE NEGATIVE Final  ?  Comment: (NOTE) ?The Xpert Xpress SARS-CoV-2/FLU/RSV plus assay is intended as an aid ?in the diagnosis of influenza from Nasopharyngeal s

## 2022-02-28 NOTE — TOC Initial Note (Signed)
Transition of Care (TOC) - Initial/Assessment Note  ? ? ?Patient Details  ?Name: Edwin Donovan. ?MRN: [id number] ?Date of Birth: 10-Oct-1946 ? ?Transition of Care (TOC) CM/SW Contact:    ?Alberteen Sam, LCSW ?Phone Number: ?02/28/2022, 10:54 AM ? ?Clinical Narrative:                 ? ?Patient is active with [facility] for PT and RN through [place] services. Corene Cornea with [facility] informed of patient's admittance to hospital.  ? ?Patient follows up with [facility] in [facility] for his appointments.  ? ?TOC will continue to follow for additional discharge planning needs.  ? ?Expected Discharge Plan: [place] ?Barriers to Discharge: Continued Medical Work up ? ? ?Patient Goals and CMS Choice ?Patient states their goals for this hospitalization and ongoing recovery are:: to go home ?CMS Medicare.gov Compare Post Acute Care list provided to:: Patient ?Choice offered to / list presented to : Patient ? ?Expected Discharge Plan and Services ?Expected Discharge Plan: [place] ?  ?  ?  ?  ?                ?  ?  ?  ?  ?  ?HH Arranged: PT, RN ?[place] Agency: [place] ([place]) ?Date HH Agency Contacted: 02/28/22 ?Time [place]: 7035 ?Representative spoke with at [place]: Corene Cornea ? ?Prior Living Arrangements/Services ?  ?  ?  ?Do you feel safe going back to the place where you live?: Yes      ?  ?  ?  ?  ? ?Activities of Daily Living ?Home Assistive Devices/Equipment: Eyeglasses ?ADL Screening (condition at time of admission) ?Patient's cognitive ability adequate to safely complete daily activities?: Yes ?Is the patient deaf or have difficulty hearing?: No ?Does the patient have difficulty seeing, even when wearing glasses/contacts?: Yes ?Does the patient have difficulty concentrating, remembering, or making decisions?: No ?Patient able to express need for assistance with ADLs?: Yes ?Does the patient have difficulty dressing or bathing?: No ?Independently performs ADLs?:  Yes (appropriate for developmental age) ?Does the patient have difficulty walking or climbing stairs?: Yes ?Weakness of Legs: Both ?Weakness of Arms/Hands: None ? ?Permission Sought/Granted ?  ?  ?   ?   ?   ?   ? ?Emotional Assessment ?  ?  ?  ?  ?Alcohol / Substance Use: Not Applicable ?Psych Involvement: No (comment) ? ?Admission diagnosis:  SOB (shortness of breath) [R06.02] ?Hypoxia [R09.02] ?Acute CHF (congestive heart failure) (HCC) [I50.9] ?Acute on chronic congestive heart failure, unspecified heart failure type ([place]) [I50.9] ?Patient Active Problem List  ? Diagnosis Date Noted  ? Acute CHF (congestive heart failure) ([place]) 02/27/2022  ? Urothelial carcinoma ([place]) 02/28/2019  ? History of prostate cancer 08/04/2018  ? Cervicalgia 12/25/2017  ? Spondylosis of cervical region without myelopathy or radiculopathy 12/25/2017  ? Primary osteoarthritis of both knees 12/25/2017  ? Lumbar degenerative disc disease 12/25/2017  ? Chronic pain syndrome 12/25/2017  ? COPD (chronic obstructive pulmonary disease) ([place]) 11/03/2017  ? Hypertension 11/03/2017  ? Insomnia 11/03/2017  ? Anxiety 11/03/2017  ? Sepsis ([place]) 06/28/2016  ? Right ureteral stone   ? ?PCP:  System, Provider Not In ?Pharmacy:   ?[place] [place] ?[place] ?[place] [address] ?Phone: [phone] Fax: [phone] ? ? ? ? ?Social Determinants of Health (SDOH) Interventions ?  ? ?Readmission Risk Interventions ?   ?  View : No data to display.  ?  ?  ?  ? ? ? ?

## 2022-02-28 NOTE — Care Management Important Message (Signed)
Important Message ? ?Patient Details  ?Name: Edwin Donovan. ?MRN: [id number] ?Date of Birth: Apr 14, 1946 ? ? ?Medicare Important Message Given:  N/A - LOS <3 / Initial given by admissions ? ? ? ? ?[name] ?02/28/2022, 6:22 PM ?

## 2022-02-28 NOTE — Progress Notes (Signed)
Physical Therapy Treatment ?Patient Details ?Name: Edwin Donovan. ?MRN: [id number] ?DOB: 01/06/1946 ?Today's Date: 02/28/2022 ? ? ?History of Present Illness 76 y.o. male with medical history significant for COPD, not on oxygen supplementation, former tobacco user, quit >1 year ago, BPH, essential hypertension, hyperlipidemia, CKD 2 who presented to [facility] ED from home with complaints of worsening shortness of breath of 2 days duration.  Associated with a non productive cough and worsening bilateral lower extremity edema.  Denies chest pain.  EMS was activated and on arrival, the patient was hypoxic with oxygen saturation of 86% on ambient air.  Upon presentation to the ED, the patient is volume overload on exam.  He has unna boot in right lower extremity.  Chest x-ray revealed pulmonary edema.  Patient was started on IV diuresing in the ED. ? ?  ?PT Comments  ? ? Pt limited with how much he could do but motivated to do some activity and pleasantly interactive t/o the session.  Pt able to get to and tolerate sitting quite well, however his transitions to standing and maintaining standing were quite limited.  He showed the ability to do some brief standing but needed elevated surface (relies on lift chair at home) and did not tolerate upright more than 30 seconds on any standing attempt.  Pt on O2 t/o the session staying in the mid 90s at rest and low 90s/high 80s with modest activity (brief attempt at room air caused quick drop to 80s.) Pt with questions about PT, recovery, diagnosis and general wellness - all questions answered as appropriate.  ?Recommendations for follow up therapy are one component of a multi-disciplinary discharge planning process, led by the attending physician.  Recommendations may be updated based on patient status, additional functional criteria and insurance authorization. ? ?Follow Up Recommendations ? Home health PT ?  ?  ?Assistance Recommended at Discharge Intermittent  Supervision/Assistance  ?Patient can return home with the following Assistance with cooking/housework;Assist for transportation;Help with stairs or ramp for entrance;A little help with bathing/dressing/bathroom ?  ?Equipment Recommendations ? None recommended by PT  ?  ?Recommendations for Other Services   ? ? ?  ?Precautions / Restrictions Precautions ?Precautions: Fall ?Restrictions ?Weight Bearing Restrictions: No  ?  ? ?Mobility ? Bed Mobility ?Overal bed mobility: Modified Independent ?Bed Mobility: Supine to Sit ?  ?  ?Supine to sit: Supervision ?  ?  ?General bed mobility comments: Pt did well with bed mobility supine to sit, swelling decreased from yesterday and showing good mobility/control with LEs ?  ? ?Transfers ?Overall transfer level: Needs assistance ?Equipment used: Rolling walker (2 wheels) ?Transfers: Sit to/from Stand ?Sit to Stand: Min guard, Min assist, From elevated surface ?  ?  ?  ?  ?  ?General transfer comment: Elevated pt's bed ~5" before he was able to rise w/o direct assist.  Again pt needing heavy forward lean (did pull from sink bowl as well) to attain standing.  Did multiple bouts of standing with tolerance being less than 30 seconds on each attempt ?  ? ?Ambulation/Gait ?  ?  ?  ?  ?  ?  ?  ?General Gait Details: no true ambulation (today or at baseline) but he was able to take a few small shuffling along EOB steps before fatigue set in and he needed to abruptly sit ? ? ?Stairs ?  ?  ?  ?  ?  ? ? ?Wheelchair Mobility ?  ? ?Modified Rankin (Stroke Patients Only) ?  ? ? ?  ?  Balance Overall balance assessment: Needs assistance ?Sitting-balance support: Feet supported ?Sitting balance-Leahy Scale: Good ?  ?  ?Standing balance support: Bilateral upper extremity supported ?Standing balance-Leahy Scale: Fair ?Standing balance comment: reliant on walker, forward leaning/unable to attain fully upright ?  ?  ?  ?  ?  ?  ?  ?  ?  ?  ?  ?  ? ?  ?Cognition Arousal/Alertness: Awake/alert ?Behavior  During Therapy: [facility] for tasks assessed/performed ?Overall Cognitive Status: Within Functional Limits for tasks assessed ?  ?  ?  ?  ?  ?  ?  ?  ?  ?  ?  ?  ?  ?  ?  ?  ?  ?  ?  ? ?  ?Exercises   ? ?  ?General Comments General comments (skin integrity, edema, etc.): Pt on 3L on arrival with sats in the mid 90s, brief trial w/o O2 and sats quickly dropped into the 80s.  Pt showed poor activity/standing tolerance and even on 3L had drop in O2 in to the high 80s/low 90s with brief/modest efforts ?  ?  ? ?Pertinent Vitals/Pain Pain Assessment ?Pain Assessment: No/denies pain  ? ? ?Home Living   ?  ?  ?  ?  ?  ?  ?  ?  ?  ?   ?  ?Prior Function    ?  ?  ?   ? ?PT Goals (current goals can now be found in the care plan section) Progress towards PT goals: Progressing toward goals ? ?  ?Frequency ? ? ? Min 2X/week ? ? ? ?  ?PT Plan Current plan remains appropriate  ? ? ?Co-evaluation   ?  ?  ?  ?  ? ?  ?AM-PAC PT "6 Clicks" Mobility   ?Outcome Measure ? Help needed turning from your back to your side while in a flat bed without using bedrails?: None ?Help needed moving from lying on your back to sitting on the side of a flat bed without using bedrails?: None ?Help needed moving to and from a bed to a chair (including a wheelchair)?: A Little ?Help needed standing up from a chair using your arms (e.g., wheelchair or bedside chair)?: A Little ?Help needed to walk in hospital room?: Total ?Help needed climbing 3-5 steps with a railing? : Total ?6 Click Score: 16 ? ?  ?End of Session Equipment Utilized During Treatment: Gait belt ?Activity Tolerance: Patient tolerated treatment well;Patient limited by fatigue ?Patient left: with call bell/phone within reach;with bed alarm set ?Nurse Communication: Mobility status ?PT Visit Diagnosis: Muscle weakness (generalized) (M62.81);Difficulty in walking, not elsewhere classified (R26.2) ?  ? ? ?Time: 2536-6440 ?PT Time Calculation (min) (ACUTE ONLY): 27 min ? ?Charges:  $Therapeutic  Activity: 8-22 mins          ?          ? ?Kreg Shropshire, DPT ?02/28/2022, 5:27 PM ? ?

## 2022-02-28 NOTE — Consult Note (Addendum)
? ?  Heart Failure Nurse Navigator Note ? ?HFpEF 65 to 70%.  Mild LVH.  Right ventricular systolic function is low normal. ? ?Presented to the emergency room from home with complaints of 2-day history of worsening shortness of breath.  Also noted worsening lower extremity edema. ? ?Comorbidities: ? ?COPD ?Former tobacco abuse ?Hypertension ?Hyperlipidemia ?Chronic kidney disease stage II ?Morbid obesity ?Arthritis ? ?Medications: ? ?Atorvastatin 80 mg daily ?Furosemide 40 mg IV 2 times a day ?Lisinopril 40 mg daily ?Metoprolol succinate 100 mg daily ?Potassium chloride 20 mEq daily ? ?Labs: ? ?Sodium 137, potassium 4, chloride 100, CO2 29, BUN 45 up from 32 of yesterday, creatinine 1.71 up from 1.61 of yesterday, albumin 3.4, estimated GFR 41 ? ? ?Initial meeting with patient, he states that he lives by himself but that he has a close lady friend that comes to his house daily that helps with taking care of the home and cooking the meals. ? ?He does not use salt, states he remove that from his diet 2 weeks ago. He has been talking with the lady that prepares his meals and discussing the changes that need to take place.  Planning on eating meats that are broiled, grilled or baked. ? ?Discussed fluid restriction of 64 ounces.  He states prior to today that he had felt that he needed to compensate by drinking more water beings he was on Lasix.  Explained that that defeats the purpose of taking the Lasix.  He voices understanding. ? ? ? ?He states that he tries to weigh himself daily, but sometimes is unsteady on his feet. ? ?He was given a living with heart failure teaching booklet, zone magnet, information on heart failure and low sodium. ? ?He will follow-up in the outpatient heart failure clinic April 26 at 2 PM.  He has a 2% no-show ratio of which is 1 out of 42 appointments. ? ?Pricilla Riffle RN CHFN ?

## 2022-03-01 MED ORDER — BENZONATATE 200 MG PO CAPS: 200.0000 mg | ORAL_CAPSULE | Freq: Three times a day (TID) | ORAL | 0 refills | Status: AC

## 2022-03-01 NOTE — Progress Notes (Signed)
[facility] to deliver toc oxygen concentrator to patient's room to ensure he has around the clock oxgyen until they are able to come do home set up tonight vs this morning depending on EMS pick up.  ? ?CSW talked to EMS crew chief and they have agreed to transport patient with this tank , CSW has called Andee Poles with EMS to inform and she is noting in the call for EMS that they are to transport patient with tank.  ? Pricilla Riffle, [place] ?[phone] ? ?

## 2022-03-01 NOTE — Discharge Instructions (Signed)
Advised to follow-up with primary care physician in 1 week. Advised to continue current medications as prescribed. 

## 2022-03-01 NOTE — TOC Transition Note (Signed)
Transition of Care (TOC) - CM/SW Discharge Note ? ? ?Patient Details  ?Name: Edwin Donovan. ?MRN: [id number] ?Date of Birth: 01-Apr-1946 ? ?Transition of Care (TOC) CM/SW Contact:  ?Alberteen Sam, LCSW ?Phone Number: ?03/01/2022, 8:56 AM ? ? ?Clinical Narrative:    ? ?Patient to discharge home today with [facility] PT and RN through [place] with [facility] informed of dc today.  ? ?Patient requiring new 3L O2, CSW has ordered via [place] with [facility]. O2 will be delivered to room for discharge and home set up to follow.  ? ?MD informed of need for home health orders and order for O2.  ? ?No further discharge needs identified at this time.  ? ? ?Final next level of care: [place] ?Barriers to Discharge: No Barriers Identified ? ? ?Patient Goals and CMS Choice ?Patient states their goals for this hospitalization and ongoing recovery are:: to go home ?CMS Medicare.gov Compare Post Acute Care list provided to:: Patient ?Choice offered to / list presented to : Patient ? ?Discharge Placement ?  ?           ?  ?  ?  ?Patient and family notified of of transfer: 03/01/22 ? ?Discharge Plan and Services ?  ?  ?           ?DME Arranged: Oxygen ?DME Agency: [facility] ?Date DME Agency Contacted: 03/01/22 ?Time DME Agency Contacted: [phone] ?Representative spoke with at DME Agency: Suanne Marker ?HH Arranged: PT, RN ?[place] Agency: [place] ([place]) ?Date HH Agency Contacted: 03/01/22 ?Time [place]: [phone] ?Representative spoke with at [place]: Corene Cornea ? ?Social Determinants of Health (SDOH) Interventions ?  ? ? ?Readmission Risk Interventions ?   ? View : No data to display.  ?  ?  ?  ? ? ? ? ? ?

## 2022-03-01 NOTE — Progress Notes (Signed)
Patient discharged home via ambulance. Home health agency brought tank to room for patient to go home with and ensured me that they will be out to the patient's home tonight to bring patient more oxygen. Patient calling agency as he was leaving the room to let them know he was on the way home. EMS aware of the situation.  ?

## 2022-03-01 NOTE — Progress Notes (Signed)
? ?  Heart Failure Nurse Navigator Note ? ? ?Met with patient, he is lying in bed on 3 L of O2 per nasal cannula in no acute distress.  He states that he is being discharged home today. ? ?By teach back method went over daily weights, what to report,.  Stressed sticking with a 64 ounce fluid restriction.  That includes coffee, tea, juice, water, milk or the items that melt at room temperature like ice cream,Jell-O and pudding. ? ?He states that he is followed by a [employer] program at the [place] that obtains his daily weights, blood pressure and oxygenation.  He states that he has been followed by them for a year and a half.  Explained that our ventricle health program only lasts for 4 months, so felt it would be better that he remains with the [facility] since he has a history with them.  He voices understanding. ? ?Also discussed trying to remain as physically active as he can tolerate.  He voices understanding. ? ?Pricilla Riffle RN CHFN ?

## 2022-03-01 NOTE — Discharge Summary (Addendum)
Physician Discharge Summary  ?[name] LEX:[id number] DOB: 14-Dec-1945 DOA: 02/26/2022 ? ?PCP: System, Provider Not In ? ?Admit date: 02/26/2022 ? ?Discharge date: 03/01/2022 ? ?Admitted From: Home. ? ?Disposition: Home health services. ? ?Recommendations for Outpatient Follow-up:  ?Follow up with PCP in 1-2 weeks @ [facility].Marland Kitchen ?Please obtain BMP/CBC in one week ?Advised to continue current medications as prescribed. ? ?Home Health: Home health PT and OT ?Equipment/Devices: Home oxygen at 3 L ? ?Discharge Condition: Stable ?CODE STATUS:Full code ?Diet recommendation: Heart Healthy ? ?Brief Summary/ Hospital Course: ?This 76 years old male with PMH significant for COPD not on home oxygen, former tobacco user, quit smoking 1 year ago, BPH, essential hypertension, hyperlipidemia, CKD stage II presented in the ED from home with complaints of worsening shortness of breath for 2 days.  Patient also reported nonproductive cough associated with bilateral worsening lower extremity edema.  He denies any chest pain, patient was hypoxic with SPO2 86%  on room air after EMS arrived.  Appears volume overloaded on exam on arrival in the ED.  Patient has una boot in right lower extremity.  Chest x-ray revealed pulmonary edema.  Patient was started on IV diuresis. Patient was admitted for possible CHF exacerbation requiring IV diuresis.  Patient was successfully diuresed, felt significantly improved.  2D echocardiogram shows improved LVEF 60 to 65%.  Patient feels better, home health services been arranged.  Home oxygen has been arranged.  Spoke with patient's PCP at [facility], communicated patient's hospitalization.  Patient is being discharged home and will follow up with the [facility]. ? ? ?Discharge Diagnoses:  ?Principal Problem: ?  Acute CHF (congestive heart failure) ([place]) ? ?Acute on chronic diastolic CHF: ?Patient presented with 2-day history of gradually worsening shortness of breath with  minimal exertion. ?He appeared volume overloaded on exam. ?Chest x-ray showed findings consistent with pulmonary edema. ?He is started on IV diuresis in the ED, Continue IV Lasix 40 mg twice daily with potassium replacement. ?Monitor daily weight, intake output charting. ?Last 2D echo in 2017 revealed LVEF 50 to 55% ?Repeat 2D echocardiogram shows EF 60 to 65%.  Improved from prior. ?  ?  ?Intake/Output Summary (Last 24 hours) at 02/28/2022 1320 ?Last data filed at 02/28/2022 1114 ?   ?Gross per 24 hour  ?Intake 120 ml  ?Output 1350 ml  ?Net -1230 ml  ?  ?  ?Acute hypoxic respiratory failure sec. to pulmonary edema: ?Chest x-ray showed increasing pulmonary vascularity suggestive of pulmonary edema. ?Patient does not use oxygen at baseline.  Now requiring 4 L to maintain saturation above 94%. ?Wean off oxygen is tolerated, down to 3L/ min. ?Home oxygen has been arranged. ?  ?Elevated troponin: ?Suspect demand ischemia in the setting of acute on chronic diastolic CHF ?Patient denies any anginal symptoms.  Trop trending down. ?2d Echo: No RWMA.,  ?  ?AKI on CKD stage II: ?Baseline serum creatinine 1.05.  Creatinine on admission 1.61 ?Avoid nephrotoxic medications, dehydration, hypotension ?Serum creatinine slightly up, improving. ?  ?Anemia of chronic disease: ?H&H remains stable, no overt bleeding. ?  ?Essential hypertension: ?Continue home blood pressure medications. ?Lisinopril and metoprolol ?  ?History of tobacco use: ?Quit smoking more than 1 year ago. ?  ?Morbid obesity: ?BMI 43 ?Recommend weight loss outpatient with regular physical activity and healthy dieting. ? ? ?Discharge Instructions ? ?Discharge Instructions   ? ? Call MD for:  difficulty breathing, headache or visual disturbances   Complete by: As directed ?  ?  Call MD for:  persistant dizziness or light-headedness   Complete by: As directed ?  ? Call MD for:  persistant nausea and vomiting   Complete by: As directed ?  ? Diet - low sodium heart healthy    Complete by: As directed ?  ? Diet Carb Modified   Complete by: As directed ?  ? Discharge instructions   Complete by: As directed ?  ? Advised to follow-up with primary care physician in 1 week. ?Advised to continue current medications as prescribed.  ? Increase activity slowly   Complete by: As directed ?  ? ?  ? ?Allergies as of 03/01/2022   ?No Known Allergies ?  ? ?  ?Medication List  ?  ? ?STOP taking these medications   ? ?amLODipine 10 MG tablet ?Commonly known as: NORVASC ?  ?Cholecalciferol 25 MCG (1000 UT) tablet ?  ? ?  ? ?TAKE these medications   ? ?albuterol 108 (90 Base) MCG/ACT inhaler ?Commonly known as: VENTOLIN HFA ?Inhale 1-2 puffs into the lungs every 6 (six) hours as needed for wheezing or shortness of breath. ?  ?atorvastatin 80 MG tablet ?Commonly known as: LIPITOR ?TAKE ONE-HALF TABLET BY MOUTH AT BEDTIME FOR CHOLESTEROL ?  ?benzonatate 200 MG capsule ?Commonly known as: TESSALON ?Take 1 capsule (200 mg total) by mouth 3 (three) times daily. ?  ?budesonide-formoterol 160-4.5 MCG/ACT inhaler ?Commonly known as: SYMBICORT ?Inhale 2 puffs into the lungs 2 (two) times daily. ?  ?cetirizine 10 MG tablet ?Commonly known as: ZYRTEC ?Take 1 tablet by mouth daily. ?  ?furosemide 40 MG tablet ?Commonly known as: LASIX ?TAKE ONE TABLET BY MOUTH EVERY DAY FOR ACCUMULATION OF FLUID ?  ?lisinopril 40 MG tablet ?Commonly known as: ZESTRIL ?Take 40 mg by mouth daily. ?  ?metoprolol succinate 100 MG 24 hr tablet ?Commonly known as: TOPROL-XL ?TAKE ONE TABLET BY MOUTH EVERY DAY FOR HEART AND BLOOD PRESSURE ?  ?polyethylene glycol powder 17 GM/SCOOP powder ?Commonly known as: GLYCOLAX/MIRALAX ?MIX 17GM (1 CAPFUL) BY MOUTH EVERY DAY AS NEEDED MIX 17 GRAMS (USE BOTTLE CAP) IN LIQUID OF CHOICE AS DIRECTED FOR CONSTIPATION ?  ?traZODone 50 MG tablet ?Commonly known as: DESYREL ?Take 1 tablet by mouth at bedtime as needed. ?  ? ?  ? ?  ?  ? ? ?  ?Durable Medical Equipment  ?(From admission, onward)  ?  ? ? ?  ? ?   Start     Ordered  ? 03/01/22 1028  DME Oxygen  Once       ?Question Answer Comment  ?Length of Need Lifetime   ?Mode or (Route) Nasal cannula   ?Liters per Minute 3   ?Frequency Continuous (stationary and portable oxygen unit needed)   ?Oxygen conserving device Yes   ?Oxygen delivery system Gas   ?  ? 03/01/22 1028  ? ?  ?  ? ?  ? ? Follow-up Information   ? ? [facility] Follow up in 1 week(s).   ?Specialty: General Practice ?Contact information: ?[address] ?[place] [address] ?[phone] ? ? ?  ?  ? ?  ?  ? ?  ? ?No Known Allergies ? ?Consultations: ?None ? ? ?Procedures/Studies: ?DG Chest Port 1 View ? ?Result Date: 02/27/2022 ?CLINICAL DATA:  Shortness of breath. EXAM: PORTABLE CHEST 1 VIEW COMPARISON:  None. FINDINGS: Moderate severity diffusely increased interstitial lung markings are seen. This is mildly increased in severity when compared to the prior study. Mild areas of atelectasis  are seen within the bilateral lung bases, left slightly greater than right. There is a small left pleural effusion. No pneumothorax is identified. The heart size and mediastinal contours are within normal limits. Multilevel degenerative changes are noted throughout the thoracic spine. IMPRESSION: 1. Moderate severity interstitial edema versus interstitial lung disease. 2. Mild bibasilar atelectasis, left slightly greater than right. 3. Small left pleural effusion. Electronically Signed   By: Virgina Norfolk M.D.   On: 02/27/2022 00:28  ? ?ECHOCARDIOGRAM COMPLETE ? ?Result Date: 02/27/2022 ?   ECHOCARDIOGRAM REPORT   Patient Name:   Elpidio Thielen. Date of Exam: 02/27/2022 Medical Rec #:  [id number]              Height:       71.0 in Accession #:    [id number]             Weight:       312.0 lb Date of Birth:  [birth year]              BSA:          2.547 m? Patient Age:    76 years               BP:           132/80 mmHg Patient Gender: M                      HR:           91 bpm. Exam Location:  [facility]  Procedure: 2D Echo, Color Doppler and Cardiac Doppler Indications:     CHF-acute diastolic D35.70  History:         Patient has prior history of Echocardiogram examinations, most                  recent 8/

## 2022-03-01 NOTE — Progress Notes (Signed)
SATURATION QUALIFICATIONS: (This note is used to comply with regulatory documentation for home oxygen) ? ?Patient Saturations on Room Air at Rest = 84 ? ?Patient Saturations on Room Air while Ambulating = 82% ? ?Patient Saturations on 3 Liters of oxygen while Ambulating = 89% ? ?Please briefly explain why patient needs home oxygen:Pt sats will not tolerate room air  ?

## 2022-03-07 ENCOUNTER — Telehealth: Payer: Self-pay

## 2022-03-07 NOTE — Telephone Encounter (Signed)
? ?  Patient called office wanting to switch from the cardiologists at the [place] to someone here in [place].  Given the addresses and phone numbers of the two main groups in [place].  ? ?Went on to ask him how he is doing since his discharge on Friday the 14. ? ?He has not been weighing himself as he is unsteady on his feet and [facility] to put in his wheel chair. ? ?He is sticking with the fluid restriction 60-64 ounces-although he said it is hard. ? ?No worsening SOB or swelling. ? ?He states he has not been sleeping well but has not used the Trazodone either.  States he might try it for a couple of nights. ? ?He has been taking his medications as ordered. ? ?Reminded of [facility] appointment on April 26 at 2 PM. ? ?He had no further questions. ? ?Edwin Donovan ?

## 2022-03-08 ENCOUNTER — Other Ambulatory Visit: Payer: Self-pay

## 2022-03-08 DIAGNOSIS — Z936 Other artificial openings of urinary tract status: Secondary | ICD-10-CM

## 2022-03-08 DIAGNOSIS — G894 Chronic pain syndrome: Secondary | ICD-10-CM | POA: Diagnosis not present

## 2022-03-08 DIAGNOSIS — E440 Moderate protein-calorie malnutrition: Secondary | ICD-10-CM | POA: Diagnosis not present

## 2022-03-08 DIAGNOSIS — M329 Systemic lupus erythematosus, unspecified: Secondary | ICD-10-CM | POA: Diagnosis not present

## 2022-03-08 DIAGNOSIS — E785 Hyperlipidemia, unspecified: Secondary | ICD-10-CM | POA: Diagnosis not present

## 2022-03-08 DIAGNOSIS — Z9981 Dependence on supplemental oxygen: Secondary | ICD-10-CM

## 2022-03-08 DIAGNOSIS — R6889 Other general symptoms and signs: Secondary | ICD-10-CM | POA: Diagnosis not present

## 2022-03-08 DIAGNOSIS — I130 Hypertensive heart and chronic kidney disease with heart failure and stage 1 through stage 4 chronic kidney disease, or unspecified chronic kidney disease: Secondary | ICD-10-CM | POA: Diagnosis present

## 2022-03-08 DIAGNOSIS — Z515 Encounter for palliative care: Secondary | ICD-10-CM | POA: Diagnosis not present

## 2022-03-08 DIAGNOSIS — C771 Secondary and unspecified malignant neoplasm of intrathoracic lymph nodes: Secondary | ICD-10-CM | POA: Diagnosis present

## 2022-03-08 DIAGNOSIS — J9602 Acute respiratory failure with hypercapnia: Secondary | ICD-10-CM | POA: Diagnosis present

## 2022-03-08 DIAGNOSIS — J9811 Atelectasis: Secondary | ICD-10-CM | POA: Diagnosis present

## 2022-03-08 DIAGNOSIS — R0602 Shortness of breath: Secondary | ICD-10-CM | POA: Diagnosis not present

## 2022-03-08 DIAGNOSIS — I5033 Acute on chronic diastolic (congestive) heart failure: Secondary | ICD-10-CM | POA: Diagnosis present

## 2022-03-08 DIAGNOSIS — J439 Emphysema, unspecified: Secondary | ICD-10-CM | POA: Diagnosis not present

## 2022-03-08 DIAGNOSIS — Z85528 Personal history of other malignant neoplasm of kidney: Secondary | ICD-10-CM

## 2022-03-08 DIAGNOSIS — N1831 Chronic kidney disease, stage 3a: Secondary | ICD-10-CM | POA: Diagnosis present

## 2022-03-08 DIAGNOSIS — J910 Malignant pleural effusion: Secondary | ICD-10-CM | POA: Diagnosis present

## 2022-03-08 DIAGNOSIS — D631 Anemia in chronic kidney disease: Secondary | ICD-10-CM | POA: Diagnosis present

## 2022-03-08 DIAGNOSIS — C3412 Malignant neoplasm of upper lobe, left bronchus or lung: Secondary | ICD-10-CM | POA: Diagnosis not present

## 2022-03-08 DIAGNOSIS — Z8546 Personal history of malignant neoplasm of prostate: Secondary | ICD-10-CM

## 2022-03-08 DIAGNOSIS — I959 Hypotension, unspecified: Secondary | ICD-10-CM | POA: Diagnosis present

## 2022-03-08 DIAGNOSIS — D696 Thrombocytopenia, unspecified: Secondary | ICD-10-CM | POA: Diagnosis not present

## 2022-03-08 DIAGNOSIS — R069 Unspecified abnormalities of breathing: Secondary | ICD-10-CM | POA: Diagnosis not present

## 2022-03-08 DIAGNOSIS — R1310 Dysphagia, unspecified: Secondary | ICD-10-CM | POA: Diagnosis present

## 2022-03-08 DIAGNOSIS — R339 Retention of urine, unspecified: Secondary | ICD-10-CM | POA: Diagnosis present

## 2022-03-08 DIAGNOSIS — Z8249 Family history of ischemic heart disease and other diseases of the circulatory system: Secondary | ICD-10-CM

## 2022-03-08 DIAGNOSIS — Z79899 Other long term (current) drug therapy: Secondary | ICD-10-CM

## 2022-03-08 DIAGNOSIS — Z20822 Contact with and (suspected) exposure to covid-19: Secondary | ICD-10-CM | POA: Diagnosis present

## 2022-03-08 DIAGNOSIS — Z66 Do not resuscitate: Secondary | ICD-10-CM | POA: Diagnosis not present

## 2022-03-08 DIAGNOSIS — Z9079 Acquired absence of other genital organ(s): Secondary | ICD-10-CM

## 2022-03-08 DIAGNOSIS — Z7951 Long term (current) use of inhaled steroids: Secondary | ICD-10-CM

## 2022-03-08 DIAGNOSIS — J9621 Acute and chronic respiratory failure with hypoxia: Secondary | ICD-10-CM | POA: Diagnosis present

## 2022-03-08 DIAGNOSIS — Z801 Family history of malignant neoplasm of trachea, bronchus and lung: Secondary | ICD-10-CM

## 2022-03-08 DIAGNOSIS — Z905 Acquired absence of kidney: Secondary | ICD-10-CM

## 2022-03-08 DIAGNOSIS — Z743 Need for continuous supervision: Secondary | ICD-10-CM | POA: Diagnosis not present

## 2022-03-08 DIAGNOSIS — K5900 Constipation, unspecified: Secondary | ICD-10-CM | POA: Diagnosis present

## 2022-03-08 DIAGNOSIS — Z841 Family history of disorders of kidney and ureter: Secondary | ICD-10-CM

## 2022-03-08 DIAGNOSIS — I2699 Other pulmonary embolism without acute cor pulmonale: Secondary | ICD-10-CM | POA: Diagnosis not present

## 2022-03-08 DIAGNOSIS — J441 Chronic obstructive pulmonary disease with (acute) exacerbation: Secondary | ICD-10-CM | POA: Diagnosis not present

## 2022-03-08 DIAGNOSIS — I2693 Single subsegmental pulmonary embolism without acute cor pulmonale: Principal | ICD-10-CM | POA: Diagnosis present

## 2022-03-08 DIAGNOSIS — Z6841 Body Mass Index (BMI) 40.0 and over, adult: Secondary | ICD-10-CM

## 2022-03-08 DIAGNOSIS — F17210 Nicotine dependence, cigarettes, uncomplicated: Secondary | ICD-10-CM | POA: Diagnosis present

## 2022-03-08 DIAGNOSIS — F4310 Post-traumatic stress disorder, unspecified: Secondary | ICD-10-CM | POA: Diagnosis present

## 2022-03-08 DIAGNOSIS — R918 Other nonspecific abnormal finding of lung field: Secondary | ICD-10-CM | POA: Diagnosis not present

## 2022-03-08 NOTE — ED Triage Notes (Signed)
Pt states shob and leg edema. Pt is on oxygen at 3lpm [place]. Pt with 3+ edema noted to legs. Pt appears in no acute distress.  ?

## 2022-03-09 ENCOUNTER — Emergency Department: Payer: Medicare Other

## 2022-03-09 ENCOUNTER — Inpatient Hospital Stay
Admission: EM | Admit: 2022-03-09 | Discharge: 2022-03-28 | DRG: 166 | Disposition: A | Discharge status: Hospice | Payer: Medicare Other | Attending: Internal Medicine | Admitting: Internal Medicine

## 2022-03-09 ENCOUNTER — Inpatient Hospital Stay (HOSPITAL_COMMUNITY)
Admit: 2022-03-09 | Discharge: 2022-03-09 | Disposition: A | Discharge status: Home / Self Care | Payer: Medicare Other | Attending: Family Medicine | Admitting: Family Medicine

## 2022-03-09 DIAGNOSIS — E440 Moderate protein-calorie malnutrition: Secondary | ICD-10-CM | POA: Insufficient documentation

## 2022-03-09 DIAGNOSIS — I10 Essential (primary) hypertension: Secondary | ICD-10-CM | POA: Diagnosis not present

## 2022-03-09 DIAGNOSIS — K5730 Diverticulosis of large intestine without perforation or abscess without bleeding: Secondary | ICD-10-CM | POA: Diagnosis not present

## 2022-03-09 DIAGNOSIS — N179 Acute kidney failure, unspecified: Secondary | ICD-10-CM

## 2022-03-09 DIAGNOSIS — D631 Anemia in chronic kidney disease: Secondary | ICD-10-CM | POA: Diagnosis present

## 2022-03-09 DIAGNOSIS — Z85528 Personal history of other malignant neoplasm of kidney: Secondary | ICD-10-CM | POA: Diagnosis not present

## 2022-03-09 DIAGNOSIS — E785 Hyperlipidemia, unspecified: Secondary | ICD-10-CM | POA: Diagnosis present

## 2022-03-09 DIAGNOSIS — I130 Hypertensive heart and chronic kidney disease with heart failure and stage 1 through stage 4 chronic kidney disease, or unspecified chronic kidney disease: Secondary | ICD-10-CM | POA: Diagnosis present

## 2022-03-09 DIAGNOSIS — I5033 Acute on chronic diastolic (congestive) heart failure: Secondary | ICD-10-CM

## 2022-03-09 DIAGNOSIS — R0602 Shortness of breath: Secondary | ICD-10-CM | POA: Diagnosis not present

## 2022-03-09 DIAGNOSIS — F17210 Nicotine dependence, cigarettes, uncomplicated: Secondary | ICD-10-CM | POA: Diagnosis not present

## 2022-03-09 DIAGNOSIS — C771 Secondary and unspecified malignant neoplasm of intrathoracic lymph nodes: Secondary | ICD-10-CM | POA: Diagnosis not present

## 2022-03-09 DIAGNOSIS — Z85118 Personal history of other malignant neoplasm of bronchus and lung: Secondary | ICD-10-CM | POA: Diagnosis not present

## 2022-03-09 DIAGNOSIS — C61 Malignant neoplasm of prostate: Secondary | ICD-10-CM | POA: Diagnosis present

## 2022-03-09 DIAGNOSIS — I619 Nontraumatic intracerebral hemorrhage, unspecified: Secondary | ICD-10-CM | POA: Diagnosis not present

## 2022-03-09 DIAGNOSIS — R918 Other nonspecific abnormal finding of lung field: Secondary | ICD-10-CM | POA: Diagnosis not present

## 2022-03-09 DIAGNOSIS — R911 Solitary pulmonary nodule: Secondary | ICD-10-CM | POA: Diagnosis not present

## 2022-03-09 DIAGNOSIS — R338 Other retention of urine: Secondary | ICD-10-CM

## 2022-03-09 DIAGNOSIS — J9811 Atelectasis: Secondary | ICD-10-CM | POA: Diagnosis not present

## 2022-03-09 DIAGNOSIS — J9859 Other diseases of mediastinum, not elsewhere classified: Secondary | ICD-10-CM | POA: Diagnosis not present

## 2022-03-09 DIAGNOSIS — Z6841 Body Mass Index (BMI) 40.0 and over, adult: Secondary | ICD-10-CM | POA: Diagnosis not present

## 2022-03-09 DIAGNOSIS — I700 Atherosclerosis of aorta: Secondary | ICD-10-CM | POA: Diagnosis not present

## 2022-03-09 DIAGNOSIS — I2699 Other pulmonary embolism without acute cor pulmonale: Secondary | ICD-10-CM | POA: Diagnosis not present

## 2022-03-09 DIAGNOSIS — I959 Hypotension, unspecified: Secondary | ICD-10-CM | POA: Diagnosis present

## 2022-03-09 DIAGNOSIS — J439 Emphysema, unspecified: Secondary | ICD-10-CM | POA: Diagnosis not present

## 2022-03-09 DIAGNOSIS — J9621 Acute and chronic respiratory failure with hypoxia: Secondary | ICD-10-CM | POA: Diagnosis not present

## 2022-03-09 DIAGNOSIS — C3412 Malignant neoplasm of upper lobe, left bronchus or lung: Secondary | ICD-10-CM

## 2022-03-09 DIAGNOSIS — Z515 Encounter for palliative care: Secondary | ICD-10-CM | POA: Diagnosis not present

## 2022-03-09 DIAGNOSIS — I2609 Other pulmonary embolism with acute cor pulmonale: Secondary | ICD-10-CM | POA: Diagnosis not present

## 2022-03-09 DIAGNOSIS — J90 Pleural effusion, not elsewhere classified: Secondary | ICD-10-CM

## 2022-03-09 DIAGNOSIS — J441 Chronic obstructive pulmonary disease with (acute) exacerbation: Secondary | ICD-10-CM | POA: Diagnosis not present

## 2022-03-09 DIAGNOSIS — G894 Chronic pain syndrome: Secondary | ICD-10-CM | POA: Diagnosis present

## 2022-03-09 DIAGNOSIS — I2693 Single subsegmental pulmonary embolism without acute cor pulmonale: Secondary | ICD-10-CM | POA: Diagnosis present

## 2022-03-09 DIAGNOSIS — I509 Heart failure, unspecified: Secondary | ICD-10-CM | POA: Diagnosis not present

## 2022-03-09 DIAGNOSIS — L930 Discoid lupus erythematosus: Secondary | ICD-10-CM | POA: Diagnosis present

## 2022-03-09 DIAGNOSIS — J910 Malignant pleural effusion: Secondary | ICD-10-CM | POA: Diagnosis present

## 2022-03-09 DIAGNOSIS — Z01810 Encounter for preprocedural cardiovascular examination: Secondary | ICD-10-CM | POA: Diagnosis not present

## 2022-03-09 DIAGNOSIS — D696 Thrombocytopenia, unspecified: Secondary | ICD-10-CM | POA: Diagnosis present

## 2022-03-09 DIAGNOSIS — Z66 Do not resuscitate: Secondary | ICD-10-CM | POA: Diagnosis present

## 2022-03-09 DIAGNOSIS — R5381 Other malaise: Secondary | ICD-10-CM | POA: Diagnosis not present

## 2022-03-09 DIAGNOSIS — I714 Abdominal aortic aneurysm, without rupture, unspecified: Secondary | ICD-10-CM | POA: Diagnosis not present

## 2022-03-09 DIAGNOSIS — Z905 Acquired absence of kidney: Secondary | ICD-10-CM | POA: Diagnosis not present

## 2022-03-09 DIAGNOSIS — Z7401 Bed confinement status: Secondary | ICD-10-CM | POA: Diagnosis not present

## 2022-03-09 DIAGNOSIS — C3490 Malignant neoplasm of unspecified part of unspecified bronchus or lung: Secondary | ICD-10-CM | POA: Diagnosis not present

## 2022-03-09 DIAGNOSIS — J9602 Acute respiratory failure with hypercapnia: Secondary | ICD-10-CM | POA: Diagnosis present

## 2022-03-09 DIAGNOSIS — Z20822 Contact with and (suspected) exposure to covid-19: Secondary | ICD-10-CM | POA: Diagnosis present

## 2022-03-09 DIAGNOSIS — N1831 Chronic kidney disease, stage 3a: Secondary | ICD-10-CM | POA: Diagnosis not present

## 2022-03-09 DIAGNOSIS — R404 Transient alteration of awareness: Secondary | ICD-10-CM | POA: Diagnosis not present

## 2022-03-09 DIAGNOSIS — Z9889 Other specified postprocedural states: Secondary | ICD-10-CM

## 2022-03-09 DIAGNOSIS — N281 Cyst of kidney, acquired: Secondary | ICD-10-CM | POA: Diagnosis not present

## 2022-03-09 DIAGNOSIS — M329 Systemic lupus erythematosus, unspecified: Secondary | ICD-10-CM | POA: Diagnosis present

## 2022-03-09 DIAGNOSIS — N189 Chronic kidney disease, unspecified: Secondary | ICD-10-CM | POA: Insufficient documentation

## 2022-03-09 LAB — HEPATIC FUNCTION PANEL
ALT: 15 U/L (ref 0–44)
AST: 17 U/L (ref 15–41)
Albumin: 3.8 g/dL (ref 3.5–5.0)
Alkaline Phosphatase: 62 U/L (ref 38–126)
Bilirubin, Direct: 0.1 mg/dL (ref 0.0–0.2)
Indirect Bilirubin: 0.4 mg/dL (ref 0.3–0.9)
Total Bilirubin: 0.5 mg/dL (ref 0.3–1.2)
Total Protein: 8.2 g/dL — ABNORMAL HIGH (ref 6.5–8.1)

## 2022-03-09 LAB — ECHOCARDIOGRAM COMPLETE
Area-P 1/2: 5.46 cm2
Calc EF: 77.3 %
S' Lateral: 2.95 cm
Single Plane A2C EF: 73.4 %
Single Plane A4C EF: 79.7 %

## 2022-03-09 LAB — CBC
HCT: 38.5 % — ABNORMAL LOW (ref 39.0–52.0)
Hemoglobin: 11.7 g/dL — ABNORMAL LOW (ref 13.0–17.0)
MCH: 30 pg (ref 26.0–34.0)
MCHC: 30.4 g/dL (ref 30.0–36.0)
MCV: 98.7 fL (ref 80.0–100.0)
Platelets: 140 10*3/uL — ABNORMAL LOW (ref 150–400)
RBC: 3.9 MIL/uL — ABNORMAL LOW (ref 4.22–5.81)
RDW: 14.7 % (ref 11.5–15.5)
WBC: 7.9 10*3/uL (ref 4.0–10.5)
nRBC: 0 % (ref 0.0–0.2)

## 2022-03-09 LAB — HEPARIN LEVEL (UNFRACTIONATED)
Heparin Unfractionated: 0.52 IU/mL (ref 0.30–0.70)
Heparin Unfractionated: 0.66 IU/mL (ref 0.30–0.70)

## 2022-03-09 LAB — PROTIME-INR
INR: 1.1 (ref 0.8–1.2)
Prothrombin Time: 13.9 seconds (ref 11.4–15.2)

## 2022-03-09 LAB — BRAIN NATRIURETIC PEPTIDE
B Natriuretic Peptide: 117.8 pg/mL — ABNORMAL HIGH (ref 0.0–100.0)
B Natriuretic Peptide: 42.5 pg/mL (ref 0.0–100.0)

## 2022-03-09 LAB — RESP PANEL BY RT-PCR (FLU A&B, COVID) ARPGX2
Influenza A by PCR: NEGATIVE
Influenza B by PCR: NEGATIVE
SARS Coronavirus 2 by RT PCR: NEGATIVE

## 2022-03-09 LAB — TROPONIN I (HIGH SENSITIVITY)
Troponin I (High Sensitivity): 30 ng/L — ABNORMAL HIGH (ref <18)
Troponin I (High Sensitivity): 32 ng/L — ABNORMAL HIGH (ref <18)

## 2022-03-09 LAB — BASIC METABOLIC PANEL
Anion gap: 9 (ref 5–15)
BUN: 55 mg/dL — ABNORMAL HIGH (ref 8–23)
CO2: 25 mmol/L (ref 22–32)
Calcium: 9.3 mg/dL (ref 8.9–10.3)
Chloride: 103 mmol/L (ref 98–111)
Creatinine, Ser: 1.62 mg/dL — ABNORMAL HIGH (ref 0.61–1.24)
GFR, Estimated: 43 mL/min — ABNORMAL LOW (ref >60)
Glucose, Bld: 106 mg/dL — ABNORMAL HIGH (ref 70–99)
Potassium: 4.5 mmol/L (ref 3.5–5.1)
Sodium: 137 mmol/L (ref 135–145)

## 2022-03-09 LAB — URIC ACID: Uric Acid, Serum: 12 mg/dL — ABNORMAL HIGH (ref 3.7–8.6)

## 2022-03-09 LAB — LACTATE DEHYDROGENASE: LDH: 174 U/L (ref 98–192)

## 2022-03-09 LAB — PHOSPHORUS: Phosphorus: 2.8 mg/dL (ref 2.5–4.6)

## 2022-03-09 LAB — PROCALCITONIN: Procalcitonin: <0.1 ng/mL

## 2022-03-09 LAB — APTT: aPTT: 35 seconds (ref 24–36)

## 2022-03-09 MED ORDER — METHYLPREDNISOLONE SODIUM SUCC 125 MG IJ SOLR
125.0000 mg | Freq: Once | INTRAMUSCULAR | Status: AC
  Administered 2022-03-09: 125 mg via INTRAVENOUS
  Filled 2022-03-09: qty 2

## 2022-03-09 MED ORDER — METHYLPREDNISOLONE SODIUM SUCC 40 MG IJ SOLR
40.0000 mg | Freq: Two times a day (BID) | INTRAMUSCULAR | Status: AC
  Administered 2022-03-09: 40 mg via INTRAVENOUS
  Filled 2022-03-09 (×2): qty 1

## 2022-03-09 MED ORDER — PERFLUTREN LIPID MICROSPHERE
1.0000 mL | INTRAVENOUS | Status: AC | PRN
  Administered 2022-03-09: 2 mL via INTRAVENOUS
  Filled 2022-03-09: qty 10

## 2022-03-09 MED ORDER — HEPARIN BOLUS VIA INFUSION
6500.0000 [IU] | Freq: Once | INTRAVENOUS | Status: AC
  Administered 2022-03-09: 6500 [IU] via INTRAVENOUS
  Filled 2022-03-09: qty 6500

## 2022-03-09 MED ORDER — IPRATROPIUM-ALBUTEROL 0.5-2.5 (3) MG/3ML IN SOLN
3.0000 mL | Freq: Once | RESPIRATORY_TRACT | Status: AC
  Administered 2022-03-09: 3 mL via RESPIRATORY_TRACT
  Filled 2022-03-09: qty 3

## 2022-03-09 MED ORDER — POLYETHYLENE GLYCOL 3350 17 G PO PACK
17.0000 g | PACK | Freq: Every day | ORAL | Status: DC
  Administered 2022-03-09 – 2022-03-25 (×11): 17 g via ORAL
  Filled 2022-03-09 (×12): qty 1

## 2022-03-09 MED ORDER — LISINOPRIL 10 MG PO TABS: 40.0000 mg | ORAL_TABLET | Freq: Every day | ORAL | Status: DC

## 2022-03-09 MED ORDER — IOHEXOL 350 MG/ML SOLN
75.0000 mL | Freq: Once | INTRAVENOUS | Status: AC | PRN
  Administered 2022-03-09: 75 mL via INTRAVENOUS

## 2022-03-09 MED ORDER — BENZONATATE 100 MG PO CAPS
200.0000 mg | ORAL_CAPSULE | Freq: Three times a day (TID) | ORAL | Status: DC
  Administered 2022-03-09 – 2022-03-15 (×20): 200 mg via ORAL
  Filled 2022-03-09 (×20): qty 2

## 2022-03-09 MED ORDER — PSEUDOEPHEDRINE HCL ER 120 MG PO TB12
120.0000 mg | ORAL_TABLET | Freq: Two times a day (BID) | ORAL | Status: DC
Start: 2022-03-09 — End: 2022-03-13
  Administered 2022-03-09 – 2022-03-13 (×9): 120 mg via ORAL
  Filled 2022-03-09 (×9): qty 1

## 2022-03-09 MED ORDER — METOPROLOL SUCCINATE ER 100 MG PO TB24
100.0000 mg | ORAL_TABLET | Freq: Every day | ORAL | Status: DC
  Administered 2022-03-09 – 2022-03-12 (×4): 100 mg via ORAL
  Filled 2022-03-09: qty 1
  Filled 2022-03-09: qty 2
  Filled 2022-03-09 (×2): qty 1

## 2022-03-09 MED ORDER — SODIUM CHLORIDE 0.9 % IV SOLN: INTRAVENOUS | Status: DC

## 2022-03-09 MED ORDER — FUROSEMIDE 40 MG PO TABS
40.0000 mg | ORAL_TABLET | Freq: Every day | ORAL | Status: DC
  Administered 2022-03-09: 40 mg via ORAL
  Filled 2022-03-09: qty 1

## 2022-03-09 MED ORDER — ACETAMINOPHEN 325 MG PO TABS
650.0000 mg | ORAL_TABLET | Freq: Four times a day (QID) | ORAL | Status: DC | PRN
  Administered 2022-03-12 – 2022-03-27 (×4): 650 mg via ORAL
  Filled 2022-03-09 (×4): qty 2

## 2022-03-09 MED ORDER — SENNOSIDES-DOCUSATE SODIUM 8.6-50 MG PO TABS
1.0000 | ORAL_TABLET | Freq: Two times a day (BID) | ORAL | Status: DC
  Administered 2022-03-09 – 2022-03-27 (×30): 1 via ORAL
  Filled 2022-03-09 (×33): qty 1

## 2022-03-09 MED ORDER — FUROSEMIDE 10 MG/ML IJ SOLN
40.0000 mg | Freq: Two times a day (BID) | INTRAMUSCULAR | Status: DC
  Administered 2022-03-09 – 2022-03-12 (×6): 40 mg via INTRAVENOUS
  Filled 2022-03-09 (×6): qty 4

## 2022-03-09 MED ORDER — ATORVASTATIN CALCIUM 80 MG PO TABS
80.0000 mg | ORAL_TABLET | Freq: Every day | ORAL | Status: DC
Start: 2022-03-09 — End: 2022-03-28
  Administered 2022-03-09 – 2022-03-26 (×16): 80 mg via ORAL
  Filled 2022-03-09 (×3): qty 1
  Filled 2022-03-09: qty 4
  Filled 2022-03-09 (×11): qty 1
  Filled 2022-03-09: qty 4
  Filled 2022-03-09: qty 1

## 2022-03-09 MED ORDER — ACETAMINOPHEN 650 MG RE SUPP: 650.0000 mg | Freq: Four times a day (QID) | RECTAL | Status: DC | PRN

## 2022-03-09 MED ORDER — LORATADINE 10 MG PO TABS
10.0000 mg | ORAL_TABLET | Freq: Every day | ORAL | Status: DC
  Administered 2022-03-09 – 2022-03-15 (×7): 10 mg via ORAL
  Filled 2022-03-09 (×7): qty 1

## 2022-03-09 MED ORDER — TRAZODONE HCL 50 MG PO TABS: 25.0000 mg | ORAL_TABLET | Freq: Every evening | ORAL | Status: DC | PRN

## 2022-03-09 MED ORDER — TRAZODONE HCL 50 MG PO TABS
50.0000 mg | ORAL_TABLET | Freq: Every evening | ORAL | Status: DC | PRN
  Administered 2022-03-09 – 2022-03-14 (×4): 50 mg via ORAL
  Filled 2022-03-09 (×5): qty 1

## 2022-03-09 MED ORDER — IPRATROPIUM-ALBUTEROL 0.5-2.5 (3) MG/3ML IN SOLN
3.0000 mL | Freq: Four times a day (QID) | RESPIRATORY_TRACT | Status: DC | PRN
  Administered 2022-03-09 – 2022-03-17 (×15): 3 mL via RESPIRATORY_TRACT
  Filled 2022-03-09 (×16): qty 3

## 2022-03-09 MED ORDER — HEPARIN (PORCINE) 25000 UT/250ML-% IV SOLN
1600.0000 [IU]/h | INTRAVENOUS | Status: DC
  Administered 2022-03-09 (×2): 1700 [IU]/h via INTRAVENOUS
  Administered 2022-03-10 – 2022-03-11 (×3): 1600 [IU]/h via INTRAVENOUS
  Filled 2022-03-09 (×5): qty 250

## 2022-03-09 MED ORDER — BISACODYL 5 MG PO TBEC
5.0000 mg | DELAYED_RELEASE_TABLET | Freq: Every day | ORAL | Status: DC | PRN
  Administered 2022-03-09 – 2022-03-14 (×2): 5 mg via ORAL
  Filled 2022-03-09 (×2): qty 1

## 2022-03-09 MED ORDER — PREDNISONE 20 MG PO TABS
40.0000 mg | ORAL_TABLET | Freq: Every day | ORAL | Status: AC
  Administered 2022-03-10 – 2022-03-13 (×4): 40 mg via ORAL
  Filled 2022-03-09 (×4): qty 2

## 2022-03-09 MED ORDER — SODIUM CHLORIDE 0.9 % IV BOLUS (SEPSIS): 500.0000 mL | Freq: Once | INTRAVENOUS | Status: DC

## 2022-03-09 NOTE — Progress Notes (Signed)
ANTICOAGULATION CONSULT NOTE ? ?Pharmacy Consult for heparin infusion ?Indication: pulmonary embolus ? ?No Known Allergies ? ?Patient Measurements: ?Height: 5\' 11"  (180.3 cm) ?Weight: (!) 141.5 kg (312 lb) ?IBW/kg (Calculated) : 75.3 ?Heparin Dosing Weight: 108.3 kg ? ?Vital Signs: ?BP: 136/90 (04/22 1400) ?Pulse Rate: 93 (04/22 1400) ? ?Labs: ?Recent Labs  ?  03/09/22 ?0003 03/09/22 ?0201 03/09/22 ?0305 03/09/22 ?1258  ?HGB 11.7*  --   --   --   ?HCT 38.5*  --   --   --   ?PLT 140*  --   --   --   ?APTT  --   --  35  --   ?LABPROT  --   --  13.9  --   ?INR  --   --  1.1  --   ?HEPARINUNFRC  --   --   --  0.52  ?CREATININE 1.62*  --   --   --   ?TROPONINIHS 30* 32*  --   --   ? ? ? ?Estimated Creatinine Clearance: 55 mL/min (A) (by C-G formula based on SCr of 1.62 mg/dL (H)). ? ? ?Medical History: ?Past Medical History:  ?Diagnosis Date  ? Anxiety   ? ptsd  ? Arthritis   ? Cancer [facility])   ? PROSTATE  ? CHF (congestive heart failure) ([place])   ? Chronic kidney disease   ? stones  ? COPD (chronic obstructive pulmonary disease) ([place])   ? Dysrhythmia   ? History of kidney stones   ? Hypertension   ? Kidney stone   ? Pneumonia   ? 06/27/16  ? Shortness of breath dyspnea   ? ? ?Assessment: ?Pt is 76 yo male presenting to ED w/ SOB and leg edema found with "two isolated subsegmental pulmonary emboli at the bifurcation in the right upper lobe pulmonary artery." ? ?4/22 1258 HL 0.52.  ? ?Goal of Therapy:  ?Heparin level 0.3-0.7 units/ml ?Monitor platelets by anticoagulation protocol: Yes ?  ?Plan:  ?Heparin level is therapeutic. Will continue heparin infusion at 1700 units/hr. Recheck heparin level in 8 hours. CBC daily while on heparin.  ? ?Eleonore Chiquito, PharmD, BCPS ?03/09/2022 ?2:29 PM ? ? ? ?

## 2022-03-09 NOTE — ED Provider Notes (Signed)
? ?[facility] ?Provider Note ? ? ? Event Date/Time  ? First MD Initiated Contact with Patient 03/09/22 [phone]   ?  (approximate) ? ? ?History  ? ?Shortness of Breath ? ? ?HPI ? ?Edwin Donovan. is a 76 y.o. male with history of prostate cancer, COPD, CHF, hypertension who presents to the emergency department complaints of shortness of breath for the past several days.  Also reports increasing swelling to both legs over the past 4 weeks.  No chest pain.  No fever but has had dry cough.  No history of PE or DVT.  Was just admitted to the hospital 2 weeks ago for CHF exacerbation.  On discharge, was sent home on oxygen.  Has had to increase oxygen to 4 to 5 L due to his increasing shortness of breath. ? ? ?History provided by patient. ? ? ? ?Past Medical History:  ?Diagnosis Date  ? Anxiety   ? ptsd  ? Arthritis   ? Cancer [facility])   ? PROSTATE  ? CHF (congestive heart failure) ([place])   ? Chronic kidney disease   ? stones  ? COPD (chronic obstructive pulmonary disease) ([place])   ? Dysrhythmia   ? History of kidney stones   ? Hypertension   ? Kidney stone   ? Pneumonia   ? 06/27/16  ? Shortness of breath dyspnea   ? ? ?Past Surgical History:  ?Procedure Laterality Date  ? CYSTOSCOPY W/ RETROGRADES Bilateral 12/26/2017  ? Procedure: CYSTOSCOPY WITH RETROGRADE PYELOGRAM;  Surgeon: Abbie Sons, MD;  Location: [facility] ORS;  Service: Urology;  Laterality: Bilateral;  ? CYSTOSCOPY W/ RETROGRADES Right 02/09/2019  ? Procedure: CYSTOSCOPY WITH RETROGRADE PYELOGRAM;  Surgeon: Abbie Sons, MD;  Location: [facility] OR[facility];  Service: Urology;  Laterality: Right;  ? CYSTOSCOPY W/ URETERAL STENT PLACEMENT Right 07/29/2016  ? Procedure: CYSTOSCOPY WITH STENT REPLACEMENT;  Surgeon: Hollice Espy, MD;  Location: [facility];  Service: Urology;  Laterality: Right;  ? CYSTOSCOPY WITH BIOPSY Right 07/29/2016  ? Procedure: CYSTOSCOPY WITH BIOPSY;  Surgeon: Hollice Espy, MD;  Location: [facility];  Service: Urology;  Laterality:  Right;  ? CYSTOSCOPY WITH BIOPSY Right 02/09/2019  ? Procedure: CYSTOSCOPY WITH BIOPSY;  Surgeon: Abbie Sons, MD;  Location: [facility];  Service: Urology;  Laterality: Right;  ? CYSTOSCOPY WITH STENT PLACEMENT Right 06/28/2016  ? Procedure: CYSTOSCOPY WITH STENT PLACEMENT;  Surgeon: Hollice Espy, MD;  Location: [facility];  Service: Urology;  Laterality: Right;  ? CYSTOSCOPY WITH STENT PLACEMENT Right 02/09/2019  ? Procedure: CYSTOSCOPY WITH STENT PLACEMENT;  Surgeon: Abbie Sons, MD;  Location: [facility];  Service: Urology;  Laterality: Right;  ? CYSTOSCOPY/URETEROSCOPY/HOLMIUM LASER/STENT PLACEMENT Right 12/26/2017  ? Procedure: CYSTOSCOPY/URETEROSCOPY/HOLMIUM LASER/STENT PLACEMENT;  Surgeon: Abbie Sons, MD;  Location: [facility];  Service: Urology;  Laterality: Right;  ? EXTRACORPOREAL SHOCK WAVE LITHOTRIPSY Right 12/04/2017  ? Procedure: EXTRACORPOREAL SHOCK WAVE LITHOTRIPSY (ESWL);  Surgeon: Hollice Espy, MD;  Location: [facility];  Service: Urology;  Laterality: Right;  ? PROSTATECTOMY    ? URETEROSCOPY Right 07/29/2016  ? Procedure: URETEROSCOPY;  Surgeon: Hollice Espy, MD;  Location: [facility];  Service: Urology;  Laterality: Right;  ? URETEROSCOPY Right 02/09/2019  ? Procedure: URETEROSCOPY- DIAGNOSTIC;  Surgeon: Abbie Sons, MD;  Location: [facility];  Service: Urology;  Laterality: Right;  ? ? ?MEDICATIONS:  ?Prior to Admission medications   ?Medication Sig Start Date End Date Taking? Authorizing Provider  ?albuterol (PROVENTIL HFA;VENTOLIN HFA) 108 (90 Base)  MCG/ACT inhaler Inhale 1-2 puffs into the lungs every 6 (six) hours as needed for wheezing or shortness of breath.   Yes [provider]  ?atorvastatin (LIPITOR) 80 MG tablet TAKE ONE-HALF TABLET BY MOUTH AT BEDTIME FOR CHOLESTEROL 06/22/21  Yes [provider]  ?budesonide-formoterol (SYMBICORT) 160-4.5 MCG/ACT inhaler Inhale 2 puffs into the lungs 2 (two) times daily.   Yes [provider]  ?cetirizine (ZYRTEC) 10  MG tablet Take 1 tablet by mouth daily. 11/30/21  Yes [provider]  ?furosemide (LASIX) 40 MG tablet TAKE ONE TABLET BY MOUTH EVERY DAY FOR ACCUMULATION OF FLUID 01/03/22  Yes [provider]  ?lisinopril (PRINIVIL,ZESTRIL) 40 MG tablet Take 40 mg by mouth daily.   Yes [provider]  ?metoprolol succinate (TOPROL-XL) 100 MG 24 hr tablet TAKE ONE TABLET BY MOUTH EVERY DAY FOR HEART AND BLOOD PRESSURE 01/03/22  Yes [provider]  ?polyethylene glycol powder (GLYCOLAX/MIRALAX) 17 GM/SCOOP powder MIX 17GM (1 CAPFUL) BY MOUTH EVERY DAY AS NEEDED MIX 17 GRAMS (USE BOTTLE CAP) IN LIQUID OF CHOICE AS DIRECTED FOR CONSTIPATION 11/30/21  Yes [provider]  ?traZODone (DESYREL) 50 MG tablet Take 1 tablet by mouth at bedtime as needed. 08/15/21  Yes [provider]  ?benzonatate (TESSALON) 200 MG capsule Take 1 capsule (200 mg total) by mouth 3 (three) times daily. 03/01/22   Shawna Clamp, MD  ? ? ?Physical Exam  ? ?Triage Vital Signs: ?ED Triage Vitals [03/08/22 2355]  ?Enc Vitals Group  ?   BP 135/87  ?   Pulse Rate 74  ?   Resp (!) 22  ?   Temp 98.2 ?F (36.8 ?C)  ?   Temp Source Oral  ?   SpO2 95 %  ?   Weight (!) 312 lb (141.5 kg)  ?   Height 5\' 11"  (1.803 m)  ?   Head Circumference   ?   Peak Flow   ?   Pain Score 0  ?   Pain Loc   ?   Pain Edu?   ?   Excl. in [place]?   ? ? ?Most recent vital signs: ?Vitals:  ? 03/08/22 2355 03/09/22 0305  ?BP: 135/87 (!) 145/86  ?Pulse: 74 80  ?Resp: (!) 22 16  ?Temp: 98.2 ?F (36.8 ?C)   ?SpO2: 95% 95%  ? ? ?CONSTITUTIONAL: Alert and oriented and responds appropriately to questions.  Obese, elderly, chronically ill-appearing ?HEAD: Normocephalic, atraumatic ?EYES: Conjunctivae clear, pupils appear equal, sclera nonicteric ?ENT: normal nose; moist mucous membranes ?NECK: Supple, normal ROM ?CARD: RRR; S1 and S2 appreciated; no murmurs, no clicks, no rubs, no gallops ?RESP: Speaking short sentences but no respiratory distress.  No  hypoxia on 4 L nasal cannula.  Diminished aeration diffusely with some scattered expiratory wheezing.  No rhonchi or rales. ?ABD/GI: Normal bowel sounds; non-distended; soft, non-tender, no rebound, no guarding, no peritoneal signs ?BACK: The back appears normal ?EXT: Normal ROM in all joints; no deformity noted, stasis dermatitis and chronic pitting edema in bilateral lower extremities, no calf tenderness ?SKIN: Normal color for age and race; warm; no rash on exposed skin ?NEURO: Moves all extremities equally, normal speech ?PSYCH: The patient's mood and manner are appropriate. ? ? ?ED Results / Procedures / Treatments  ? ?LABS: ?(all labs ordered are listed, but only abnormal results are displayed) ?Labs Reviewed  ?BASIC METABOLIC PANEL - Abnormal; Notable for the following components:  ?    Result Value  ? Glucose, Bld  106 (*)   ? BUN 55 (*)   ? Creatinine, Ser 1.62 (*)   ? GFR, Estimated 43 (*)   ? All other components within normal limits  ?CBC - Abnormal; Notable for the following components:  ? RBC 3.90 (*)   ? Hemoglobin 11.7 (*)   ? HCT 38.5 (*)   ? Platelets 140 (*)   ? All other components within normal limits  ?TROPONIN I (HIGH SENSITIVITY) - Abnormal; Notable for the following components:  ? Troponin I (High Sensitivity) 30 (*)   ? All other components within normal limits  ?TROPONIN I (HIGH SENSITIVITY) - Abnormal; Notable for the following components:  ? Troponin I (High Sensitivity) 32 (*)   ? All other components within normal limits  ?RESP PANEL BY RT-PCR (FLU A&B, COVID) ARPGX2  ?BRAIN NATRIURETIC PEPTIDE  ?APTT  ?PROTIME-INR  ? ? ? ?EKG: ? EKG Interpretation ? ?Date/Time:  Saturday March 09 2022 00:04:18 EDT ?Ventricular Rate:  78 ?PR Interval:  160 ?QRS Duration: 74 ?QT Interval:  356 ?QTC Calculation: 405 ?R Axis:   8 ?Text Interpretation: Normal sinus rhythm Low voltage QRS Cannot rule out Anterior infarct , age undetermined Abnormal ECG When compared with ECG of 27-Feb-2022 04:59, PREVIOUS  ECG IS PRESENT Confirmed by Pryor Curia [phone]) on 03/09/2022 3:56:02 AM ?  ? ?  ? ? ? ?RADIOLOGY: ?My personal review and interpretation of imaging: Chest x-ray shows a mass in the left hilum.  CTA of th

## 2022-03-09 NOTE — Assessment & Plan Note (Addendum)
Continue statin. 

## 2022-03-09 NOTE — Assessment & Plan Note (Addendum)
Presented with wheezing and increased O2 requirement.  Started on IV steroids on admission, transitioned to prednisone. ?--Continue prednisone & taper by 5 mg daily until zero, per Dr. Loni Muse pulm ?--5 days Zithromax 250 mg ?--Duonebs and PRN albuterol nebs ?--Antitussives, mucolytics ?--Pulmonary hygiene ?--Supplement O2 PRN per protocol ?

## 2022-03-09 NOTE — Assessment & Plan Note (Deleted)
Suspect prerenal from acute CHF with secondary renal hypoperfusion. ?Renal function improving with diuresis. ?Monitor BMP. ?Avoid nephrotoxins, hypotension ?Maintain MAP>65 ?Renally dose meds ?

## 2022-03-09 NOTE — ED Notes (Signed)
Attending team at bedside at this time. ?

## 2022-03-09 NOTE — Consult Note (Signed)
? ? ? ?PULMONOLOGY ? ? ? ? ? ? ? ? ?Date: 03/09/2022,   ?MRN# [id number] Edwin Donovan. [birth year] ? ? ?  ?AdmissionWeight: (!) 141.5 kg                 ?CurrentWeight: (!) 141.5 kg ? ?Referring provider: Dr. Arbutus Ped ? ? ?CHIEF COMPLAINT:  ? ?Nonmassive PE with RUL nodule and mediastinal lymphadenopathy ? ? ?HISTORY OF PRESENT ILLNESS  ? ?This is a pleasant 76 year old male with a history of prostate CA, disorder, arthritis, CHF, CKD COPD, dysrhythmia history of nephrolithiasis and essential previous episode of pneumonia in 2017, he reports worsening cough and wheezing.  He was found to be mildly tachypneic on arrival and required 3 L of supplemental oxygen to reach normoxia at SPO2 95%.  Labs showed CKD however troponins were essentially normal in the context of CKD with only mild elevation.  Influenza and COVID-19 are negative.  CTA was performed with findings of subsegmental pulmonary emboli as well as 17 mm spiculated nodule of the right upper lobe and associated mediastinal lymphadenopathy/mass.  He had nephrectomy in the past due to renal cell carcinoma.  He quit smoking 2 years ago. PCCM consultation was placed for additional evaluation and management.  ? ? ?PAST MEDICAL HISTORY  ? ?Past Medical History:  ?Diagnosis Date  ? Anxiety   ? ptsd  ? Arthritis   ? Cancer [facility])   ? PROSTATE  ? CHF (congestive heart failure) ([place])   ? Chronic kidney disease   ? stones  ? COPD (chronic obstructive pulmonary disease) ([place])   ? Dysrhythmia   ? History of kidney stones   ? Hypertension   ? Kidney stone   ? Pneumonia   ? 06/27/16  ? Shortness of breath dyspnea   ? ? ? ?SURGICAL HISTORY  ? ?Past Surgical History:  ?Procedure Laterality Date  ? CYSTOSCOPY W/ RETROGRADES Bilateral 12/26/2017  ? Procedure: CYSTOSCOPY WITH RETROGRADE PYELOGRAM;  Surgeon: Abbie Sons, MD;  Location: [facility];  Service: Urology;  Laterality: Bilateral;  ? CYSTOSCOPY W/ RETROGRADES Right 02/09/2019  ? Procedure: CYSTOSCOPY WITH RETROGRADE  PYELOGRAM;  Surgeon: Abbie Sons, MD;  Location: [facility];  Service: Urology;  Laterality: Right;  ? CYSTOSCOPY W/ URETERAL STENT PLACEMENT Right 07/29/2016  ? Procedure: CYSTOSCOPY WITH STENT REPLACEMENT;  Surgeon: Hollice Espy, MD;  Location: [facility];  Service: Urology;  Laterality: Right;  ? CYSTOSCOPY WITH BIOPSY Right 07/29/2016  ? Procedure: CYSTOSCOPY WITH BIOPSY;  Surgeon: Hollice Espy, MD;  Location: [facility];  Service: Urology;  Laterality: Right;  ? CYSTOSCOPY WITH BIOPSY Right 02/09/2019  ? Procedure: CYSTOSCOPY WITH BIOPSY;  Surgeon: Abbie Sons, MD;  Location: [facility];  Service: Urology;  Laterality: Right;  ? CYSTOSCOPY WITH STENT PLACEMENT Right 06/28/2016  ? Procedure: CYSTOSCOPY WITH STENT PLACEMENT;  Surgeon: Hollice Espy, MD;  Location: [facility];  Service: Urology;  Laterality: Right;  ? CYSTOSCOPY WITH STENT PLACEMENT Right 02/09/2019  ? Procedure: CYSTOSCOPY WITH STENT PLACEMENT;  Surgeon: Abbie Sons, MD;  Location: [facility];  Service: Urology;  Laterality: Right;  ? CYSTOSCOPY/URETEROSCOPY/HOLMIUM LASER/STENT PLACEMENT Right 12/26/2017  ? Procedure: CYSTOSCOPY/URETEROSCOPY/HOLMIUM LASER/STENT PLACEMENT;  Surgeon: Abbie Sons, MD;  Location: [facility];  Service: Urology;  Laterality: Right;  ? EXTRACORPOREAL SHOCK WAVE LITHOTRIPSY Right 12/04/2017  ? Procedure: EXTRACORPOREAL SHOCK WAVE LITHOTRIPSY (ESWL);  Surgeon: Hollice Espy, MD;  Location: [facility];  Service: Urology;  Laterality: Right;  ? PROSTATECTOMY    ? URETEROSCOPY  Right 07/29/2016  ? Procedure: URETEROSCOPY;  Surgeon: Hollice Espy, MD;  Location: [facility];  Service: Urology;  Laterality: Right;  ? URETEROSCOPY Right 02/09/2019  ? Procedure: URETEROSCOPY- DIAGNOSTIC;  Surgeon: Abbie Sons, MD;  Location: [facility];  Service: Urology;  Laterality: Right;  ? ? ? ?FAMILY HISTORY  ? ?Family History  ?Problem Relation Age of Onset  ? Stroke Mother   ? Heart attack Father   ? Lung cancer Father   ? Kidney  disease Father   ? Prostate cancer Neg Hx   ? Bladder Cancer Neg Hx   ? ? ? ?SOCIAL HISTORY  ? ?Social History  ? ?Tobacco Use  ? Smoking status: Some Days  ?  Packs/day: 0.50  ?  Types: Cigarettes  ? Smokeless tobacco: Never  ?Vaping Use  ? Vaping Use: Never used  ?Substance Use Topics  ? Alcohol use: Yes  ?  Alcohol/week: 14.0 standard drinks  ?  Types: 14 Cans of beer per week  ? Drug use: Yes  ?  Types: Marijuana  ?  Comment: "sometimes, not very often"  ? ? ? ?MEDICATIONS  ? ? ?Home Medication:  ?Current Outpatient Rx  ? Order #: [id number] Class: Historical Med  ? Order #: [id number] Class: Historical Med  ? Order #: [id number] Class: Historical Med  ? Order #: [id number] Class: Historical Med  ? Order #: [id number] Class: Historical Med  ? Order #: [id number] Class: Historical Med  ? Order #: [id number] Class: Historical Med  ? Order #: [id number] Class: Historical Med  ? Order #: [id number] Class: Historical Med  ? Order #: [id number] Class: Normal  ?  ?Current Medication: ? ?Current Facility-Administered Medications:  ?  acetaminophen (TYLENOL) tablet 650 mg, 650 mg, Oral, Q6H PRN **OR** acetaminophen (TYLENOL) suppository 650 mg, 650 mg, Rectal, Q6H PRN, Mansy, Jan A, MD ?  atorvastatin (LIPITOR) tablet 80 mg, 80 mg, Oral, Daily, Mansy, Jan A, MD, 80 mg at 03/09/22 7062 ?  benzonatate (TESSALON) capsule 200 mg, 200 mg, Oral, TID, Mansy, Jan A, MD, 200 mg at 03/09/22 3762 ?  furosemide (LASIX) injection 40 mg, 40 mg, Intravenous, Q12H, Mansy, Jan A, MD ?  heparin ADULT infusion 100 units/mL (25000 units/268mL), 1,700 Units/hr, Intravenous, Continuous, Belue, Alver Sorrow, RPH, Last Rate: 17 mL/hr at 03/09/22 0427, 1,700 Units/hr at 03/09/22 0427 ?  loratadine (CLARITIN) tablet 10 mg, 10 mg, Oral, Daily, 10 mg at 03/09/22 0955 **AND** pseudoephedrine (SUDAFED) 12 hr tablet 120 mg, 120 mg, Oral, BID, Mansy, Jan A, MD, 120 mg at 03/09/22 0955 ?  methylPREDNISolone sodium succinate (SOLU-MEDROL) 40 mg/mL injection 40 mg, 40 mg,  Intravenous, Q12H **FOLLOWED BY** [START ON 03/10/2022] predniSONE (DELTASONE) tablet 40 mg, 40 mg, Oral, Q breakfast, Mansy, Jan A, MD ?  metoprolol succinate (TOPROL-XL) 24 hr tablet 100 mg, 100 mg, Oral, Daily, Mansy, Jan A, MD, 100 mg at 03/09/22 0954 ?  traZODone (DESYREL) tablet 50 mg, 50 mg, Oral, QHS PRN, Mansy, Arvella Merles, MD ? ?Current Outpatient Medications:  ?  albuterol (PROVENTIL HFA;VENTOLIN HFA) 108 (90 Base) MCG/ACT inhaler, Inhale 1-2 puffs into the lungs every 6 (six) hours as needed for wheezing or shortness of breath., Disp: , Rfl:  ?  atorvastatin (LIPITOR) 80 MG tablet, TAKE ONE-HALF TABLET BY MOUTH AT BEDTIME FOR CHOLESTEROL, Disp: , Rfl:  ?  budesonide-formoterol (SYMBICORT) 160-4.5 MCG/ACT inhaler, Inhale 2 puffs into the lungs 2 (two) times daily., Disp: , Rfl:  ?  cetirizine (ZYRTEC) 10 MG tablet, Take 1 tablet by mouth daily., Disp: , Rfl:  ?  furosemide (LASIX) 40 MG tablet, TAKE ONE TABLET BY MOUTH EVERY DAY FOR ACCUMULATION OF FLUID, Disp: , Rfl:  ?  lisinopril (PRINIVIL,ZESTRIL) 40 MG tablet, Take 40 mg by mouth daily., Disp: , Rfl:  ?  metoprolol succinate (TOPROL-XL) 100 MG 24 hr tablet, TAKE ONE TABLET BY MOUTH EVERY DAY FOR HEART AND BLOOD PRESSURE, Disp: , Rfl:  ?  polyethylene glycol powder (GLYCOLAX/MIRALAX) 17 GM/SCOOP powder, MIX 17GM (1 CAPFUL) BY MOUTH EVERY DAY AS NEEDED MIX 17 GRAMS (USE BOTTLE CAP) IN LIQUID OF CHOICE AS DIRECTED FOR CONSTIPATION, Disp: , Rfl:  ?  traZODone (DESYREL) 50 MG tablet, Take 1 tablet by mouth at bedtime as needed., Disp: , Rfl:  ?  benzonatate (TESSALON) 200 MG capsule, Take 1 capsule (200 mg total) by mouth 3 (three) times daily., Disp: 20 capsule, Rfl: 0 ? ? ? ?ALLERGIES  ? ?Patient has no known allergies. ? ? ? ? ?REVIEW OF SYSTEMS  ? ? ?Review of Systems: ? ?Gen:  Denies  fever, sweats, chills weigh loss  ?HEENT: Denies blurred vision, double vision, ear pain, eye pain, hearing loss, nose bleeds, sore throat ?Cardiac:  No dizziness, chest pain  or heaviness, chest tightness,edema ?Resp:   reports dyspnea chronically  ?Gi: Denies swallowing difficulty, stomach pain, nausea or vomiting, diarrhea, constipation, bowel incontinence ?Gu:  Denies bladder incon

## 2022-03-09 NOTE — Assessment & Plan Note (Addendum)
Echo this admission on 4/22 technically limited study with preserved EF, but diastolic parameters indeterminate. ?Started on IV Lasix BID on admission. ?4/25: output yesterday 3700 mL, LE edema resolved, renal fx plateau ?Net IO Since Admission: -7,193.85 mL [03/12/22 1701] ?--Cardiology following ?--Transition IV lasix 40 mg BID >> PO Lasix 40 mg ID ?--Toprol-XL 100 mg -- prescribe at d/c ?--Substituting Coreg for Toprol due to BP's elevated with ACEI on hold (AKI).  Can resume metop once ACEI resumed.   ?--Follow renal function, electrolytes ?--Strict I/O's, daily weights ? ?

## 2022-03-09 NOTE — Assessment & Plan Note (Addendum)
Continue heparin and transition to Eliquis after any procedures. ?Likely provoked by underlying malignancy. ?

## 2022-03-09 NOTE — Progress Notes (Signed)
*  PRELIMINARY RESULTS* ?Echocardiogram ?2D Echocardiogram has been performed. Definity IV imaging agent used on this study. ?

## 2022-03-09 NOTE — Hospital Course (Addendum)
Edwin Donovan. is a 76 y.o. male with past medical history COPD not on home oxygen prior to recent hospital d/c on 6/31/49, chronic diastolic CHF, hypertension and CKD stage II with hx or Right nephrectomy.  He presented to the ED  with progressively worsening lower extremity edema, significant orthopnea, dyspnea worsening on exertion over the last couple weeks, in addition to worsening cough with wheezing.   ? ?Chest CTA  - two isolated subsegmental pulmonary emboli at the bifurcation in the right upper lobe pulmonary artery. Overall clot burden is very small.    ?17 mm spiculated nodule in the posterior right upper lobe.  Associated 10.0 x 7.4 cm left mediastinal mass extending into the medial left upper lobe and left perihilar region. This appearance raises concern for primary bronchogenic neoplasm such as small cell lung cancer. ?Patient developed acute respiratory failure requiring BiPAP and high flow oxygen.  This is mainly due to lung cancer ? ?Oncology decided that patient will complete radiation therapy while in the hospital. ? ?Repeated chest x-ray on 5/5 showed complete widening of left lung, ultrasound did not show significant fluids.  This is a secondary to atelectasis with potential obstruction of airway from cancer.  Continue radiation therapy ? ?

## 2022-03-09 NOTE — ED Notes (Signed)
Pt given breakfast tray. Pt denies any other needs at this time. Call bell in reach. VSS.  ?

## 2022-03-09 NOTE — Progress Notes (Signed)
?  Progress Note ? ? ?Patient: Edwin Donovan. WJX:[id number] DOB: [birth year] DOA: 03/09/2022     0 ?DOS: the patient was seen and examined on 03/09/2022 ?  ?Brief hospital course: ?Rondell "Rush [facility]mark" Shaheim Mahar. is a 76 y.o. male with past medical history COPD not on home oxygen prior to recent hospital d/c on 12/31/06, chronic diastolic CHF, hypertension and CKD stage II with hx or Right nephrectomy.  He presented to the ED early AM on 03/09/2022 with progressively worsening lower extremity edema, significant orthopnea, dyspnea worsening on exertion over the last couple weeks, in addition to worsening cough with wheezing.   ? ?ED Course -- RR 22, spO2 95% on 3 L/min [place] O2, otherwise stable vitals.  Labs showed AKI, mild anemia, mild thrombocytopenia, minimally elevated hs-troponin which trended flat. ? ?Imaging -- ?Chest x-ray - large masslike opacity in the left hilum. ?Chest CTA  - two isolated subsegmental pulmonary emboli at the bifurcation in the right upper lobe pulmonary artery. Overall clot burden is very small.    ?17 mm spiculated nodule in the posterior right upper lobe.  Associated 10.0 x 7.4 cm left mediastinal mass extending into the medial left upper lobe and left perihilar region. This appearance raises concern for primary bronchogenic neoplasm such as small cell lung cancer. ? ?Admitted to hospitalist service with pulmonology, oncology and palliative care consulted.  ? ?Same Day as Admission Rounding Note. ?Please see H&P for full assessment and plan, with any changes or additions as outlined below. ? ? ?Assessment and Plan: ? ?--Consult cardiology for surgical clearance, request by pulmonology as bronchoscopy/EBUS for biopsy is needed for tissue diagnosis ? ?--Palliative care consulted for Monday ? ?--Case discussed with Dr. Janese Banks of oncology ? ?--Continue IV Lasix, IV steroids ? ?--Case discussed at bedside today with Dr. Lanney Gins. ? ?--Patient wants to be full code for now.   ?We started goals of  care and code status discussions today. ? ? ? ?  ? ?Subjective: Pt seen in the ED holding for a bed, Dr. Lanney Gins also at bedside consulting.  Pt feels a bit better with treatment so far.  He understands he may have lung cancer.  States he wants to live as long as he can, not ready to die yet, so wants to be full code.   ? ?Aside from his swelling and shortness of breath, no other acute complaints. ? ? ?Physical Exam: ?Vitals:  ? 03/09/22 1500 03/09/22 1515 03/09/22 1600 03/09/22 1633  ?BP: 128/78  (!) 138/91   ?Pulse: 89  88   ?Resp: 17  (!) 24   ?Temp:    (!) 97.5 ?F (36.4 ?C)  ?TempSrc:    Oral  ?SpO2:  97% 98%   ?Weight:      ?Height:      ? ?General exam: awake, alert, no acute distress, chronically ill-appearing, obese ?HEENT: beard, moist mucus membranes, hearing grossly normal  ?Respiratory system: bibasilar crackles and expiratory wheezes, rales or rhonchi, normal respiratory effort. ?Cardiovascular system: normal S1/S2, RRR, 3+ pitting lower extremity edema bilaterally.   ?Gastrointestinal system: soft, NT, ND, no HSM felt, +bowel sounds. ?Central nervous system: A&O x4. no gross focal neurologic deficits, normal speech ?Extremities: unna boot on RLE, BLE ptting edema, normal tone ?Skin: dry, intact, normal temperature ?Psychiatry: normal mood, congruent affect, judgement and insight appear normal ? ? ? ?No Charge ? ? ?Author: ?Ezekiel Slocumb, DO ?03/09/2022 4:51 PM ? ?For on call review [email or web address].  ?

## 2022-03-09 NOTE — Assessment & Plan Note (Addendum)
Using Coreg in place of Toprol-XL (while ACEI held due to AKI, BP's elevated).   ?Zestril held due to AKI.   ?Resume Zestril with renal function improves. ?Adjust antihypertensives PRN. ?

## 2022-03-09 NOTE — H&P (Signed)
Home ?  ?  ?[place] ? ? ?PATIENT NAME: Edwin Donovan   ? ?MR#:  [id number] ? ?DATE OF BIRTH:  [birth year] ? ?DATE OF ADMISSION:  03/09/2022 ? ?PRIMARY CARE PHYSICIAN: System, Provider Not In  ? ?Patient is coming from: Home ? ?REQUESTING/REFERRING PHYSICIAN: Ward, Delice Bison, DO ? ?CHIEF COMPLAINT:  ? ?Chief Complaint  ?Patient presents with  ?? Shortness of Breath  ? ? ?HISTORY OF PRESENT ILLNESS:  ?Edwin Donovan. is a 76 y.o. male with medical history significant for COPD, CHF, hypertension and CKD, who presented to the ER with acute onset of worsening lower extremity edema with associated orthopnea and dyspnea worsening on exertion over the last couple weeks.  He admitted to worsening cough as well as wheezing.  His symptoms have been getting significantly worse over the last couple of days.  No fever or chills.  No nausea or vomiting or abdominal pain.  He denies any chest pain or palpitations.  No dysuria, oliguria or hematuria or flank pain.  No bleeding diathesis. ? ?ED Course: Upon presentation to the ER, respiratory it was 22 and pulse currently 95% on 3 L of O2 by nasal cannula with otherwise normal vital signs.  Labs revealed a BUN of 55 and creatinine 1.62 and high-sensitivity troponin was 30 and later 32.  CBC showed mild anemia and thrombocytopenia.  Influenza antigens and COVID-19 PCR came back negative.  Coagulation profile was within normal. ?EKG as reviewed by me : Sinus rhythm with a rate of 78 with low voltage QRS ?Imaging: For which x-ray showed large masslike opacity in the left hilum. ?Chest CTA revealed the following: ?Two isolated subsegmental pulmonary emboli at the bifurcation in the ?right upper lobe pulmonary artery. Overall clot burden is very ?small. ?  ?17 mm spiculated nodule in the posterior right upper lobe. ?Associated 10.0 x 7.4 cm left mediastinal mass extending into the ?medial left upper lobe and left perihilar region. This appearance ?raises concern for primary  bronchogenic neoplasm such as small cell ?lung cancer. ?  ?Critical Value/emergent results were called by telephone at the time ?of interpretation on 03/09/2022 at 2:46 am to provider Dr Cyril Mourning ?WARD, who verbally acknowledged these results. ?  ?Aortic Atherosclerosis (ICD10-I70.0) and Emphysema (ICD10-J43.9). ? ?The patient was given DuoNebs, IV Solu-Medrol and was started on IV heparin with bolus and drip.  He is admitted to a progressive unit bed for further evaluation and management. ? ?PAST MEDICAL HISTORY:  ? ?Past Medical History:  ?Diagnosis Date  ?? Anxiety   ? ptsd  ?? Arthritis   ?? Cancer [facility])   ? PROSTATE  ?? CHF (congestive heart failure) ([place])   ?? Chronic kidney disease   ? stones  ?? COPD (chronic obstructive pulmonary disease) ([place])   ?? Dysrhythmia   ?? History of kidney stones   ?? Hypertension   ?? Kidney stone   ?? Pneumonia   ? 06/27/16  ?? Shortness of breath dyspnea   ? ? ?PAST SURGICAL HISTORY:  ? ?Past Surgical History:  ?Procedure Laterality Date  ?? CYSTOSCOPY W/ RETROGRADES Bilateral 12/26/2017  ? Procedure: CYSTOSCOPY WITH RETROGRADE PYELOGRAM;  Surgeon: Abbie Sons, MD;  Location: [facility];  Service: Urology;  Laterality: Bilateral;  ?? CYSTOSCOPY W/ RETROGRADES Right 02/09/2019  ? Procedure: CYSTOSCOPY WITH RETROGRADE PYELOGRAM;  Surgeon: Abbie Sons, MD;  Location: [facility];  Service: Urology;  Laterality: Right;  ?? CYSTOSCOPY W/ URETERAL STENT PLACEMENT Right 07/29/2016  ? Procedure: CYSTOSCOPY WITH STENT  REPLACEMENT;  Surgeon: Hollice Espy, MD;  Location: [facility];  Service: Urology;  Laterality: Right;  ?? CYSTOSCOPY WITH BIOPSY Right 07/29/2016  ? Procedure: CYSTOSCOPY WITH BIOPSY;  Surgeon: Hollice Espy, MD;  Location: [facility];  Service: Urology;  Laterality: Right;  ?? CYSTOSCOPY WITH BIOPSY Right 02/09/2019  ? Procedure: CYSTOSCOPY WITH BIOPSY;  Surgeon: Abbie Sons, MD;  Location: [facility];  Service: Urology;  Laterality: Right;  ?? CYSTOSCOPY WITH STENT  PLACEMENT Right 06/28/2016  ? Procedure: CYSTOSCOPY WITH STENT PLACEMENT;  Surgeon: Hollice Espy, MD;  Location: [facility];  Service: Urology;  Laterality: Right;  ?? CYSTOSCOPY WITH STENT PLACEMENT Right 02/09/2019  ? Procedure: CYSTOSCOPY WITH STENT PLACEMENT;  Surgeon: Abbie Sons, MD;  Location: [facility];  Service: Urology;  Laterality: Right;  ?? CYSTOSCOPY/URETEROSCOPY/HOLMIUM LASER/STENT PLACEMENT Right 12/26/2017  ? Procedure: CYSTOSCOPY/URETEROSCOPY/HOLMIUM LASER/STENT PLACEMENT;  Surgeon: Abbie Sons, MD;  Location: [facility];  Service: Urology;  Laterality: Right;  ?? EXTRACORPOREAL SHOCK WAVE LITHOTRIPSY Right 12/04/2017  ? Procedure: EXTRACORPOREAL SHOCK WAVE LITHOTRIPSY (ESWL);  Surgeon: Hollice Espy, MD;  Location: [facility];  Service: Urology;  Laterality: Right;  ?? PROSTATECTOMY    ?? URETEROSCOPY Right 07/29/2016  ? Procedure: URETEROSCOPY;  Surgeon: Hollice Espy, MD;  Location: [facility];  Service: Urology;  Laterality: Right;  ?? URETEROSCOPY Right 02/09/2019  ? Procedure: URETEROSCOPY- DIAGNOSTIC;  Surgeon: Abbie Sons, MD;  Location: [facility];  Service: Urology;  Laterality: Right;  ? ? ?SOCIAL HISTORY:  ? ?Social History  ? ?Tobacco Use  ?? Smoking status: Some Days  ?  Packs/day: 0.50  ?  Types: Cigarettes  ?? Smokeless tobacco: Never  ?Substance Use Topics  ?? Alcohol use: Yes  ?  Alcohol/week: 14.0 standard drinks  ?  Types: 14 Cans of beer per week  ? ? ?FAMILY HISTORY:  ? ?Family History  ?Problem Relation Age of Onset  ?? Stroke Mother   ?? Heart attack Father   ?? Lung cancer Father   ?? Kidney disease Father   ?? Prostate cancer Neg Hx   ?? Bladder Cancer Neg Hx   ? ? ?DRUG ALLERGIES:  ?No Known Allergies ? ?REVIEW OF SYSTEMS:  ? ?ROS ?As per history of present illness. All pertinent systems were reviewed above. Constitutional, HEENT, cardiovascular, respiratory, GI, GU, musculoskeletal, neuro, psychiatric, endocrine, integumentary and hematologic systems were reviewed and  are otherwise negative/unremarkable except for positive findings mentioned above in the HPI. ? ? ?MEDICATIONS AT HOME:  ? ?Prior to Admission medications   ?Medication Sig Start Date End Date Taking? Authorizing Provider  ?albuterol (PROVENTIL HFA;VENTOLIN HFA) 108 (90 Base) MCG/ACT inhaler Inhale 1-2 puffs into the lungs every 6 (six) hours as needed for wheezing or shortness of breath.   Yes [provider]  ?atorvastatin (LIPITOR) 80 MG tablet TAKE ONE-HALF TABLET BY MOUTH AT BEDTIME FOR CHOLESTEROL 06/22/21  Yes [provider]  ?budesonide-formoterol (SYMBICORT) 160-4.5 MCG/ACT inhaler Inhale 2 puffs into the lungs 2 (two) times daily.   Yes [provider]  ?cetirizine (ZYRTEC) 10 MG tablet Take 1 tablet by mouth daily. 11/30/21  Yes [provider]  ?furosemide (LASIX) 40 MG tablet TAKE ONE TABLET BY MOUTH EVERY DAY FOR ACCUMULATION OF FLUID 01/03/22  Yes [provider]  ?lisinopril (PRINIVIL,ZESTRIL) 40 MG tablet Take 40 mg by mouth daily.   Yes [provider]  ?metoprolol succinate (TOPROL-XL) 100 MG 24 hr tablet TAKE ONE TABLET BY MOUTH EVERY DAY FOR HEART AND BLOOD PRESSURE 01/03/22  Yes [provider]  ?polyethylene glycol powder (GLYCOLAX/MIRALAX) 17 GM/SCOOP powder MIX 17GM (1 CAPFUL) BY MOUTH EVERY DAY AS NEEDED MIX 17 GRAMS (USE BOTTLE CAP) IN LIQUID OF CHOICE AS DIRECTED FOR CONSTIPATION 11/30/21  Yes [provider]  ?traZODone (DESYREL) 50 MG tablet Take 1 tablet by mouth at bedtime as needed. 08/15/21  Yes [provider]  ?benzonatate (TESSALON) 200 MG capsule Take 1 capsule (200 mg total) by mouth 3 (three) times daily. 03/01/22   Shawna Clamp, MD  ? ?  ? ?VITAL SIGNS:  ?Blood pressure (!) 141/77, pulse 94, temperature 98.2 ?F (36.8 ?C), temperature source Oral, resp. rate 18, height 5\' 11"  (1.803 m), weight (!) 141.5 kg, SpO2 92 %. ? ?PHYSICAL EXAMINATION:  ?Physical Exam ? ?GENERAL:  76 y.o.-year-old Caucasian male  patient lying in the bed with mild respiratory distress with conversational dyspnea   ?EYES: Pupils equal, round, reactive to light and accommodation. No scleral icterus. Extraocular muscles intact.  ?HEENT: H

## 2022-03-09 NOTE — Assessment & Plan Note (Addendum)
17 mm spiculated nodule in the posterior right upper lobe. ?Associated 10.0 x 7.4 cm left mediastinal mass extending into the ?medial left upper lobe and left perihilar region. This appearance ?raises concern for primary bronchogenic neoplasm such as small cell ?lung cancer. ?-Pulmonary, Oncology, Palliative Care are consulted ?--Cardiology cleared for bronchoscopy to biopsy ?--Bronchoscopy EBUS planned Friday ?

## 2022-03-09 NOTE — Progress Notes (Signed)
ANTICOAGULATION CONSULT NOTE ? ?Pharmacy Consult for heparin infusion ?Indication: pulmonary embolus ? ?No Known Allergies ? ?Patient Measurements: ?Height: 5\' 11"  (180.3 cm) ?Weight: (!) 141.5 kg (312 lb) ?IBW/kg (Calculated) : 75.3 ?Heparin Dosing Weight: 108.3 kg ? ?Vital Signs: ?Temp: 98.2 ?F (36.8 ?C) (04/21 2355) ?Temp Source: Oral (04/21 2355) ?BP: 145/86 (04/22 0305) ?Pulse Rate: 80 (04/22 0305) ? ?Labs: ?Recent Labs  ?  03/09/22 ?0003 03/09/22 ?0201  ?HGB 11.7*  --   ?HCT 38.5*  --   ?PLT 140*  --   ?CREATININE 1.62*  --   ?TROPONINIHS 30* 32*  ? ? ?Estimated Creatinine Clearance: 55 mL/min (A) (by C-G formula based on SCr of 1.62 mg/dL (H)). ? ? ?Medical History: ?Past Medical History:  ?Diagnosis Date  ? Anxiety   ? ptsd  ? Arthritis   ? Cancer [facility])   ? PROSTATE  ? CHF (congestive heart failure) ([place])   ? Chronic kidney disease   ? stones  ? COPD (chronic obstructive pulmonary disease) ([place])   ? Dysrhythmia   ? History of kidney stones   ? Hypertension   ? Kidney stone   ? Pneumonia   ? 06/27/16  ? Shortness of breath dyspnea   ? ? ?Assessment: ?Pt is 76 yo male presenting to ED w/ SOB and leg edema found with "two isolated subsegmental pulmonary emboli at the bifurcation in the ?right upper lobe pulmonary artery." ? ?Goal of Therapy:  ?Heparin level 0.3-0.7 units/ml ?Monitor platelets by anticoagulation protocol: Yes ?  ?Plan:  ?Bolus 6500 units x 1 ?Start heparin infusion at 1700 units/hr ?Will check HL in 8 hr after start of infusion ?CBC daily while on heparin ? ?Renda Rolls, PharmD, MBA ?03/09/2022 ?3:11 AM ? ? ? ?

## 2022-03-09 NOTE — Progress Notes (Signed)
ANTICOAGULATION CONSULT NOTE ? ?Pharmacy Consult for heparin infusion ?Indication: pulmonary embolus ? ?No Known Allergies ? ?Patient Measurements: ?Height: 5\' 11"  (180.3 cm) ?Weight: 132.7 kg (292 lb 9.6 oz) ?IBW/kg (Calculated) : 75.3 ?Heparin Dosing Weight: 108.3 kg ? ?Vital Signs: ?Temp: 97.5 ?F (36.4 ?C) (04/22 2034) ?Temp Source: Oral (04/22 1658) ?BP: 133/93 (04/22 2034) ?Pulse Rate: 82 (04/22 2034) ? ?Labs: ?Recent Labs  ?  03/09/22 ?0003 03/09/22 ?0201 03/09/22 ?0305 03/09/22 ?1258 03/09/22 ?2118  ?HGB 11.7*  --   --   --   --   ?HCT 38.5*  --   --   --   --   ?PLT 140*  --   --   --   --   ?APTT  --   --  35  --   --   ?LABPROT  --   --  13.9  --   --   ?INR  --   --  1.1  --   --   ?HEPARINUNFRC  --   --   --  0.52 0.66  ?CREATININE 1.62*  --   --   --   --   ?TROPONINIHS 30* 32*  --   --   --   ? ? ? ?Estimated Creatinine Clearance: 53.1 mL/min (A) (by C-G formula based on SCr of 1.62 mg/dL (H)). ? ? ?Medical History: ?Past Medical History:  ?Diagnosis Date  ? Anxiety   ? ptsd  ? Arthritis   ? Cancer [facility])   ? PROSTATE  ? CHF (congestive heart failure) ([place])   ? Chronic kidney disease   ? stones  ? COPD (chronic obstructive pulmonary disease) ([place])   ? Dysrhythmia   ? History of kidney stones   ? Hypertension   ? Kidney stone   ? Pneumonia   ? 06/27/16  ? Shortness of breath dyspnea   ? ? ?Assessment: ?Pt is 76 yo male presenting to ED w/ SOB and leg edema found with "two isolated subsegmental pulmonary emboli at the bifurcation in the right upper lobe pulmonary artery." ? ?4/22 1258 HL 0.52 ?4/22 2118 HL 0.66 ? ?Goal of Therapy:  ?Heparin level 0.3-0.7 units/ml ?Monitor platelets by anticoagulation protocol: Yes ?  ?Plan:  ?Heparin level remains therapeutic. Will continue heparin infusion at 1700 units/hr. Recheck heparin daily with AM labs while therapeutic. CBC daily while on heparin.  ? ?Renda Rolls, PharmD, MBA ?03/09/2022 ?9:54 PM ? ? ? ? ?

## 2022-03-10 ENCOUNTER — Inpatient Hospital Stay: Payer: Medicare Other

## 2022-03-10 DIAGNOSIS — J441 Chronic obstructive pulmonary disease with (acute) exacerbation: Secondary | ICD-10-CM | POA: Diagnosis not present

## 2022-03-10 DIAGNOSIS — I10 Essential (primary) hypertension: Secondary | ICD-10-CM | POA: Diagnosis not present

## 2022-03-10 DIAGNOSIS — Z85528 Personal history of other malignant neoplasm of kidney: Secondary | ICD-10-CM

## 2022-03-10 DIAGNOSIS — Z01810 Encounter for preprocedural cardiovascular examination: Secondary | ICD-10-CM | POA: Diagnosis not present

## 2022-03-10 DIAGNOSIS — J9859 Other diseases of mediastinum, not elsewhere classified: Secondary | ICD-10-CM | POA: Diagnosis not present

## 2022-03-10 DIAGNOSIS — N179 Acute kidney failure, unspecified: Secondary | ICD-10-CM | POA: Diagnosis not present

## 2022-03-10 DIAGNOSIS — L930 Discoid lupus erythematosus: Secondary | ICD-10-CM | POA: Diagnosis present

## 2022-03-10 DIAGNOSIS — I5033 Acute on chronic diastolic (congestive) heart failure: Secondary | ICD-10-CM | POA: Diagnosis not present

## 2022-03-10 DIAGNOSIS — I2699 Other pulmonary embolism without acute cor pulmonale: Secondary | ICD-10-CM | POA: Diagnosis not present

## 2022-03-10 DIAGNOSIS — C61 Malignant neoplasm of prostate: Secondary | ICD-10-CM | POA: Diagnosis present

## 2022-03-10 LAB — CBC
HCT: 36.2 % — ABNORMAL LOW (ref 39.0–52.0)
Hemoglobin: 11.4 g/dL — ABNORMAL LOW (ref 13.0–17.0)
MCH: 30.4 pg (ref 26.0–34.0)
MCHC: 31.5 g/dL (ref 30.0–36.0)
MCV: 96.5 fL (ref 80.0–100.0)
Platelets: 143 10*3/uL — ABNORMAL LOW (ref 150–400)
RBC: 3.75 MIL/uL — ABNORMAL LOW (ref 4.22–5.81)
RDW: 14.1 % (ref 11.5–15.5)
WBC: 9.7 10*3/uL (ref 4.0–10.5)
nRBC: 0 % (ref 0.0–0.2)

## 2022-03-10 LAB — BASIC METABOLIC PANEL
Anion gap: 7 (ref 5–15)
BUN: 54 mg/dL — ABNORMAL HIGH (ref 8–23)
CO2: 30 mmol/L (ref 22–32)
Calcium: 9.1 mg/dL (ref 8.9–10.3)
Chloride: 102 mmol/L (ref 98–111)
Creatinine, Ser: 1.54 mg/dL — ABNORMAL HIGH (ref 0.61–1.24)
GFR, Estimated: 46 mL/min — ABNORMAL LOW (ref >60)
Glucose, Bld: 160 mg/dL — ABNORMAL HIGH (ref 70–99)
Potassium: 4.7 mmol/L (ref 3.5–5.1)
Sodium: 139 mmol/L (ref 135–145)

## 2022-03-10 LAB — HEPARIN LEVEL (UNFRACTIONATED)
Heparin Unfractionated: 0.72 IU/mL — ABNORMAL HIGH (ref 0.30–0.70)
Heparin Unfractionated: 0.69 IU/mL (ref 0.30–0.70)
Heparin Unfractionated: 0.65 IU/mL (ref 0.30–0.70)

## 2022-03-10 LAB — PROCALCITONIN: Procalcitonin: <0.1 ng/mL

## 2022-03-10 LAB — MAGNESIUM: Magnesium: 2.1 mg/dL (ref 1.7–2.4)

## 2022-03-10 LAB — C-REACTIVE PROTEIN: CRP: 5.9 mg/dL — ABNORMAL HIGH (ref <1.0)

## 2022-03-10 MED ORDER — GADOBUTROL 1 MMOL/ML IV SOLN
10.0000 mL | Freq: Once | INTRAVENOUS | Status: AC | PRN
  Administered 2022-03-10: 10 mL via INTRAVENOUS

## 2022-03-10 MED ORDER — ALBUTEROL SULFATE (2.5 MG/3ML) 0.083% IN NEBU
3.0000 mL | INHALATION_SOLUTION | RESPIRATORY_TRACT | Status: DC | PRN
  Administered 2022-03-10 – 2022-03-24 (×15): 3 mL via RESPIRATORY_TRACT
  Filled 2022-03-10 (×17): qty 3

## 2022-03-10 NOTE — Consult Note (Addendum)
?Cardiology Consultation:  ? ?Patient ID: Edwin Donovan. ?MRN: [id number]; DOB: Dec 03, 1945 ? ?Admit date: 03/09/2022 ?Date of Consult: 03/10/2022 ? ?PCP:  System, Provider Not In ?  ?[facility] Providers: Follows at [place] ? ? ?Patient Profile:  ? ?Edwin Donovan. is a 76 y.o. male with a hx of recent hospitalization for acute diastolic heart failure (discharged 03/01/22), COPD, prior tobacco use, hypertension, hyperlipidemia, chronic kidney disease stage 2, history of renal cell carcinoma s/p nephrectomy, history of prostate cancer s/p prostatectomy who is being seen 03/10/2022 for the evaluation of preprocedural cardiovascular risk at the request of Dr. Sidney Ace. ? ?History of Present Illness:  ? ?Edwin Donovan follows with the [place] primarily. He was recently admitted at [facility] (just discharged 03/01/22) for acute diastolic heart failure. EF at that time 60-65%. He was diuresed and discharged with home health. Of note, he was previously not on home O2 but required 3L at discharge. ? ?He noted worsening shortness of breath for several days prior to re-presentation. Noted that he needed to increase his home O2 to 5L. Noted increase in lower extremity edema as well. Workup in the ER noted flat hsTn. CTA noted 2 pulmonary emboli in the right upper lobe. Also noted suspicious lung and mediastinal lung masses. Pulmonology was consulted and recommended lung biopsy. Cardiology is asked to provide preprocedural evaluation. ? ?He has been largely sedentary for the last year. He had nephrectomy 2-3 years ago for his RCC. He did well with PT for a time but then relied more on his scooter, hospital bed, bedside commode, etc and now really does not do much more than move from bed to chair at home. He has no chest pain, has never been told he has had a heart attack or a stroke.  ? ?We reviewed the role of cardiovascular risk stratification. He is under the impression the biopsy will be done via bronchoscopy. ? ?Lower extremity  edema has been chronic for him. We discussed venous insufficiency today. ? ?Denies chest pain. No PND, orthopnea. No syncope or palpitations.  ? ?Past Medical History:  ?Diagnosis Date  ? Anxiety   ? ptsd  ? Arthritis   ? Cancer [facility])   ? PROSTATE  ? CHF (congestive heart failure) ([place])   ? Chronic kidney disease   ? stones  ? COPD (chronic obstructive pulmonary disease) ([place])   ? Dysrhythmia   ? History of kidney stones   ? Hypertension   ? Kidney stone   ? Pneumonia   ? 06/27/16  ? Shortness of breath dyspnea   ? ? ?Past Surgical History:  ?Procedure Laterality Date  ? CYSTOSCOPY W/ RETROGRADES Bilateral 12/26/2017  ? Procedure: CYSTOSCOPY WITH RETROGRADE PYELOGRAM;  Surgeon: Abbie Sons, MD;  Location: [facility];  Service: Urology;  Laterality: Bilateral;  ? CYSTOSCOPY W/ RETROGRADES Right 02/09/2019  ? Procedure: CYSTOSCOPY WITH RETROGRADE PYELOGRAM;  Surgeon: Abbie Sons, MD;  Location: [facility];  Service: Urology;  Laterality: Right;  ? CYSTOSCOPY W/ URETERAL STENT PLACEMENT Right 07/29/2016  ? Procedure: CYSTOSCOPY WITH STENT REPLACEMENT;  Surgeon: Hollice Espy, MD;  Location: [facility];  Service: Urology;  Laterality: Right;  ? CYSTOSCOPY WITH BIOPSY Right 07/29/2016  ? Procedure: CYSTOSCOPY WITH BIOPSY;  Surgeon: Hollice Espy, MD;  Location: [facility];  Service: Urology;  Laterality: Right;  ? CYSTOSCOPY WITH BIOPSY Right 02/09/2019  ? Procedure: CYSTOSCOPY WITH BIOPSY;  Surgeon: Abbie Sons, MD;  Location: [facility];  Service: Urology;  Laterality: Right;  ?  CYSTOSCOPY WITH STENT PLACEMENT Right 06/28/2016  ? Procedure: CYSTOSCOPY WITH STENT PLACEMENT;  Surgeon: Hollice Espy, MD;  Location: [facility];  Service: Urology;  Laterality: Right;  ? CYSTOSCOPY WITH STENT PLACEMENT Right 02/09/2019  ? Procedure: CYSTOSCOPY WITH STENT PLACEMENT;  Surgeon: Abbie Sons, MD;  Location: [facility];  Service: Urology;  Laterality: Right;  ? CYSTOSCOPY/URETEROSCOPY/HOLMIUM LASER/STENT PLACEMENT Right 12/26/2017   ? Procedure: CYSTOSCOPY/URETEROSCOPY/HOLMIUM LASER/STENT PLACEMENT;  Surgeon: Abbie Sons, MD;  Location: [facility];  Service: Urology;  Laterality: Right;  ? EXTRACORPOREAL SHOCK WAVE LITHOTRIPSY Right 12/04/2017  ? Procedure: EXTRACORPOREAL SHOCK WAVE LITHOTRIPSY (ESWL);  Surgeon: Hollice Espy, MD;  Location: [facility];  Service: Urology;  Laterality: Right;  ? PROSTATECTOMY    ? URETEROSCOPY Right 07/29/2016  ? Procedure: URETEROSCOPY;  Surgeon: Hollice Espy, MD;  Location: [facility];  Service: Urology;  Laterality: Right;  ? URETEROSCOPY Right 02/09/2019  ? Procedure: URETEROSCOPY- DIAGNOSTIC;  Surgeon: Abbie Sons, MD;  Location: [facility];  Service: Urology;  Laterality: Right;  ?  ? ?Home Medications:  ?Prior to Admission medications   ?Medication Sig Start Date End Date Taking? Authorizing Provider  ?albuterol (PROVENTIL HFA;VENTOLIN HFA) 108 (90 Base) MCG/ACT inhaler Inhale 1-2 puffs into the lungs every 6 (six) hours as needed for wheezing or shortness of breath.   Yes [provider]  ?atorvastatin (LIPITOR) 80 MG tablet TAKE ONE-HALF TABLET BY MOUTH AT BEDTIME FOR CHOLESTEROL 06/22/21  Yes [provider]  ?budesonide-formoterol (SYMBICORT) 160-4.5 MCG/ACT inhaler Inhale 2 puffs into the lungs 2 (two) times daily.   Yes [provider]  ?cetirizine (ZYRTEC) 10 MG tablet Take 1 tablet by mouth daily. 11/30/21  Yes [provider]  ?furosemide (LASIX) 40 MG tablet TAKE ONE TABLET BY MOUTH EVERY DAY FOR ACCUMULATION OF FLUID 01/03/22  Yes [provider]  ?lisinopril (PRINIVIL,ZESTRIL) 40 MG tablet Take 40 mg by mouth daily.   Yes [provider]  ?metoprolol succinate (TOPROL-XL) 100 MG 24 hr tablet TAKE ONE TABLET BY MOUTH EVERY DAY FOR HEART AND BLOOD PRESSURE 01/03/22  Yes [provider]  ?polyethylene glycol powder (GLYCOLAX/MIRALAX) 17 GM/SCOOP powder MIX 17GM (1 CAPFUL) BY MOUTH EVERY DAY AS NEEDED MIX 17 GRAMS (USE BOTTLE CAP) IN  LIQUID OF CHOICE AS DIRECTED FOR CONSTIPATION 11/30/21  Yes [provider]  ?traZODone (DESYREL) 50 MG tablet Take 1 tablet by mouth at bedtime as needed. 08/15/21  Yes [provider]  ?benzonatate (TESSALON) 200 MG capsule Take 1 capsule (200 mg total) by mouth 3 (three) times daily. 03/01/22   Shawna Clamp, MD  ? ? ?Inpatient Medications: ?Scheduled Meds: ? atorvastatin  80 mg Oral Daily  ? benzonatate  200 mg Oral TID  ? furosemide  40 mg Intravenous Q12H  ? loratadine  10 mg Oral Daily  ? And  ? pseudoephedrine  120 mg Oral BID  ? metoprolol succinate  100 mg Oral Daily  ? polyethylene glycol  17 g Oral Daily  ? predniSONE  40 mg Oral Q breakfast  ? senna-docusate  1 tablet Oral BID  ? ?Continuous Infusions: ? heparin 1,600 Units/hr (03/10/22 0732)  ? ?PRN Meds: ?acetaminophen **OR** acetaminophen, albuterol, bisacodyl, ipratropium-albuterol, traZODone ? ?Allergies:   No Known Allergies ? ?Social History:   ?Social History  ? ?Socioeconomic History  ? Marital status: Married  ?  Spouse name: Caleen Essex  ? Number of children: 1  ? Years of education: Not on file  ? Highest education level: Not on file  ?  Occupational History  ? Occupation: retired and disabled  ?Tobacco Use  ? Smoking status: Some Days  ?  Packs/day: 0.50  ?  Types: Cigarettes  ? Smokeless tobacco: Never  ?Vaping Use  ? Vaping Use: Never used  ?Substance and Sexual Activity  ? Alcohol use: Yes  ?  Alcohol/week: 14.0 standard drinks  ?  Types: 14 Cans of beer per week  ? Drug use: Yes  ?  Types: Marijuana  ?  Comment: "sometimes, not very often"  ? Sexual activity: Yes  ?  Birth control/protection: None  ?Other Topics Concern  ? Not on file  ?Social History Narrative  ? Not on file  ? ?Social Determinants of Health  ? ?Financial Resource Strain: Not on file  ?Food Insecurity: Not on file  ?Transportation Needs: Not on file  ?Physical Activity: Not on file  ?Stress: Not on file  ?Social Connections: Not on file  ?Intimate Partner  Violence: Not on file  ?  ?Family History:   ? ?Family History  ?Problem Relation Age of Onset  ? Stroke Mother   ? Heart attack Father   ? Lung cancer Father   ? Kidney disease Father   ? Prostate cancer Neg

## 2022-03-10 NOTE — Progress Notes (Signed)
ANTICOAGULATION CONSULT NOTE ? ?Pharmacy Consult for heparin infusion ?Indication: pulmonary embolus ? ?No Known Allergies ? ?Patient Measurements: ?Height: 5\' 11"  (180.3 cm) ?Weight: 132.1 kg (291 lb 3.6 oz) ?IBW/kg (Calculated) : 75.3 ?Heparin Dosing Weight: 108.3 kg ? ?Vital Signs: ?Temp: 97.8 ?F (36.6 ?C) (04/23 2044) ?Temp Source: Axillary (04/23 2044) ?BP: 145/83 (04/23 2044) ?Pulse Rate: 80 (04/23 1517) ? ?Labs: ?Recent Labs  ?  03/09/22 ?0003 03/09/22 ?0201 03/09/22 ?0305 03/09/22 ?1258 03/10/22 ?4680 03/10/22 ?1400 03/10/22 ?2214  ?HGB 11.7*  --   --   --  11.4*  --   --   ?HCT 38.5*  --   --   --  36.2*  --   --   ?PLT 140*  --   --   --  143*  --   --   ?APTT  --   --  35  --   --   --   --   ?LABPROT  --   --  13.9  --   --   --   --   ?INR  --   --  1.1  --   --   --   --   ?HEPARINUNFRC  --   --   --    < > 0.72* 0.69 0.65  ?CREATININE 1.62*  --   --   --  1.54*  --   --   ?TROPONINIHS 30* 32*  --   --   --   --   --   ? < > = values in this interval not displayed.  ? ? ? ?Estimated Creatinine Clearance: 55.7 mL/min (A) (by C-G formula based on SCr of 1.54 mg/dL (H)). ? ? ?Medical History: ?Past Medical History:  ?Diagnosis Date  ? Anxiety   ? ptsd  ? Arthritis   ? Cancer [facility])   ? PROSTATE  ? CHF (congestive heart failure) ([place])   ? Chronic kidney disease   ? stones  ? COPD (chronic obstructive pulmonary disease) ([place])   ? Dysrhythmia   ? History of kidney stones   ? Hypertension   ? Kidney stone   ? Pneumonia   ? 06/27/16  ? Shortness of breath dyspnea   ? ? ?Assessment: ?Pt is 76 yo male presenting to ED w/ SOB and leg edema found with "two isolated subsegmental pulmonary emboli at the bifurcation in the right upper lobe pulmonary artery." ? ?4/22 1258 HL 0.52 ?4/22 2118 HL 0.66 ?4/23 0452 HL 0.72, supratherapeutic ?4/23 1400 HL 0.69  ?4/23 2214 HL 0.65 ? ?Goal of Therapy:  ?Heparin level 0.3-0.7 units/ml ?Monitor platelets by anticoagulation protocol: Yes ?  ?Plan:  ?Heparin level remains  therapeutic.  ?Continue heparin infusion to 1600 units/hr.  ?Recheck HL daily w/ AM labs while therapeutic.  ?CBC daily while on heparin.  ? ?Renda Rolls, PharmD, MBA ?03/10/2022 ?11:10 PM ? ? ? ? ? ?

## 2022-03-10 NOTE — Consult Note (Signed)
? ?Hematology/Oncology Consult note ?[place] ?[phone] [phone] ? ?Patient Care Team: ?System, Provider Not In as PCP - General  ? ?Name of the patient: Edwin Donovan  ?[id number]  ?November 20, 1945  ? ? ?Reason for consukt: lung mass ? ?Requesting physician: Dr. Arbutus Ped ? ?Date of visit: 03/10/2022 ? ? ? ?History of presenting illness- Patient is a 76 year old male with a past medical history significant for COPD on home oxygen, diastolic heart failure, hypertension, prostate cancer and history of urothelial carcinoma in 2020 s/p surgery.  He was admitted to the hospital for exertional shortness of breath and underwent CT angio chest which showed 2 isolated subsegmental pulmonary emboli.  Overall clot burden very small.  17 mm spiculated right upper lobe lung lesion and a 10 x 7.4 cm left mediastinal mass extending into the left upper lobe and left perihilar region concerning for lung cancer particularly small cell lung cancer.  At baseline patient lives alone and has a girlfriend who is involved in his care.  States that he quit smoking about 2 years ago. ? ?ECOG PS- 2 ? ?Pain scale- 0 ? ? ?Review of systems- Review of Systems  ?Constitutional:  Positive for malaise/fatigue.  ?Respiratory:  Positive for shortness of breath.   ? ?No Known Allergies ? ?Patient Active Problem List  ? Diagnosis Date Noted  ? History of renal carcinoma 03/10/2022  ? Prostate cancer ([place]) 03/10/2022  ? Lupus erythematosus 03/10/2022  ? Acute pulmonary embolism ([place]) 03/09/2022  ? Nodule of upper lobe of right lung 03/09/2022  ? COPD with acute exacerbation ([place]) 03/09/2022  ? Essential hypertension 03/09/2022  ? Dyslipidemia 03/09/2022  ? AKI (acute kidney injury) ([place]) 03/09/2022  ? Acute on chronic diastolic CHF (congestive heart failure) ([place]) 03/09/2022  ? Urothelial carcinoma ([place]) 02/28/2019  ? History of prostate cancer 08/04/2018  ? Cervicalgia 12/25/2017  ? Spondylosis of cervical  region without myelopathy or radiculopathy 12/25/2017  ? Primary osteoarthritis of both knees 12/25/2017  ? Lumbar degenerative disc disease 12/25/2017  ? Chronic pain syndrome 12/25/2017  ? COPD (chronic obstructive pulmonary disease) ([place]) 11/03/2017  ? Hypertension 11/03/2017  ? Insomnia 11/03/2017  ? Anxiety 11/03/2017  ? Sepsis ([place]) 06/28/2016  ? Right ureteral stone   ? ? ? ?Past Medical History:  ?Diagnosis Date  ? Anxiety   ? ptsd  ? Arthritis   ? Cancer [facility])   ? PROSTATE  ? CHF (congestive heart failure) ([place])   ? Chronic kidney disease   ? stones  ? COPD (chronic obstructive pulmonary disease) ([place])   ? Dysrhythmia   ? History of kidney stones   ? Hypertension   ? Kidney stone   ? Pneumonia   ? 06/27/16  ? Shortness of breath dyspnea   ? ? ? ?Past Surgical History:  ?Procedure Laterality Date  ? CYSTOSCOPY W/ RETROGRADES Bilateral 12/26/2017  ? Procedure: CYSTOSCOPY WITH RETROGRADE PYELOGRAM;  Surgeon: Abbie Sons, MD;  Location: [facility] ORS;  Service: Urology;  Laterality: Bilateral;  ? CYSTOSCOPY W/ RETROGRADES Right 02/09/2019  ? Procedure: CYSTOSCOPY WITH RETROGRADE PYELOGRAM;  Surgeon: Abbie Sons, MD;  Location: [facility];  Service: Urology;  Laterality: Right;  ? CYSTOSCOPY W/ URETERAL STENT PLACEMENT Right 07/29/2016  ? Procedure: CYSTOSCOPY WITH STENT REPLACEMENT;  Surgeon: Hollice Espy, MD;  Location: [facility];  Service: Urology;  Laterality: Right;  ? CYSTOSCOPY WITH BIOPSY Right 07/29/2016  ? Procedure: CYSTOSCOPY WITH BIOPSY;  Surgeon: Hollice Espy, MD;  Location: [facility];  Service:  Urology;  Laterality: Right;  ? CYSTOSCOPY WITH BIOPSY Right 02/09/2019  ? Procedure: CYSTOSCOPY WITH BIOPSY;  Surgeon: Abbie Sons, MD;  Location: [facility];  Service: Urology;  Laterality: Right;  ? CYSTOSCOPY WITH STENT PLACEMENT Right 06/28/2016  ? Procedure: CYSTOSCOPY WITH STENT PLACEMENT;  Surgeon: Hollice Espy, MD;  Location: [facility];  Service: Urology;  Laterality: Right;  ? CYSTOSCOPY WITH  STENT PLACEMENT Right 02/09/2019  ? Procedure: CYSTOSCOPY WITH STENT PLACEMENT;  Surgeon: Abbie Sons, MD;  Location: [facility];  Service: Urology;  Laterality: Right;  ? CYSTOSCOPY/URETEROSCOPY/HOLMIUM LASER/STENT PLACEMENT Right 12/26/2017  ? Procedure: CYSTOSCOPY/URETEROSCOPY/HOLMIUM LASER/STENT PLACEMENT;  Surgeon: Abbie Sons, MD;  Location: [facility];  Service: Urology;  Laterality: Right;  ? EXTRACORPOREAL SHOCK WAVE LITHOTRIPSY Right 12/04/2017  ? Procedure: EXTRACORPOREAL SHOCK WAVE LITHOTRIPSY (ESWL);  Surgeon: Hollice Espy, MD;  Location: [facility];  Service: Urology;  Laterality: Right;  ? PROSTATECTOMY    ? URETEROSCOPY Right 07/29/2016  ? Procedure: URETEROSCOPY;  Surgeon: Hollice Espy, MD;  Location: [facility];  Service: Urology;  Laterality: Right;  ? URETEROSCOPY Right 02/09/2019  ? Procedure: URETEROSCOPY- DIAGNOSTIC;  Surgeon: Abbie Sons, MD;  Location: [facility];  Service: Urology;  Laterality: Right;  ? ? ?Social History  ? ?Socioeconomic History  ? Marital status: Married  ?  Spouse name: Caleen Essex  ? Number of children: 1  ? Years of education: Not on file  ? Highest education level: Not on file  ?Occupational History  ? Occupation: retired and disabled  ?Tobacco Use  ? Smoking status: Some Days  ?  Packs/day: 0.50  ?  Types: Cigarettes  ? Smokeless tobacco: Never  ?Vaping Use  ? Vaping Use: Never used  ?Substance and Sexual Activity  ? Alcohol use: Yes  ?  Alcohol/week: 14.0 standard drinks  ?  Types: 14 Cans of beer per week  ? Drug use: Yes  ?  Types: Marijuana  ?  Comment: "sometimes, not very often"  ? Sexual activity: Yes  ?  Birth control/protection: None  ?Other Topics Concern  ? Not on file  ?Social History Narrative  ? Not on file  ? ?Social Determinants of Health  ? ?Financial Resource Strain: Not on file  ?Food Insecurity: Not on file  ?Transportation Needs: Not on file  ?Physical Activity: Not on file  ?Stress: Not on file  ?Social Connections: Not on file  ?Intimate Partner  Violence: Not on file  ? ?  ?Family History  ?Problem Relation Age of Onset  ? Stroke Mother   ? Heart attack Father   ? Lung cancer Father   ? Kidney disease Father   ? Prostate cancer Neg Hx   ? Bladder Cancer Neg Hx   ? ? ? ?Current Facility-Administered Medications:  ?  acetaminophen (TYLENOL) tablet 650 mg, 650 mg, Oral, Q6H PRN **OR** acetaminophen (TYLENOL) suppository 650 mg, 650 mg, Rectal, Q6H PRN, Mansy, Jan A, MD ?  albuterol (PROVENTIL) (2.5 MG/3ML) 0.083% nebulizer solution 3 mL, 3 mL, Inhalation, Q4H PRN, Nicole Kindred A, DO, 3 mL at 03/10/22 2145 ?  atorvastatin (LIPITOR) tablet 80 mg, 80 mg, Oral, Daily, Mansy, Jan A, MD, 80 mg at 03/10/22 0803 ?  benzonatate (TESSALON) capsule 200 mg, 200 mg, Oral, TID, Mansy, Jan A, MD, 200 mg at 03/10/22 2145 ?  bisacodyl (DULCOLAX) EC tablet 5 mg, 5 mg, Oral, Daily PRN, Nicole Kindred A, DO, 5 mg at 03/09/22 1615 ?  furosemide (LASIX) injection 40 mg, 40 mg, Intravenous,  Q12H, Mansy, Jan A, MD, 40 mg at 03/10/22 2145 ?  heparin ADULT infusion 100 units/mL (25000 units/238mL), 1,600 Units/hr, Intravenous, Continuous, Renda Rolls, RPH, Last Rate: 16 mL/hr at 03/10/22 0732, 1,600 Units/hr at 03/10/22 0732 ?  ipratropium-albuterol (DUONEB) 0.5-2.5 (3) MG/3ML nebulizer solution 3 mL, 3 mL, Nebulization, Q6H PRN, Lanney Gins, Fuad, MD, 3 mL at 03/10/22 1517 ?  loratadine (CLARITIN) tablet 10 mg, 10 mg, Oral, Daily, 10 mg at 03/10/22 0804 **AND** pseudoephedrine (SUDAFED) 12 hr tablet 120 mg, 120 mg, Oral, BID, Mansy, Jan A, MD, 120 mg at 03/10/22 2145 ?  metoprolol succinate (TOPROL-XL) 24 hr tablet 100 mg, 100 mg, Oral, Daily, Mansy, Jan A, MD, 100 mg at 03/10/22 7614 ?  polyethylene glycol (MIRALAX / GLYCOLAX) packet 17 g, 17 g, Oral, Daily, Nicole Kindred A, DO, 17 g at 03/10/22 0804 ?  [EXPIRED] methylPREDNISolone sodium succinate (SOLU-MEDROL) 40 mg/mL injection 40 mg, 40 mg, Intravenous, Q12H, 40 mg at 03/09/22 2046 **FOLLOWED BY** predniSONE (DELTASONE)  tablet 40 mg, 40 mg, Oral, Q breakfast, Mansy, Jan A, MD, 40 mg at 03/10/22 0805 ?  senna-docusate (Senokot-S) tablet 1 tablet, 1 tablet, Oral, BID, Ezekiel Slocumb, DO, 1 tablet at 03/10/22 0804 ?  tra

## 2022-03-10 NOTE — Progress Notes (Signed)
He reports ?Progress Note ? ? ?Patient: Edwin Donovan. FXT:[id number] DOB: 1946/01/28 DOA: 03/09/2022     1 ?DOS: the patient was seen and examined on 03/10/2022 ?  ?Brief hospital course: ?Edwin Donovan. is a 76 y.o. male with past medical history COPD not on home oxygen prior to recent hospital d/c on 2/99/24, chronic diastolic CHF, hypertension and CKD stage II with hx or Right nephrectomy.  He presented to the ED early AM on 03/09/2022 with progressively worsening lower extremity edema, significant orthopnea, dyspnea worsening on exertion over the last couple weeks, in addition to worsening cough with wheezing.   ? ?ED Course -- RR 22, spO2 95% on 3 L/min  O2, otherwise stable vitals.  Labs showed AKI, mild anemia, mild thrombocytopenia, minimally elevated hs-troponin which trended flat. ? ?Imaging -- ?Chest x-ray - large masslike opacity in the left hilum. ?Chest CTA  - two isolated subsegmental pulmonary emboli at the bifurcation in the right upper lobe pulmonary artery. Overall clot burden is very small.    ?17 mm spiculated nodule in the posterior right upper lobe.  Associated 10.0 x 7.4 cm left mediastinal mass extending into the medial left upper lobe and left perihilar region. This appearance raises concern for primary bronchogenic neoplasm such as small cell lung cancer. ? ?Admitted to hospitalist service with pulmonology, oncology and palliative care consulted.  ? ? ?Assessment and Plan: ?* Acute pulmonary embolism ([place]) ?Continue heparin and transition to Eliquis after any procedures. ?Likely provoked by underlying malignancy. ? ?COPD with acute exacerbation ([place]) ?Presented with wheezing and increased O2 requirement.  Started on IV steroids on admission, transitioned to prednisone. ?--Continue prednisone ?--Duonebs and PRN albuterol nebs ?--Antitussives, mucolytics ?--Pulmonary hygiene ?--Supplement O2 PRN per protocol ? ?Acute on chronic diastolic CHF (congestive heart failure)  ([place]) ?Echo this admission on 4/22 technically limited study with preserved EF, but diastolic parameters indeterminate. ?Started on IV Lasix BID on admission. ?4/22: output 2850 cc ?Net IO Since Admission: -2,096.34 mL [03/10/22 1425] ?--Cardiology following ?--Continue IV lasix 40 mg BID ?--Continue Toprol-XL ?--Follow renal function, electrolytes ?--Strict I/O's, daily weights ? ? ?Nodule of upper lobe of right lung ?17 mm spiculated nodule in the posterior right upper lobe. ?Associated 10.0 x 7.4 cm left mediastinal mass extending into the ?medial left upper lobe and left perihilar region. This appearance ?raises concern for primary bronchogenic neoplasm such as small cell ?lung cancer. ?-Pulmonary, Oncology, Palliative Care are consulted ?--Cardiology consulted for risk assessment prior to bronchoscopy for biopsy ? ?Lupus erythematosus ?Noted in chart review of [place] records. ?No apparent acute issues. ? ?Prostate cancer ([place]) ?Noted.  Monitor for obstructive urinary symptoms.   ? ?History of renal carcinoma ?S/p right nephrectomy, now solitary left kidney.  Monitor renal function and urine output closely. ? ?AKI (acute kidney injury) ([place]) ?Suspect prerenal from acute CHF with secondary renal hypoperfusion. ?Renal function improving with diuresis. ?Monitor BMP. ?Avoid nephrotoxins, hypotension ?Maintain MAP>65 ?Renally dose meds ? ?Dyslipidemia ?Continue statin  ? ?Essential hypertension ?Continue Toprol-XL. ?Zestril was held on admission due to AKI.  Resume Zestril with renal function improving and BP elevated.  ? ? ? ? ?  ? ?Subjective: Pt awake sitting up in bed, wife at bedside when seen on rounds morning.  He reports currently feeling a little more short of breath and requesting his albuterol inhaler.  High-volume urine output.  No fevers or chills or any other acute complaints at this time. ? ?Physical Exam: ?Vitals:  ? 03/10/22  7564 03/10/22 0431 03/10/22 0808 03/10/22 1101  ?BP: 135/84  131/80 (!) 142/87   ?Pulse: 74  73 79  ?Resp: 18  16 18   ?Temp: 97.6 ?F (36.4 ?C)  98 ?F (36.7 ?C) 97.8 ?F (36.6 ?C)  ?TempSrc:   Oral Oral  ?SpO2: 95%  96% 94%  ?Weight:  132.1 kg    ?Height:      ? ?General exam: awake, alert, no acute distress, obese, chronically ill-appearing ?HEENT:Moist mucus membranes, hearing grossly normal  ?Respiratory system: CTAB but generally diminished with intermittent expiratory wheezes, mildly increased respiratory effort with some accessory muscle use noted. ?Cardiovascular system: normal S1/S2, RRR, unable to appreciate JVD due to body habitus, mildly improved bilateral lower extremity edema.   ?Gastrointestinal system: soft, nontender nondistended abdomen ?Central nervous system: A&O x4. no gross focal neurologic deficits, normal speech ?Extremities: moves all, improving lower extremity edema bilaterally, Unna boot on right lower extremity, normal tone, lower extremity venous stasis changes ?Skin: dry, intact, normal temperature ?Psychiatry: normal mood, congruent affect, judgement and insight appear normal ? ? ?Data Reviewed: ? ?Notable labs: Glucose 160, BUN 54, creatinine 1.54 improving, GFR 46, hemoglobin stable 11.4, platelets stable 143 ? ?Family Communication: Wife at bedside on rounds today ? ?Disposition: ?Status is: Inpatient ?Remains inpatient appropriate because: Remains on IV therapies and ongoing evaluation for likely new lung malignancy as outlined above ? ? ? Planned Discharge Destination: Home with Home Health ? ? ? ?Time spent: 35 minutes ? ?Author: ?Ezekiel Slocumb, DO ?03/10/2022 2:36 PM ? ?For on call review [email or web address].  ?

## 2022-03-10 NOTE — Assessment & Plan Note (Signed)
Noted in chart review of [facility] records. ?No apparent acute issues. ?

## 2022-03-10 NOTE — Progress Notes (Signed)
ANTICOAGULATION CONSULT NOTE ? ?Pharmacy Consult for heparin infusion ?Indication: pulmonary embolus ? ?No Known Allergies ? ?Patient Measurements: ?Height: 5\' 11"  (180.3 cm) ?Weight: 132.1 kg (291 lb 3.6 oz) ?IBW/kg (Calculated) : 75.3 ?Heparin Dosing Weight: 108.3 kg ? ?Vital Signs: ?Temp: 97.6 ?F (36.4 ?C) (04/23 0428) ?Temp Source: Axillary (04/22 2320) ?BP: 135/84 (04/23 0428) ?Pulse Rate: 74 (04/23 0428) ? ?Labs: ?Recent Labs  ?  03/09/22 ?0003 03/09/22 ?0201 03/09/22 ?0305 03/09/22 ?1258 03/09/22 ?2118 03/10/22 ?0452  ?HGB 11.7*  --   --   --   --  11.4*  ?HCT 38.5*  --   --   --   --  36.2*  ?PLT 140*  --   --   --   --  143*  ?APTT  --   --  35  --   --   --   ?LABPROT  --   --  13.9  --   --   --   ?INR  --   --  1.1  --   --   --   ?HEPARINUNFRC  --   --   --  0.52 0.66 0.72*  ?CREATININE 1.62*  --   --   --   --  1.54*  ?TROPONINIHS 30* 32*  --   --   --   --   ? ? ? ?Estimated Creatinine Clearance: 55.7 mL/min (A) (by C-G formula based on SCr of 1.54 mg/dL (H)). ? ? ?Medical History: ?Past Medical History:  ?Diagnosis Date  ? Anxiety   ? ptsd  ? Arthritis   ? Cancer [facility])   ? PROSTATE  ? CHF (congestive heart failure) ([place])   ? Chronic kidney disease   ? stones  ? COPD (chronic obstructive pulmonary disease) ([place])   ? Dysrhythmia   ? History of kidney stones   ? Hypertension   ? Kidney stone   ? Pneumonia   ? 06/27/16  ? Shortness of breath dyspnea   ? ? ?Assessment: ?Pt is 76 yo male presenting to ED w/ SOB and leg edema found with "two isolated subsegmental pulmonary emboli at the bifurcation in the right upper lobe pulmonary artery." ? ?4/22 1258 HL 0.52 ?4/22 2118 HL 0.66 ?4/23 0452 HL 0.72, supratherapeutic ? ?Goal of Therapy:  ?Heparin level 0.3-0.7 units/ml ?Monitor platelets by anticoagulation protocol: Yes ?  ?Plan:  ?Heparin level remains therapeutic. Decrease heparin infusion to 1600 units/hr. Recheck heparin daily in 8 hr after rate change. CBC daily while on heparin.  ? ?Renda Rolls,  PharmD, MBA ?03/10/2022 ?5:42 AM ? ? ? ? ?

## 2022-03-10 NOTE — Assessment & Plan Note (Addendum)
Noted.  Monitor for obstructive urinary symptoms.   ?

## 2022-03-10 NOTE — Progress Notes (Signed)
? ? ? ?PULMONOLOGY ? ? ? ? ? ? ? ? ?Date: 03/10/2022,   ?MRN# [id number] Edwin Donovan. [birth year] ? ? ?  ?AdmissionWeight: (!) 141.5 kg                 ?CurrentWeight: 132.1 kg ? ?Referring provider: Dr. Arbutus Ped ? ? ?CHIEF COMPLAINT:  ? ?Nonmassive PE with RUL nodule and mediastinal lymphadenopathy ? ? ?HISTORY OF PRESENT ILLNESS  ? ?This is a pleasant 76 year old male with a history of prostate CA, disorder, arthritis, CHF, CKD COPD, dysrhythmia history of nephrolithiasis and essential previous episode of pneumonia in 2017, he reports worsening cough and wheezing.  He was found to be mildly tachypneic on arrival and required 3 L of supplemental oxygen to reach normoxia at SPO2 95%.  Labs showed CKD however troponins were essentially normal in the context of CKD with only mild elevation.  Influenza and COVID-19 are negative.  CTA was performed with findings of subsegmental pulmonary emboli as well as 17 mm spiculated nodule of the right upper lobe and associated mediastinal lymphadenopathy/mass.  He had nephrectomy in the past due to renal cell carcinoma.  He quit smoking 2 years ago. PCCM consultation was placed for additional evaluation and management.  ? ?Patient seen and evaluated at bedside he is improved clinically on supplemental O2.  We reviewed procedure. He may need outpatient scheduling due to [facility] requiring prior authorization.  We are working on this to clarify if possible to do procedure while inpatient.  ? ?PAST MEDICAL HISTORY  ? ?Past Medical History:  ?Diagnosis Date  ?? Anxiety   ? ptsd  ?? Arthritis   ?? Cancer [facility]   ? PROSTATE  ?? CHF (congestive heart failure) ([place])   ?? Chronic kidney disease   ? stones  ?? COPD (chronic obstructive pulmonary disease) ([place])   ?? Dysrhythmia   ?? History of kidney stones   ?? Hypertension   ?? Kidney stone   ?? Pneumonia   ? 06/27/16  ?? Shortness of breath dyspnea   ? ? ? ?SURGICAL HISTORY  ? ?Past Surgical History:  ?Procedure Laterality Date   ?? CYSTOSCOPY W/ RETROGRADES Bilateral 12/26/2017  ? Procedure: CYSTOSCOPY WITH RETROGRADE PYELOGRAM;  Surgeon: Abbie Sons, MD;  Location: [facility];  Service: Urology;  Laterality: Bilateral;  ?? CYSTOSCOPY W/ RETROGRADES Right 02/09/2019  ? Procedure: CYSTOSCOPY WITH RETROGRADE PYELOGRAM;  Surgeon: Abbie Sons, MD;  Location: [facility];  Service: Urology;  Laterality: Right;  ?? CYSTOSCOPY W/ URETERAL STENT PLACEMENT Right 07/29/2016  ? Procedure: CYSTOSCOPY WITH STENT REPLACEMENT;  Surgeon: Hollice Espy, MD;  Location: [facility];  Service: Urology;  Laterality: Right;  ?? CYSTOSCOPY WITH BIOPSY Right 07/29/2016  ? Procedure: CYSTOSCOPY WITH BIOPSY;  Surgeon: Hollice Espy, MD;  Location: [facility];  Service: Urology;  Laterality: Right;  ?? CYSTOSCOPY WITH BIOPSY Right 02/09/2019  ? Procedure: CYSTOSCOPY WITH BIOPSY;  Surgeon: Abbie Sons, MD;  Location: [facility];  Service: Urology;  Laterality: Right;  ?? CYSTOSCOPY WITH STENT PLACEMENT Right 06/28/2016  ? Procedure: CYSTOSCOPY WITH STENT PLACEMENT;  Surgeon: Hollice Espy, MD;  Location: [facility];  Service: Urology;  Laterality: Right;  ?? CYSTOSCOPY WITH STENT PLACEMENT Right 02/09/2019  ? Procedure: CYSTOSCOPY WITH STENT PLACEMENT;  Surgeon: Abbie Sons, MD;  Location: [facility];  Service: Urology;  Laterality: Right;  ?? CYSTOSCOPY/URETEROSCOPY/HOLMIUM LASER/STENT PLACEMENT Right 12/26/2017  ? Procedure: CYSTOSCOPY/URETEROSCOPY/HOLMIUM LASER/STENT PLACEMENT;  Surgeon: Abbie Sons, MD;  Location: [facility];  Service: Urology;  Laterality: Right;  ?? EXTRACORPOREAL SHOCK WAVE LITHOTRIPSY Right 12/04/2017  ? Procedure: EXTRACORPOREAL SHOCK WAVE LITHOTRIPSY (ESWL);  Surgeon: Hollice Espy, MD;  Location: [facility];  Service: Urology;  Laterality: Right;  ?? PROSTATECTOMY    ?? URETEROSCOPY Right 07/29/2016  ? Procedure: URETEROSCOPY;  Surgeon: Hollice Espy, MD;  Location: [facility];  Service: Urology;  Laterality: Right;  ?? URETEROSCOPY Right  02/09/2019  ? Procedure: URETEROSCOPY- DIAGNOSTIC;  Surgeon: Abbie Sons, MD;  Location: [facility];  Service: Urology;  Laterality: Right;  ? ? ? ?FAMILY HISTORY  ? ?Family History  ?Problem Relation Age of Onset  ?? Stroke Mother   ?? Heart attack Father   ?? Lung cancer Father   ?? Kidney disease Father   ?? Prostate cancer Neg Hx   ?? Bladder Cancer Neg Hx   ? ? ? ?SOCIAL HISTORY  ? ?Social History  ? ?Tobacco Use  ?? Smoking status: Some Days  ?  Packs/day: 0.50  ?  Types: Cigarettes  ?? Smokeless tobacco: Never  ?Vaping Use  ?? Vaping Use: Never used  ?Substance Use Topics  ?? Alcohol use: Yes  ?  Alcohol/week: 14.0 standard drinks  ?  Types: 14 Cans of beer per week  ?? Drug use: Yes  ?  Types: Marijuana  ?  Comment: "sometimes, not very often"  ? ? ? ?MEDICATIONS  ? ? ?Home Medication:  ? ?  ?Current Medication: ? ?Current Facility-Administered Medications:  ??  acetaminophen (TYLENOL) tablet 650 mg, 650 mg, Oral, Q6H PRN **OR** acetaminophen (TYLENOL) suppository 650 mg, 650 mg, Rectal, Q6H PRN, Mansy, Jan A, MD ??  albuterol (PROVENTIL) (2.5 MG/3ML) 0.083% nebulizer solution 3 mL, 3 mL, Inhalation, Q4H PRN, Nicole Kindred A, DO ??  atorvastatin (LIPITOR) tablet 80 mg, 80 mg, Oral, Daily, Mansy, Jan A, MD, 80 mg at 03/10/22 0803 ??  benzonatate (TESSALON) capsule 200 mg, 200 mg, Oral, TID, Mansy, Jan A, MD, 200 mg at 03/10/22 0803 ??  bisacodyl (DULCOLAX) EC tablet 5 mg, 5 mg, Oral, Daily PRN, Nicole Kindred A, DO, 5 mg at 03/09/22 1615 ??  furosemide (LASIX) injection 40 mg, 40 mg, Intravenous, Q12H, Mansy, Jan A, MD, 40 mg at 03/10/22 1057 ??  heparin ADULT infusion 100 units/mL (25000 units/219mL), 1,600 Units/hr, Intravenous, Continuous, Renda Rolls, RPH, Last Rate: 16 mL/hr at 03/10/22 0732, 1,600 Units/hr at 03/10/22 0732 ??  ipratropium-albuterol (DUONEB) 0.5-2.5 (3) MG/3ML nebulizer solution 3 mL, 3 mL, Nebulization, Q6H PRN, Ottie Glazier, MD, 3 mL at 03/10/22 0944 ??  loratadine  (CLARITIN) tablet 10 mg, 10 mg, Oral, Daily, 10 mg at 03/10/22 0804 **AND** pseudoephedrine (SUDAFED) 12 hr tablet 120 mg, 120 mg, Oral, BID, Mansy, Jan A, MD, 120 mg at 03/10/22 0802 ??  metoprolol succinate (TOPROL-XL) 24 hr tablet 100 mg, 100 mg, Oral, Daily, Mansy, Jan A, MD, 100 mg at 03/10/22 2585 ??  polyethylene glycol (MIRALAX / GLYCOLAX) packet 17 g, 17 g, Oral, Daily, Nicole Kindred A, DO, 17 g at 03/10/22 0804 ??  [EXPIRED] methylPREDNISolone sodium succinate (SOLU-MEDROL) 40 mg/mL injection 40 mg, 40 mg, Intravenous, Q12H, 40 mg at 03/09/22 2046 **FOLLOWED BY** predniSONE (DELTASONE) tablet 40 mg, 40 mg, Oral, Q breakfast, Mansy, Jan A, MD, 40 mg at 03/10/22 0805 ??  senna-docusate (Senokot-S) tablet 1 tablet, 1 tablet, Oral, BID, Ezekiel Slocumb, DO, 1 tablet at 03/10/22 0804 ??  traZODone (DESYREL) tablet 50 mg, 50 mg, Oral, QHS PRN, Mansy, Arvella Merles, MD, 50 mg  at 03/09/22 2046 ? ? ? ?ALLERGIES  ? ?Patient has no known allergies. ? ? ? ? ?REVIEW OF SYSTEMS  ? ? ?Review of Systems: ? ?Gen:  Denies  fever, sweats, chills weigh loss  ?HEENT: Denies blurred vision, double vision, ear pain, eye pain, hearing loss, nose bleeds, sore throat ?Cardiac:  No dizziness, chest pain or heaviness, chest tightness,edema ?Resp:   reports dyspnea chronically  ?Gi: Denies swallowing difficulty, stomach pain, nausea or vomiting, diarrhea, constipation, bowel incontinence ?Gu:  Denies bladder incontinence, burning urine ?Ext:   Denies Joint pain, stiffness or swelling ?Skin: Denies  skin rash, easy bruising or bleeding or hives ?Endoc:  Denies polyuria, polydipsia , polyphagia or weight change ?Psych:   Denies depression, insomnia or hallucinations  ? ?Other:  All other systems negative ? ? ?VS: BP (!) 142/87 (BP Location: Right Arm)   Pulse 79   Temp 97.8 ?F (36.6 ?C) (Oral)   Resp 18   Ht 5\' 11"  (1.803 m)   Wt 132.1 kg   SpO2 94%   BMI 40.62 kg/m?   ? ? ? ?PHYSICAL EXAM  ? ? ?GENERAL:NAD, no fevers, chills, no  weakness no fatigue ?HEAD: Normocephalic, atraumatic.  ?EYES: Pupils equal, round, reactive to light. Extraocular muscles intact. No scleral icterus.  ?MOUTH: Moist mucosal membrane. Dentition intact. No abscess noted.

## 2022-03-10 NOTE — Assessment & Plan Note (Addendum)
S/p right nephrectomy, now solitary left kidney.  Monitor renal function and urine output closely. ?

## 2022-03-10 NOTE — Progress Notes (Signed)
ANTICOAGULATION CONSULT NOTE ? ?Pharmacy Consult for heparin infusion ?Indication: pulmonary embolus ? ?No Known Allergies ? ?Patient Measurements: ?Height: 5\' 11"  (180.3 cm) ?Weight: 132.1 kg (291 lb 3.6 oz) ?IBW/kg (Calculated) : 75.3 ?Heparin Dosing Weight: 108.3 kg ? ?Vital Signs: ?Temp: 97.8 ?F (36.6 ?C) (04/23 1101) ?Temp Source: Oral (04/23 1101) ?BP: 142/87 (04/23 1101) ?Pulse Rate: 79 (04/23 1101) ? ?Labs: ?Recent Labs  ?  03/09/22 ?0003 03/09/22 ?0201 03/09/22 ?0305 03/09/22 ?1258 03/09/22 ?2118 03/10/22 ?0452 03/10/22 ?1400  ?HGB 11.7*  --   --   --   --  11.4*  --   ?HCT 38.5*  --   --   --   --  36.2*  --   ?PLT 140*  --   --   --   --  143*  --   ?APTT  --   --  35  --   --   --   --   ?LABPROT  --   --  13.9  --   --   --   --   ?INR  --   --  1.1  --   --   --   --   ?HEPARINUNFRC  --   --   --    < > 0.66 0.72* 0.69  ?CREATININE 1.62*  --   --   --   --  1.54*  --   ?TROPONINIHS 30* 32*  --   --   --   --   --   ? < > = values in this interval not displayed.  ? ? ? ?Estimated Creatinine Clearance: 55.7 mL/min (A) (by C-G formula based on SCr of 1.54 mg/dL (H)). ? ? ?Medical History: ?Past Medical History:  ?Diagnosis Date  ? Anxiety   ? ptsd  ? Arthritis   ? Cancer [facility])   ? PROSTATE  ? CHF (congestive heart failure) ([place])   ? Chronic kidney disease   ? stones  ? COPD (chronic obstructive pulmonary disease) ([place])   ? Dysrhythmia   ? History of kidney stones   ? Hypertension   ? Kidney stone   ? Pneumonia   ? 06/27/16  ? Shortness of breath dyspnea   ? ? ?Assessment: ?Pt is 76 yo male presenting to ED w/ SOB and leg edema found with "two isolated subsegmental pulmonary emboli at the bifurcation in the right upper lobe pulmonary artery." ? ?4/22 1258 HL 0.52 ?4/22 2118 HL 0.66 ?4/23 0452 HL 0.72, supratherapeutic ?4/23 1400 HL 0.69  ? ?Goal of Therapy:  ?Heparin level 0.3-0.7 units/ml ?Monitor platelets by anticoagulation protocol: Yes ?  ?Plan:  ?Heparin level is therapeutic. Continue heparin infusion  to 1600 units/hr. Recheck confirmatory heparin level in 8 hr. CBC daily while on heparin.  ? ?Pernell Dupre, PharmD, BCPS ?Clinical Pharmacist ?03/10/2022 ?2:42 PM ? ? ? ? ?

## 2022-03-11 ENCOUNTER — Encounter: Payer: Self-pay | Admitting: *Deleted

## 2022-03-11 DIAGNOSIS — R911 Solitary pulmonary nodule: Secondary | ICD-10-CM

## 2022-03-11 DIAGNOSIS — N179 Acute kidney failure, unspecified: Secondary | ICD-10-CM | POA: Diagnosis not present

## 2022-03-11 DIAGNOSIS — I2699 Other pulmonary embolism without acute cor pulmonale: Secondary | ICD-10-CM | POA: Diagnosis not present

## 2022-03-11 DIAGNOSIS — I5033 Acute on chronic diastolic (congestive) heart failure: Secondary | ICD-10-CM | POA: Diagnosis not present

## 2022-03-11 DIAGNOSIS — Z515 Encounter for palliative care: Secondary | ICD-10-CM

## 2022-03-11 LAB — BASIC METABOLIC PANEL
Anion gap: 7 (ref 5–15)
BUN: 53 mg/dL — ABNORMAL HIGH (ref 8–23)
CO2: 31 mmol/L (ref 22–32)
Calcium: 9.4 mg/dL (ref 8.9–10.3)
Chloride: 101 mmol/L (ref 98–111)
Creatinine, Ser: 1.52 mg/dL — ABNORMAL HIGH (ref 0.61–1.24)
GFR, Estimated: 47 mL/min — ABNORMAL LOW (ref >60)
Glucose, Bld: 106 mg/dL — ABNORMAL HIGH (ref 70–99)
Potassium: 4.3 mmol/L (ref 3.5–5.1)
Sodium: 139 mmol/L (ref 135–145)

## 2022-03-11 LAB — MRSA NEXT GEN BY PCR, NASAL: MRSA by PCR Next Gen: DETECTED — AB

## 2022-03-11 LAB — CBC
HCT: 36.4 % — ABNORMAL LOW (ref 39.0–52.0)
Hemoglobin: 11.7 g/dL — ABNORMAL LOW (ref 13.0–17.0)
MCH: 30.6 pg (ref 26.0–34.0)
MCHC: 32.1 g/dL (ref 30.0–36.0)
MCV: 95.3 fL (ref 80.0–100.0)
Platelets: 162 10*3/uL (ref 150–400)
RBC: 3.82 MIL/uL — ABNORMAL LOW (ref 4.22–5.81)
RDW: 14.1 % (ref 11.5–15.5)
WBC: 10.4 10*3/uL (ref 4.0–10.5)
nRBC: 0 % (ref 0.0–0.2)

## 2022-03-11 LAB — PROCALCITONIN: Procalcitonin: <0.1 ng/mL

## 2022-03-11 LAB — HEPARIN LEVEL (UNFRACTIONATED): Heparin Unfractionated: 0.59 IU/mL (ref 0.30–0.70)

## 2022-03-11 NOTE — Progress Notes (Signed)
? ? ? ?PULMONOLOGY ? ? ? ? ? ? ? ? ?Date: 03/11/2022,   ?MRN# [id number] Edwin Donovan. [birth year] ? ? ?  ?AdmissionWeight: (!) 141.5 kg                 ?CurrentWeight: 131.9 kg ? ?Referring provider: Dr. Arbutus Ped ? ? ?CHIEF COMPLAINT:  ? ?Nonmassive PE with RUL nodule and mediastinal lymphadenopathy ? ? ?HISTORY OF PRESENT ILLNESS  ? ?This is a pleasant 76 year old male with a history of prostate CA, disorder, arthritis, CHF, CKD COPD, dysrhythmia history of nephrolithiasis and essential previous episode of pneumonia in 2017, he reports worsening cough and wheezing.  He was found to be mildly tachypneic on arrival and required 3 L of supplemental oxygen to reach normoxia at SPO2 95%.  Labs showed CKD however troponins were essentially normal in the context of CKD with only mild elevation.  Influenza and COVID-19 are negative.  CTA was performed with findings of subsegmental pulmonary emboli as well as 17 mm spiculated nodule of the right upper lobe and associated mediastinal lymphadenopathy/mass.  He had nephrectomy in the past due to renal cell carcinoma.  He quit smoking 2 years ago. PCCM consultation was placed for additional evaluation and management.  ? ?03/11/22- Patient is stable on heparin drip and supplemental O2 at 3-4L/min. Has not had episodes of hemoptysis or chest pain. In process of discussing bronchoscopy approval with [facility].   ? ?PAST MEDICAL HISTORY  ? ?Past Medical History:  ?Diagnosis Date  ?? Anxiety   ? ptsd  ?? Arthritis   ?? Cancer [facility])   ? PROSTATE  ?? CHF (congestive heart failure) ([place])   ?? Chronic kidney disease   ? stones  ?? COPD (chronic obstructive pulmonary disease) ([place])   ?? Dysrhythmia   ?? History of kidney stones   ?? Hypertension   ?? Kidney stone   ?? Pneumonia   ? 06/27/16  ?? Shortness of breath dyspnea   ? ? ? ?SURGICAL HISTORY  ? ?Past Surgical History:  ?Procedure Laterality Date  ?? CYSTOSCOPY W/ RETROGRADES Bilateral 12/26/2017  ? Procedure: CYSTOSCOPY WITH  RETROGRADE PYELOGRAM;  Surgeon: Abbie Sons, MD;  Location: [facility];  Service: Urology;  Laterality: Bilateral;  ?? CYSTOSCOPY W/ RETROGRADES Right 02/09/2019  ? Procedure: CYSTOSCOPY WITH RETROGRADE PYELOGRAM;  Surgeon: Abbie Sons, MD;  Location: [facility];  Service: Urology;  Laterality: Right;  ?? CYSTOSCOPY W/ URETERAL STENT PLACEMENT Right 07/29/2016  ? Procedure: CYSTOSCOPY WITH STENT REPLACEMENT;  Surgeon: Hollice Espy, MD;  Location: [facility];  Service: Urology;  Laterality: Right;  ?? CYSTOSCOPY WITH BIOPSY Right 07/29/2016  ? Procedure: CYSTOSCOPY WITH BIOPSY;  Surgeon: Hollice Espy, MD;  Location: [facility];  Service: Urology;  Laterality: Right;  ?? CYSTOSCOPY WITH BIOPSY Right 02/09/2019  ? Procedure: CYSTOSCOPY WITH BIOPSY;  Surgeon: Abbie Sons, MD;  Location: [facility];  Service: Urology;  Laterality: Right;  ?? CYSTOSCOPY WITH STENT PLACEMENT Right 06/28/2016  ? Procedure: CYSTOSCOPY WITH STENT PLACEMENT;  Surgeon: Hollice Espy, MD;  Location: [facility];  Service: Urology;  Laterality: Right;  ?? CYSTOSCOPY WITH STENT PLACEMENT Right 02/09/2019  ? Procedure: CYSTOSCOPY WITH STENT PLACEMENT;  Surgeon: Abbie Sons, MD;  Location: [facility];  Service: Urology;  Laterality: Right;  ?? CYSTOSCOPY/URETEROSCOPY/HOLMIUM LASER/STENT PLACEMENT Right 12/26/2017  ? Procedure: CYSTOSCOPY/URETEROSCOPY/HOLMIUM LASER/STENT PLACEMENT;  Surgeon: Abbie Sons, MD;  Location: [facility];  Service: Urology;  Laterality: Right;  ?? EXTRACORPOREAL SHOCK WAVE LITHOTRIPSY Right 12/04/2017  ?  Procedure: EXTRACORPOREAL SHOCK WAVE LITHOTRIPSY (ESWL);  Surgeon: Hollice Espy, MD;  Location: [facility];  Service: Urology;  Laterality: Right;  ?? PROSTATECTOMY    ?? URETEROSCOPY Right 07/29/2016  ? Procedure: URETEROSCOPY;  Surgeon: Hollice Espy, MD;  Location: [facility];  Service: Urology;  Laterality: Right;  ?? URETEROSCOPY Right 02/09/2019  ? Procedure: URETEROSCOPY- DIAGNOSTIC;  Surgeon: Abbie Sons, MD;   Location: [facility];  Service: Urology;  Laterality: Right;  ? ? ? ?FAMILY HISTORY  ? ?Family History  ?Problem Relation Age of Onset  ?? Stroke Mother   ?? Heart attack Father   ?? Lung cancer Father   ?? Kidney disease Father   ?? Prostate cancer Neg Hx   ?? Bladder Cancer Neg Hx   ? ? ? ?SOCIAL HISTORY  ? ?Social History  ? ?Tobacco Use  ?? Smoking status: Some Days  ?  Packs/day: 0.50  ?  Types: Cigarettes  ?? Smokeless tobacco: Never  ?Vaping Use  ?? Vaping Use: Never used  ?Substance Use Topics  ?? Alcohol use: Yes  ?  Alcohol/week: 14.0 standard drinks  ?  Types: 14 Cans of beer per week  ?? Drug use: Yes  ?  Types: Marijuana  ?  Comment: "sometimes, not very often"  ? ? ? ?MEDICATIONS  ? ? ?Home Medication:  ? ?  ?Current Medication: ? ?Current Facility-Administered Medications:  ??  acetaminophen (TYLENOL) tablet 650 mg, 650 mg, Oral, Q6H PRN **OR** acetaminophen (TYLENOL) suppository 650 mg, 650 mg, Rectal, Q6H PRN, Mansy, Jan A, MD ??  albuterol (PROVENTIL) (2.5 MG/3ML) 0.083% nebulizer solution 3 mL, 3 mL, Inhalation, Q4H PRN, Nicole Kindred A, DO, 3 mL at 03/11/22 0924 ??  atorvastatin (LIPITOR) tablet 80 mg, 80 mg, Oral, Daily, Mansy, Jan A, MD, 80 mg at 03/11/22 3614 ??  benzonatate (TESSALON) capsule 200 mg, 200 mg, Oral, TID, Mansy, Jan A, MD, 200 mg at 03/11/22 0958 ??  bisacodyl (DULCOLAX) EC tablet 5 mg, 5 mg, Oral, Daily PRN, Nicole Kindred A, DO, 5 mg at 03/09/22 1615 ??  furosemide (LASIX) injection 40 mg, 40 mg, Intravenous, Q12H, Mansy, Jan A, MD, 40 mg at 03/11/22 1003 ??  heparin ADULT infusion 100 units/mL (25000 units/273mL), 1,600 Units/hr, Intravenous, Continuous, Renda Rolls, RPH, Last Rate: 16 mL/hr at 03/11/22 0026, 1,600 Units/hr at 03/11/22 0026 ??  ipratropium-albuterol (DUONEB) 0.5-2.5 (3) MG/3ML nebulizer solution 3 mL, 3 mL, Nebulization, Q6H PRN, Ottie Glazier, MD, 3 mL at 03/10/22 1517 ??  loratadine (CLARITIN) tablet 10 mg, 10 mg, Oral, Daily, 10 mg at 03/11/22 0959  **AND** pseudoephedrine (SUDAFED) 12 hr tablet 120 mg, 120 mg, Oral, BID, Mansy, Jan A, MD, 120 mg at 03/11/22 0957 ??  metoprolol succinate (TOPROL-XL) 24 hr tablet 100 mg, 100 mg, Oral, Daily, Mansy, Jan A, MD, 100 mg at 03/11/22 4315 ??  polyethylene glycol (MIRALAX / GLYCOLAX) packet 17 g, 17 g, Oral, Daily, Nicole Kindred A, DO, 17 g at 03/11/22 0957 ??  [EXPIRED] methylPREDNISolone sodium succinate (SOLU-MEDROL) 40 mg/mL injection 40 mg, 40 mg, Intravenous, Q12H, 40 mg at 03/09/22 2046 **FOLLOWED BY** predniSONE (DELTASONE) tablet 40 mg, 40 mg, Oral, Q breakfast, Mansy, Jan A, MD, 40 mg at 03/11/22 1001 ??  senna-docusate (Senokot-S) tablet 1 tablet, 1 tablet, Oral, BID, Ezekiel Slocumb, DO, 1 tablet at 03/11/22 0959 ??  traZODone (DESYREL) tablet 50 mg, 50 mg, Oral, QHS PRN, Mansy, Jan A, MD, 50 mg at 03/10/22 2233 ? ? ? ?ALLERGIES  ? ?Patient  has no known allergies. ? ? ? ? ?REVIEW OF SYSTEMS  ? ? ?Review of Systems: ? ?Gen:  Denies  fever, sweats, chills weigh loss  ?HEENT: Denies blurred vision, double vision, ear pain, eye pain, hearing loss, nose bleeds, sore throat ?Cardiac:  No dizziness, chest pain or heaviness, chest tightness,edema ?Resp:   reports dyspnea chronically  ?Gi: Denies swallowing difficulty, stomach pain, nausea or vomiting, diarrhea, constipation, bowel incontinence ?Gu:  Denies bladder incontinence, burning urine ?Ext:   Denies Joint pain, stiffness or swelling ?Skin: Denies  skin rash, easy bruising or bleeding or hives ?Endoc:  Denies polyuria, polydipsia , polyphagia or weight change ?Psych:   Denies depression, insomnia or hallucinations  ? ?Other:  All other systems negative ? ? ?VS: BP (!) 157/88 (BP Location: Left Arm)   Pulse 77   Temp (!) 97.5 ?F (36.4 ?C) (Oral)   Resp 20   Ht 5\' 11"  (1.803 m)   Wt 131.9 kg   SpO2 96%   BMI 40.56 kg/m?   ? ? ? ?PHYSICAL EXAM  ? ? ?GENERAL:NAD, no fevers, chills, no weakness no fatigue ?HEAD: Normocephalic, atraumatic.  ?EYES: Pupils  equal, round, reactive to light. Extraocular muscles intact. No scleral icterus.  ?MOUTH: Moist mucosal membrane. Dentition intact. No abscess noted.  ?EAR, NOSE, THROAT: Clear without exudates. No externa

## 2022-03-11 NOTE — Progress Notes (Signed)
?Progress Note ? ? ?Patient: Edwin Donovan. QVZ:[id number] DOB: Nov 02, 1946 DOA: 03/09/2022     2 ?DOS: the patient was seen and examined on 03/11/2022 ?  ?Brief hospital course: ?Dameion "Rush Landmark" Oaklan Persons. is a 76 y.o. male with past medical history COPD not on home oxygen prior to recent hospital d/c on 02/13/50, chronic diastolic CHF, hypertension and CKD stage II with hx or Right nephrectomy.  He presented to the ED early AM on 03/09/2022 with progressively worsening lower extremity edema, significant orthopnea, dyspnea worsening on exertion over the last couple weeks, in addition to worsening cough with wheezing.   ? ?ED Course -- RR 22, spO2 95% on 3 L/min [place] O2, otherwise stable vitals.  Labs showed AKI, mild anemia, mild thrombocytopenia, minimally elevated hs-troponin which trended flat. ? ?Imaging -- ?Chest x-ray - large masslike opacity in the left hilum. ?Chest CTA  - two isolated subsegmental pulmonary emboli at the bifurcation in the right upper lobe pulmonary artery. Overall clot burden is very small.    ?17 mm spiculated nodule in the posterior right upper lobe.  Associated 10.0 x 7.4 cm left mediastinal mass extending into the medial left upper lobe and left perihilar region. This appearance raises concern for primary bronchogenic neoplasm such as small cell lung cancer. ? ?Admitted to hospitalist service with pulmonology, oncology and palliative care consulted.  ? ? ?Assessment and Plan: ?* Acute pulmonary embolism ([place]) ?Continue heparin and transition to Eliquis after any procedures. ?Likely provoked by underlying malignancy. ? ?COPD with acute exacerbation ([place]) ?Presented with wheezing and increased O2 requirement.  Started on IV steroids on admission, transitioned to prednisone. ?--Continue prednisone ?--Duonebs and PRN albuterol nebs ?--Antitussives, mucolytics ?--Pulmonary hygiene ?--Supplement O2 PRN per protocol ? ?Acute on chronic diastolic CHF (congestive heart failure) ([place]) ?Echo  this admission on 4/22 technically limited study with preserved EF, but diastolic parameters indeterminate. ?Started on IV Lasix BID on admission. ?4/22: output 2850 cc ?Net IO Since Admission: -2,096.34 mL [03/10/22 1425] ?--Cardiology following ?--Continue IV lasix 40 mg BID ?--Continue Toprol-XL ?--Follow renal function, electrolytes ?--Strict I/O's, daily weights ? ? ?Nodule of upper lobe of right lung ?17 mm spiculated nodule in the posterior right upper lobe. ?Associated 10.0 x 7.4 cm left mediastinal mass extending into the ?medial left upper lobe and left perihilar region. This appearance ?raises concern for primary bronchogenic neoplasm such as small cell ?lung cancer. ?-Pulmonary, Oncology, Palliative Care are consulted ?--Cardiology consulted for risk assessment prior to bronchoscopy for biopsy ? ?Lupus erythematosus ?Noted in chart review of [place] records. ?No apparent acute issues. ? ?Prostate cancer ([place]) ?Noted.  Monitor for obstructive urinary symptoms.   ? ?History of renal carcinoma ?S/p right nephrectomy, now solitary left kidney.  Monitor renal function and urine output closely. ? ?AKI (acute kidney injury) ([place]) ?Suspect prerenal from acute CHF with secondary renal hypoperfusion. ?Renal function improving with diuresis. ?Monitor BMP. ?Avoid nephrotoxins, hypotension ?Maintain MAP>65 ?Renally dose meds ? ?Dyslipidemia ?Continue statin  ? ?Essential hypertension ?Continue Toprol-XL. ?Zestril was held on admission due to AKI.  Resume Zestril with renal function improving and BP elevated.  ? ? ? ? ?  ? ?Subjective: Pt awake resting in bed, wife at bedside.  He reports overall feeling well today.  Needed breathing treatment earlier.  Reports high urine output continues.   ? ?I called his [facility] PCP this AM and discussed case, plan, etc.  ? ?Physical Exam: ?Vitals:  ? 03/11/22 0909 03/11/22 0924 03/11/22 1131 03/11/22 1656  ?  BP:   (!) 157/88 (!) 152/89  ?Pulse: 79  77 73  ?Resp:   20 18  ?Temp: 97.7 ?F (36.5  ?C)  (!) 97.5 ?F (36.4 ?C) 97.7 ?F (36.5 ?C)  ?TempSrc:   Oral Oral  ?SpO2: 92% 94% 96% 94%  ?Weight:      ?Height:      ? ?General exam: awake, alert, no acute distress, obese ?HEENT: moist mucus membranes, hearing grossly normal  ?Respiratory system: CTAB diminished bases, no wheezes, normal respiratory effort. ?Cardiovascular system: normal S1/S2, RRR, nearly resolved LLE edema, RLE with edema proximal to [employer].   ?Gastrointestinal system: soft, NT, ND ?Central nervous system: A&O x3. no gross focal neurologic deficits, normal speech ?Extremities: moves all, normal tone ?Skin: dry, intact, normal temperature ?Psychiatry: normal mood, congruent affect, judgement and insight appear normal ? ? ?Data Reviewed: ? ?Labs notable for : glucose 106, BUN 53, Cr 1.52, GFR 47, WBC 11.7k, procal < 0.10 ? ?Family Communication: wife at bedside on rounds today ? ?Disposition: ?Status is: Inpatient ?Remains inpatient appropriate because: ongoing evaluation, bronchoscopy planned for biopsy, remains on IV diuretics ? ? Planned Discharge Destination: Home ? ? ? ?Time spent: 45 minutes ? ?Author: ?Ezekiel Slocumb, DO ?03/11/2022 5:49 PM ? ?For on call review [email or web address].  ?

## 2022-03-11 NOTE — Progress Notes (Signed)
Chaplain Maggie made initial visit with patient for Advanced Directive education. Paperwork was left with patient who expressed he was was feeling a bit overwhelmed. It was suggested pt read through the documents and complete form A and/or B at his own pace. Contact on call chaplain to coordinate signatures with notary and witnesses. ?

## 2022-03-11 NOTE — Consult Note (Signed)
? ?  ?Palliative Medicine ?[place] [facility] ?Telephone:([phone]) [phone] Fax:(336) [phone] ? ? ?Name: Edwin Donovan. ?Date: 03/11/2022 ?MRN: [id number]  ?DOB: 03-31-1946 ? ?Patient Care Team: ?System, Provider Not In as PCP - General ?Telford Nab, RN as Oncology Nurse Navigator  ? ? ?REASON FOR CONSULTATION: ?Edwin Derrig. is a 76 y.o. male with multiple medical problems including diastolic dysfunction with history of CHF, hypertension, CKD, and COPD on 3 L O2 for the past several weeks, history of prostate and urothelial cancer status post surgery, who was admitted to the hospital 03/09/2022 for management of acute PE after presenting with shortness of breath.  CTA of the chest showed 2 small subsegmental PEs the patient also found to have a 10 cm left mediastinal mass concerning for primary bronchogenic cancer.  Palliative care was consulted help address goals. ? ?SOCIAL HISTORY:    ? reports that he has been smoking cigarettes. He has been smoking an average of .5 packs per day. He has never used smokeless tobacco. He reports current alcohol use of about 14.0 standard drinks per week. He reports current drug use. Drug: Marijuana. ? ?Patient is a widower.  He lives at home alone.  He has a significant other, [name].  Patient had a daughter who died from head neck cancer.  Patient is a [place] veteran and served in the [place] [employer].  He later worked as a Water engineer. ? ?ADVANCE DIRECTIVES:  ?None ? ?CODE STATUS: Full code ? ?PAST MEDICAL HISTORY: ?Past Medical History:  ?Diagnosis Date  ? Anxiety   ? ptsd  ? Arthritis   ? Cancer [facility])   ? PROSTATE  ? CHF (congestive heart failure) ([place])   ? Chronic kidney disease   ? stones  ? COPD (chronic obstructive pulmonary disease) ([place])   ? Dysrhythmia   ? History of kidney stones   ? Hypertension   ? Kidney stone   ? Pneumonia   ? 06/27/16  ? Shortness of breath dyspnea   ? ? ?PAST SURGICAL HISTORY:  ?Past Surgical History:  ?Procedure  Laterality Date  ? CYSTOSCOPY W/ RETROGRADES Bilateral 12/26/2017  ? Procedure: CYSTOSCOPY WITH RETROGRADE PYELOGRAM;  Surgeon: Abbie Sons, MD;  Location: [facility] ORS;  Service: Urology;  Laterality: Bilateral;  ? CYSTOSCOPY W/ RETROGRADES Right 02/09/2019  ? Procedure: CYSTOSCOPY WITH RETROGRADE PYELOGRAM;  Surgeon: Abbie Sons, MD;  Location: [facility];  Service: Urology;  Laterality: Right;  ? CYSTOSCOPY W/ URETERAL STENT PLACEMENT Right 07/29/2016  ? Procedure: CYSTOSCOPY WITH STENT REPLACEMENT;  Surgeon: Hollice Espy, MD;  Location: [facility];  Service: Urology;  Laterality: Right;  ? CYSTOSCOPY WITH BIOPSY Right 07/29/2016  ? Procedure: CYSTOSCOPY WITH BIOPSY;  Surgeon: Hollice Espy, MD;  Location: [facility];  Service: Urology;  Laterality: Right;  ? CYSTOSCOPY WITH BIOPSY Right 02/09/2019  ? Procedure: CYSTOSCOPY WITH BIOPSY;  Surgeon: Abbie Sons, MD;  Location: [facility];  Service: Urology;  Laterality: Right;  ? CYSTOSCOPY WITH STENT PLACEMENT Right 06/28/2016  ? Procedure: CYSTOSCOPY WITH STENT PLACEMENT;  Surgeon: Hollice Espy, MD;  Location: [facility];  Service: Urology;  Laterality: Right;  ? CYSTOSCOPY WITH STENT PLACEMENT Right 02/09/2019  ? Procedure: CYSTOSCOPY WITH STENT PLACEMENT;  Surgeon: Abbie Sons, MD;  Location: [facility];  Service: Urology;  Laterality: Right;  ? CYSTOSCOPY/URETEROSCOPY/HOLMIUM LASER/STENT PLACEMENT Right 12/26/2017  ? Procedure: CYSTOSCOPY/URETEROSCOPY/HOLMIUM LASER/STENT PLACEMENT;  Surgeon: Abbie Sons, MD;  Location: [facility];  Service: Urology;  Laterality:  Right;  ? EXTRACORPOREAL SHOCK WAVE LITHOTRIPSY Right 12/04/2017  ? Procedure: EXTRACORPOREAL SHOCK WAVE LITHOTRIPSY (ESWL);  Surgeon: Hollice Espy, MD;  Location: [facility];  Service: Urology;  Laterality: Right;  ? PROSTATECTOMY    ? URETEROSCOPY Right 07/29/2016  ? Procedure: URETEROSCOPY;  Surgeon: Hollice Espy, MD;  Location: [facility];  Service: Urology;  Laterality: Right;  ? URETEROSCOPY Right  02/09/2019  ? Procedure: URETEROSCOPY- DIAGNOSTIC;  Surgeon: Abbie Sons, MD;  Location: [facility];  Service: Urology;  Laterality: Right;  ? ? ?HEMATOLOGY/ONCOLOGY HISTORY:  ?Oncology History  ?Urothelial carcinoma ([place])  ?02/28/2019 Initial Diagnosis  ? Urothelial carcinoma ([place]) ? ?  ?03/12/2019 -  Chemotherapy  ? The patient had palonosetron (ALOXI) injection 0.25 mg, 0.25 mg, Intravenous,  Once, 0 of 4 cycles ?CISplatin (PLATINOL) 176 mg in sodium chloride 0.9 % 500 mL chemo infusion, 70 mg/m2 = 176 mg, Intravenous,  Once, 0 of 4 cycles ?gemcitabine (GEMZAR) 2,508 mg in sodium chloride 0.9 % 250 mL chemo infusion, 1,000 mg/m2 = 2,508 mg, Intravenous,  Once, 0 of 4 cycles ?fosaprepitant (EMEND) 150 mg, dexamethasone (DECADRON) 12 mg in sodium chloride 0.9 % 145 mL IVPB, , Intravenous,  Once, 0 of 4 cycles ? ? for chemotherapy treatment.  ? ?  ? ? ?ALLERGIES:  has No Known Allergies. ? ?MEDICATIONS:  ?Current Facility-Administered Medications  ?Medication Dose Route Frequency Provider Last Rate Last Admin  ? acetaminophen (TYLENOL) tablet 650 mg  650 mg Oral Q6H PRN Mansy, Jan A, MD      ? Or  ? acetaminophen (TYLENOL) suppository 650 mg  650 mg Rectal Q6H PRN Mansy, Jan A, MD      ? albuterol (PROVENTIL) (2.5 MG/3ML) 0.083% nebulizer solution 3 mL  3 mL Inhalation Q4H PRN Nicole Kindred A, DO   3 mL at 03/11/22 1407  ? atorvastatin (LIPITOR) tablet 80 mg  80 mg Oral Daily Mansy, Jan A, MD   80 mg at 03/11/22 0959  ? benzonatate (TESSALON) capsule 200 mg  200 mg Oral TID Mansy, Jan A, MD   200 mg at 03/11/22 0092  ? bisacodyl (DULCOLAX) EC tablet 5 mg  5 mg Oral Daily PRN Nicole Kindred A, DO   5 mg at 03/09/22 1615  ? furosemide (LASIX) injection 40 mg  40 mg Intravenous Q12H Mansy, Jan A, MD   40 mg at 03/11/22 1003  ? heparin ADULT infusion 100 units/mL (25000 units/289mL)  1,600 Units/hr Intravenous Continuous Renda Rolls, RPH 16 mL/hr at 03/11/22 1537 1,600 Units/hr at 03/11/22 1537  ?  ipratropium-albuterol (DUONEB) 0.5-2.5 (3) MG/3ML nebulizer solution 3 mL  3 mL Nebulization Q6H PRN Ottie Glazier, MD   3 mL at 03/10/22 1517  ? loratadine (CLARITIN) tablet 10 mg  10 mg Oral Daily Mansy, Jan A, MD   10 mg at 03/11/22 3300  ? And  ? pseudoephedrine (SUDAFED) 12 hr tablet 120 mg  120 mg Oral BID Mansy, Jan A, MD   120 mg at 03/11/22 0957  ? metoprolol succinate (TOPROL-XL) 24 hr tablet 100 mg  100 mg Oral Daily Mansy, Jan A, MD   100 mg at 03/11/22 7622  ? polyethylene glycol (MIRALAX / GLYCOLAX) packet 17 g  17 g Oral Daily Nicole Kindred A, DO   17 g at 03/11/22 0957  ? predniSONE (DELTASONE) tablet 40 mg  40 mg Oral Q breakfast Mansy, Jan A, MD   40 mg at 03/11/22 1001  ? senna-docusate (Senokot-S) tablet 1 tablet  1 tablet Oral BID Nicole Kindred A, DO   1 tablet at 03/11/22 7408  ? traZODone (DESYREL) tablet 50 mg  50 mg Oral QHS PRN Mansy, Jan A, MD   50 mg at 03/10/22 2233  ? ? ?VITAL SIGNS: ?BP (!) 157/88 (BP Location: Left Arm)   Pulse 77   Temp (!) 97.5 ?F (36.4 ?C) (Oral)   Resp 20   Ht 5\' 11"  (1.803 m)   Wt 290 lb 12.6 oz (131.9 kg)   SpO2 96%   BMI 40.56 kg/m?  ?Filed Weights  ? 03/09/22 1658 03/10/22 0431 03/11/22 0358  ?Weight: 292 lb 9.6 oz (132.7 kg) 291 lb 3.6 oz (132.1 kg) 290 lb 12.6 oz (131.9 kg)  ?  ?Estimated body mass index is 40.56 kg/m? as calculated from the following: ?  Height as of this encounter: 5\' 11"  (1.803 m). ?  Weight as of this encounter: 290 lb 12.6 oz (131.9 kg). ? ?LABS: ?CBC: ?   ?Component Value Date/Time  ? WBC 10.4 03/11/2022 0356  ? HGB 11.7 (L) 03/11/2022 0356  ? HCT 36.4 (L) 03/11/2022 0356  ? PLT 162 03/11/2022 0356  ? MCV 95.3 03/11/2022 0356  ? NEUTROABS 6.4 02/27/2022 0023  ? LYMPHSABS 0.8 02/27/2022 0023  ? MONOABS 0.5 02/27/2022 0023  ? EOSABS 0.2 02/27/2022 0023  ? BASOSABS 0.0 02/27/2022 0023  ? ?Comprehensive Metabolic Panel: ?   ?Component Value Date/Time  ? NA 139 03/11/2022 0356  ? K 4.3 03/11/2022 0356  ? CL 101 03/11/2022 0356   ? CO2 31 03/11/2022 0356  ? BUN 53 (H) 03/11/2022 0356  ? BUN 18 10/21/2016 1614  ? CREATININE 1.52 (H) 03/11/2022 0356  ? GLUCOSE 106 (H) 03/11/2022 0356  ? CALCIUM 9.4 03/11/2022 0356  ? AST 17 03/09/2022 1258  ? ALT 1

## 2022-03-11 NOTE — Progress Notes (Signed)
ANTICOAGULATION CONSULT NOTE ? ?Pharmacy Consult for heparin infusion ?Indication: pulmonary embolus ? ?No Known Allergies ? ?Patient Measurements: ?Height: 5\' 11"  (180.3 cm) ?Weight: 131.9 kg (290 lb 12.6 oz) ?IBW/kg (Calculated) : 75.3 ?Heparin Dosing Weight: 108.3 kg ? ?Vital Signs: ?Temp: 97.6 ?F (36.4 ?C) (04/24 0353) ?Temp Source: Axillary (04/24 0353) ?BP: 150/92 (04/24 0353) ?Pulse Rate: 74 (04/24 0353) ? ?Labs: ?Recent Labs  ?  03/09/22 ?0003 03/09/22 ?0201 03/09/22 ?0305 03/09/22 ?1258 03/10/22 ?3762 03/10/22 ?1400 03/10/22 ?2214 03/11/22 ?0356  ?HGB 11.7*  --   --   --  11.4*  --   --  11.7*  ?HCT 38.5*  --   --   --  36.2*  --   --  36.4*  ?PLT 140*  --   --   --  143*  --   --  162  ?APTT  --   --  35  --   --   --   --   --   ?LABPROT  --   --  13.9  --   --   --   --   --   ?INR  --   --  1.1  --   --   --   --   --   ?HEPARINUNFRC  --   --   --    < > 0.72* 0.69 0.65 0.59  ?CREATININE 1.62*  --   --   --  1.54*  --   --  1.52*  ?TROPONINIHS 30* 32*  --   --   --   --   --   --   ? < > = values in this interval not displayed.  ? ? ? ?Estimated Creatinine Clearance: 56.4 mL/min (A) (by C-G formula based on SCr of 1.52 mg/dL (H)). ? ? ?Medical History: ?Past Medical History:  ?Diagnosis Date  ? Anxiety   ? ptsd  ? Arthritis   ? Cancer [facility])   ? PROSTATE  ? CHF (congestive heart failure) ([place])   ? Chronic kidney disease   ? stones  ? COPD (chronic obstructive pulmonary disease) ([place])   ? Dysrhythmia   ? History of kidney stones   ? Hypertension   ? Kidney stone   ? Pneumonia   ? 06/27/16  ? Shortness of breath dyspnea   ? ? ?Assessment: ?Pt is 76 yo male presenting to ED w/ SOB and leg edema found with "two isolated subsegmental pulmonary emboli at the bifurcation in the right upper lobe pulmonary artery." ? ?4/22 1258 HL 0.52 ?4/22 2118 HL 0.66 ?4/23 0452 HL 0.72, supratherapeutic ?4/23 1400 HL 0.69  ?4/23 2214 HL 0.65 ?4/24 0356 HL 0.59 ? ?Goal of Therapy:  ?Heparin level 0.3-0.7 units/ml ?Monitor  platelets by anticoagulation protocol: Yes ?  ?Plan:  ?Heparin level remains therapeutic.  ?Continue heparin infusion to 1600 units/hr.  ?Recheck HL daily w/ AM labs while therapeutic.  ?CBC daily while on heparin.  ? ?Renda Rolls, PharmD, MBA ?03/11/2022 ?5:20 AM ? ? ? ? ? ?

## 2022-03-11 NOTE — Progress Notes (Signed)
Per Dr. Janese Banks, pt needs outpatient PET scan for lung mass staging. Orders placed and will start insurance authorization process while awaiting discharge. Once pt discharged, will schedule pt for PET and notify him with the appt.  ?

## 2022-03-11 NOTE — Consult Note (Signed)
WOC Nurse Consult Note: ?Patient receiving care in [facility] ?Reason for Consult:RLE wound ?Wound type: Compression wrap to RLE removed. NO wound found. ?Pressure Injury POA: Yes/No/NA ?Measurement: ?Wound bed: ?Drainage (amount, consistency, odor)  ?Periwound: ?Dressing procedure/placement/frequency: ?Unna boot placed to RLE. Will change again Thursday if patient remains in hospital. ? ?Val Riles, RN, MSN, CWOCN, CNS-BC, pager [phone]  ?

## 2022-03-11 NOTE — Progress Notes (Signed)
Per [facility] Auth# [id number] valid 03/11/22-04/822 for [id number] ?

## 2022-03-12 DIAGNOSIS — R911 Solitary pulmonary nodule: Secondary | ICD-10-CM | POA: Diagnosis not present

## 2022-03-12 DIAGNOSIS — E66813 Obesity, class 3: Secondary | ICD-10-CM | POA: Diagnosis present

## 2022-03-12 DIAGNOSIS — I2699 Other pulmonary embolism without acute cor pulmonale: Secondary | ICD-10-CM | POA: Diagnosis not present

## 2022-03-12 DIAGNOSIS — N179 Acute kidney failure, unspecified: Secondary | ICD-10-CM | POA: Diagnosis not present

## 2022-03-12 DIAGNOSIS — J9859 Other diseases of mediastinum, not elsewhere classified: Secondary | ICD-10-CM | POA: Diagnosis not present

## 2022-03-12 LAB — HEPARIN LEVEL (UNFRACTIONATED): Heparin Unfractionated: 0.58 IU/mL (ref 0.30–0.70)

## 2022-03-12 LAB — CBC
HCT: 37.6 % — ABNORMAL LOW (ref 39.0–52.0)
Hemoglobin: 12.2 g/dL — ABNORMAL LOW (ref 13.0–17.0)
MCH: 30.5 pg (ref 26.0–34.0)
MCHC: 32.4 g/dL (ref 30.0–36.0)
MCV: 94 fL (ref 80.0–100.0)
Platelets: 158 10*3/uL (ref 150–400)
RBC: 4 MIL/uL — ABNORMAL LOW (ref 4.22–5.81)
RDW: 14.1 % (ref 11.5–15.5)
WBC: 9.4 10*3/uL (ref 4.0–10.5)
nRBC: 0 % (ref 0.0–0.2)

## 2022-03-12 LAB — BASIC METABOLIC PANEL
Anion gap: 6 (ref 5–15)
BUN: 57 mg/dL — ABNORMAL HIGH (ref 8–23)
CO2: 32 mmol/L (ref 22–32)
Calcium: 9.3 mg/dL (ref 8.9–10.3)
Chloride: 98 mmol/L (ref 98–111)
Creatinine, Ser: 1.56 mg/dL — ABNORMAL HIGH (ref 0.61–1.24)
GFR, Estimated: 45 mL/min — ABNORMAL LOW (ref >60)
Glucose, Bld: 105 mg/dL — ABNORMAL HIGH (ref 70–99)
Potassium: 4.4 mmol/L (ref 3.5–5.1)
Sodium: 136 mmol/L (ref 135–145)

## 2022-03-12 MED ORDER — APIXABAN 5 MG PO TABS: 5.0000 mg | ORAL_TABLET | Freq: Two times a day (BID) | ORAL | Status: DC

## 2022-03-12 MED ORDER — PREDNISONE 10 MG PO TABS: ORAL_TABLET | ORAL | 0 refills | Status: DC

## 2022-03-12 MED ORDER — APIXABAN 5 MG PO TABS
10.0000 mg | ORAL_TABLET | Freq: Two times a day (BID) | ORAL | Status: DC
  Administered 2022-03-12 – 2022-03-13 (×3): 10 mg via ORAL
  Filled 2022-03-12 (×3): qty 2

## 2022-03-12 MED ORDER — FUROSEMIDE 40 MG PO TABS
40.0000 mg | ORAL_TABLET | Freq: Two times a day (BID) | ORAL | Status: DC
  Administered 2022-03-12 – 2022-03-13 (×2): 40 mg via ORAL
  Filled 2022-03-12 (×2): qty 1

## 2022-03-12 MED ORDER — AZITHROMYCIN 250 MG PO TABS: ORAL_TABLET | ORAL | 0 refills | Status: DC

## 2022-03-12 MED ORDER — APIXABAN (ELIQUIS) VTE STARTER PACK (10MG AND 5MG): ORAL_TABLET | ORAL | 0 refills | Status: DC

## 2022-03-12 MED ORDER — CARVEDILOL 12.5 MG PO TABS
12.5000 mg | ORAL_TABLET | Freq: Two times a day (BID) | ORAL | Status: DC
  Administered 2022-03-12 – 2022-03-14 (×5): 12.5 mg via ORAL
  Filled 2022-03-12 (×5): qty 1

## 2022-03-12 MED ORDER — AZITHROMYCIN 250 MG PO TABS
250.0000 mg | ORAL_TABLET | Freq: Every day | ORAL | Status: AC
  Administered 2022-03-12 – 2022-03-15 (×4): 250 mg via ORAL
  Filled 2022-03-12 (×5): qty 1

## 2022-03-12 NOTE — Assessment & Plan Note (Signed)
Body mass index is 40.53 kg/m?Marland Kitchen ?Complicates overall care and prognosis.  Recommend lifestyle modifications including physical activity and diet for weight loss and overall long-term health. ?

## 2022-03-12 NOTE — Progress Notes (Signed)
? ? ? ?PULMONOLOGY ? ? ? ? ? ? ? ? ?Date: 03/12/2022,   ?MRN# [id number] Edwin Donovan. 1946-06-08 ? ? ?  ?AdmissionWeight: (!) 141.5 kg                 ?CurrentWeight: 131.8 kg ? ?Referring provider: Dr. Arbutus Ped ? ? ?CHIEF COMPLAINT:  ? ?Nonmassive PE with RUL nodule and mediastinal lymphadenopathy ? ? ?HISTORY OF PRESENT ILLNESS  ? ?This is a pleasant 76 year old male with a history of prostate CA, disorder, arthritis, CHF, CKD COPD, dysrhythmia history of nephrolithiasis and essential previous episode of pneumonia in 2017, he reports worsening cough and wheezing.  He was found to be mildly tachypneic on arrival and required 3 L of supplemental oxygen to reach normoxia at SPO2 95%.  Labs showed CKD however troponins were essentially normal in the context of CKD with only mild elevation.  Influenza and COVID-19 are negative.  CTA was performed with findings of subsegmental pulmonary emboli as well as 17 mm spiculated nodule of the right upper lobe and associated mediastinal lymphadenopathy/mass.  He had nephrectomy in the past due to renal cell carcinoma.  He quit smoking 2 years ago. PCCM consultation was placed for additional evaluation and management.  ? ?03/12/22- patient is stable for DC home on 2L/min [place]. Will order PT/OT for dc planning. Reviewed medical plan with Attending physician Dr Arbutus Ped. Plan for outpatient bronchoscopy due to delay in scheduling. Will transition to pO eliquis with pharmacy consult.  Prednisone is at 40 mg please taper by 5mg  daily to zero. He can have zithromax 250mg  for 5d upon dc due to COPD. Continue home COPD meds. Will follow up in clinic.  ? ?PAST MEDICAL HISTORY  ? ?Past Medical History:  ?Diagnosis Date  ?? Anxiety   ? ptsd  ?? Arthritis   ?? Cancer [facility])   ? PROSTATE  ?? CHF (congestive heart failure) ([place])   ?? Chronic kidney disease   ? stones  ?? COPD (chronic obstructive pulmonary disease) ([place])   ?? Dysrhythmia   ?? History of kidney stones   ?? Hypertension    ?? Kidney stone   ?? Pneumonia   ? 06/27/16  ?? Shortness of breath dyspnea   ? ? ? ?SURGICAL HISTORY  ? ?Past Surgical History:  ?Procedure Laterality Date  ?? CYSTOSCOPY W/ RETROGRADES Bilateral 12/26/2017  ? Procedure: CYSTOSCOPY WITH RETROGRADE PYELOGRAM;  Surgeon: Abbie Sons, MD;  Location: [facility];  Service: Urology;  Laterality: Bilateral;  ?? CYSTOSCOPY W/ RETROGRADES Right 02/09/2019  ? Procedure: CYSTOSCOPY WITH RETROGRADE PYELOGRAM;  Surgeon: Abbie Sons, MD;  Location: [facility];  Service: Urology;  Laterality: Right;  ?? CYSTOSCOPY W/ URETERAL STENT PLACEMENT Right 07/29/2016  ? Procedure: CYSTOSCOPY WITH STENT REPLACEMENT;  Surgeon: Hollice Espy, MD;  Location: [facility];  Service: Urology;  Laterality: Right;  ?? CYSTOSCOPY WITH BIOPSY Right 07/29/2016  ? Procedure: CYSTOSCOPY WITH BIOPSY;  Surgeon: Hollice Espy, MD;  Location: [facility];  Service: Urology;  Laterality: Right;  ?? CYSTOSCOPY WITH BIOPSY Right 02/09/2019  ? Procedure: CYSTOSCOPY WITH BIOPSY;  Surgeon: Abbie Sons, MD;  Location: [facility];  Service: Urology;  Laterality: Right;  ?? CYSTOSCOPY WITH STENT PLACEMENT Right 06/28/2016  ? Procedure: CYSTOSCOPY WITH STENT PLACEMENT;  Surgeon: Hollice Espy, MD;  Location: [facility];  Service: Urology;  Laterality: Right;  ?? CYSTOSCOPY WITH STENT PLACEMENT Right 02/09/2019  ? Procedure: CYSTOSCOPY WITH STENT PLACEMENT;  Surgeon: Abbie Sons, MD;  Location: [facility]  ORS;  Service: Urology;  Laterality: Right;  ?? CYSTOSCOPY/URETEROSCOPY/HOLMIUM LASER/STENT PLACEMENT Right 12/26/2017  ? Procedure: CYSTOSCOPY/URETEROSCOPY/HOLMIUM LASER/STENT PLACEMENT;  Surgeon: Abbie Sons, MD;  Location: [facility];  Service: Urology;  Laterality: Right;  ?? EXTRACORPOREAL SHOCK WAVE LITHOTRIPSY Right 12/04/2017  ? Procedure: EXTRACORPOREAL SHOCK WAVE LITHOTRIPSY (ESWL);  Surgeon: Hollice Espy, MD;  Location: [facility] OR[facility];  Service: Urology;  Laterality: Right;  ?? PROSTATECTOMY    ?? URETEROSCOPY  Right 07/29/2016  ? Procedure: URETEROSCOPY;  Surgeon: Hollice Espy, MD;  Location: [facility];  Service: Urology;  Laterality: Right;  ?? URETEROSCOPY Right 02/09/2019  ? Procedure: URETEROSCOPY- DIAGNOSTIC;  Surgeon: Abbie Sons, MD;  Location: [facility] ORS;  Service: Urology;  Laterality: Right;  ? ? ? ?FAMILY HISTORY  ? ?Family History  ?Problem Relation Age of Onset  ?? Stroke Mother   ?? Heart attack Father   ?? Lung cancer Father   ?? Kidney disease Father   ?? Prostate cancer Neg Hx   ?? Bladder Cancer Neg Hx   ? ? ? ?SOCIAL HISTORY  ? ?Social History  ? ?Tobacco Use  ?? Smoking status: Some Days  ?  Packs/day: 0.50  ?  Types: Cigarettes  ?? Smokeless tobacco: Never  ?Vaping Use  ?? Vaping Use: Never used  ?Substance Use Topics  ?? Alcohol use: Yes  ?  Alcohol/week: 14.0 standard drinks  ?  Types: 14 Cans of beer per week  ?? Drug use: Yes  ?  Types: Marijuana  ?  Comment: "sometimes, not very often"  ? ? ? ?MEDICATIONS  ? ? ?Home Medication:  ? ?  ?Current Medication: ? ?Current Facility-Administered Medications:  ??  acetaminophen (TYLENOL) tablet 650 mg, 650 mg, Oral, Q6H PRN, 650 mg at 03/12/22 0431 **OR** acetaminophen (TYLENOL) suppository 650 mg, 650 mg, Rectal, Q6H PRN, Mansy, Jan A, MD ??  albuterol (PROVENTIL) (2.5 MG/3ML) 0.083% nebulizer solution 3 mL, 3 mL, Inhalation, Q4H PRN, Nicole Kindred A, DO, 3 mL at 03/12/22 0431 ??  atorvastatin (LIPITOR) tablet 80 mg, 80 mg, Oral, Daily, Mansy, Jan A, MD, 80 mg at 03/11/22 3557 ??  benzonatate (TESSALON) capsule 200 mg, 200 mg, Oral, TID, Mansy, Jan A, MD, 200 mg at 03/11/22 2200 ??  bisacodyl (DULCOLAX) EC tablet 5 mg, 5 mg, Oral, Daily PRN, Nicole Kindred A, DO, 5 mg at 03/09/22 1615 ??  furosemide (LASIX) injection 40 mg, 40 mg, Intravenous, Q12H, Mansy, Jan A, MD, 40 mg at 03/11/22 2200 ??  heparin ADULT infusion 100 units/mL (25000 units/263mL), 1,600 Units/hr, Intravenous, Continuous, Belue, Alver Sorrow, RPH, Last Rate: 16 mL/hr at 03/11/22 1737,  1,600 Units/hr at 03/11/22 1737 ??  ipratropium-albuterol (DUONEB) 0.5-2.5 (3) MG/3ML nebulizer solution 3 mL, 3 mL, Nebulization, Q6H PRN, Ottie Glazier, MD, 3 mL at 03/10/22 1517 ??  loratadine (CLARITIN) tablet 10 mg, 10 mg, Oral, Daily, 10 mg at 03/11/22 0959 **AND** pseudoephedrine (SUDAFED) 12 hr tablet 120 mg, 120 mg, Oral, BID, Mansy, Jan A, MD, 120 mg at 03/11/22 2200 ??  metoprolol succinate (TOPROL-XL) 24 hr tablet 100 mg, 100 mg, Oral, Daily, Mansy, Jan A, MD, 100 mg at 03/11/22 3220 ??  polyethylene glycol (MIRALAX / GLYCOLAX) packet 17 g, 17 g, Oral, Daily, Nicole Kindred A, DO, 17 g at 03/11/22 0957 ??  [EXPIRED] methylPREDNISolone sodium succinate (SOLU-MEDROL) 40 mg/mL injection 40 mg, 40 mg, Intravenous, Q12H, 40 mg at 03/09/22 2046 **FOLLOWED BY** predniSONE (DELTASONE) tablet 40 mg, 40 mg, Oral, Q breakfast, Mansy, Jan A, MD, 40 mg  at 03/11/22 1001 ??  senna-docusate (Senokot-S) tablet 1 tablet, 1 tablet, Oral, BID, Nicole Kindred A, DO, 1 tablet at 03/11/22 2200 ??  traZODone (DESYREL) tablet 50 mg, 50 mg, Oral, QHS PRN, Mansy, Jan A, MD, 50 mg at 03/10/22 2233 ? ? ? ?ALLERGIES  ? ?Patient has no known allergies. ? ? ? ? ?REVIEW OF SYSTEMS  ? ? ?Review of Systems: ? ?Gen:  Denies  fever, sweats, chills weigh loss  ?HEENT: Denies blurred vision, double vision, ear pain, eye pain, hearing loss, nose bleeds, sore throat ?Cardiac:  No dizziness, chest pain or heaviness, chest tightness,edema ?Resp:   reports dyspnea chronically  ?Gi: Denies swallowing difficulty, stomach pain, nausea or vomiting, diarrhea, constipation, bowel incontinence ?Gu:  Denies bladder incontinence, burning urine ?Ext:   Denies Joint pain, stiffness or swelling ?Skin: Denies  skin rash, easy bruising or bleeding or hives ?Endoc:  Denies polyuria, polydipsia , polyphagia or weight change ?Psych:   Denies depression, insomnia or hallucinations  ? ?Other:  All other systems negative ? ? ?VS: BP (!) 165/95 (BP Location: Left  Arm)   Pulse 72   Temp 97.8 ?F (36.6 ?C) (Oral)   Resp (!) 21   Ht 5\' 11"  (1.803 m)   Wt 131.8 kg   SpO2 96%   BMI 40.53 kg/m?   ? ? ? ?PHYSICAL EXAM  ? ? ?GENERAL:NAD, no fevers, chills, no weakness no fat

## 2022-03-12 NOTE — Progress Notes (Addendum)
?Progress Note ? ? ?Patient: Edwin Donovan. ZOX:[id number] DOB: 1946/07/04 DOA: 03/09/2022     3 ?DOS: the patient was seen and examined on 03/12/2022 ?  ?Brief hospital course: ?Edwin Donovan. is a 76 y.o. male with past medical history COPD not on home oxygen prior to recent hospital d/c on 06/28/90, chronic diastolic CHF, hypertension and CKD stage II with hx or Right nephrectomy.  He presented to the ED early AM on 03/09/2022 with progressively worsening lower extremity edema, significant orthopnea, dyspnea worsening on exertion over the last couple weeks, in addition to worsening cough with wheezing.   ? ?ED Course -- RR 22, spO2 95% on 3 L/min [place] O2, otherwise stable vitals.  Labs showed AKI, mild anemia, mild thrombocytopenia, minimally elevated hs-troponin which trended flat. ? ?Imaging -- ?Chest x-ray - large masslike opacity in the left hilum. ?Chest CTA  - two isolated subsegmental pulmonary emboli at the bifurcation in the right upper lobe pulmonary artery. Overall clot burden is very small.    ?17 mm spiculated nodule in the posterior right upper lobe.  Associated 10.0 x 7.4 cm left mediastinal mass extending into the medial left upper lobe and left perihilar region. This appearance raises concern for primary bronchogenic neoplasm such as small cell lung cancer. ? ?Admitted to hospitalist service with pulmonology, oncology and palliative care consulted.  ? ? ?Assessment and Plan: ?* Acute pulmonary embolism ([place]) ?Continue heparin and transition to Eliquis after any procedures. ?Likely provoked by underlying malignancy. ? ?COPD with acute exacerbation ([place]) ?Presented with wheezing and increased O2 requirement.  Started on IV steroids on admission, transitioned to prednisone. ?--Continue prednisone & taper by 5 mg daily until zero, per Dr. Loni Muse pulm ?--5 days Zithromax 250 mg ?--Duonebs and PRN albuterol nebs ?--Antitussives, mucolytics ?--Pulmonary hygiene ?--Supplement O2 PRN per  protocol ? ?Acute on chronic diastolic CHF (congestive heart failure) ([place]) ?Echo this admission on 4/22 technically limited study with preserved EF, but diastolic parameters indeterminate. ?Started on IV Lasix BID on admission. ?4/25: output yesterday 3700 mL, LE edema resolved, renal fx plateau ?Net IO Since Admission: -7,193.85 mL [03/12/22 1701] ?--Cardiology following ?--Transition IV lasix 40 mg BID >> PO Lasix 40 mg ID ?--Toprol-XL 100 mg -- prescribe at d/c ?--Substituting Coreg for Toprol due to BP's elevated with ACEI on hold (AKI).  Can resume metop once ACEI resumed.   ?--Follow renal function, electrolytes ?--Strict I/O's, daily weights ? ? ?Nodule of upper lobe of right lung ?17 mm spiculated nodule in the posterior right upper lobe. ?Associated 10.0 x 7.4 cm left mediastinal mass extending into the ?medial left upper lobe and left perihilar region. This appearance ?raises concern for primary bronchogenic neoplasm such as small cell ?lung cancer. ?-Pulmonary, Oncology, Palliative Care are consulted ?--Cardiology cleared for bronchoscopy to biopsy ?--Bronchoscopy EBUS planned Friday ? ?Obesity, Class III, BMI 40-49.9 (morbid obesity) ([place]) ?Body mass index is 40.53 kg/m?Marland Kitchen ?Complicates overall care and prognosis.  Recommend lifestyle modifications including physical activity and diet for weight loss and overall long-term health. ? ?Lupus erythematosus ?Noted in chart review of [facility] records. ?No apparent acute issues. ? ?Prostate cancer ([place]) ?Noted.  Monitor for obstructive urinary symptoms.   ? ?History of renal carcinoma ?S/p right nephrectomy, now solitary left kidney.  Monitor renal function and urine output closely. ? ?AKI (acute kidney injury) ([place]) ?Suspect prerenal from acute CHF with secondary renal hypoperfusion. ?Renal function improving with diuresis. ?Monitor BMP. ?Avoid nephrotoxins, hypotension ?Maintain MAP>65 ?Renally dose meds ? ?  Dyslipidemia ?Continue statin  ? ?Essential  hypertension ?Using Coreg in place of Toprol-XL (while ACEI held due to AKI, BP's elevated).   ?Zestril held due to AKI.   ?Resume Zestril with renal function improves. ?Adjust antihypertensives PRN. ? ? ? ? ?  ? ?Subjective: Pt seen with significant other Edwin Donovan at bedside today.  He reports being short of breath earlier improved after nebulizer treatment.  Leg swelling has resolved and legs feeling better.  Still reports high urine output with diuresis.  No other acute complaints at this time. ? ?Physical Exam: ?Vitals:  ? 03/12/22 0418 03/12/22 0900 03/12/22 1121 03/12/22 1616  ?BP:  (!) 167/89 (!) 145/98 (!) 145/81  ?Pulse:  72 80 73  ?Resp:  18 18 18   ?Temp:  (!) 97.4 ?F (36.3 ?C) 97.6 ?F (36.4 ?C) 97.7 ?F (36.5 ?C)  ?TempSrc:  Oral Oral Oral  ?SpO2:  93% 91% 92%  ?Weight: 131.8 kg     ?Height:      ? ?General exam: awake, alert, no acute distress, obese, chronically ill-appearing ?HEENT: moist mucus membranes, hearing grossly normal  ?Respiratory system: CTAB but diminished throughout, no wheezes, rales or rhonchi, normal respiratory effort, on 2 L/min [place] O2. ?Cardiovascular system: normal S1/S2, RRR, resolved lower extremity edema.  ?Central nervous system: A&O x 4. no gross focal neurologic deficits, normal speech ?Extremities: Unna boot on right lower extremity, bilateral lower extremity edema has resolved ?Skin: dry, intact, normal temperature ?Psychiatry: normal mood, congruent affect, judgement and insight appear normal ? ? ? ?Data Reviewed: ? ?Notable labs: Creatinine stable 1.56, glucose 105, BUN 57, hemoglobin 12.2 ? ?Family Communication: Significant other at bedside on rounds today ? ?Disposition: ?Status is: Inpatient ?Remains inpatient appropriate because: Ongoing diagnostic evaluation not appropriate for outpatient setting, monitoring renal function closely with persistent AKI and solitary kidney, adjusting diuretics ? ? Planned Discharge Destination: Skilled nursing facility ? ? ? ?Time spent: 45  minutes ? ?Author: ?Ezekiel Slocumb, DO ?03/12/2022 8:32 PM ? ?For on call review [email or web address].  ?

## 2022-03-12 NOTE — Progress Notes (Signed)
ANTICOAGULATION CONSULT NOTE ? ?Pharmacy Consult for heparin infusion ?Indication: pulmonary embolus ? ?No Known Allergies ? ?Patient Measurements: ?Height: 5\' 11"  (180.3 cm) ?Weight: 131.8 kg (290 lb 9.1 oz) ?IBW/kg (Calculated) : 75.3 ?Heparin Dosing Weight: 108.3 kg ? ?Vital Signs: ?Temp: 97.8 ?F (36.6 ?C) (04/25 0413) ?Temp Source: Oral (04/25 0413) ?BP: 165/95 (04/25 0413) ?Pulse Rate: 72 (04/25 0413) ? ?Labs: ?Recent Labs  ?  03/10/22 ?0452 03/10/22 ?1400 03/10/22 ?2214 03/11/22 ?0356 03/12/22 ?9470  ?HGB 11.4*  --   --  11.7* 12.2*  ?HCT 36.2*  --   --  36.4* 37.6*  ?PLT 143*  --   --  162 158  ?HEPARINUNFRC 0.72*   < > 0.65 0.59 0.58  ?CREATININE 1.54*  --   --  1.52* 1.56*  ? < > = values in this interval not displayed.  ? ? ? ?Estimated Creatinine Clearance: 54.9 mL/min (A) (by C-G formula based on SCr of 1.56 mg/dL (H)). ? ? ?Medical History: ?Past Medical History:  ?Diagnosis Date  ? Anxiety   ? ptsd  ? Arthritis   ? Cancer [facility])   ? PROSTATE  ? CHF (congestive heart failure) ([place])   ? Chronic kidney disease   ? stones  ? COPD (chronic obstructive pulmonary disease) ([place])   ? Dysrhythmia   ? History of kidney stones   ? Hypertension   ? Kidney stone   ? Pneumonia   ? 06/27/16  ? Shortness of breath dyspnea   ? ? ?Assessment: ?Pt is 76 yo male presenting to ED w/ SOB and leg edema found with "two isolated subsegmental pulmonary emboli at the bifurcation in the right upper lobe pulmonary artery." ? ?4/22 1258 HL 0.52 ?4/22 2118 HL 0.66 ?4/23 0452 HL 0.72, supratherapeutic ?4/23 1400 HL 0.69  ?4/23 2214 HL 0.65 ?4/24 0356 HL 0.59 ?4/25 0506 HL 0.58, therapeutic X 4  ? ?Goal of Therapy:  ?Heparin level 0.3-0.7 units/ml ?Monitor platelets by anticoagulation protocol: Yes ?  ?Plan:  ?4/25:  HL @ 0506 = 0.58, therapeutic X 4 ?Will continue pt on current rate and recheck HL on 4/26 with AM labs.  ? ?Tamel Abel D ?03/12/2022 ?6:37 AM ? ? ? ? ? ?

## 2022-03-12 NOTE — Progress Notes (Signed)
ANTICOAGULATION CONSULT NOTE ? ?Pharmacy Consult for Eliquis ?Indication: pulmonary embolus ? ?No Known Allergies ? ?Patient Measurements: ?Height: 5\' 11"  (180.3 cm) ?Weight: 131.8 kg (290 lb 9.1 oz) ?IBW/kg (Calculated) : 75.3 ?Heparin Dosing Weight: 108.3 kg ? ?Vital Signs: ?Temp: 97.8 ?F (36.6 ?C) (04/25 0413) ?Temp Source: Oral (04/25 0413) ?BP: 165/95 (04/25 0413) ?Pulse Rate: 72 (04/25 0413) ? ?Labs: ?Recent Labs  ?  03/10/22 ?0452 03/10/22 ?1400 03/10/22 ?2214 03/11/22 ?0356 03/12/22 ?2992  ?HGB 11.4*  --   --  11.7* 12.2*  ?HCT 36.2*  --   --  36.4* 37.6*  ?PLT 143*  --   --  162 158  ?HEPARINUNFRC 0.72*   < > 0.65 0.59 0.58  ?CREATININE 1.54*  --   --  1.52* 1.56*  ? < > = values in this interval not displayed.  ? ? ? ?Estimated Creatinine Clearance: 54.9 mL/min (A) (by C-G formula based on SCr of 1.56 mg/dL (H)). ? ? ?Medical History: ?Past Medical History:  ?Diagnosis Date  ? Anxiety   ? ptsd  ? Arthritis   ? Cancer [facility])   ? PROSTATE  ? CHF (congestive heart failure) ([place])   ? Chronic kidney disease   ? stones  ? COPD (chronic obstructive pulmonary disease) ([place])   ? Dysrhythmia   ? History of kidney stones   ? Hypertension   ? Kidney stone   ? Pneumonia   ? 06/27/16  ? Shortness of breath dyspnea   ? ? ?Assessment: ?Pt is 76 yo male presenting to ED w/ SOB and leg edema found with "two isolated subsegmental pulmonary emboli at the bifurcation in the right upper lobe pulmonary artery." Pharmacy consulted to transition patient from heparin to apixaban.  ? ? ?Goal of Therapy:  ?Monitor platelets by anticoagulation protocol: Yes ?  ?Plan:  ?Stop heparin drip and start apixaban 10 mg BID x 7 days followed by 5 mg BID thereafter ?CBC per protocol ? ?Edwin Donovan O Edwin Donovan ?03/12/2022 ?7:35 AM ? ? ? ? ? ?

## 2022-03-12 NOTE — Care Management Important Message (Signed)
Important Message ? ?Patient Details  ?Name: Edwin Donovan. ?MRN: [id number] ?Date of Birth: [birth year] ? ? ?Medicare Important Message Given:  Yes ? ? ? ? ?[name] ?03/12/2022, 11:07 AM ?

## 2022-03-13 ENCOUNTER — Ambulatory Visit: Payer: Medicare Other | Admitting: Family

## 2022-03-13 ENCOUNTER — Encounter: Payer: Self-pay | Admitting: Family Medicine

## 2022-03-13 DIAGNOSIS — I2699 Other pulmonary embolism without acute cor pulmonale: Secondary | ICD-10-CM | POA: Diagnosis not present

## 2022-03-13 LAB — BASIC METABOLIC PANEL
Anion gap: 7 (ref 5–15)
BUN: 54 mg/dL — ABNORMAL HIGH (ref 8–23)
CO2: 31 mmol/L (ref 22–32)
Calcium: 9.2 mg/dL (ref 8.9–10.3)
Chloride: 96 mmol/L — ABNORMAL LOW (ref 98–111)
Creatinine, Ser: 1.51 mg/dL — ABNORMAL HIGH (ref 0.61–1.24)
GFR, Estimated: 47 mL/min — ABNORMAL LOW (ref >60)
Glucose, Bld: 103 mg/dL — ABNORMAL HIGH (ref 70–99)
Potassium: 4.3 mmol/L (ref 3.5–5.1)
Sodium: 134 mmol/L — ABNORMAL LOW (ref 135–145)

## 2022-03-13 LAB — MAGNESIUM: Magnesium: 2.2 mg/dL (ref 1.7–2.4)

## 2022-03-13 MED ORDER — MUPIROCIN 2 % EX OINT
1.0000 "application " | TOPICAL_OINTMENT | Freq: Two times a day (BID) | CUTANEOUS | Status: AC
  Administered 2022-03-13 – 2022-03-17 (×7): 1 via NASAL
  Filled 2022-03-13: qty 22

## 2022-03-13 MED ORDER — FUROSEMIDE 40 MG PO TABS
40.0000 mg | ORAL_TABLET | Freq: Every day | ORAL | Status: DC
  Administered 2022-03-14: 40 mg via ORAL
  Filled 2022-03-13: qty 1

## 2022-03-13 MED ORDER — PREDNISONE 20 MG PO TABS
30.0000 mg | ORAL_TABLET | Freq: Every day | ORAL | Status: DC
  Administered 2022-03-13 – 2022-03-14 (×2): 30 mg via ORAL
  Filled 2022-03-13 (×2): qty 1

## 2022-03-13 MED ORDER — CHLORHEXIDINE GLUCONATE CLOTH 2 % EX PADS
6.0000 | MEDICATED_PAD | Freq: Every day | CUTANEOUS | Status: DC
Start: 2022-03-13 — End: 2022-03-17
  Administered 2022-03-13 – 2022-03-16 (×4): 6 via TOPICAL

## 2022-03-13 NOTE — [place] FL2 (Signed)
?[place] MEDICAID FL2 LEVEL OF CARE SCREENING TOOL  ?  ? ?IDENTIFICATION  ?Patient Name: ?Edwin Donovan. Birthdate: [birth year] Sex: male Admission Date (Current Location): ?03/09/2022  ?[place] and [place] Number: ? [place] ?  Facility and Address:  ?[facility], [address], [place], [place] [address] ?     Provider Number: ?[id number]  ?Attending Physician Name and Address:  ?Gwynne Edinger, MD ? Relative Name and Phone Number:  ?  ?   ?Current Level of Care: ?Hospital Recommended Level of Care: ?[place] Prior Approval Number: ?  ? ?Date Approved/Denied: ?  PASRR Number: ?[id number] A ? ?Discharge Plan: ?SNF ?  ? ?Current Diagnoses: ?Patient Active Problem List  ? Diagnosis Date Noted  ? Obesity, Class III, BMI 40-49.9 (morbid obesity) ([place]) 03/12/2022  ? Palliative care encounter   ? History of renal carcinoma 03/10/2022  ? Prostate cancer ([place]) 03/10/2022  ? Lupus erythematosus 03/10/2022  ? Mediastinal mass   ? Acute pulmonary embolism ([place]) 03/09/2022  ? Nodule of upper lobe of right lung 03/09/2022  ? COPD with acute exacerbation ([place]) 03/09/2022  ? Essential hypertension 03/09/2022  ? Dyslipidemia 03/09/2022  ? AKI (acute kidney injury) ([place]) 03/09/2022  ? Acute on chronic diastolic CHF (congestive heart failure) ([place]) 03/09/2022  ? Urothelial carcinoma ([place]) 02/28/2019  ? History of prostate cancer 08/04/2018  ? Cervicalgia 12/25/2017  ? Spondylosis of cervical region without myelopathy or radiculopathy 12/25/2017  ? Primary osteoarthritis of both knees 12/25/2017  ? Lumbar degenerative disc disease 12/25/2017  ? Chronic pain syndrome 12/25/2017  ? COPD (chronic obstructive pulmonary disease) ([place]) 11/03/2017  ? Hypertension 11/03/2017  ? Insomnia 11/03/2017  ? Anxiety 11/03/2017  ? Sepsis ([place]) 06/28/2016  ? Right ureteral stone   ? ? ?Orientation RESPIRATION BLADDER Height & Weight   ?  ?Self, Time, Situation, Place ? O2 (3 L nasal cannula)  Incontinent, External catheter Weight: 292 lb 5.3 oz (132.6 kg) ?Height:  5\' 11"  (180.3 cm)  ?BEHAVIORAL SYMPTOMS/MOOD NEUROLOGICAL BOWEL NUTRITION STATUS  ? (None)  (None) Incontinent Diet (Heart healthy)  ?AMBULATORY STATUS COMMUNICATION OF NEEDS Skin   ?Extensive Assist Verbally Surgical wounds, Other (Comment) (Erythema/redness. Has incision on penis noted on 3/24 but no dressing listed.) ?  ?  ?  ?    ?     ?     ? ? ?Personal Care Assistance Level of Assistance  ?Bathing, Feeding, Dressing Bathing Assistance: Maximum assistance ?Feeding assistance: Limited assistance ?Dressing Assistance: Maximum assistance ?   ? ?Functional Limitations Info  ?Sight, Hearing, Speech Sight Info: Adequate ?Hearing Info: Adequate ?Speech Info: Adequate  ? ? ?SPECIAL CARE FACTORS FREQUENCY  ?PT (By licensed PT), OT (By licensed OT)   ?  ?PT Frequency: 5 x week ?OT Frequency: 5 x week ?  ?  ?  ?   ? ? ?Contractures Contractures Info: Not present  ? ? ?Additional Factors Info  ?Code Status, Allergies Code Status Info: Full code ?Allergies Info: NKDA ?  ?  ?  ?   ? ?Current Medications (03/13/2022):  This is the current hospital active medication list ?Current Facility-Administered Medications  ?Medication Dose Route Frequency Provider Last Rate Last Admin  ? acetaminophen (TYLENOL) tablet 650 mg  650 mg Oral Q6H PRN Mansy, Jan A, MD   650 mg at 03/13/22 1213  ? Or  ? acetaminophen (TYLENOL) suppository 650 mg  650 mg Rectal Q6H PRN Mansy, Arvella Merles, MD      ?  albuterol (PROVENTIL) (2.5 MG/3ML) 0.083% nebulizer solution 3 mL  3 mL Inhalation Q4H PRN Nicole Kindred A, DO   3 mL at 03/12/22 1757  ? atorvastatin (LIPITOR) tablet 80 mg  80 mg Oral Daily Mansy, Jan A, MD   80 mg at 03/13/22 0935  ? azithromycin (ZITHROMAX) tablet 250 mg  250 mg Oral Daily Nicole Kindred A, DO   250 mg at 03/13/22 6387  ? benzonatate (TESSALON) capsule 200 mg  200 mg Oral TID Mansy, Jan A, MD   200 mg at 03/13/22 0934  ? bisacodyl (DULCOLAX) EC tablet 5 mg  5  mg Oral Daily PRN Nicole Kindred A, DO   5 mg at 03/09/22 1615  ? carvedilol (COREG) tablet 12.5 mg  12.5 mg Oral BID WC Nicole Kindred A, DO   12.5 mg at 03/13/22 0934  ? Chlorhexidine Gluconate Cloth 2 % PADS 6 each  6 each Topical Q0600 Wouk, Ailene Rud, MD      ? furosemide (LASIX) tablet 40 mg  40 mg Oral BID Nicole Kindred A, DO   40 mg at 03/13/22 0934  ? ipratropium-albuterol (DUONEB) 0.5-2.5 (3) MG/3ML nebulizer solution 3 mL  3 mL Nebulization Q6H PRN Ottie Glazier, MD   3 mL at 03/13/22 1217  ? loratadine (CLARITIN) tablet 10 mg  10 mg Oral Daily Mansy, Jan A, MD   10 mg at 03/13/22 0935  ? mupirocin ointment (BACTROBAN) 2 % 1 application.  1 application. Nasal BID Wouk, Ailene Rud, MD      ? polyethylene glycol [facility] / GLYCOLAX) packet 17 g  17 g Oral Daily Nicole Kindred A, DO   17 g at 03/13/22 5643  ? senna-docusate (Senokot-S) tablet 1 tablet  1 tablet Oral BID Nicole Kindred A, DO   1 tablet at 03/13/22 0935  ? traZODone (DESYREL) tablet 50 mg  50 mg Oral QHS PRN Mansy, Jan A, MD   50 mg at 03/12/22 2159  ? ? ? ?Discharge Medications: ?Please see discharge summary for a list of discharge medications. ? ?Relevant Imaging Results: ? ?Relevant Lab Results: ? ? ?Additional Information ?SS#: [id number] ? ?Candie Chroman, LCSW ? ? ? ? ?

## 2022-03-13 NOTE — Evaluation (Signed)
Occupational Therapy Evaluation ?Patient Details ?Name: Edwin Donovan. ?MRN: [id number] ?DOB: [birth year] ?Today's Date: 03/13/2022 ? ? ?History of Present Illness Pt is a 76 year old male presenting to the hospital  with progressively worsening lower extremity edema, significant orthopnea, dyspnea worsening on exertion over the last couple weeks, in addition to worsening cough with wheezing.  Chest x-ray reveals large masslike opacity in the left hilumn chest CTA reveals two isolated subsegmental pulmonary emboli at the bifurcation in the right upper lobe pulmonary artery. Overall clot burden is very small.     17 mm spiculated nodule in the posterior right upper lobe.  Associated 10.0 x 7.4 cm left mediastinal mass extending into the medial left upper lobe and left perihilar region. This appearance raises concern for primary bronchogenic neoplasm such as small cell lung cancer.PMH significant for COPD not on home oxygen prior to recent hospital d/c on 02/23/13, chronic diastolic CHF, hypertension and CKD stage II with hx or Right nephrectomy  ? ?Clinical Impression ?  ?Chart reviewed, RN cleared pt for participation in OT evaluation. Pt reports since recent discharge increased assistance required for all ADL and ongoing progressive weakness. He reports he was not amb/using his scooter around the house and was transferring from BSC<>chair only. Pt requires supervision for supine>sit wit HOB raised, STS with MOD-MAX A, SPT with MOD-MAX A to bedside chair with multiple attempts for STS with step by step vcs for body mechanics. MIN A required for UB Dressing, MAX A required for LB Dressing. Pt reports he has an aid to assist with ADL/IADL through the [place] for 2 hrs at home, however sometimes hires for more time during the day. At this time, pt presents with decreased activity tolerance, strength, endurance all affecting safe and optimal ADL/functional/mobility. At this time recommend discharge to STR To address  functional deficits and to promote safe ADL completion. OT will follow acutely.  ?   ? ?Recommendations for follow up therapy are one component of a multi-disciplinary discharge planning process, led by the attending physician.  Recommendations may be updated based on patient status, additional functional criteria and insurance authorization.  ? ?Follow Up Recommendations ? Skilled nursing-short term rehab (<3 hours/day)  ?  ?Assistance Recommended at Discharge Intermittent Supervision/Assistance  ?Patient can return home with the following Help with stairs or ramp for entrance;Assist for transportation;Assistance with cooking/housework;A lot of help with walking and/or transfers;A lot of help with bathing/dressing/bathroom ? ?  ?Functional Status Assessment ? Patient has had a recent decline in their functional status and demonstrates the ability to make significant improvements in function in a reasonable and predictable amount of time.  ?Equipment Recommendations ? None recommended by OT  ?  ?Recommendations for Other Services   ? ? ?  ?Precautions / Restrictions    ? ?  ? ?Mobility Bed Mobility ?Overal bed mobility: Needs Assistance ?Bed Mobility: Supine to Sit ?  ?  ?Supine to sit: Supervision ?  ?  ?General bed mobility comments: HOB raised with use of bed rails ?  ? ?Transfers ?Overall transfer level: Needs assistance ?Equipment used: Rolling walker (2 wheels) ?Transfers: Sit to/from Stand ?Sit to Stand: Mod assist, From elevated surface (pt has hospital bed at home that raises/lowers) ?  ?  ?  ?  ?  ?  ?  ? ?  ?Balance Overall balance assessment: Needs assistance ?Sitting-balance support: Feet supported ?Sitting balance-Leahy Scale: Good ?  ?  ?Standing balance support: Bilateral upper extremity supported ?Standing balance-Leahy Scale:  Poor ?Standing balance comment: unable to achieve full upright standing during SPT, heavy reliance on RW ?  ?  ?  ?  ?  ?  ?  ?  ?  ?  ?  ?   ? ?ADL either performed or  assessed with clinical judgement  ? ?ADL Overall ADL's : Needs assistance/impaired ?Eating/Feeding: Set up;Sitting ?  ?Grooming: Wash/dry hands;Wash/dry face;Sitting;Set up ?  ?  ?  ?  ?  ?Upper Body Dressing : Minimal assistance;Sitting ?Upper Body Dressing Details (indicate cue type and reason): gown in chair ?Lower Body Dressing: Maximal assistance;Bed level ?Lower Body Dressing Details (indicate cue type and reason): socks pt reports this is baseline ?Toilet Transfer: Moderate assistance;Maximal assistance;Stand-pivot;Rolling walker (2 wheels) ?Toilet Transfer Details (indicate cue type and reason): simulated to bedside chair ?  ?  ?  ?  ?Functional mobility during ADLs: Moderate assistance;Maximal assistance;Rolling walker (2 wheels) ?   ? ? ? ?Vision Patient Visual Report: No change from baseline ?   ?   ?Perception   ?  ?Praxis   ?  ? ?Pertinent Vitals/Pain Pain Assessment ?Pain Assessment: No/denies pain  ? ? ? ?Hand Dominance   ?  ?Extremity/Trunk Assessment Upper Extremity Assessment ?Upper Extremity Assessment: Generalized weakness ?  ?Lower Extremity Assessment ?Lower Extremity Assessment: Generalized weakness ?  ?  ?  ?Communication Communication ?Communication: No difficulties ?  ?Cognition Arousal/Alertness: Awake/alert ?Behavior During Therapy: [facility] for tasks assessed/performed ?Overall Cognitive Status: Within Functional Limits for tasks assessed ?  ?  ?  ?  ?  ?  ?  ?  ?  ?  ?  ?  ?  ?  ?  ?  ?  ?  ?  ?General Comments  Pt on 2 L O2 via [place] with spo2 89-90%, on 3L O2 via [place] during mobility with spo2>88%; RN aware. ? ?  ?Exercises Other Exercises ?Other Exercises: edu re: role of OT, role of rehab, discharge recommendations, energy conservation techniques, home safety, DME/AE use for safe ADL completion ?  ?Shoulder Instructions    ? ? ?Home Living Family/patient expects to be discharged to:: Private residence ?Living Arrangements: Alone ?Available Help at Discharge: Personal care attendant ?Type of  Home: House ?Home Access: Ramped entrance ?  ?  ?Home Layout: Two level;Able to live on main level with bedroom/bathroom ?  ?  ?  ?  ?  ?Bathroom Accessibility: Yes ?How Accessible: Accessible via walker (not accessible via walker or cane) ?Home Equipment: Kasandra Knudsen - single point;Shower seat;Wheelchair - power;BSC/3in1;Electric scooter;Grab bars - toilet;Grab bars - tub/shower;Hospital bed (group 3 pwc with ELR, recline, tilt) ?  ?  ?  ? ?  ?Prior Functioning/Environment Prior Level of Function : Needs assist ?  ?  ?  ?  ?  ?  ?Mobility Comments: 2-3 months ago amb with SPC/RW short household distances now reports mainly sitting in lift chair during the day, sleeps in recliner but reports he will sleep in new hospital bed recently delivered ?ADLs Comments: aid that assist 7 days per week for 2 hrs a day through the [place], has hired her for more hours at time; has assist for all IADLs, assist required for bathing and dressing; pt sink bathes ?  ? ?  ?  ?OT Problem List: Decreased strength;Decreased activity tolerance;Impaired balance (sitting and/or standing);Decreased knowledge of use of DME or AE;Cardiopulmonary status limiting activity;Decreased safety awareness ?  ?   ?OT Treatment/Interventions: Self-care/ADL training;Therapeutic exercise;Energy conservation;DME and/or AE instruction;Cognitive remediation/compensation;Therapeutic activities;Balance training;Patient/family  education;Manual therapy  ?  ?OT Goals(Current goals can be found in the care plan section) Acute Rehab OT Goals ?Patient Stated Goal: get stronger ?OT Goal Formulation: With patient ?Time For Goal Achievement: 03/28/22 ?Potential to Achieve Goals: Good ?ADL Goals ?Pt Will Perform Grooming: standing;with min guard assist ?Pt Will Transfer to Toilet: bedside commode;with supervision ?Pt Will Perform Toileting - Clothing Manipulation and hygiene: with min guard assist  ?OT Frequency: Min 2X/week ?  ? ?Co-evaluation   ?  ?  ?  ?  ? ?  ?AM-PAC OT "6  Clicks" Daily Activity     ?Outcome Measure Help from another person eating meals?: None ?Help from another person taking care of personal grooming?: None ?Help from another person toileting, which includes using toli

## 2022-03-13 NOTE — TOC Initial Note (Addendum)
Transition of Care (TOC) - Initial/Assessment Note  ? ? ?Patient Details  ?Name: Edwin Donovan. ?MRN: [id number] ?Date of Birth: 10/16/1946 ? ?Transition of Care (TOC) CM/SW Contact:    ?Candie Chroman, LCSW ?Phone Number: ?03/13/2022, 2:59 PM ? ?Clinical Narrative:  Readmission prevention screen complete. CSW met with patient. No supports at bedside. CSW introduced role and explained that discharge planning would be discussed. PCP is Karl Bales, MD at the [facility]. His friend drives him to appointments. Pharmacy is the [facility]. No issues obtaining medications. He was active with [facility] for PT, OT, RN prior to admission. He has a scooter, bedside commode, and shower chair at home. He also has a motorized wheelchair but it does not work right now. Patient is agreeable to SNF placement. Gave CMS scores for facilities within 25 miles of his zip code. Will follow up with bed offers once obtained. No further concerns. CSW encouraged patient to contact CSW as needed. CSW will continue to follow patient for support and facilitate discharge to SNF once medically stable.               ? ?Expected Discharge Plan: [place] ?Barriers to Discharge: Continued Medical Work up ? ? ?Patient Goals and CMS Choice ?  ?CMS Medicare.gov Compare Post Acute Care list provided to:: Patient ?  ? ?Expected Discharge Plan and Services ?Expected Discharge Plan: [place] ?  ?  ?Post Acute Care Choice: [place] ?Living arrangements for the past 2 months: [place] ?                ?  ?  ?  ?  ?  ?  ?  ?  ?  ?  ? ?Prior Living Arrangements/Services ?Living arrangements for the past 2 months: [place] ?Lives with:: Self ?Patient language and need for interpreter reviewed:: Yes ?Do you feel safe going back to the place where you live?: Yes      ?Need for Family Participation in Patient Care: Yes (Comment) ?Care giver support system in place?: Yes  (comment) ?Current home services: DME, Home OT, Home PT, Home RN ?Criminal Activity/Legal Involvement Pertinent to Current Situation/Hospitalization: No - Comment as needed ? ?Activities of Daily Living ?Home Assistive Devices/Equipment: Cane (specify quad or straight), Walker (specify type) ?ADL Screening (condition at time of admission) ?Patient's cognitive ability adequate to safely complete daily activities?: Yes ?Is the patient deaf or have difficulty hearing?: No ?Does the patient have difficulty seeing, even when wearing glasses/contacts?: No ?Does the patient have difficulty concentrating, remembering, or making decisions?: No ?Patient able to express need for assistance with ADLs?: No ?Does the patient have difficulty dressing or bathing?: Yes ?Independently performs ADLs?: Yes (appropriate for developmental age) ?Does the patient have difficulty walking or climbing stairs?: Yes ?Weakness of Legs: Both ?Weakness of Arms/Hands: None ? ?Permission Sought/Granted ?Permission sought to share information with : Customer service manager ?Permission granted to share information with : Yes, Verbal Permission Granted ?   ? Permission granted to share info w AGENCY: SNF's ?   ?   ? ?Emotional Assessment ?Appearance:: Appears stated age ?Attitude/Demeanor/Rapport: Engaged, Gracious ?Affect (typically observed): Accepting, Appropriate, Calm, Pleasant ?Orientation: : Oriented to Self, Oriented to Place, Oriented to  Time, Oriented to Situation ?Alcohol / Substance Use: Not Applicable ?Psych Involvement: No (comment) ? ?Admission diagnosis:  Shortness of breath [R06.02] ?Lung mass [R91.8] ?COPD exacerbation ([place]) [J44.1] ?Acute pulmonary embolism ([place]) [I26.99] ?Other  acute pulmonary embolism without acute cor pulmonale ([place]) [I26.99] ?Patient Active Problem List  ? Diagnosis Date Noted  ? Obesity, Class III, BMI 40-49.9 (morbid obesity) ([place]) 03/12/2022  ? Palliative care encounter   ? History of renal carcinoma  03/10/2022  ? Prostate cancer ([place]) 03/10/2022  ? Lupus erythematosus 03/10/2022  ? Mediastinal mass   ? Acute pulmonary embolism ([place]) 03/09/2022  ? Nodule of upper lobe of right lung 03/09/2022  ? COPD with acute exacerbation ([place]) 03/09/2022  ? Essential hypertension 03/09/2022  ? Dyslipidemia 03/09/2022  ? AKI (acute kidney injury) ([place]) 03/09/2022  ? Acute on chronic diastolic CHF (congestive heart failure) ([place]) 03/09/2022  ? Urothelial carcinoma ([place]) 02/28/2019  ? History of prostate cancer 08/04/2018  ? Cervicalgia 12/25/2017  ? Spondylosis of cervical region without myelopathy or radiculopathy 12/25/2017  ? Primary osteoarthritis of both knees 12/25/2017  ? Lumbar degenerative disc disease 12/25/2017  ? Chronic pain syndrome 12/25/2017  ? COPD (chronic obstructive pulmonary disease) ([place]) 11/03/2017  ? Hypertension 11/03/2017  ? Insomnia 11/03/2017  ? Anxiety 11/03/2017  ? Sepsis ([place]) 06/28/2016  ? Right ureteral stone   ? ?PCP:  System, Provider Not In ?Pharmacy:   ?[facility] [address] ?[address] ?[place] [address] ?Phone: [phone] Fax: [phone] ? ? ? ? ?Social Determinants of Health (SDOH) Interventions ?  ? ?Readmission Risk Interventions ?   ? View : No data to display.  ?  ?  ?  ? ? ? ?

## 2022-03-13 NOTE — Evaluation (Signed)
Physical Therapy Evaluation ?Patient Details ?Name: Edwin Donovan. ?MRN: [id number] ?DOB: [birth year] ?Today's Date: 03/13/2022 ? ?History of Present Illness ? Pt is a 76 year old male presenting to the hospital  with progressively worsening lower extremity edema, significant orthopnea, dyspnea worsening on exertion over the last couple weeks, in addition to worsening cough with wheezing.  Chest x-Kemyah Buser reveals large masslike opacity in the left hilumn chest CTA reveals two isolated subsegmental pulmonary emboli at the bifurcation in the right upper lobe pulmonary artery. Overall clot burden is very small.     17 mm spiculated nodule in the posterior right upper lobe.  Associated 10.0 x 7.4 cm left mediastinal mass extending into the medial left upper lobe and left perihilar region. This appearance raises concern for primary bronchogenic neoplasm such as small cell lung cancer.PMH significant for COPD not on home oxygen prior to recent hospital d/c on 5/97/41, chronic diastolic CHF, hypertension and CKD stage II with hx or Right nephrectomy  ?Clinical Impression ? Pt is a pleasant 76 year old male who was admitted for acute PE. Cleared for participation this date. Pt performs transfers/ambulation with mod/max assist +2 and RW. All mobility performed on 3L of O2 with sats ranging from 88-91% with exertion. Pt demonstrates deficits with strength/endurance/mobility. Pt is currently not at baseline level. Eager to participate and wants to work on deficits in order to return back to baseline. Would benefit from skilled PT to address above deficits and promote optimal return to PLOF; recommend transition to STR upon discharge from acute hospitalization. ?  ?   ? ?Recommendations for follow up therapy are one component of a multi-disciplinary discharge planning process, led by the attending physician.  Recommendations may be updated based on patient status, additional functional criteria and insurance  authorization. ? ?Follow Up Recommendations Skilled nursing-short term rehab (<3 hours/day) ? ?  ?Assistance Recommended at Discharge Frequent or constant Supervision/Assistance  ?Patient can return home with the following ? Two people to help with walking and/or transfers;Two people to help with bathing/dressing/bathroom;Help with stairs or ramp for entrance ? ?  ?Equipment Recommendations  (TBD)  ?Recommendations for Other Services ?    ?  ?Functional Status Assessment Patient has had a recent decline in their functional status and demonstrates the ability to make significant improvements in function in a reasonable and predictable amount of time.  ? ?  ?Precautions / Restrictions Precautions ?Precautions: Fall ?Restrictions ?Weight Bearing Restrictions: No  ? ?  ? ?Mobility ? Bed Mobility ?  ?  ?  ?  ?  ?  ?  ?General bed mobility comments: not performed as received seated in recliner ?  ? ?Transfers ?Overall transfer level: Needs assistance ?Equipment used: Rolling walker (2 wheels) ?Transfers: Sit to/from Stand ?Sit to Stand: Mod assist, Max assist, +2 physical assistance ?  ?  ?  ?  ?  ?General transfer comment: sit<>Stand with +2 and mod/max assist and use of RW. All mobility performed on 3L of O2 with sats ranging from 88-91% with exertion. ?  ? ?Ambulation/Gait ?  ?  ?  ?  ?  ?  ?  ?General Gait Details: not performed ? ?Stairs ?  ?  ?  ?  ?  ? ?Wheelchair Mobility ?  ? ?Modified Rankin (Stroke Patients Only) ?  ? ?  ? ?Balance Overall balance assessment: Needs assistance ?Sitting-balance support: Feet supported ?Sitting balance-Leahy Scale: Good ?  ?  ?Standing balance support: Bilateral upper extremity supported ?Standing balance-Leahy  Scale: Fair ?  ?  ?  ?  ?  ?  ?  ?  ?  ?  ?  ?  ?   ? ? ? ?Pertinent Vitals/Pain Pain Assessment ?Pain Assessment: No/denies pain  ? ? ?Home Living Family/patient expects to be discharged to:: Private residence ?Living Arrangements: Alone ?Available Help at Discharge:  Personal care attendant (4 hours per day) ?Type of Home: House ?Home Access: Ramped entrance ?  ?  ?  ?Home Layout: Two level;Able to live on main level with bedroom/bathroom ?Home Equipment: Kasandra Knudsen - single point;Shower seat;Wheelchair - power;BSC/3in1;Electric scooter;Grab bars - toilet;Grab bars - tub/shower;Hospital bed ?Additional Comments: reports all DME is readily available in his living room.  ?  ?Prior Function Prior Level of Function : Needs assist ?  ?  ?  ?  ?  ?  ?Mobility Comments: 2-3 months ago amb with SPC/RW short household distances now reports mainly sitting in lift chair during the day, sleeps in recliner but reports he will sleep in new hospital bed recently delivered. Reports multiple falls recently ?ADLs Comments: aid that assist 7 days per week for 2 hrs a day through the [place], has hired her for more hours at time; has assist for all IADLs, assist required for bathing and dressing; pt sink bathes ?  ? ? ?Hand Dominance  ?   ? ?  ?Extremity/Trunk Assessment  ? Upper Extremity Assessment ?Upper Extremity Assessment: Generalized weakness (B UE grossly 4/5) ?  ? ?Lower Extremity Assessment ?Lower Extremity Assessment: Generalized weakness (R LE grossly 3/5; L LE grossly 4/5) ?  ? ?   ?Communication  ? Communication: No difficulties  ?Cognition Arousal/Alertness: Awake/alert ?Behavior During Therapy: [facility] for tasks assessed/performed ?Overall Cognitive Status: Within Functional Limits for tasks assessed ?  ?  ?  ?  ?  ?  ?  ?  ?  ?  ?  ?  ?  ?  ?  ?  ?  ?  ?  ? ?  ?General Comments General comments (skin integrity, edema, etc.): R LE wrapped due to edema ? ?  ?Exercises Other Exercises ?Other Exercises: seated ther-ex performed on B LE including AP, quad sets, and hip abd/add. 5 reps with increased difficulty with R LE. MOnitiored vitals throughout  ? ?Assessment/Plan  ?  ?PT Assessment Patient needs continued PT services  ?PT Problem List Decreased strength;Decreased activity tolerance;Decreased  balance;Decreased mobility;Cardiopulmonary status limiting activity;Obesity ? ?   ?  ?PT Treatment Interventions DME instruction;Functional mobility training;Therapeutic activities;Balance training;Therapeutic exercise;Neuromuscular re-education;Patient/family education;Wheelchair mobility training   ? ?PT Goals (Current goals can be found in the Care Plan section)  ?Acute Rehab PT Goals ?Patient Stated Goal: to get stronger first ?PT Goal Formulation: With patient ?Time For Goal Achievement: 03/27/22 ?Potential to Achieve Goals: Fair ? ?  ?Frequency Min 2X/week ?  ? ? ?Co-evaluation   ?  ?  ?  ?  ? ? ?  ?AM-PAC PT "6 Clicks" Mobility  ?Outcome Measure Help needed turning from your back to your side while in a flat bed without using bedrails?: A Little ?Help needed moving from lying on your back to sitting on the side of a flat bed without using bedrails?: A Little ?Help needed moving to and from a bed to a chair (including a wheelchair)?: A Lot ?Help needed standing up from a chair using your arms (e.g., wheelchair or bedside chair)?: A Lot ?Help needed to walk in hospital room?: Total ?Help needed climbing 3-5 steps  with a railing? : Total ?6 Click Score: 12 ? ?  ?End of Session Equipment Utilized During Treatment: Oxygen;Gait belt ?Activity Tolerance: Patient limited by fatigue ?Patient left: in chair;with chair alarm set ?Nurse Communication: Mobility status ?PT Visit Diagnosis: Unsteadiness on feet (R26.81);Muscle weakness (generalized) (M62.81);History of falling (Z91.81);Difficulty in walking, not elsewhere classified (R26.2) ?  ? ?Time: 1001-1026 ?PT Time Calculation (min) (ACUTE ONLY): 25 min ? ? ?Charges:   PT Evaluation ?$PT Eval Moderate Complexity: 1 Mod ?PT Treatments ?$Therapeutic Exercise: 8-22 mins ?  ?   ? ? ?Greggory Stallion, PT, DPT, GCS ?[phone] ? ? ?Vegas Fritze ?03/13/2022, 12:51 PM ? ?

## 2022-03-13 NOTE — Progress Notes (Signed)
?  Chaplain On-Call responded to [place] Order for Advance Directives information for the patient. ? ?Chaplain met the patient and learned that he has completed the AD documents. ? ?Chaplain assured patient that Chaplains will seek to secure two Volunteers and an Secondary school teacher for authorization of the documents. ? ?Chaplain Pollyann Samples ?M.Div., BCC ?

## 2022-03-13 NOTE — Progress Notes (Signed)
?Progress Note ? ? ?Patient: Edwin Donovan. LZJ:[id number] DOB: Nov 07, 1946 DOA: 03/09/2022     4 ?DOS: the patient was seen and examined on 03/13/2022 ?  ?Brief hospital course: ?Edwin Donovan. is a 76 y.o. male with past medical history COPD not on home oxygen prior to recent hospital d/c on 0/24/09, chronic diastolic CHF, hypertension and CKD stage II with hx or Right nephrectomy.  He presented to the ED early AM on 03/09/2022 with progressively worsening lower extremity edema, significant orthopnea, dyspnea worsening on exertion over the last couple weeks, in addition to worsening cough with wheezing.   ?  ?ED Course -- RR 22, spO2 95% on 3 L/min  O2, otherwise stable vitals.  Labs showed AKI, mild anemia, mild thrombocytopenia, minimally elevated hs-troponin which trended flat. ?  ?Imaging -- ?Chest x-ray - large masslike opacity in the left hilum. ?Chest CTA  - two isolated subsegmental pulmonary emboli at the bifurcation in the right upper lobe pulmonary artery. Overall clot burden is very small.    ?17 mm spiculated nodule in the posterior right upper lobe.  Associated 10.0 x 7.4 cm left mediastinal mass extending into the medial left upper lobe and left perihilar region. This appearance raises concern for primary bronchogenic neoplasm such as small cell lung cancer. ?  ?Admitted to hospitalist service with pulmonology, oncology and palliative care consulted.  ? ? ?Assessment and Plan: ?* Acute pulmonary embolism ([place]) ?Started on heparin, then eliquis, now all on hold pending bronch on Friday, plan for eliquis resumption after procedure ? ?COPD with acute exacerbation ([place]) ?Presented with wheezing and increased O2 requirement.  Started on IV steroids on admission, transitioned to prednisone. ?--Continue prednisone, start tapering, 30 qd ?--5 days Zithromax 250 mg ?--Duonebs and PRN albuterol nebs ?--Antitussives, mucolytics ?--Pulmonary hygiene ?--Supplement O2 PRN per protocol ? ?Acute  on chronic diastolic CHF (congestive heart failure) ([place]) ?Echo this admission on 4/22 technically limited study with preserved EF, but diastolic parameters indeterminate. ?Started on IV Lasix BID on admission. ?Edema improved ?- resume home lasix 40 qd ?--Toprol-XL 100 mg -- prescribe at d/c ?--Substituting Coreg for Toprol due to BP's elevated with ACEI on hold (AKI).  Can resume metop once ACEI resumed.   ?--Follow renal function, electrolytes ?--Strict I/O's, daily weights ? ? ?Nodule of upper lobe of right lung ?17 mm spiculated nodule in the posterior right upper lobe. ?Associated 10.0 x 7.4 cm left mediastinal mass extending into the ?medial left upper lobe and left perihilar region. This appearance ?raises concern for primary bronchogenic neoplasm such as small cell ?lung cancer. Mri no signs mets ?-Pulmonary, Oncology, Palliative Care are consulted ?--Cardiology cleared for bronchoscopy to biopsy ?--Bronchoscopy EBUS planned Friday ?- outpt f/u dr. Janese Banks of oncology. PET ordered ? ?Obesity, Class III, BMI 40-49.9 (morbid obesity) ([place]) ?Body mass index is 40.53 kg/m?Marland Kitchen ?Complicates overall care and prognosis.  Recommend lifestyle modifications including physical activity and diet for weight loss and overall long-term health. ? ?Lupus erythematosus ?Noted in chart review of [place] [facility] records. ?No apparent acute issues. ? ?Prostate cancer ([place]) ?Noted.  Monitor for obstructive urinary symptoms.   ? ?History of renal carcinoma ?S/p right nephrectomy, now solitary left kidney.  Monitor renal function and urine output closely. ? ?AKI (acute kidney injury) ([place]) ?Suspect prerenal from acute CHF with secondary renal hypoperfusion. ?Renal function improving with diuresis. Baseline creatinine is around 1, currently 1.5 ?Monitor BMP. ?Avoid nephrotoxins, hypotension ?Maintain MAP>65 ?Renally dose meds ? ?Dyslipidemia ?Continue statin  ? ?  Essential hypertension ?Using Coreg in place of Toprol-XL (while ACEI held due to AKI,  BP's elevated).   ?Zestril held due to AKI.   ?Resume Zestril with renal function improves. ?Adjust antihypertensives PRN. ? ? ?Subjective: breathing improving. No chest pain. Tolerating diet.  ? ?Physical Exam: ?Vitals:  ? 03/13/22 0500 03/13/22 0748 03/13/22 0930 03/13/22 1238  ?BP:  (!) 141/88  (!) 142/91  ?Pulse:  71 85 82  ?Resp:  (!) 21  (!) 21  ?Temp:  98.4 ?F (36.9 ?C)  98 ?F (36.7 ?C)  ?TempSrc:  Oral    ?SpO2:  93% 90% 91%  ?Weight: 132.6 kg     ?Height:      ? ?General exam: awake, alert, no acute distress, obese, chronically ill-appearing ?HEENT: moist mucus membranes, hearing grossly normal  ?Respiratory system: CTAB but diminished throughout, no wheezes, rales or rhonchi, normal respiratory effort, on 2 L/min [place] O2. ?Cardiovascular system: normal S1/S2, RRR, resolved lower extremity edema.  ?Central nervous system: A&O x 4. no gross focal neurologic deficits, normal speech ?Extremities: Unna boot on right lower extremity, bilateral lower extremity edema has resolved ?Skin: dry, intact, normal temperature ?Psychiatry: normal mood, congruent affect, judgement and insight appear normal ? ? ? ?Data Reviewed: ? ?Notable labs: Creatinine stable 1.56, glucose 105, BUN 57, hemoglobin 12.2 ? ?Family Communication: Significant other at bedside on rounds today ? ?Disposition: ?Status is: Inpatient ?Remains inpatient appropriate because: Ongoing diagnostic evaluation not appropriate for outpatient setting, monitoring renal function closely with persistent AKI and solitary kidney, adjusting diuretics ? ? Planned Discharge Destination: Skilled nursing facility ? ?Time spent: 30 minutes ? ?Author: ?Desma Maxim, MD ?03/13/2022 4:11 PM ? ?For on call review [email or web address].  ?

## 2022-03-13 NOTE — Progress Notes (Signed)
? ? ? ?PULMONOLOGY ? ? ? ? ? ? ? ? ?Date: 03/13/2022,   ?MRN# [id number] Edwin Donovan. [birth year] ? ? ?  ?AdmissionWeight: (!) 141.5 kg                 ?CurrentWeight: 132.6 kg ? ?Referring provider: Dr. Arbutus Ped ? ? ?CHIEF COMPLAINT:  ? ?Nonmassive PE with RUL nodule and mediastinal lymphadenopathy ? ? ?HISTORY OF PRESENT ILLNESS  ? ?This is a pleasant 76 year old male with a history of prostate CA, disorder, arthritis, CHF, CKD COPD, dysrhythmia history of nephrolithiasis and essential previous episode of pneumonia in 2017, he reports worsening cough and wheezing.  He was found to be mildly tachypneic on arrival and required 3 L of supplemental oxygen to reach normoxia at SPO2 95%.  Labs showed CKD however troponins were essentially normal in the context of CKD with only mild elevation.  Influenza and COVID-19 are negative.  CTA was performed with findings of subsegmental pulmonary emboli as well as 17 mm spiculated nodule of the right upper lobe and associated mediastinal lymphadenopathy/mass.  He had nephrectomy in the past due to renal cell carcinoma.  He quit smoking 2 years ago. PCCM consultation was placed for additional evaluation and management.  ? ?03/13/22 - patient is ok stable clinically. S/p PT/OT today. He had eliquis , this was stopped in prep for bronchoscopy on Friday 03/15/22 730 AM.  He is on 3L/min [place]. We can use SCDs for now.  ? ?PAST MEDICAL HISTORY  ? ?Past Medical History:  ?Diagnosis Date  ?? Anxiety   ? ptsd  ?? Arthritis   ?? Cancer [facility])   ? PROSTATE  ?? CHF (congestive heart failure) ([place])   ?? Chronic kidney disease   ? stones  ?? COPD (chronic obstructive pulmonary disease) ([place])   ?? Dysrhythmia   ?? History of kidney stones   ?? Hypertension   ?? Kidney stone   ?? Pneumonia   ? 06/27/16  ?? Shortness of breath dyspnea   ? ? ? ?SURGICAL HISTORY  ? ?Past Surgical History:  ?Procedure Laterality Date  ?? CYSTOSCOPY W/ RETROGRADES Bilateral 12/26/2017  ? Procedure: CYSTOSCOPY WITH  RETROGRADE PYELOGRAM;  Surgeon: Abbie Sons, MD;  Location: [facility];  Service: Urology;  Laterality: Bilateral;  ?? CYSTOSCOPY W/ RETROGRADES Right 02/09/2019  ? Procedure: CYSTOSCOPY WITH RETROGRADE PYELOGRAM;  Surgeon: Abbie Sons, MD;  Location: [facility];  Service: Urology;  Laterality: Right;  ?? CYSTOSCOPY W/ URETERAL STENT PLACEMENT Right 07/29/2016  ? Procedure: CYSTOSCOPY WITH STENT REPLACEMENT;  Surgeon: Hollice Espy, MD;  Location: [facility];  Service: Urology;  Laterality: Right;  ?? CYSTOSCOPY WITH BIOPSY Right 07/29/2016  ? Procedure: CYSTOSCOPY WITH BIOPSY;  Surgeon: Hollice Espy, MD;  Location: [facility];  Service: Urology;  Laterality: Right;  ?? CYSTOSCOPY WITH BIOPSY Right 02/09/2019  ? Procedure: CYSTOSCOPY WITH BIOPSY;  Surgeon: Abbie Sons, MD;  Location: [facility];  Service: Urology;  Laterality: Right;  ?? CYSTOSCOPY WITH STENT PLACEMENT Right 06/28/2016  ? Procedure: CYSTOSCOPY WITH STENT PLACEMENT;  Surgeon: Hollice Espy, MD;  Location: [facility];  Service: Urology;  Laterality: Right;  ?? CYSTOSCOPY WITH STENT PLACEMENT Right 02/09/2019  ? Procedure: CYSTOSCOPY WITH STENT PLACEMENT;  Surgeon: Abbie Sons, MD;  Location: [facility];  Service: Urology;  Laterality: Right;  ?? CYSTOSCOPY/URETEROSCOPY/HOLMIUM LASER/STENT PLACEMENT Right 12/26/2017  ? Procedure: CYSTOSCOPY/URETEROSCOPY/HOLMIUM LASER/STENT PLACEMENT;  Surgeon: Abbie Sons, MD;  Location: [facility];  Service: Urology;  Laterality: Right;  ??  EXTRACORPOREAL SHOCK WAVE LITHOTRIPSY Right 12/04/2017  ? Procedure: EXTRACORPOREAL SHOCK WAVE LITHOTRIPSY (ESWL);  Surgeon: Hollice Espy, MD;  Location: [facility];  Service: Urology;  Laterality: Right;  ?? PROSTATECTOMY    ?? URETEROSCOPY Right 07/29/2016  ? Procedure: URETEROSCOPY;  Surgeon: Hollice Espy, MD;  Location: [facility];  Service: Urology;  Laterality: Right;  ?? URETEROSCOPY Right 02/09/2019  ? Procedure: URETEROSCOPY- DIAGNOSTIC;  Surgeon: Abbie Sons, MD;   Location: [facility];  Service: Urology;  Laterality: Right;  ? ? ? ?FAMILY HISTORY  ? ?Family History  ?Problem Relation Age of Onset  ?? Stroke Mother   ?? Heart attack Father   ?? Lung cancer Father   ?? Kidney disease Father   ?? Prostate cancer Neg Hx   ?? Bladder Cancer Neg Hx   ? ? ? ?SOCIAL HISTORY  ? ?Social History  ? ?Tobacco Use  ?? Smoking status: Some Days  ?  Packs/day: 0.50  ?  Types: Cigarettes  ?? Smokeless tobacco: Never  ?Vaping Use  ?? Vaping Use: Never used  ?Substance Use Topics  ?? Alcohol use: Yes  ?  Alcohol/week: 14.0 standard drinks  ?  Types: 14 Cans of beer per week  ?? Drug use: Yes  ?  Types: Marijuana  ?  Comment: "sometimes, not very often"  ? ? ? ?MEDICATIONS  ? ? ?Home Medication:  ?Current Outpatient Rx  ?? Order #: [id number] Class: Normal  ?? Order #: [id number] Class: Normal  ?? Order #: [id number] Class: Normal  ? ?  ?Current Medication: ? ?Current Facility-Administered Medications:  ??  acetaminophen (TYLENOL) tablet 650 mg, 650 mg, Oral, Q6H PRN, 650 mg at 03/13/22 1213 **OR** acetaminophen (TYLENOL) suppository 650 mg, 650 mg, Rectal, Q6H PRN, Mansy, Jan A, MD ??  albuterol (PROVENTIL) (2.5 MG/3ML) 0.083% nebulizer solution 3 mL, 3 mL, Inhalation, Q4H PRN, Nicole Kindred A, DO, 3 mL at 03/12/22 1757 ??  atorvastatin (LIPITOR) tablet 80 mg, 80 mg, Oral, Daily, Mansy, Jan A, MD, 80 mg at 03/13/22 0935 ??  azithromycin (ZITHROMAX) tablet 250 mg, 250 mg, Oral, Daily, Nicole Kindred A, DO, 250 mg at 03/13/22 0934 ??  benzonatate (TESSALON) capsule 200 mg, 200 mg, Oral, TID, Mansy, Jan A, MD, 200 mg at 03/13/22 0934 ??  bisacodyl (DULCOLAX) EC tablet 5 mg, 5 mg, Oral, Daily PRN, Nicole Kindred A, DO, 5 mg at 03/09/22 1615 ??  carvedilol (COREG) tablet 12.5 mg, 12.5 mg, Oral, BID WC, Nicole Kindred A, DO, 12.5 mg at 03/13/22 0934 ??  Chlorhexidine Gluconate Cloth 2 % PADS 6 each, 6 each, Topical, Q0600, Wouk, Ailene Rud, MD ??  furosemide (LASIX) tablet 40 mg, 40 mg, Oral, BID,  Nicole Kindred A, DO, 40 mg at 03/13/22 1937 ??  ipratropium-albuterol (DUONEB) 0.5-2.5 (3) MG/3ML nebulizer solution 3 mL, 3 mL, Nebulization, Q6H PRN, Ottie Glazier, MD, 3 mL at 03/13/22 1217 ??  loratadine (CLARITIN) tablet 10 mg, 10 mg, Oral, Daily, 10 mg at 03/13/22 0935 **AND** [DISCONTINUED] pseudoephedrine (SUDAFED) 12 hr tablet 120 mg, 120 mg, Oral, BID, Mansy, Jan A, MD, 120 mg at 03/13/22 0933 ??  mupirocin ointment (BACTROBAN) 2 % 1 application., 1 application., Nasal, BID, Wouk, Ailene Rud, MD ??  polyethylene glycol (MIRALAX / GLYCOLAX) packet 17 g, 17 g, Oral, Daily, Nicole Kindred A, DO, 17 g at 03/13/22 0936 ??  senna-docusate (Senokot-S) tablet 1 tablet, 1 tablet, Oral, BID, Ezekiel Slocumb, DO, 1 tablet at 03/13/22 0935 ??  traZODone (DESYREL) tablet 50 mg, 50 mg, Oral,  QHS PRN, Mansy, Jan A, MD, 50 mg at 03/12/22 2159 ? ? ? ?ALLERGIES  ? ?Patient has no known allergies. ? ? ? ? ?REVIEW OF SYSTEMS  ? ? ?Review of Systems: ? ?Gen:  Denies  fever, sweats, chills weigh loss  ?HEENT: Denies blurred vision, double vision, ear pain, eye pain, hearing loss, nose bleeds, sore throat ?Cardiac:  No dizziness, chest pain or heaviness, chest tightness,edema ?Resp:   reports dyspnea chronically  ?Gi: Denies swallowing difficulty, stomach pain, nausea or vomiting, diarrhea, constipation, bowel incontinence ?Gu:  Denies bladder incontinence, burning urine ?Ext:   Denies Joint pain, stiffness or swelling ?Skin: Denies  skin rash, easy bruising or bleeding or hives ?Endoc:  Denies polyuria, polydipsia , polyphagia or weight change ?Psych:   Denies depression, insomnia or hallucinations  ? ?Other:  All other systems negative ? ? ?VS: BP (!) 142/91 (BP Location: Left Arm)   Pulse 82   Temp 98 ?F (36.7 ?C)   Resp (!) 21   Ht 5\' 11"  (1.803 m)   Wt 132.6 kg   SpO2 91%   BMI 40.77 kg/m?   ? ? ? ?PHYSICAL EXAM  ? ? ?GENERAL:NAD, no fevers, chills, no weakness no fatigue ?HEAD: Normocephalic, atraumatic.   ?EYES: Pupils equal, round, reactive to light. Extraocular muscles intact. No scleral icterus.  ?MOUTH: Moist mucosal membrane. Dentition intact. No abscess noted.  ?EAR, NOSE, THROAT: Clear without exudates. No

## 2022-03-14 ENCOUNTER — Inpatient Hospital Stay: Payer: Medicare Other

## 2022-03-14 DIAGNOSIS — I2699 Other pulmonary embolism without acute cor pulmonale: Secondary | ICD-10-CM | POA: Diagnosis not present

## 2022-03-14 LAB — BASIC METABOLIC PANEL
Anion gap: 8 (ref 5–15)
BUN: 63 mg/dL — ABNORMAL HIGH (ref 8–23)
CO2: 33 mmol/L — ABNORMAL HIGH (ref 22–32)
Calcium: 9.5 mg/dL (ref 8.9–10.3)
Chloride: 96 mmol/L — ABNORMAL LOW (ref 98–111)
Creatinine, Ser: 1.59 mg/dL — ABNORMAL HIGH (ref 0.61–1.24)
GFR, Estimated: 44 mL/min — ABNORMAL LOW (ref >60)
Glucose, Bld: 131 mg/dL — ABNORMAL HIGH (ref 70–99)
Potassium: 4.2 mmol/L (ref 3.5–5.1)
Sodium: 137 mmol/L (ref 135–145)

## 2022-03-14 MED ORDER — MAGNESIUM HYDROXIDE 400 MG/5ML PO SUSP
30.0000 mL | Freq: Every day | ORAL | Status: DC
  Administered 2022-03-14 – 2022-03-15 (×2): 30 mL via ORAL
  Filled 2022-03-14 (×2): qty 30

## 2022-03-14 MED ORDER — FUROSEMIDE 10 MG/ML IJ SOLN: 40.0000 mg | Freq: Every day | INTRAMUSCULAR | Status: DC

## 2022-03-14 MED ORDER — FUROSEMIDE 10 MG/ML IJ SOLN
80.0000 mg | Freq: Once | INTRAMUSCULAR | Status: AC
  Administered 2022-03-14: 80 mg via INTRAVENOUS
  Filled 2022-03-14: qty 8

## 2022-03-14 MED ORDER — BUTAMBEN-TETRACAINE-BENZOCAINE 2-2-14 % EX AERO
1.0000 | INHALATION_SPRAY | Freq: Once | CUTANEOUS | Status: DC
  Filled 2022-03-14: qty 20

## 2022-03-14 MED ORDER — LIDOCAINE HCL (PF) 1 % IJ SOLN: 30.0000 mL | Freq: Once | INTRAMUSCULAR | Status: DC

## 2022-03-14 MED ORDER — PHENYLEPHRINE HCL 0.25 % NA SOLN: 1.0000 | Freq: Four times a day (QID) | NASAL | Status: DC | PRN

## 2022-03-14 MED ORDER — LIDOCAINE HCL URETHRAL/MUCOSAL 2 % EX GEL
1.0000 "application " | Freq: Once | CUTANEOUS | Status: DC
  Filled 2022-03-14: qty 5

## 2022-03-14 NOTE — TOC Progression Note (Addendum)
Transition of Care (TOC) - Progression Note  ? ? ?Patient Details  ?Name: Edwin Donovan. ?MRN: [id number] ?Date of Birth: 1946/06/05 ? ?Transition of Care (TOC) CM/SW Contact  ?Candie Chroman, LCSW ?Phone Number: ?03/14/2022, 9:53 AM ? ?Clinical Narrative:  Met with patient and friend at bedside. Reviewed bed offers: [facility] and [facility]. [facility] has not responded. He is interested in [facility]. Asked admissions coordinator to review.  ? ?12:31 pm: Patient has accepted bed offer from [facility]. ? ?1:20 pm: Per MD, potential discharge tomorrow. SNF admissions coordinator is aware. Uploaded clinicals into [facility] portal to start insurance authorization. ? ?Expected Discharge Plan: [place] ?Barriers to Discharge: Continued Medical Work up ? ?Expected Discharge Plan and Services ?Expected Discharge Plan: [place] ?  ?  ?Post Acute Care Choice: [place] ?Living arrangements for the past 2 months: [place] ?                ?  ?  ?  ?  ?  ?  ?  ?  ?  ?  ? ? ?Social Determinants of Health (SDOH) Interventions ?  ? ?Readmission Risk Interventions ? ?  03/13/2022  ?  3:42 PM  ?Readmission Risk Prevention Plan  ?Transportation Screening Complete  ?PCP or Specialist Appt within 3-5 Days Complete  ?[place] or Home Care Consult Complete  ?Social Work Consult for [place] Planning/Counseling Complete  ?Palliative Care Screening Not Applicable  ?Medication Review [employer]) Complete  ? ? ?

## 2022-03-14 NOTE — Progress Notes (Signed)
? ? ? ?PULMONOLOGY ? ? ? ? ? ? ? ? ?Date: 03/14/2022,   ?MRN# [id number] Edwin Donovan. Oct 09, 1946 ? ? ?  ?AdmissionWeight: (!) 141.5 kg                 ?CurrentWeight: 132.7 kg ? ?Referring provider: Dr. Arbutus Ped ? ? ?CHIEF COMPLAINT:  ? ?Nonmassive PE with RUL nodule and mediastinal lymphadenopathy ? ? ?HISTORY OF PRESENT ILLNESS  ? ?This is a pleasant 76 year old male with a history of prostate CA, disorder, arthritis, CHF, CKD COPD, dysrhythmia history of nephrolithiasis and essential previous episode of pneumonia in 2017, he reports worsening cough and wheezing.  He was found to be mildly tachypneic on arrival and required 3 L of supplemental oxygen to reach normoxia at SPO2 95%.  Labs showed CKD however troponins were essentially normal in the context of CKD with only mild elevation.  Influenza and COVID-19 are negative.  CTA was performed with findings of subsegmental pulmonary emboli as well as 17 mm spiculated nodule of the right upper lobe and associated mediastinal lymphadenopathy/mass.  He had nephrectomy in the past due to renal cell carcinoma.  He quit smoking 2 years ago. PCCM consultation was placed for additional evaluation and management.  ? ?03/14/22- patient stable no issues over night. His Fiance was at bedside we reviewed medical plan. He had CT chest and US DVT study done both reviewed.  ? ?PAST MEDICAL HISTORY  ? ?Past Medical History:  ?Diagnosis Date  ?? Anxiety   ? ptsd  ?? Arthritis   ?? Cancer [facility])   ? PROSTATE  ?? CHF (congestive heart failure) ([place])   ?? Chronic kidney disease   ? stones  ?? COPD (chronic obstructive pulmonary disease) ([place])   ?? Dysrhythmia   ?? History of kidney stones   ?? Hypertension   ?? Kidney stone   ?? Pneumonia   ? 06/27/16  ?? Shortness of breath dyspnea   ? ? ? ?SURGICAL HISTORY  ? ?Past Surgical History:  ?Procedure Laterality Date  ?? CYSTOSCOPY W/ RETROGRADES Bilateral 12/26/2017  ? Procedure: CYSTOSCOPY WITH RETROGRADE PYELOGRAM;  Surgeon: Abbie Sons, MD;  Location: [facility];  Service: Urology;  Laterality: Bilateral;  ?? CYSTOSCOPY W/ RETROGRADES Right 02/09/2019  ? Procedure: CYSTOSCOPY WITH RETROGRADE PYELOGRAM;  Surgeon: Abbie Sons, MD;  Location: [facility];  Service: Urology;  Laterality: Right;  ?? CYSTOSCOPY W/ URETERAL STENT PLACEMENT Right 07/29/2016  ? Procedure: CYSTOSCOPY WITH STENT REPLACEMENT;  Surgeon: Hollice Espy, MD;  Location: [facility];  Service: Urology;  Laterality: Right;  ?? CYSTOSCOPY WITH BIOPSY Right 07/29/2016  ? Procedure: CYSTOSCOPY WITH BIOPSY;  Surgeon: Hollice Espy, MD;  Location: [facility];  Service: Urology;  Laterality: Right;  ?? CYSTOSCOPY WITH BIOPSY Right 02/09/2019  ? Procedure: CYSTOSCOPY WITH BIOPSY;  Surgeon: Abbie Sons, MD;  Location: [facility];  Service: Urology;  Laterality: Right;  ?? CYSTOSCOPY WITH STENT PLACEMENT Right 06/28/2016  ? Procedure: CYSTOSCOPY WITH STENT PLACEMENT;  Surgeon: Hollice Espy, MD;  Location: [facility];  Service: Urology;  Laterality: Right;  ?? CYSTOSCOPY WITH STENT PLACEMENT Right 02/09/2019  ? Procedure: CYSTOSCOPY WITH STENT PLACEMENT;  Surgeon: Abbie Sons, MD;  Location: [facility];  Service: Urology;  Laterality: Right;  ?? CYSTOSCOPY/URETEROSCOPY/HOLMIUM LASER/STENT PLACEMENT Right 12/26/2017  ? Procedure: CYSTOSCOPY/URETEROSCOPY/HOLMIUM LASER/STENT PLACEMENT;  Surgeon: Abbie Sons, MD;  Location: [facility];  Service: Urology;  Laterality: Right;  ?? EXTRACORPOREAL SHOCK WAVE LITHOTRIPSY Right 12/04/2017  ? Procedure: EXTRACORPOREAL  SHOCK WAVE LITHOTRIPSY (ESWL);  Surgeon: Hollice Espy, MD;  Location: [facility];  Service: Urology;  Laterality: Right;  ?? PROSTATECTOMY    ?? URETEROSCOPY Right 07/29/2016  ? Procedure: URETEROSCOPY;  Surgeon: Hollice Espy, MD;  Location: [facility];  Service: Urology;  Laterality: Right;  ?? URETEROSCOPY Right 02/09/2019  ? Procedure: URETEROSCOPY- DIAGNOSTIC;  Surgeon: Abbie Sons, MD;  Location: [facility];  Service: Urology;   Laterality: Right;  ? ? ? ?FAMILY HISTORY  ? ?Family History  ?Problem Relation Age of Onset  ?? Stroke Mother   ?? Heart attack Father   ?? Lung cancer Father   ?? Kidney disease Father   ?? Prostate cancer Neg Hx   ?? Bladder Cancer Neg Hx   ? ? ? ?SOCIAL HISTORY  ? ?Social History  ? ?Tobacco Use  ?? Smoking status: Some Days  ?  Packs/day: 0.50  ?  Types: Cigarettes  ?? Smokeless tobacco: Never  ?Vaping Use  ?? Vaping Use: Never used  ?Substance Use Topics  ?? Alcohol use: Yes  ?  Alcohol/week: 14.0 standard drinks  ?  Types: 14 Cans of beer per week  ?? Drug use: Yes  ?  Types: Marijuana  ?  Comment: "sometimes, not very often"  ? ? ? ?MEDICATIONS  ? ? ?Home Medication:  ?Current Outpatient Rx  ?? Order #: [id number] Class: Normal  ?? Order #: [id number] Class: Normal  ?? Order #: [id number] Class: Normal  ? ?  ?Current Medication: ? ?Current Facility-Administered Medications:  ??  acetaminophen (TYLENOL) tablet 650 mg, 650 mg, Oral, Q6H PRN, 650 mg at 03/13/22 1213 **OR** acetaminophen (TYLENOL) suppository 650 mg, 650 mg, Rectal, Q6H PRN, Mansy, Jan A, MD ??  albuterol (PROVENTIL) (2.5 MG/3ML) 0.083% nebulizer solution 3 mL, 3 mL, Inhalation, Q4H PRN, Nicole Kindred A, DO, 3 mL at 03/12/22 1757 ??  atorvastatin (LIPITOR) tablet 80 mg, 80 mg, Oral, Daily, Mansy, Jan A, MD, 80 mg at 03/13/22 0935 ??  azithromycin (ZITHROMAX) tablet 250 mg, 250 mg, Oral, Daily, Nicole Kindred A, DO, 250 mg at 03/13/22 0934 ??  benzonatate (TESSALON) capsule 200 mg, 200 mg, Oral, TID, Mansy, Jan A, MD, 200 mg at 03/13/22 2135 ??  bisacodyl (DULCOLAX) EC tablet 5 mg, 5 mg, Oral, Daily PRN, Nicole Kindred A, DO, 5 mg at 03/09/22 1615 ??  carvedilol (COREG) tablet 12.5 mg, 12.5 mg, Oral, BID WC, Nicole Kindred A, DO, 12.5 mg at 03/13/22 1644 ??  Chlorhexidine Gluconate Cloth 2 % PADS 6 each, 6 each, Topical, Q0600, Wouk, Ailene Rud, MD, 6 each at 03/14/22 0600 ??  furosemide (LASIX) tablet 40 mg, 40 mg, Oral, Daily, Wouk, Ailene Rud, MD ??  ipratropium-albuterol (DUONEB) 0.5-2.5 (3) MG/3ML nebulizer solution 3 mL, 3 mL, Nebulization, Q6H PRN, Ottie Glazier, MD, 3 mL at 03/13/22 2347 ??  loratadine (CLARITIN) tablet 10 mg, 10 mg, Oral, Daily, 10 mg at 03/13/22 0935 **AND** [DISCONTINUED] pseudoephedrine (SUDAFED) 12 hr tablet 120 mg, 120 mg, Oral, BID, Mansy, Jan A, MD, 120 mg at 03/13/22 0933 ??  mupirocin ointment (BACTROBAN) 2 % 1 application., 1 application., Nasal, BID, Wouk, Ailene Rud, MD, 1 application. at 03/13/22 2136 ??  polyethylene glycol (MIRALAX / GLYCOLAX) packet 17 g, 17 g, Oral, Daily, Nicole Kindred A, DO, 17 g at 03/13/22 0936 ??  predniSONE (DELTASONE) tablet 30 mg, 30 mg, Oral, Q breakfast, Wouk, Ailene Rud, MD, 30 mg at 03/13/22 1643 ??  senna-docusate (Senokot-S) tablet 1 tablet, 1 tablet, Oral, BID, Ezekiel Slocumb,  DO, 1 tablet at 03/13/22 2136 ??  traZODone (DESYREL) tablet 50 mg, 50 mg, Oral, QHS PRN, Mansy, Jan A, MD, 50 mg at 03/12/22 2159 ? ? ? ?ALLERGIES  ? ?Patient has no known allergies. ? ? ? ? ?REVIEW OF SYSTEMS  ? ? ?Review of Systems: ? ?Gen:  Denies  fever, sweats, chills weigh loss  ?HEENT: Denies blurred vision, double vision, ear pain, eye pain, hearing loss, nose bleeds, sore throat ?Cardiac:  No dizziness, chest pain or heaviness, chest tightness,edema ?Resp:   reports dyspnea chronically  ?Gi: Denies swallowing difficulty, stomach pain, nausea or vomiting, diarrhea, constipation, bowel incontinence ?Gu:  Denies bladder incontinence, burning urine ?Ext:   Denies Joint pain, stiffness or swelling ?Skin: Denies  skin rash, easy bruising or bleeding or hives ?Endoc:  Denies polyuria, polydipsia , polyphagia or weight change ?Psych:   Denies depression, insomnia or hallucinations  ? ?Other:  All other systems negative ? ? ?VS: BP (!) 149/99   Pulse 87   Temp (!) 97.5 ?F (36.4 ?C)   Resp 18   Ht 5\' 11"  (1.803 m)   Wt 132.7 kg   SpO2 92%   BMI 40.80 kg/m?   ? ? ? ?PHYSICAL EXAM   ? ? ?GENERAL:NAD, no fevers, chills, no weakness no fatigue ?HEAD: Normocephalic, atraumatic.  ?EYES: Pupils equal, round, reactive to light. Extraocular muscles intact. No scleral icterus.  ?MOUTH: Moist mucosal membr

## 2022-03-14 NOTE — Final Consult Note (Signed)
WOC Nurse Consult Note: ?Patient receiving care in [facility]. ?I removed the compression wrap from the RLE. It had been partially removed during a procedure this morning. ?Again today, there are no wounds to the RLE. The swelling to BLE is markedly reduced as the patient has not been sitting 24/7 in a recliner with his legs in a dependent position.  He has SCDs on BLE this morning. He wishes to go without the compression wrap to the RLE, understands the importance of keeping his legs elevated, and knows compression wraps can be resumed should his situation warrant it.  I spoke with Dr. Imagene Riches via [place] about this and he supports this POC. ? ?No compression wrap placed to RLE today. ? ?[place] nurse will not follow at this time.  Please re-consult the [place] team if needed. ? ?Val Riles, RN, MSN, CWOCN, CNS-BC, pager [phone]  ?

## 2022-03-14 NOTE — Progress Notes (Signed)
?Progress Note ? ? ?Patient: Edwin Donovan. AJO:[id number] DOB: 1946/11/13 DOA: 03/09/2022     5 ?DOS: the patient was seen and examined on 03/14/2022 ?  ?Brief hospital course: ?Edwin Donovan. is a 76 y.o. male with past medical history COPD not on home oxygen prior to recent hospital d/c on 9/47/09, chronic diastolic CHF, hypertension and CKD stage II with hx or Right nephrectomy.  He presented to the ED early AM on 03/09/2022 with progressively worsening lower extremity edema, significant orthopnea, dyspnea worsening on exertion over the last couple weeks, in addition to worsening cough with wheezing.   ?  ?ED Course -- RR 22, spO2 95% on 3 L/min [place] O2, otherwise stable vitals.  Labs showed AKI, mild anemia, mild thrombocytopenia, minimally elevated hs-troponin which trended flat. ?  ?Imaging -- ?Chest x-ray - large masslike opacity in the left hilum. ?Chest CTA  - two isolated subsegmental pulmonary emboli at the bifurcation in the right upper lobe pulmonary artery. Overall clot burden is very small.    ?17 mm spiculated nodule in the posterior right upper lobe.  Associated 10.0 x 7.4 cm left mediastinal mass extending into the medial left upper lobe and left perihilar region. This appearance raises concern for primary bronchogenic neoplasm such as small cell lung cancer. ?  ?Admitted to hospitalist service with pulmonology, oncology and palliative care consulted.  ? ? ?Assessment and Plan: ?* Acute pulmonary embolism ([place]) ?Started on heparin, then eliquis, now all on hold pending bronch on Friday, plan for eliquis resumption after procedure. PVL neg for DVT. ? ?COPD with acute exacerbation ([place]) ?Acute hypoxic respiratory failure ?Presented with wheezing and increased O2 requirement.  Started on IV steroids on admission, transitioned to prednisone. ?--Continue prednisone, continue tapering, 30 qd ?--5 days Zithromax 250 mg ordered ?--Duonebs and PRN albuterol nebs ?--Antitussives,  mucolytics ?--Pulmonary hygiene ?--Supplement O2 PRN per protocol ? ?Acute on chronic diastolic CHF (congestive heart failure) ([place]) ?Echo this admission on 4/22 technically limited study with preserved EF, but diastolic parameters indeterminate. ?Started on IV Lasix BID on admission. ?Edema improved ?- resume home lasix 40 qd ?--Toprol-XL 100 mg -- prescribe at d/c ?--Substituting Coreg for Toprol due to BP's elevated with ACEI on hold (AKI).  Can resume metop once ACEI resumed.   ?--Follow renal function, electrolytes ?--Strict I/O's, daily weights ? ?Nodule of upper lobe of right lung ?17 mm spiculated nodule in the posterior right upper lobe. ?Associated 10.0 x 7.4 cm left mediastinal mass extending into the ?medial left upper lobe and left perihilar region. This appearance ?raises concern for primary bronchogenic neoplasm such as small cell ?lung cancer. Mri no signs mets ?-Pulmonary, Oncology, Palliative Care are consulted ?--Cardiology cleared for bronchoscopy to biopsy ?--Bronchoscopy EBUS planned tomorrow ?- outpt f/u dr. Janese Banks of oncology. PET ordered ? ?Obesity, Class III, BMI 40-49.9 (morbid obesity) ([place]) ?Body mass index is 40.53 kg/m?Marland Kitchen ?Complicates overall care and prognosis.  Recommend lifestyle modifications including physical activity and diet for weight loss and overall long-term health. ? ?Lupus erythematosus ?Noted in chart review of [facility] records. ?No apparent acute issues. ? ?Prostate cancer ([place]) ?Noted.  Monitor for obstructive urinary symptoms.   ? ?History of renal carcinoma ?S/p right nephrectomy, now solitary left kidney.  Monitor renal function and urine output closely. ? ?AKI (acute kidney injury) ([place]) ?Vs progression of ckd. Cr was 1 in 2020, here has held steady around 1.5 ?Monitor BMP. ?Avoid nephrotoxins, hypotension ?Maintain MAP>65 ?Renally dose meds ? ?Dyslipidemia ?Continue  statin  ? ?Essential hypertension ?Using Coreg in place of Toprol-XL (while ACEI held due to AKI, BP's  elevated).   ?Zestril held due to AKI.   ?Resume Zestril with renal function improves. ?Adjust antihypertensives PRN. ? ?Constipation ?Requests mag hydroxide addition to regimen, will start that today ? ? ?Subjective: breathing stable. No chest pain. Tolerating diet.  ? ?Physical Exam: ?Vitals:  ? 03/13/22 2323 03/14/22 0419 03/14/22 0745 03/14/22 1241  ?BP: (!) 137/99 135/87 (!) 149/99 (!) 147/94  ?Pulse: 89 88 87 96  ?Resp: 20 20 18 20   ?Temp: 97.9 ?F (36.6 ?C) 97.6 ?F (36.4 ?C) (!) 97.5 ?F (36.4 ?C) (!) 97.5 ?F (36.4 ?C)  ?TempSrc:  Oral Oral   ?SpO2: 93% 91% 92% 91%  ?Weight:  132.7 kg    ?Height:      ? ?General exam: awake, alert, no acute distress, obese, chronically ill-appearing ?HEENT: moist mucus membranes, hearing grossly normal  ?Respiratory system: CTAB but diminished throughout, no wheezes, rales or rhonchi, normal respiratory effort, on 2 L/min [place] O2. ?Cardiovascular system: normal S1/S2, RRR, resolved lower extremity edema.  ?Central nervous system: A&O x 4. no gross focal neurologic deficits, normal speech ?Extremities: trace edema ?Skin: dry, intact, normal temperature ?Psychiatry: normal mood, congruent affect, judgement and insight appear normal ? ? ?Family Communication: none at bedside. No answer when contact gwendolyn telephoned today ? ?Disposition: ?Status is: Inpatient ?Remains inpatient appropriate because: Ongoing diagnostic evaluation not appropriate for outpatient setting ? ? Planned Discharge Destination: Skilled nursing facility ? ?Time spent: 30 minutes ? ?Author: ?Desma Maxim, MD ?03/14/2022 2:13 PM ? ?For on call review [email or web address].  ?

## 2022-03-14 NOTE — Progress Notes (Signed)
?   03/14/22 0800  ?Clinical Encounter Type  ?Visited With Patient  ?Visit Type Initial  ?Referral From Nurse  ?Consult/Referral To Chaplain  ? ?Chaplain responded to nurse consult. Chaplain provided compassionate presence and support. ?

## 2022-03-14 NOTE — Progress Notes (Signed)
Physical Therapy Treatment ?Patient Details ?Name: Edwin Donovan. ?MRN: [id number] ?DOB: 12-17-1945 ?Today's Date: 03/14/2022 ? ? ?History of Present Illness Pt is a 76 year old male presenting to the hospital  with progressively worsening lower extremity edema, significant orthopnea, dyspnea worsening on exertion over the last couple weeks, in addition to worsening cough with wheezing.  Chest x-Ashlye [place] reveals large masslike opacity in the left hilumn chest CTA reveals two isolated subsegmental pulmonary emboli at the bifurcation in the right upper lobe pulmonary artery. Overall clot burden is very small.     17 mm spiculated nodule in the posterior right upper lobe.  Associated 10.0 x 7.4 cm left mediastinal mass extending into the medial left upper lobe and left perihilar region. This appearance raises concern for primary bronchogenic neoplasm such as small cell lung cancer.PMH significant for COPD not on home oxygen prior to recent hospital d/c on 11/26/30, chronic diastolic CHF, hypertension and CKD stage II with hx or Right nephrectomy ? ?  ?PT Comments  ? ? Pt is making gradual progress towards goals with ability to sit at EOB with ease. Several attempts for transfers, however not successful. +2 assist required given by RN staff. Able to perform seated there-ex. Will continue to progress as able. All mobility performed on 3L of O2.   ?Recommendations for follow up therapy are one component of a multi-disciplinary discharge planning process, led by the attending physician.  Recommendations may be updated based on patient status, additional functional criteria and insurance authorization. ? ?Follow Up Recommendations ? Skilled nursing-short term rehab (<3 hours/day) ?  ?  ?Assistance Recommended at Discharge Frequent or constant Supervision/Assistance  ?Patient can return home with the following Two people to help with walking and/or transfers;Two people to help with bathing/dressing/bathroom;Help with stairs  or ramp for entrance ?  ?Equipment Recommendations ? None recommended by PT  ?  ?Recommendations for Other Services   ? ? ?  ?Precautions / Restrictions Precautions ?Precautions: Fall ?Restrictions ?Weight Bearing Restrictions: No  ?  ? ?Mobility ? Bed Mobility ?Overal bed mobility: Needs Assistance ?Bed Mobility: Supine to Sit ?  ?  ?Supine to sit: Supervision ?Sit to supine: Min guard ?  ?General bed mobility comments: supervision for B LE clearance during sliding off and bringing up to bed level. Safe technique and able to maintain seated balance for extended time throughout session ?  ? ?Transfers ?Overall transfer level: Needs assistance ?Equipment used: Rolling walker (2 wheels) ?Transfers: Sit to/from Stand, Bed to chair/wheelchair/BSC ?Sit to Stand: Max assist, +2 physical assistance, From elevated surface ?Stand pivot transfers: Max assist, +2 physical assistance ?  ?  ?  ? Lateral/Scoot Transfers: Mod assist, +2 physical assistance, From elevated surface ?General transfer comment: multiple transfer attempts performed this session, however R LE extremely weak and unable to fully lift buttocks off bed during standing attempt. Pt able to partially scoot over laterally to chair, however gets stuck 1/2 way and requires assist for return back bed. +2 for scooting hips back to bed. ?  ? ?Ambulation/Gait ?  ?  ?  ?  ?  ?  ?  ?General Gait Details: not performed ? ? ?Stairs ?  ?  ?  ?  ?  ? ? ?Wheelchair Mobility ?  ? ?Modified Rankin (Stroke Patients Only) ?  ? ? ?  ?Balance Overall balance assessment: Needs assistance ?Sitting-balance support: Feet supported ?Sitting balance-Leahy Scale: Good ?  ?  ?  ?  ?Standing balance comment: unable to  achieve buttock clearance ?  ?  ?  ?  ?  ?  ?  ?  ?  ?  ?  ?  ? ?  ?Cognition Arousal/Alertness: Awake/alert ?Behavior During Therapy: [facility] for tasks assessed/performed ?Overall Cognitive Status: Within Functional Limits for tasks assessed ?  ?  ?  ?  ?  ?  ?  ?  ?  ?  ?  ?  ?   ?  ?  ?  ?General Comments: very pleasant and agreeable to therapy ?  ?  ? ?  ?Exercises Other Exercises ?Other Exercises: seated ther-ex performed on B LE including alt marching and LAQ ?Other Exercises: Attempted multiple transfers including SPT to recliner, BSC, and lateral scoot to recliner. Unable to perform despite max assist +2. ? ?  ?General Comments   ?  ?  ? ?Pertinent Vitals/Pain Pain Assessment ?Pain Assessment: No/denies pain  ? ? ?Home Living   ?  ?  ?  ?  ?  ?  ?  ?  ?  ?   ?  ?Prior Function    ?  ?  ?   ? ?PT Goals (current goals can now be found in the care plan section) Acute Rehab PT Goals ?Patient Stated Goal: to get stronger first ?PT Goal Formulation: With patient ?Time For Goal Achievement: 03/27/22 ?Potential to Achieve Goals: Fair ?Progress towards PT goals: Progressing toward goals ? ?  ?Frequency ? ? ? Min 2X/week ? ? ? ?  ?PT Plan Current plan remains appropriate  ? ? ?Co-evaluation   ?  ?  ?  ?  ? ?  ?AM-PAC PT "6 Clicks" Mobility   ?Outcome Measure ? Help needed turning from your back to your side while in a flat bed without using bedrails?: A Little ?Help needed moving from lying on your back to sitting on the side of a flat bed without using bedrails?: A Little ?Help needed moving to and from a bed to a chair (including a wheelchair)?: Total ?Help needed standing up from a chair using your arms (e.g., wheelchair or bedside chair)?: Total ?Help needed to walk in hospital room?: Total ?Help needed climbing 3-5 steps with a railing? : Total ?6 Click Score: 10 ? ?  ?End of Session Equipment Utilized During Treatment: Gait belt;Oxygen ?Activity Tolerance: Patient limited by fatigue ?Patient left: in bed;with bed alarm set ?Nurse Communication: Mobility status ?PT Visit Diagnosis: Unsteadiness on feet (R26.81);Muscle weakness (generalized) (M62.81);History of falling (Z91.81);Difficulty in walking, not elsewhere classified (R26.2) ?  ? ? ?Time: 1610-9604 ?PT Time Calculation (min) (ACUTE  ONLY): 35 min ? ?Charges:  $Therapeutic Exercise: 8-22 mins ?$Therapeutic Activity: 8-22 mins          ?          ? ?Greggory Stallion, PT, DPT, GCS ?[phone] ? ? ? ?Alexandar Weisenberger ?03/14/2022, 2:49 PM ? ?

## 2022-03-15 ENCOUNTER — Inpatient Hospital Stay: Payer: Medicare Other

## 2022-03-15 ENCOUNTER — Encounter
Admission: EM | Disposition: A | Discharge status: Hospice | Payer: Self-pay | Source: Home / Self Care | Attending: Obstetrics and Gynecology

## 2022-03-15 ENCOUNTER — Inpatient Hospital Stay: Payer: Medicare Other | Admitting: Anesthesiology

## 2022-03-15 ENCOUNTER — Encounter: Payer: Self-pay | Admitting: Family Medicine

## 2022-03-15 DIAGNOSIS — R911 Solitary pulmonary nodule: Secondary | ICD-10-CM | POA: Diagnosis not present

## 2022-03-15 DIAGNOSIS — I2699 Other pulmonary embolism without acute cor pulmonale: Secondary | ICD-10-CM | POA: Diagnosis not present

## 2022-03-15 HISTORY — PX: VIDEO BRONCHOSCOPY WITH ENDOBRONCHIAL NAVIGATION: SHX6175

## 2022-03-15 LAB — BASIC METABOLIC PANEL
Anion gap: 9 (ref 5–15)
BUN: 70 mg/dL — ABNORMAL HIGH (ref 8–23)
CO2: 33 mmol/L — ABNORMAL HIGH (ref 22–32)
Calcium: 9.2 mg/dL (ref 8.9–10.3)
Chloride: 95 mmol/L — ABNORMAL LOW (ref 98–111)
Creatinine, Ser: 1.79 mg/dL — ABNORMAL HIGH (ref 0.61–1.24)
GFR, Estimated: 39 mL/min — ABNORMAL LOW (ref >60)
Glucose, Bld: 111 mg/dL — ABNORMAL HIGH (ref 70–99)
Potassium: 3.8 mmol/L (ref 3.5–5.1)
Sodium: 137 mmol/L (ref 135–145)

## 2022-03-15 SURGERY — VIDEO BRONCHOSCOPY WITH ENDOBRONCHIAL NAVIGATION
Anesthesia: General | Laterality: Left

## 2022-03-15 MED ORDER — LACTATED RINGERS IV BOLUS
500.0000 mL | Freq: Once | INTRAVENOUS | Status: AC
  Administered 2022-03-15: 500 mL via INTRAVENOUS

## 2022-03-15 MED ORDER — LIDOCAINE HCL (CARDIAC) PF 100 MG/5ML IV SOSY
PREFILLED_SYRINGE | INTRAVENOUS | Status: DC | PRN
  Administered 2022-03-15: 100 mg via INTRAVENOUS

## 2022-03-15 MED ORDER — PROPOFOL 10 MG/ML IV BOLUS
INTRAVENOUS | Status: DC | PRN
Start: 2022-03-15 — End: 2022-03-15
  Administered 2022-03-15: 150 mg via INTRAVENOUS

## 2022-03-15 MED ORDER — FENTANYL CITRATE (PF) 100 MCG/2ML IJ SOLN
INTRAMUSCULAR | Status: AC
  Filled 2022-03-15: qty 2

## 2022-03-15 MED ORDER — ALBUTEROL SULFATE (2.5 MG/3ML) 0.083% IN NEBU
INHALATION_SOLUTION | RESPIRATORY_TRACT | Status: AC
  Administered 2022-03-15: 2.5 mg via RESPIRATORY_TRACT
  Filled 2022-03-15: qty 3

## 2022-03-15 MED ORDER — MIDAZOLAM HCL 2 MG/2ML IJ SOLN
INTRAMUSCULAR | Status: AC
Start: 2022-03-15 — End: ?
  Filled 2022-03-15: qty 2

## 2022-03-15 MED ORDER — APIXABAN 5 MG PO TABS: ORAL_TABLET | ORAL | Status: AC

## 2022-03-15 MED ORDER — PHENYLEPHRINE HCL (PRESSORS) 10 MG/ML IV SOLN
INTRAVENOUS | Status: DC | PRN
  Administered 2022-03-15: 400 ug via INTRAVENOUS

## 2022-03-15 MED ORDER — ONDANSETRON HCL 4 MG/2ML IJ SOLN
INTRAMUSCULAR | Status: AC
  Filled 2022-03-15: qty 4

## 2022-03-15 MED ORDER — SODIUM CHLORIDE (PF) 0.9 % IJ SOLN
INTRAMUSCULAR | Status: AC
  Filled 2022-03-15: qty 10

## 2022-03-15 MED ORDER — DEXAMETHASONE SODIUM PHOSPHATE 10 MG/ML IJ SOLN
INTRAMUSCULAR | Status: AC
  Filled 2022-03-15: qty 2

## 2022-03-15 MED ORDER — ALBUTEROL SULFATE (2.5 MG/3ML) 0.083% IN NEBU: 2.5000 mg | INHALATION_SOLUTION | Freq: Once | RESPIRATORY_TRACT | Status: AC

## 2022-03-15 MED ORDER — MIDAZOLAM HCL 2 MG/2ML IJ SOLN
INTRAMUSCULAR | Status: DC | PRN
  Administered 2022-03-15: 1 mg via INTRAVENOUS

## 2022-03-15 MED ORDER — PREDNISONE 20 MG PO TABS
20.0000 mg | ORAL_TABLET | Freq: Every day | ORAL | Status: DC
  Administered 2022-03-18: 20 mg via ORAL
  Filled 2022-03-15: qty 2

## 2022-03-15 MED ORDER — KETAMINE HCL 50 MG/ML IJ SOLN
INTRAMUSCULAR | Status: AC
  Filled 2022-03-15: qty 1

## 2022-03-15 MED ORDER — ONDANSETRON HCL 4 MG/2ML IJ SOLN
INTRAMUSCULAR | Status: DC | PRN
  Administered 2022-03-15: 4 mg via INTRAVENOUS

## 2022-03-15 MED ORDER — LIDOCAINE HCL (PF) 2 % IJ SOLN
INTRAMUSCULAR | Status: AC
Start: 2022-03-15 — End: ?
  Filled 2022-03-15: qty 10

## 2022-03-15 MED ORDER — VASOPRESSIN 20 UNIT/ML IV SOLN
INTRAVENOUS | Status: DC | PRN
  Administered 2022-03-15: 4 [IU] via INTRAVENOUS
  Administered 2022-03-15 (×3): 1 [IU] via INTRAVENOUS

## 2022-03-15 MED ORDER — EPHEDRINE SULFATE (PRESSORS) 50 MG/ML IJ SOLN
INTRAMUSCULAR | Status: DC | PRN
  Administered 2022-03-15: 10 mg via INTRAVENOUS

## 2022-03-15 MED ORDER — SUCCINYLCHOLINE CHLORIDE 200 MG/10ML IV SOSY
PREFILLED_SYRINGE | INTRAVENOUS | Status: AC
  Filled 2022-03-15: qty 10

## 2022-03-15 MED ORDER — ROCURONIUM BROMIDE 100 MG/10ML IV SOLN
INTRAVENOUS | Status: DC | PRN
  Administered 2022-03-15: 40 mg via INTRAVENOUS
  Administered 2022-03-15: 10 mg via INTRAVENOUS

## 2022-03-15 MED ORDER — SUGAMMADEX SODIUM 500 MG/5ML IV SOLN
INTRAVENOUS | Status: DC | PRN
  Administered 2022-03-15: 300 mg via INTRAVENOUS

## 2022-03-15 MED ORDER — LACTATED RINGERS IV SOLN: INTRAVENOUS | Status: DC | PRN

## 2022-03-15 MED ORDER — SENNOSIDES-DOCUSATE SODIUM 8.6-50 MG PO TABS: 1.0000 | ORAL_TABLET | Freq: Two times a day (BID) | ORAL | Status: AC

## 2022-03-15 MED ORDER — PROPOFOL 500 MG/50ML IV EMUL
INTRAVENOUS | Status: AC
  Filled 2022-03-15: qty 50

## 2022-03-15 MED ORDER — ROCURONIUM BROMIDE 10 MG/ML (PF) SYRINGE
PREFILLED_SYRINGE | INTRAVENOUS | Status: AC
  Filled 2022-03-15: qty 10

## 2022-03-15 MED ORDER — DEXAMETHASONE SODIUM PHOSPHATE 10 MG/ML IJ SOLN
INTRAMUSCULAR | Status: DC | PRN
Start: 2022-03-15 — End: 2022-03-15
  Administered 2022-03-15: 5 mg via INTRAVENOUS

## 2022-03-15 MED ORDER — LACTATED RINGERS IV BOLUS
250.0000 mL | Freq: Once | INTRAVENOUS | Status: AC
  Administered 2022-03-15: 250 mL via INTRAVENOUS

## 2022-03-15 MED ORDER — FLEET ENEMA 7-19 GM/118ML RE ENEM
1.0000 | ENEMA | Freq: Every day | RECTAL | Status: DC | PRN
  Administered 2022-03-16: 1 via RECTAL

## 2022-03-15 MED ORDER — SUCCINYLCHOLINE CHLORIDE 200 MG/10ML IV SOSY
PREFILLED_SYRINGE | INTRAVENOUS | Status: DC | PRN
  Administered 2022-03-15: 140 mg via INTRAVENOUS

## 2022-03-15 MED ORDER — FENTANYL CITRATE (PF) 100 MCG/2ML IJ SOLN
INTRAMUSCULAR | Status: DC | PRN
Start: 2022-03-15 — End: 2022-03-15
  Administered 2022-03-15: 50 ug via INTRAVENOUS
  Administered 2022-03-15: 25 ug via INTRAVENOUS

## 2022-03-15 MED ORDER — BISACODYL 5 MG PO TBEC: 5.0000 mg | DELAYED_RELEASE_TABLET | Freq: Every day | ORAL | 0 refills | Status: AC | PRN

## 2022-03-15 MED ORDER — PREDNISONE 10 MG PO TABS
ORAL_TABLET | ORAL | 0 refills | Status: AC
Start: 2022-03-15 — End: ?

## 2022-03-15 MED ORDER — TRAZODONE HCL 100 MG PO TABS
200.0000 mg | ORAL_TABLET | Freq: Every evening | ORAL | Status: DC | PRN
  Administered 2022-03-15 – 2022-03-27 (×7): 200 mg via ORAL
  Filled 2022-03-15 (×8): qty 2

## 2022-03-15 NOTE — Progress Notes (Signed)
?   03/15/22 1500  ?Clinical Encounter Type  ?Visited With Patient and family together  ?Visit Type Initial  ?Referral From Nurse  ? ?Chaplain facilitated completion of Advance Directive ?

## 2022-03-15 NOTE — Consult Note (Addendum)
? ?  [facility] CM Inpatient Consult ? ? ?03/15/2022 ? ?[name] ?02/26/1946 ?[id number] ? ?*Coverage for [facility] Liaison ? ?[place] Organization [ACO] Patient: Edwin Donovan ? ?Primary Care Provider:  System, Provider Not In, encounters note [place] ? ? ?Patient screened for less than 30 days readmission hospitalization with noted high risk score for unplanned readmission risk and  to assess for potential [place] [facility] service needs for post hospital transition.  Review of patient's medical record reveals patient is for a skilled nursing facility level of care for transition. ? ? ?Plan:  Patient post hospital needs are to be met at a skilled nursing facility level of care and no referral [facility] CM at this current time due to transition is not a [facility] affiliated facility at this time. ? ?For questions contact:  ? ?Natividad Brood, RN BSN CCM ?[facility]iaison ? [phone] business mobile phone ?Toll free office [phone]  ?Fax number: [phone] ?[email or web address] ?[email or web address]  ? ? ? ?

## 2022-03-15 NOTE — Progress Notes (Signed)
? ? ? ?PULMONOLOGY ? ? ? ? ? ? ? ? ?Date: 03/15/2022,   ?MRN# [id number] Edwin Donovan. [birth year] ? ? ?  ?AdmissionWeight: (!) 141.5 kg                 ?CurrentWeight: 128.9 kg ? ?Referring provider: Dr. Arbutus Ped ? ? ?CHIEF COMPLAINT:  ? ?Nonmassive PE with RUL nodule and mediastinal lymphadenopathy ? ? ?HISTORY OF PRESENT ILLNESS  ? ?This is a pleasant 76 year old male with a history of prostate CA, disorder, arthritis, CHF, CKD COPD, dysrhythmia history of nephrolithiasis and essential previous episode of pneumonia in 2017, he reports worsening cough and wheezing.  He was found to be mildly tachypneic on arrival and required 3 L of supplemental oxygen to reach normoxia at SPO2 95%.  Labs showed CKD however troponins were essentially normal in the context of CKD with only mild elevation.  Influenza and COVID-19 are negative.  CTA was performed with findings of subsegmental pulmonary emboli as well as 17 mm spiculated nodule of the right upper lobe and associated mediastinal lymphadenopathy/mass.  He had nephrectomy in the past due to renal cell carcinoma.  He quit smoking 2 years ago. PCCM consultation was placed for additional evaluation and management.  ? ?03/14/22- patient was seen in pre-op had no overnight events. He had anesthesia screening.  For bronchoscopy this am ? ?PAST MEDICAL HISTORY  ? ?Past Medical History:  ?Diagnosis Date  ?? Anxiety   ? ptsd  ?? Arthritis   ?? Cancer [facility])   ? PROSTATE  ?? CHF (congestive heart failure) ([place])   ?? Chronic kidney disease   ? stones  ?? COPD (chronic obstructive pulmonary disease) ([place])   ?? Dysrhythmia   ?? History of kidney stones   ?? Hypertension   ?? Kidney stone   ?? Pneumonia   ? 06/27/16  ?? Shortness of breath dyspnea   ? ? ? ?SURGICAL HISTORY  ? ?Past Surgical History:  ?Procedure Laterality Date  ?? CYSTOSCOPY W/ RETROGRADES Bilateral 12/26/2017  ? Procedure: CYSTOSCOPY WITH RETROGRADE PYELOGRAM;  Surgeon: Abbie Sons, MD;  Location: [facility] ORS;   Service: Urology;  Laterality: Bilateral;  ?? CYSTOSCOPY W/ RETROGRADES Right 02/09/2019  ? Procedure: CYSTOSCOPY WITH RETROGRADE PYELOGRAM;  Surgeon: Abbie Sons, MD;  Location: [facility] OR[facility];  Service: Urology;  Laterality: Right;  ?? CYSTOSCOPY W/ URETERAL STENT PLACEMENT Right 07/29/2016  ? Procedure: CYSTOSCOPY WITH STENT REPLACEMENT;  Surgeon: Hollice Espy, MD;  Location: [facility];  Service: Urology;  Laterality: Right;  ?? CYSTOSCOPY WITH BIOPSY Right 07/29/2016  ? Procedure: CYSTOSCOPY WITH BIOPSY;  Surgeon: Hollice Espy, MD;  Location: [facility] OR[facility];  Service: Urology;  Laterality: Right;  ?? CYSTOSCOPY WITH BIOPSY Right 02/09/2019  ? Procedure: CYSTOSCOPY WITH BIOPSY;  Surgeon: Abbie Sons, MD;  Location: [facility];  Service: Urology;  Laterality: Right;  ?? CYSTOSCOPY WITH STENT PLACEMENT Right 06/28/2016  ? Procedure: CYSTOSCOPY WITH STENT PLACEMENT;  Surgeon: Hollice Espy, MD;  Location: [facility];  Service: Urology;  Laterality: Right;  ?? CYSTOSCOPY WITH STENT PLACEMENT Right 02/09/2019  ? Procedure: CYSTOSCOPY WITH STENT PLACEMENT;  Surgeon: Abbie Sons, MD;  Location: [facility];  Service: Urology;  Laterality: Right;  ?? CYSTOSCOPY/URETEROSCOPY/HOLMIUM LASER/STENT PLACEMENT Right 12/26/2017  ? Procedure: CYSTOSCOPY/URETEROSCOPY/HOLMIUM LASER/STENT PLACEMENT;  Surgeon: Abbie Sons, MD;  Location: [facility];  Service: Urology;  Laterality: Right;  ?? EXTRACORPOREAL SHOCK WAVE LITHOTRIPSY Right 12/04/2017  ? Procedure: EXTRACORPOREAL SHOCK WAVE LITHOTRIPSY (ESWL);  Surgeon: Hollice Espy, MD;  Location: [facility];  Service: Urology;  Laterality: Right;  ?? PROSTATECTOMY    ?? URETEROSCOPY Right 07/29/2016  ? Procedure: URETEROSCOPY;  Surgeon: Hollice Espy, MD;  Location: [facility];  Service: Urology;  Laterality: Right;  ?? URETEROSCOPY Right 02/09/2019  ? Procedure: URETEROSCOPY- DIAGNOSTIC;  Surgeon: Abbie Sons, MD;  Location: [facility];  Service: Urology;  Laterality: Right;  ? ? ? ?FAMILY  HISTORY  ? ?Family History  ?Problem Relation Age of Onset  ?? Stroke Mother   ?? Heart attack Father   ?? Lung cancer Father   ?? Kidney disease Father   ?? Prostate cancer Neg Hx   ?? Bladder Cancer Neg Hx   ? ? ? ?SOCIAL HISTORY  ? ?Social History  ? ?Tobacco Use  ?? Smoking status: Some Days  ?  Packs/day: 0.50  ?  Types: Cigarettes  ?? Smokeless tobacco: Never  ?Vaping Use  ?? Vaping Use: Never used  ?Substance Use Topics  ?? Alcohol use: Yes  ?  Alcohol/week: 14.0 standard drinks  ?  Types: 14 Cans of beer per week  ?? Drug use: Yes  ?  Types: Marijuana  ?  Comment: "sometimes, not very often"  ? ? ? ?MEDICATIONS  ? ? ?Home Medication:  ?Current Outpatient Rx  ?? Order #: [id number] Class: Normal  ?? Order #: [id number] Class: Normal  ?? Order #: [id number] Class: Normal  ? ?  ?Current Medication: ? ?Current Facility-Administered Medications:  ??  [MAR Hold] acetaminophen (TYLENOL) tablet 650 mg, 650 mg, Oral, Q6H PRN, 650 mg at 03/13/22 1213 **OR** [MAR Hold] acetaminophen (TYLENOL) suppository 650 mg, 650 mg, Rectal, Q6H PRN, Mansy, Arvella Merles, MD ??  [MAR Hold] albuterol (PROVENTIL) (2.5 MG/3ML) 0.083% nebulizer solution 3 mL, 3 mL, Inhalation, Q4H PRN, Nicole Kindred A, DO, 3 mL at 03/15/22 0041 ??  [MAR Hold] atorvastatin (LIPITOR) tablet 80 mg, 80 mg, Oral, Daily, Mansy, Jan A, MD, 80 mg at 03/14/22 1001 ??  [MAR Hold] azithromycin (ZITHROMAX) tablet 250 mg, 250 mg, Oral, Daily, Nicole Kindred A, DO, 250 mg at 03/14/22 1001 ??  [MAR Hold] benzonatate (TESSALON) capsule 200 mg, 200 mg, Oral, TID, Mansy, Jan A, MD, 200 mg at 03/14/22 2211 ??  [MAR Hold] bisacodyl (DULCOLAX) EC tablet 5 mg, 5 mg, Oral, Daily PRN, Nicole Kindred A, DO, 5 mg at 03/14/22 1001 ??  butamben-tetracaine-benzocaine (CETACAINE) spray 1 spray, 1 spray, Topical, Once, Ottie Glazier, MD ??  [MAR Hold] carvedilol (COREG) tablet 12.5 mg, 12.5 mg, Oral, BID WC, Nicole Kindred A, DO, 12.5 mg at 03/14/22 1734 ??  [MAR Hold] Chlorhexidine  Gluconate Cloth 2 % PADS 6 each, 6 each, Topical, Q0600, Gwynne Edinger, MD, 6 each at 03/15/22 [phone] ??  [MAR Hold] ipratropium-albuterol (DUONEB) 0.5-2.5 (3) MG/3ML nebulizer solution 3 mL, 3 mL, Nebulization, Q6H PRN, Lanney Gins, Laquasia Pincus, MD, 3 mL at 03/15/22 0727 ??  lidocaine (PF) (XYLOCAINE) 1 % injection 30 mL, 30 mL, Infiltration, Once, Ottie Glazier, MD ??  lidocaine (XYLOCAINE) 2 % jelly 1 application., 1 application., Topical, Once, Ottie Glazier, MD ??  [MAR Hold] loratadine (CLARITIN) tablet 10 mg, 10 mg, Oral, Daily, 10 mg at 03/14/22 1001 **AND** [DISCONTINUED] pseudoephedrine (SUDAFED) 12 hr tablet 120 mg, 120 mg, Oral, BID, Mansy, Jan A, MD, 120 mg at 03/13/22 0933 ??  [MAR Hold] magnesium hydroxide (MILK OF MAGNESIA) suspension 30 mL, 30 mL, Oral, Daily, Wouk, Ailene Rud, MD, 30 mL at 03/14/22 1734 ??  [MAR Hold] mupirocin ointment (BACTROBAN) 2 % 1 application.,  1 application., Nasal, BID, Wouk, Ailene Rud, MD, 1 application. at 03/14/22 2211 ??  phenylephrine (NEO-SYNEPHRINE) 0.25 % nasal spray 1 spray, 1 spray, Each Nare, Q6H PRN, Ottie Glazier, MD ??  [MAR Hold] polyethylene glycol (MIRALAX / GLYCOLAX) packet 17 g, 17 g, Oral, Daily, Nicole Kindred A, DO, 17 g at 03/14/22 1002 ??  [MAR Hold] predniSONE (DELTASONE) tablet 30 mg, 30 mg, Oral, Q breakfast, Wouk, Ailene Rud, MD, 30 mg at 03/14/22 1001 ??  [MAR Hold] senna-docusate (Senokot-S) tablet 1 tablet, 1 tablet, Oral, BID, Ezekiel Slocumb, DO, 1 tablet at 03/14/22 2212 ??  [MAR Hold] traZODone (DESYREL) tablet 50 mg, 50 mg, Oral, QHS PRN, Mansy, Jan A, MD, 50 mg at 03/14/22 2212 ? ? ? ?ALLERGIES  ? ?Patient has no known allergies. ? ? ? ? ?REVIEW OF SYSTEMS  ? ? ?Review of Systems: ? ?Gen:  Denies  fever, sweats, chills weigh loss  ?HEENT: Denies blurred vision, double vision, ear pain, eye pain, hearing loss, nose bleeds, sore throat ?Cardiac:  No dizziness, chest pain or heaviness, chest tightness,edema ?Resp:   reports  dyspnea chronically  ?Gi: Denies swallowing difficulty, stomach pain, nausea or vomiting, diarrhea, constipation, bowel incontinence ?Gu:  Denies bladder incontinence, burning urine ?Ext:   Denies Joint pain, stiffn

## 2022-03-15 NOTE — Procedures (Signed)
THERAPEUTIC ASPIRATION OF TRACHEOBRONCHIAL TREE BRONCHOSCOPY PROCEDURE NOTE ? ?FIBEROPTIC BRONCHOSCOPY WITH BRONCHOALVEOLAR LAVAGE PROCEDURE NOTE ? ?ENDOBRONCHIAL ULTRASOUND PROCEDURE NOTE ? ? ? ?Flexible bronchoscopy was performed  by : Lanney Gins MD ? ?assistance by : 1)Repiratory therapist  and 2)[employer] staff and 3) Anesthesia team and 4) Flouroscopy team and 5) [place] supporting staff ? ? ?Indication for the procedure was : ? ?Pre-procedural H&P. ?The following assessment was performed on the day of the procedure prior to initiating sedation ?History:  ?Chest pain n ?Dyspnea y ?Hemoptysis n ?Cough y ?Fever n ?Other pertinent items n ? ?Examination ?Vital signs -reviewed as per nursing documentation today ?Cardiac    Murmurs: n ? Rubs : n ? Gallop: n ?Lungs Wheezing: n ?Rales : n ?Rhonchi :y ? ?Other pertinent findings: SOB/hypoxemia due to chronic lung disease  ? ?Pre-procedural assessment for Procedural Sedation included: ?Depth of sedation: As per anesthesia team  ?ASA Classification:  2 ?Mallampati airway assessment: 3 ? ? ? ?Medication list reviewed: y ? ?The patient?s interval history was taken and revealed: no new complaints ?The pre- procedure physical examination revealed: No new findings ?Refer to prior clinic note for details. ? ?Informed Consent: ?Informed consent was obtained from:  patient after explanation of procedure and risks, benefits, as well as alternative procedures available.  Explanation of level of sedation and possible transfusion was also provided.  ?  ?Procedural Preparation: ?Time out was performed and patient was identified by name and birthdate and procedure to be performed and side for sampling, if any, was specified. ?Pt was intubated by anesthesia. ? ?The patient was appropriately draped. ? ? ?Fiberoptic bronchoscopy with airway inspection, Therapeutic aspiration of tracheobronchial tree and BAL Procedure findings: ? ?Bronchoscope was inserted via ETT  without  difficulty.  Posterior oropharynx, epiglottis, arytenoids, false cords and vocal cords were not visualized as these were bypassed by endotracheal tube. The distal trachea was normal in circumference and appearance without mucosal, cartilaginous or branching abnormalities.  The main carina was mildly splayed . ?All right and left lobar airways were visualized to the Subsegmental level.  Sub- sub segmental carinae were identified in all the distal airways.   ?Secretions were visible in the following airways and appeared to be clear.  ?The mucosa was : friable at RUL ? ?Airways were notable for:  ?      exophytic lesions :n ?      extrinsic compression in the following distributions: n. ?      Friable mucosa: y ?      Anthrocotic material /pigmentation: n ?  ? ? ?Post procedure Diagnosis:   Thickened mucus plugging bilaterally worse on right. Numerous times aspirated to clear deeply impacted inspissated plugs of mucus from RUL, RML and RLL as well as left mainstem bronchus ? ? ?CYTOBRUSH AT LEFT UPPER LOBE X 2  = ATYPICAL CELLS NOTED BY [facility] ? ?WANG NEEDLE OF LEFT UPPER LOBE LESION X 2  ? ? ?SURGICAL BIOPSY OF LEFT UPPER LOBE X 6 - LESIONAL CANCER CELLS ? ? ? ?Post procedure diagnosis: LUNG CANCER ? ? ? ? ? ?Endobronchial ultrasound assisted hilar and mediastinal lymph node biopsies procedure findings: ?The fiberoptic bronchoscope was removed and the EBUS scope was introduced. Examination began to evaluate for pathologically enlarged lymph nodes starting on the RIGHT  side progressing to the LEFT ?side.  All lymph node biopsies performed with 21G needle. Lymph node biopsies were sent in cytolite for all stations. ? ?STATION 7 X3 BIOPSIES ?STATION 10L X 3BIOPSIES ?  STATION 11L X4 BIOPSIES - LESIONAL  ? ? ?Post procedure diagnosis:  METASTATIC LUNG CANCER ? ? ?Specimens obtained included: ?                  ?Cytology brushes : LEFT UPPER LOBE - ATYPICAL ? ?Broncho-alveolar lavage site:LUL  sent for CYTOLOGY                             ? ?27m volume infused ?15 ml volume returned with CELLULAR appearance ? ? ?Fluoroscopy Used: NONE ;        Pictorial documentation attached: NONE   ?               ?Transbronchial WANG needle aspiration site: LEFT UPPER LOBE  sent for: CYTOLOGY                                    ? ? ? ?Immediate sampling complications included:NONE  ?Epinephrine NONE ml was used topically ? ?The bronchoscopy was terminated due to completion of the planned procedure and the bronchoscope was removed.  ? ?Total dosage of Lidocaine was 0.6 ? ? mg ?Total fluoroscopy time was ZERO minutes ? ?Supplemental oxygen was provided at ANESTHESIA DISCRETION  ? ?Estimated Blood loss: 5cc. ? ?Complications included: ? NONE IMMEDIATE  ? ?Preliminary CXR findings :  ?IN PROCESS ? ?Disposition: STILL INPATIENT ? ?Follow up with Dr. ALanney Ginsin 5 days for result discussion. ? ? ? ? ?FOttie GlazierMD  ?[facility]?Division of Pulmonary & Critical Care Medicine ? ? ?

## 2022-03-15 NOTE — Anesthesia Procedure Notes (Signed)
Procedure Name: Intubation ?Date/Time: 03/15/2022 8:10 AM ?Performed by: Lia Foyer, CRNA ?Pre-anesthesia Checklist: Patient identified, Emergency Drugs available, Suction available and Patient being monitored ?Patient Re-evaluated:Patient Re-evaluated prior to induction ?Oxygen Delivery Method: Circle system utilized ?Preoxygenation: Pre-oxygenation with 100% oxygen ?Induction Type: IV induction ?Ventilation: Mask ventilation without difficulty ?Laryngoscope Size: McGraph and 4 ?Grade View: Grade I ?Tube type: Oral ?Tube size: 8.5 mm ?Number of attempts: 1 ?Airway Equipment and Method: Stylet and Video-laryngoscopy ?Placement Confirmation: ETT inserted through vocal cords under direct vision, positive ETCO2 and breath sounds checked- equal and bilateral ?Secured at: 22 cm ?Tube secured with: Tape ?Dental Injury: Teeth and Oropharynx as per pre-operative assessment  ?Difficulty Due To: Difficulty was anticipated ?Future Recommendations: Recommend- induction with short-acting agent, and alternative techniques readily available ?Comments: Difficult airway due to tenuous respiratory status, 89-90% on 4 L/min [place] baseline after nebulizer by respiratory therapy ? ? ? ? ?

## 2022-03-15 NOTE — Anesthesia Preprocedure Evaluation (Addendum)
Anesthesia Evaluation  ?Patient identified by MRN, date of birth, ID band ?Patient awake ? ? ? ?Reviewed: ?Allergy & Precautions, NPO status , Patient's Chart, lab work & pertinent test results ? ?History of Anesthesia Complications ?Negative for: history of anesthetic complications ? ?Airway ?Mallampati: III ? ? ? ? ? ? Dental ? ?(+) Edentulous Upper ?  ?Pulmonary ?shortness of breath, neg sleep apnea, COPD,  COPD inhaler, Current Smoker,  ?Left lung mass ? ?On 3-4L [place] ? ?Acute PE: two isolated subsegmental pulmonary emboli at the bifurcation in the right upper lobe pulmonary artery ? ?CT 4/27: ?IMPRESSION: ?1. 11.0 x 7.6 x 10.0 cm infiltrative mass centered in the perihilar ?aspect of the left lung, predominantly in the left upper lobe, with ?evidence of direct mediastinal invasion, intimately associated with ?both the left main pulmonary artery, lateral aspect of the aortic ?arch and the left atrial appendage with small amount of adjacent ?pericardial fluid/thickening, suggesting potential pericardial ?involvement. Findings are highly concerning for primary bronchogenic ?neoplasm. ?2. Small aggressive appearing pulmonary nodule in the right upper ?lobe which could represent a metastatic lesion or separate primary ?bronchogenic neoplasm. ?3. Atelectasis and consolidation in the left lower lobe with small ?partially loculated left pleural effusion. ?4. Aortic atherosclerosis, in addition to left main and three-vessel ?coronary artery disease. Assessment for potential risk factor ?modification, dietary therapy or pharmacologic therapy may be ?warranted, if clinically indicated. ?5. Diffuse bronchial wall thickening with mild to moderate ?centrilobular and paraseptal emphysema; imaging findings suggestive ?of underlying COPD. ? Thick sputum on chest, 4L [place] ? ? ?+ decreased breath sounds ? ? ? ? ? Cardiovascular ?Exercise Tolerance: Poor ?hypertension, Pt. on medications and Pt. on  home beta blockers ?+CHF  ?(-) Past MI + dysrhythmias (-) Valvular Problems/Murmurs ?- Peripheral Edema ?Mediastinal mass ?  ?Neuro/Psych ?neg Seizures Anxiety   ? GI/Hepatic ?Neg liver ROS, neg GERD  ,  ?Endo/Other  ?neg diabetes ? Renal/GU ?Renal InsufficiencyRenal disease (stones, mass)Nephrectomy in the past due to renal cell carcinoma  ? ?H/o prostate cancer ? ?  ?Musculoskeletal ?Lumbar degenerative disc disease ?Chronic pain syndrome ? ?Lupus erythematosus  ? Abdominal ?Normal abdominal exam  (+)   ?Peds ? Hematology ? ?(+) Blood dyscrasia, anemia ,   ?Anesthesia Other Findings ?Pt with progressively worsening lower extremity edema, significant orthopnea, dyspnea worsening on exertion over the last couple weeks, in addition to worsening cough with wheezing. He was admitted for COPD exacerbation and Acute on chronic diastolic CHF recently.  ? Reproductive/Obstetrics ? ?  ? ? ? ? ? ? ? ? ? ? ? ? ? ?  ?  ? ? ? ? ? ? ?Anesthesia Physical ? ?Anesthesia Plan ? ?ASA: 4 ? ?Anesthesia Plan: General  ? ?Post-op Pain Management: Minimal or no pain anticipated  ? ?Induction: Intravenous ? ?PONV Risk Score and Plan: 1 and Ondansetron and Dexamethasone ? ?Airway Management Planned: Oral ETT ? ?Additional Equipment:  ? ?Intra-op Plan:  ? ?Post-operative Plan: Extubation in OR ? ?Informed Consent: I have reviewed the patients History and Physical, chart, labs and discussed the procedure including the risks, benefits and alternatives for the proposed anesthesia with the patient or authorized representative who has indicated his/her understanding and acceptance.  ? ? ? ?Dental advisory given ? ?Plan Discussed with: CRNA and Anesthesiologist ? ?Anesthesia Plan Comments:   ? ? ? ? ?Anesthesia Quick Evaluation ? ?

## 2022-03-15 NOTE — Care Management Important Message (Signed)
Important Message ? ?Patient Details  ?Name: Edwin Donovan. ?MRN: [id number] ?Date of Birth: [birth year] ? ? ?Medicare Important Message Given:  Yes ? ? ? ? ?[name] ?03/15/2022, 3:00 PM ?

## 2022-03-15 NOTE — Progress Notes (Signed)
?Progress Note ? ? ?Patient: Edwin Donovan. TFT:[id number] DOB: 06-Oct-1946 DOA: 03/09/2022     6 ?DOS: the patient was seen and examined on 03/15/2022 ?  ?Brief hospital course: ?Edwin Donovan. is a 76 y.o. male with past medical history COPD not on home oxygen prior to recent hospital d/c on 05/23/22, chronic diastolic CHF, hypertension and CKD stage II with hx or Right nephrectomy.  He presented to the ED early AM on 03/09/2022 with progressively worsening lower extremity edema, significant orthopnea, dyspnea worsening on exertion over the last couple weeks, in addition to worsening cough with wheezing.   ?  ?ED Course -- RR 22, spO2 95% on 3 L/min [place] O2, otherwise stable vitals.  Labs showed AKI, mild anemia, mild thrombocytopenia, minimally elevated hs-troponin which trended flat. ?  ?Imaging -- ?Chest x-ray - large masslike opacity in the left hilum. ?Chest CTA  - two isolated subsegmental pulmonary emboli at the bifurcation in the right upper lobe pulmonary artery. Overall clot burden is very small.    ?17 mm spiculated nodule in the posterior right upper lobe.  Associated 10.0 x 7.4 cm left mediastinal mass extending into the medial left upper lobe and left perihilar region. This appearance raises concern for primary bronchogenic neoplasm such as small cell lung cancer. ?  ?Admitted to hospitalist service with pulmonology, oncology and palliative care consulted.  ? ? ?Assessment and Plan: ?* Acute pulmonary embolism ([place]) ?Started on heparin, then eliquis, now all on hold pending bronch on Friday, plan for eliquis resumption after procedure, pulm advises waiting until tomorrow. PVL neg for DVT. ? ?COPD with acute exacerbation ([place]) ?Acute hypoxic respiratory failure ?Presented with wheezing and increased O2 requirement.  Started on IV steroids on admission, transitioned to prednisone. ?--Continue prednisone, continue tapering, will reduce to 20 ?--5 days Zithromax 250 mg ordered ?--Duonebs  and PRN albuterol nebs ?--Antitussives, mucolytics ?--Pulmonary hygiene ?--Supplement O2 PRN per protocol ? ?Acute on chronic diastolic CHF (congestive heart failure) ([place]) ?Echo this admission on 4/22 technically limited study with preserved EF, but diastolic parameters indeterminate. ?Started on IV Lasix BID on admission. ?Edema improved ?- lasix on hold now 2/2 hypotension ?--Toprol-XL 100 mg -- prescribe at d/c if bp improves, currently bb on hold ?--Strict I/O's, daily weights ? ?Nodule of upper lobe of right lung ?17 mm spiculated nodule in the posterior right upper lobe. ?Associated 10.0 x 7.4 cm left mediastinal mass extending into the ?medial left upper lobe and left perihilar region. This appearance ?raises concern for primary bronchogenic neoplasm such as small cell ?lung cancer. Mri no signs mets. Bronch with biopsies 4/28 ?-Pulmonary, Oncology, Palliative Care are consulted ?- outpt f/u dr. Janese Banks of oncology. PET ordered ? ?Obesity, Class III, BMI 40-49.9 (morbid obesity) ([place]) ?Body mass index is 40.53 kg/m?[place] Kitchen ?Complicates overall care and prognosis.  Recommend lifestyle modifications including physical activity and diet for weight loss and overall long-term health. ? ?Lupus erythematosus ?Noted in chart review of [facility] records. ?No apparent acute issues. ? ?Prostate cancer ([place]) ?Noted.  Monitor for obstructive urinary symptoms.   ? ?History of renal carcinoma ?S/p right nephrectomy, now solitary left kidney.  Monitor renal function and urine output closely. ? ?AKI (acute kidney injury) ([place]) ?Vs progression of ckd. Cr was 1 in 2020, here has held steady around 1.5 ?Monitor BMP. ?Avoid nephrotoxins, hypotension ?Maintain MAP>65 ?Renally dose meds ? ?Dyslipidemia ?Continue statin  ? ?Essential hypertension ?Using Coreg in place of Toprol-XL (while ACEI held due to AKI,  BP's elevated).   ?Zestril held due to AKI.   ?Resume Zestril with renal function improves. ?Adjust antihypertensives  PRN. ? ?Constipation ?Requests mag hydroxide addition to regimen, have started that ? ? ?Subjective: breathing stable. No chest pain. Tolerating diet. Tolerated procedure ? ?Physical Exam: ?Vitals:  ? 03/15/22 1045 03/15/22 1100 03/15/22 1115 03/15/22 1141  ?BP: (!) 84/59 98/62 98/62  (!) 84/57  ?Pulse: 100 95 99 96  ?Resp: 16 14 15 17   ?Temp:    (!) 97.5 ?F (36.4 ?C)  ?TempSrc:      ?SpO2: (!) 88% (!) 89% 90% 95%  ?Weight:      ?Height:      ? ?General exam: awake, alert, no acute distress, obese, chronically ill-appearing ?HEENT: moist mucus membranes, hearing grossly normal  ?Respiratory system: CTAB but diminished throughout, no wheezes, rales or rhonchi, normal respiratory effort, on 2 L/min [place] O2. ?Cardiovascular system: normal S1/S2, RRR, resolved lower extremity edema.  ?Central nervous system: A&O x 4. no gross focal neurologic deficits, normal speech ?Extremities: trace edema ?Skin: dry, intact, normal temperature ?Psychiatry: normal mood, congruent affect, judgement and insight appear normal ? ? ?Family Communication: sig other gwendolyn updated @ bedside. Chaplain consult for hcpoa papers, pt wants her to be hcpoa ? ?Disposition: ?Status is: Inpatient ?Remains inpatient appropriate because: monitoring post-procedure ? ? Planned Discharge Destination: Skilled nursing facility ? ?Time spent: 30 minutes ? ?Author: ?Desma Maxim, MD ?03/15/2022 1:39 PM ? ?For on call review [email or web address].  ?

## 2022-03-15 NOTE — TOC Progression Note (Addendum)
Transition of Care (TOC) - Progression Note  ? ? ?Patient Details  ?Name: Edwin Donovan. ?MRN: [id number] ?Date of Birth: [birth year] ? ?Transition of Care (TOC) CM/SW Contact  ?Candie Chroman, LCSW ?Phone Number: ?03/15/2022, 8:43 AM ? ?Clinical Narrative:   Insurance authorization approved for [employer]: [id number]. Valid 4/28-5/2.  ? ?12:40 pm: Per MD, not stable for discharge today. He will complete discharge summary today in preparation for discharge to SNF tomorrow. ? ?3:54 pm: Patient completed consent to treat forms for [facility]. Sent back to admissions coordinator. ? ?Expected Discharge Plan: [place] ?Barriers to Discharge: Continued Medical Work up ? ?Expected Discharge Plan and Services ?Expected Discharge Plan: [place] ?  ?  ?Post Acute Care Choice: [place] ?Living arrangements for the past 2 months: [place] ?Expected Discharge Date: 03/12/22               ?  ?  ?  ?  ?  ?  ?  ?  ?  ?  ? ? ?Social Determinants of Health (SDOH) Interventions ?  ? ?Readmission Risk Interventions ? ?  03/13/2022  ?  3:42 PM  ?Readmission Risk Prevention Plan  ?Transportation Screening Complete  ?PCP or Specialist Appt within 3-5 Days Complete  ?[place] or Home Care Consult Complete  ?Social Work Consult for [place] Planning/Counseling Complete  ?Palliative Care Screening Not Applicable  ?Medication Review Press photographer) Complete  ? ? ?

## 2022-03-15 NOTE — Anesthesia Postprocedure Evaluation (Signed)
Anesthesia Post Note ? ?Patient: Edwin Donovan. ? ?Procedure(s) Performed: VIDEO BRONCHOSCOPY WITH ENDOBRONCHIAL NAVIGATION (Left) ? ?Patient location during evaluation: PACU ?Anesthesia Type: General ?Level of consciousness: awake and alert ?Pain management: pain level controlled ?Vital Signs Assessment: post-procedure vital signs reviewed and stable ?Respiratory status: spontaneous breathing, nonlabored ventilation, respiratory function stable and patient connected to nasal cannula oxygen ?Cardiovascular status: blood pressure returned to baseline and stable ?Postop Assessment: no apparent nausea or vomiting ?Anesthetic complications: no ? ? ?No notable events documented. ? ? ?Last Vitals:  ?Vitals:  ? 03/15/22 1141 03/15/22 1406  ?BP: (!) 84/57 92/60  ?Pulse: 96 100  ?Resp: 17 20  ?Temp: (!) 36.4 ?C 36.7 ?C  ?SpO2: 95% 90%  ?  ?Last Pain:  ?Vitals:  ? 03/15/22 1100  ?TempSrc:   ?PainSc: 0-No pain  ? ? ?  ?  ?  ?  ?  ?  ? ?[place] Ouch ? ? ? ? ?

## 2022-03-15 NOTE — Transfer of Care (Signed)
Immediate Anesthesia Transfer of Care Note ? ?Patient: Edwin Donovan. ? ?Procedure(s) Performed: VIDEO BRONCHOSCOPY WITH ENDOBRONCHIAL NAVIGATION (Left) ? ?Patient Location: PACU ? ?Anesthesia Type:General ? ?Level of Consciousness: drowsy ? ?Airway & Oxygen Therapy: Patient Spontanous Breathing and Patient connected to face mask oxygen ? ?Post-op Assessment: Report given to RN and Post -op Vital signs reviewed and stable ? ?Post vital signs: Reviewed and stable ? ?Last Vitals:  ?Vitals Value Taken Time  ?BP 98/68 03/15/22 0938  ?Temp    ?Pulse 99 03/15/22 0943  ?Resp 16 03/15/22 0945  ?SpO2 90 % 03/15/22 0943  ?Vitals shown include unvalidated device data. ? ?Last Pain:  ?Vitals:  ? 03/15/22 0743  ?TempSrc: Oral  ?PainSc: 0-No pain  ?   ? ?  ? ?Complications: No notable events documented. ?

## 2022-03-15 NOTE — Discharge Summary (Signed)
Edwin Donovan. KKX:[id number] DOB: Jun 25, 1946 DOA: 03/09/2022 ? ?PCP: System, Provider Not In ? ?Admit date: 03/09/2022 ?Discharge date: 03/15/2022 ? ?Time spent: 45 minutes ? ?Recommendations for Outpatient Follow-up:  ?Oncology f/u one week, needs to call and schedule  ? ? ? ?Discharge Diagnoses:  ?Principal Problem: ?  Acute pulmonary embolism ([place]) ?Active Problems: ?  COPD with acute exacerbation ([place]) ?  Acute on chronic diastolic CHF (congestive heart failure) ([place]) ?  Nodule of upper lobe of right lung ?  Chronic pain syndrome ?  Essential hypertension ?  Dyslipidemia ?  AKI (acute kidney injury) ([place]) ?  History of renal carcinoma ?  Prostate cancer ([place]) ?  Lupus erythematosus ?  Mediastinal mass ?  Palliative care encounter ?  Obesity, Class III, BMI 40-49.9 (morbid obesity) ([place]) ? ? ?Discharge Condition: stable ? ?Diet recommendation: heart healthy ? ?Filed Weights  ? 03/14/22 0419 03/15/22 0400 03/15/22 0743  ?Weight: 132.7 kg 128.9 kg 131.5 kg  ? ? ?History of present illness:  ?From admission h and p ?Edwin Donovan. is a 76 y.o. male with medical history significant for COPD, CHF, hypertension and CKD, who presented to the ER with acute onset of worsening lower extremity edema with associated orthopnea and dyspnea worsening on exertion over the last couple weeks.  He admitted to worsening cough as well as wheezing.  His symptoms have been getting significantly worse over the last couple of days.  No fever or chills.  No nausea or vomiting or abdominal pain.  He denies any chest pain or palpitations.  No dysuria, oliguria or hematuria or flank pain.  No bleeding diathesis. ? ?Hospital Course:  ?Patient presented with respiratory symptoms. Found to have new PE and lung malignancy. No evidence right heart strain on TTE. Also with new O2 requirement. Underwent bronchoscopy with pulmonology on 4/27, findings concerning for malignancy. Pathology is pending. MRI of brain showed no meds. Oncology  saw patient, advises close outpatient f/u, will need outpatient PET scan as well. Patient was treated for copd exacerbation with 5 days azithromycin and oral steroids with taper which he will continue at rehab. Evaluated by PT and advised SNF which is where patient will discharge. Relative hypotension here, home beta blocker, diuretic, and acei all on hold, can consider adding those back as needed.  ? ?Procedures: ?Bronchoscopy 4/28  ? ?Consultations: ?Pulmonology, oncology ? ?Discharge Exam: ?Vitals:  ? 03/15/22 1115 03/15/22 1141  ?BP: 98/62 (!) 84/57  ?Pulse: 99 96  ?Resp: 15 17  ?Temp:  (!) 97.5 ?F (36.4 ?C)  ?SpO2: 90% 95%  ? ? ?General exam: awake, alert, no acute distress, obese, chronically ill-appearing ?HEENT: moist mucus membranes, hearing grossly normal  ?Respiratory system: CTAB but diminished throughout, no wheezes, rales or rhonchi, normal respiratory effort, on 2 L/min [place] O2. ?Cardiovascular system: normal S1/S2, RRR, resolved lower extremity edema.  ?Central nervous system: A&O x 4. no gross focal neurologic deficits, normal speech ?Extremities: trace edema ?Skin: dry, intact, normal temperature ?Psychiatry: normal mood, congruent affect, judgement and insight appear normal ? ?Discharge Instructions ? ? ? ?Allergies as of 03/15/2022   ?No Known Allergies ?  ? ?  ?Medication List  ?  ? ?STOP taking these medications   ? ?furosemide 40 MG tablet ?Commonly known as: LASIX ?  ?lisinopril 40 MG tablet ?Commonly known as: ZESTRIL ?  ?metoprolol succinate 100 MG 24 hr tablet ?Commonly known as: TOPROL-XL ?  ? ?  ? ?TAKE these medications   ? ?  albuterol 108 (90 Base) MCG/ACT inhaler ?Commonly known as: VENTOLIN HFA ?Inhale 1-2 puffs into the lungs every 6 (six) hours as needed for wheezing or shortness of breath. ?  ?apixaban 5 MG Tabs tablet ?Commonly known as: ELIQUIS ?2 tablets  by mouth twice a day for 11 doses, then 1 tablet by mouth twice a day ?  ?atorvastatin 80 MG tablet ?Commonly known as:  LIPITOR ?TAKE ONE-HALF TABLET BY MOUTH AT BEDTIME FOR CHOLESTEROL ?  ?benzonatate 200 MG capsule ?Commonly known as: TESSALON ?Take 1 capsule (200 mg total) by mouth 3 (three) times daily. ?  ?bisacodyl 5 MG EC tablet ?Commonly known as: DULCOLAX ?Take 1 tablet (5 mg total) by mouth daily as needed for moderate constipation. ?  ?budesonide-formoterol 160-4.5 MCG/ACT inhaler ?Commonly known as: SYMBICORT ?Inhale 2 puffs into the lungs 2 (two) times daily. ?  ?cetirizine 10 MG tablet ?Commonly known as: ZYRTEC ?Take 1 tablet by mouth daily. ?  ?polyethylene glycol powder 17 GM/SCOOP powder ?Commonly known as: GLYCOLAX/MIRALAX ?MIX 17GM (1 CAPFUL) BY MOUTH EVERY DAY AS NEEDED MIX 17 GRAMS (USE BOTTLE CAP) IN LIQUID OF CHOICE AS DIRECTED FOR CONSTIPATION ?  ?predniSONE 10 MG tablet ?Commonly known as: DELTASONE ?2 tabs daily for 2 days, then 1 tab daily for 3 days ?  ?senna-docusate 8.6-50 MG tablet ?Commonly known as: Senokot-S ?Take 1 tablet by mouth 2 (two) times daily. ?  ?traZODone 50 MG tablet ?Commonly known as: DESYREL ?Take 1 tablet by mouth at bedtime as needed. ?  ? ?  ? ?No Known Allergies ? Contact information for after-discharge care   ? ? Destination   ? ? [facility] .   ?Service: Skilled Nursing ?Contact information: ?[address] ?[place] ?[phone] ? ?  ?  ? ?  ?  ? ?  ?  ? ?  ? ? ? ?The results of significant diagnostics from this hospitalization (including imaging, microbiology, ancillary and laboratory) are listed below for reference.   ? ?Significant Diagnostic Studies: ?DG Chest 1 View ? ?Result Date: 03/09/2022 ?CLINICAL DATA:  Shortness of breath and lower extremity edema EXAM: CHEST  1 VIEW COMPARISON:  02/27/2022 FINDINGS: There is a large area of opacity at the left hilum which is unchanged since 02/27/2022, but new since 07/23/2016. There is left basilar atelectasis. Mild chronic interstitial opacity. IMPRESSION: Large masslike  opacity of the left hilum. Chest CT with contrast is recommended for more complete characterization of likely neoplasm. Electronically Signed   By: Ulyses Jarred M.D.   On: 03/09/2022 00:36  ? ?CT Angio Chest PE W and/or Wo Contrast ? ?Result Date: 03/09/2022 ?CLINICAL DATA:  Lung mass on chest radiograph. Shortness of breath. Leg swelling. Evaluate for PE. EXAM: CT ANGIOGRAPHY CHEST WITH CONTRAST TECHNIQUE: Multidetector CT imaging of the chest was performed using the standard protocol during bolus administration of intravenous contrast. Multiplanar CT image reconstructions and MIPs were obtained to evaluate the vascular anatomy. RADIATION DOSE REDUCTION: This exam was performed according to the departmental dose-optimization program which includes automated exposure control, adjustment of the mA and/or kV according to patient size and/or use of iterative reconstruction technique. CONTRAST:  76mL OMNIPAQUE IOHEXOL 350 MG/ML SOLN COMPARISON:  Chest radiograph dated 03/09/2022. FINDINGS: Cardiovascular: Satisfactory opacification of the bilateral pulmonary arteries to the lobar level. Two isolated subsegmental pulmonary emboli at a bifurcation in the right upper lobe pulmonary artery (series 6/image 219). Overall clot burden is very small. Truncation of the lobar  left upper lobe pulmonary artery by the dominant mass. Although not tailored for evaluation of the thoracic aorta, there is no evidence of thoracic aortic aneurysm or dissection. Atherosclerotic calcifications of the arch. The heart is normal in size.  No pericardial effusion. Three vessel coronary atherosclerosis. Mediastinum/Nodes: 10.0 x 7.4 cm left mediastinal mass extending into the medial left upper lobe (series 6/image 157) and the left perihilar region. Visualized thyroid is unremarkable. Lungs/Pleura: 17 mm spiculated nodule in the posterior right upper lobe (series 5/image 23). Scarring/atelectasis in the lingula and bilateral lower lobes, left  lower lobe predominant. Moderate centrilobular and paraseptal emphysematous changes, upper lung predominant. No pleural effusion or pneumothorax. Upper Abdomen: Visualized upper abdomen is grossly unremarkable, n

## 2022-03-16 ENCOUNTER — Inpatient Hospital Stay: Payer: Medicare Other

## 2022-03-16 DIAGNOSIS — I2699 Other pulmonary embolism without acute cor pulmonale: Secondary | ICD-10-CM | POA: Diagnosis not present

## 2022-03-16 LAB — APTT: aPTT: >160 seconds (ref 24–36)

## 2022-03-16 LAB — BLOOD GAS, VENOUS
Acid-Base Excess: 11.1 mmol/L — ABNORMAL HIGH (ref 0.0–2.0)
Bicarbonate: 40.7 mmol/L — ABNORMAL HIGH (ref 20.0–28.0)
O2 Saturation: 40 %
Patient temperature: 37
pCO2, Ven: 79 mmHg (ref 44–60)
pH, Ven: 7.32 (ref 7.25–7.43)

## 2022-03-16 LAB — GLUCOSE, CAPILLARY: Glucose-Capillary: 124 mg/dL — ABNORMAL HIGH (ref 70–99)

## 2022-03-16 LAB — BASIC METABOLIC PANEL
Anion gap: 8 (ref 5–15)
BUN: 93 mg/dL — ABNORMAL HIGH (ref 8–23)
CO2: 35 mmol/L — ABNORMAL HIGH (ref 22–32)
Calcium: 9 mg/dL (ref 8.9–10.3)
Chloride: 92 mmol/L — ABNORMAL LOW (ref 98–111)
Creatinine, Ser: 2.69 mg/dL — ABNORMAL HIGH (ref 0.61–1.24)
GFR, Estimated: 24 mL/min — ABNORMAL LOW (ref >60)
Glucose, Bld: 129 mg/dL — ABNORMAL HIGH (ref 70–99)
Potassium: 4.4 mmol/L (ref 3.5–5.1)
Sodium: 135 mmol/L (ref 135–145)

## 2022-03-16 LAB — BLOOD GAS, ARTERIAL
Acid-Base Excess: 7.8 mmol/L — ABNORMAL HIGH (ref 0.0–2.0)
Bicarbonate: 36.1 mmol/L — ABNORMAL HIGH (ref 20.0–28.0)
O2 Content: 6 L/min
O2 Saturation: 97 %
Patient temperature: 37
pCO2 arterial: 67 mmHg (ref 32–48)
pH, Arterial: 7.34 — ABNORMAL LOW (ref 7.35–7.45)
pO2, Arterial: 83 mmHg (ref 83–108)

## 2022-03-16 LAB — BRAIN NATRIURETIC PEPTIDE: B Natriuretic Peptide: 30.7 pg/mL (ref 0.0–100.0)

## 2022-03-16 LAB — CBC
HCT: 39.5 % (ref 39.0–52.0)
Hemoglobin: 12.1 g/dL — ABNORMAL LOW (ref 13.0–17.0)
MCH: 30.2 pg (ref 26.0–34.0)
MCHC: 30.6 g/dL (ref 30.0–36.0)
MCV: 98.5 fL (ref 80.0–100.0)
Platelets: 153 10*3/uL (ref 150–400)
RBC: 4.01 MIL/uL — ABNORMAL LOW (ref 4.22–5.81)
RDW: 14.5 % (ref 11.5–15.5)
WBC: 11.9 10*3/uL — ABNORMAL HIGH (ref 4.0–10.5)
nRBC: 0 % (ref 0.0–0.2)

## 2022-03-16 LAB — TROPONIN I (HIGH SENSITIVITY): Troponin I (High Sensitivity): 95 ng/L — ABNORMAL HIGH (ref <18)

## 2022-03-16 LAB — LACTIC ACID, PLASMA: Lactic Acid, Venous: 1.1 mmol/L (ref 0.5–1.9)

## 2022-03-16 LAB — HEPARIN LEVEL (UNFRACTIONATED): Heparin Unfractionated: >1.1 IU/mL — ABNORMAL HIGH (ref 0.30–0.70)

## 2022-03-16 MED ORDER — HEPARIN (PORCINE) 25000 UT/250ML-% IV SOLN
1350.0000 [IU]/h | INTRAVENOUS | Status: DC
  Administered 2022-03-16: 1150 [IU]/h via INTRAVENOUS
  Filled 2022-03-16: qty 250

## 2022-03-16 MED ORDER — LACTATED RINGERS IV BOLUS
500.0000 mL | Freq: Once | INTRAVENOUS | Status: AC
  Administered 2022-03-16: 500 mL via INTRAVENOUS

## 2022-03-16 MED ORDER — TAMSULOSIN HCL 0.4 MG PO CAPS
0.4000 mg | ORAL_CAPSULE | Freq: Every day | ORAL | Status: DC
  Administered 2022-03-16 – 2022-03-26 (×7): 0.4 mg via ORAL
  Filled 2022-03-16 (×8): qty 1

## 2022-03-16 MED ORDER — CHLORHEXIDINE GLUCONATE CLOTH 2 % EX PADS
6.0000 | MEDICATED_PAD | Freq: Every day | CUTANEOUS | Status: DC
  Administered 2022-03-16 – 2022-03-27 (×11): 6 via TOPICAL

## 2022-03-16 MED ORDER — HEPARIN BOLUS VIA INFUSION
6000.0000 [IU] | Freq: Once | INTRAVENOUS | Status: AC
  Administered 2022-03-16: 6000 [IU] via INTRAVENOUS
  Filled 2022-03-16: qty 6000

## 2022-03-16 MED ORDER — SODIUM CHLORIDE 0.9 % IV SOLN: INTRAVENOUS | Status: DC

## 2022-03-16 MED ORDER — HEPARIN (PORCINE) 25000 UT/250ML-% IV SOLN
1600.0000 [IU]/h | INTRAVENOUS | Status: DC
  Administered 2022-03-16: 1600 [IU]/h via INTRAVENOUS
  Filled 2022-03-16: qty 250

## 2022-03-16 MED ORDER — CHLORHEXIDINE GLUCONATE 0.12 % MT SOLN
15.0000 mL | Freq: Two times a day (BID) | OROMUCOSAL | Status: DC
  Administered 2022-03-17 – 2022-03-27 (×18): 15 mL via OROMUCOSAL
  Filled 2022-03-16 (×21): qty 15

## 2022-03-16 MED ORDER — ORAL CARE MOUTH RINSE
15.0000 mL | Freq: Two times a day (BID) | OROMUCOSAL | Status: DC
  Administered 2022-03-17 – 2022-03-26 (×13): 15 mL via OROMUCOSAL

## 2022-03-16 NOTE — Progress Notes (Signed)
Staff  preparing to clean patient after minimal stool from enema and Patient stated, "I feel like I am about to die". HOB elevated and held incontinent care. An immediate assessment was done and patient did appear different from his prior state at the start of the shift, lung sound were wet, but all the vital signs were stable. RT was called for a breathing treatment per patient's request and patient stated he felt better after the treatment. However, patient did not appear well. Secure chat sent to NP to possible have CXR done earlier than 9am. Charge nurse was consulted. The NP arrived to the bedside and additional orders received.  ? 03/16/22 0648  ?Assess: MEWS Score  ?Temp 99.1 ?F (37.3 ?C)  ?BP (!) 76/52  ?Pulse Rate (!) 101  ?ECG Heart Rate 98  ?Resp 19  ?Level of Consciousness Alert  ?SpO2 97 %  ?O2 Device HFNC  ?O2 Flow Rate (L/min) 6 L/min  ?Assess: MEWS Score  ?MEWS Temp 0  ?MEWS Systolic 2  ?MEWS Pulse 0  ?MEWS RR 0  ?MEWS LOC 0  ?MEWS Score 2  ?MEWS Score Color Yellow  ?Assess: if the MEWS score is Yellow or Red  ?Were vital signs taken at a resting state? Yes  ?Focused Assessment Change from prior assessment (see assessment flowsheet)  ?Early Detection of Sepsis Score *See Row Information* Low  ?MEWS guidelines implemented *See Row Information* Yes  ?Treat  ?MEWS Interventions Administered prn meds/treatments;Escalated (See documentation below);Consulted Respiratory Therapy  ?Pain Scale 0-10  ?Pain Score 0  ?Take Vital Signs  ?Increase Vital Sign Frequency  Yellow: Q 2hr X 2 then Q 4hr X 2, if remains yellow, continue Q 4hrs  ?Escalate  ?MEWS: Escalate Yellow: discuss with charge nurse/RN and consider discussing with provider and RRT  ?Notify: Charge Nurse/RN  ?Name of Charge Nurse/RN Notified Felcia RN  ?Date Charge Nurse/RN Notified 03/16/22  ?Time Charge Nurse/RN Notified 0630  ?Notify: Provider  ?Provider Name/Title Pablo Ledger. NP  ?Date Provider Notified 03/16/22  ?Time Provider Notified 0630   ?Notification Type Call  ?Notification Reason Change in status  ?Provider response En route  ?Date of Provider Response 03/16/22  ?Time of Provider Response 0630  ?Document  ?Patient Outcome Transferred/level of care increased  ?Progress note created (see row info) Yes  ?Assess: SIRS CRITERIA  ?SIRS Temperature  0  ?SIRS Pulse 1  ?SIRS Respirations  0  ?SIRS WBC 0  ?SIRS Score Sum  1  ? ? ?

## 2022-03-16 NOTE — Progress Notes (Signed)
Cross Cover ?Patient had complained of his breathing not feeling right and that he felt lijke he was going to die. Symptoms improved post breathing treatment but oxygen requirements increased from 4 L [place] to 10 L HFNC.  Also became hypotensive ?ABG ?STAT chest ?BIPAP ?Fluid bolus 500 LR ?

## 2022-03-16 NOTE — TOC Progression Note (Addendum)
Transition of Care (TOC) - Progression Note  ? ? ?Patient Details  ?Name: Edwin Donovan. ?MRN: [id number] ?Date of Birth: 1945/11/22 ? ?Transition of Care (TOC) CM/SW Contact  ?McQue[place], LCSWA ?Phone Number: ?03/16/2022, 10:12 AM ? ?Clinical Narrative:    ? ?TOC planned for patient to be discharged to [facility] today. Per provider, patient not medically ready to discharge to SNF today.  ? ?TOC to follow up with provider Monday to see patient's condition and updated estimated discharge date. ? ? ?Expected Discharge Plan: [place] ?Barriers to Discharge: Continued Medical Work up ? ?Expected Discharge Plan and Services ?Expected Discharge Plan: [place] ?  ?  ?Post Acute Care Choice: [place] ?Living arrangements for the past 2 months: [place] ?Expected Discharge Date: 03/12/22               ?  ?  ?  ?  ?  ?  ?  ?  ?  ?  ? ? ?Social Determinants of Health (SDOH) Interventions ?  ? ?Readmission Risk Interventions ? ?  03/13/2022  ?  3:42 PM  ?Readmission Risk Prevention Plan  ?Transportation Screening Complete  ?PCP or Specialist Appt within 3-5 Days Complete  ?[place] or Home Care Consult Complete  ?Social Work Consult for [place] Planning/Counseling Complete  ?Palliative Care Screening Not Applicable  ?Medication Review Press photographer) Complete  ? ? ?

## 2022-03-16 NOTE — Progress Notes (Signed)
Patient had an uneventful night. Enema administered as ordered with minimal results noted. CHG bath provided. Safety maintained. Call bell kept within reach. C/o pain 4/10. PRN tylenol administered. Will continue to monitor and endorse.  ?

## 2022-03-16 NOTE — Progress Notes (Signed)
? ? ? ?PULMONOLOGY ? ? ? ? ? ? ? ? ?Date: 03/16/2022,   ?MRN# [id number] Edwin Donovan. Feb 13, 1946 ? ? ?  ?AdmissionWeight: (!) 141.5 kg                 ?CurrentWeight: 132 kg ? ?Referring provider: Dr. Arbutus Ped ? ? ?CHIEF COMPLAINT:  ? ?Nonmassive PE with RUL nodule and mediastinal lymphadenopathy ? ? ?HISTORY OF PRESENT ILLNESS  ? ?This is a pleasant 76 year old male with a history of prostate CA, disorder, arthritis, CHF, CKD COPD, dysrhythmia history of nephrolithiasis and essential previous episode of pneumonia in 2017, he reports worsening cough and wheezing.  He was found to be mildly tachypneic on arrival and required 3 L of supplemental oxygen to reach normoxia at SPO2 95%.  Labs showed CKD however troponins were essentially normal in the context of CKD with only mild elevation.  Influenza and COVID-19 are negative.  CTA was performed with findings of subsegmental pulmonary emboli as well as 17 mm spiculated nodule of the right upper lobe and associated mediastinal lymphadenopathy/mass.  He had nephrectomy in the past due to renal cell carcinoma.  He quit smoking 2 years ago. PCCM consultation was placed for additional evaluation and management.  ? ? ?03/15/22- patient is in Step down unit due to worsening respiratory status overnight. He is able to speak states he was unable to sleep due to anxiety. His ABG shows hypercania worse from previous.  He was still sedated quite a bit after procedure for some time I suspect that lead to hypopnea and hypercarbia. He is now improved and completely lucid able to communicate normally. He does have Fiance at bedside and she thinks he looks better.  Will repeat blood gas via VBG today. Would ideally like to get him OOB to chair.  ? ? ? ?PAST MEDICAL HISTORY  ? ?Past Medical History:  ?Diagnosis Date  ?? Anxiety   ? ptsd  ?? Arthritis   ?? Cancer [facility])   ? PROSTATE  ?? CHF (congestive heart failure) ([place])   ?? Chronic kidney disease   ? stones  ?? COPD (chronic  obstructive pulmonary disease) ([place])   ?? Dysrhythmia   ?? History of kidney stones   ?? Hypertension   ?? Kidney stone   ?? Pneumonia   ? 06/27/16  ?? Shortness of breath dyspnea   ? ? ? ?SURGICAL HISTORY  ? ?Past Surgical History:  ?Procedure Laterality Date  ?? CYSTOSCOPY W/ RETROGRADES Bilateral 12/26/2017  ? Procedure: CYSTOSCOPY WITH RETROGRADE PYELOGRAM;  Surgeon: Abbie Sons, MD;  Location: [facility];  Service: Urology;  Laterality: Bilateral;  ?? CYSTOSCOPY W/ RETROGRADES Right 02/09/2019  ? Procedure: CYSTOSCOPY WITH RETROGRADE PYELOGRAM;  Surgeon: Abbie Sons, MD;  Location: [facility];  Service: Urology;  Laterality: Right;  ?? CYSTOSCOPY W/ URETERAL STENT PLACEMENT Right 07/29/2016  ? Procedure: CYSTOSCOPY WITH STENT REPLACEMENT;  Surgeon: Hollice Espy, MD;  Location: [facility];  Service: Urology;  Laterality: Right;  ?? CYSTOSCOPY WITH BIOPSY Right 07/29/2016  ? Procedure: CYSTOSCOPY WITH BIOPSY;  Surgeon: Hollice Espy, MD;  Location: [facility];  Service: Urology;  Laterality: Right;  ?? CYSTOSCOPY WITH BIOPSY Right 02/09/2019  ? Procedure: CYSTOSCOPY WITH BIOPSY;  Surgeon: Abbie Sons, MD;  Location: [facility];  Service: Urology;  Laterality: Right;  ?? CYSTOSCOPY WITH STENT PLACEMENT Right 06/28/2016  ? Procedure: CYSTOSCOPY WITH STENT PLACEMENT;  Surgeon: Hollice Espy, MD;  Location: [facility];  Service: Urology;  Laterality: Right;  ??  CYSTOSCOPY WITH STENT PLACEMENT Right 02/09/2019  ? Procedure: CYSTOSCOPY WITH STENT PLACEMENT;  Surgeon: Abbie Sons, MD;  Location: [facility];  Service: Urology;  Laterality: Right;  ?? CYSTOSCOPY/URETEROSCOPY/HOLMIUM LASER/STENT PLACEMENT Right 12/26/2017  ? Procedure: CYSTOSCOPY/URETEROSCOPY/HOLMIUM LASER/STENT PLACEMENT;  Surgeon: Abbie Sons, MD;  Location: [facility];  Service: Urology;  Laterality: Right;  ?? EXTRACORPOREAL SHOCK WAVE LITHOTRIPSY Right 12/04/2017  ? Procedure: EXTRACORPOREAL SHOCK WAVE LITHOTRIPSY (ESWL);  Surgeon: Hollice Espy,  MD;  Location: [facility];  Service: Urology;  Laterality: Right;  ?? PROSTATECTOMY    ?? URETEROSCOPY Right 07/29/2016  ? Procedure: URETEROSCOPY;  Surgeon: Hollice Espy, MD;  Location: [facility];  Service: Urology;  Laterality: Right;  ?? URETEROSCOPY Right 02/09/2019  ? Procedure: URETEROSCOPY- DIAGNOSTIC;  Surgeon: Abbie Sons, MD;  Location: [facility];  Service: Urology;  Laterality: Right;  ?? VIDEO BRONCHOSCOPY WITH ENDOBRONCHIAL NAVIGATION Left 03/15/2022  ? Procedure: VIDEO BRONCHOSCOPY WITH ENDOBRONCHIAL NAVIGATION;  Surgeon: Ottie Glazier, MD;  Location: [facility];  Service: Thoracic;  Laterality: Left;  ? ? ? ?FAMILY HISTORY  ? ?Family History  ?Problem Relation Age of Onset  ?? Stroke Mother   ?? Heart attack Father   ?? Lung cancer Father   ?? Kidney disease Father   ?? Prostate cancer Neg Hx   ?? Bladder Cancer Neg Hx   ? ? ? ?SOCIAL HISTORY  ? ?Social History  ? ?Tobacco Use  ?? Smoking status: Some Days  ?  Packs/day: 0.50  ?  Types: Cigarettes  ?? Smokeless tobacco: Never  ?Vaping Use  ?? Vaping Use: Never used  ?Substance Use Topics  ?? Alcohol use: Yes  ?  Alcohol/week: 14.0 standard drinks  ?  Types: 14 Cans of beer per week  ?? Drug use: Yes  ?  Types: Marijuana  ?  Comment: "sometimes, not very often"  ? ? ? ?MEDICATIONS  ? ? ?Home Medication:  ?Current Outpatient Rx  ?? Order #: [id number] Class: No Print  ?? Order #: [id number] Class: No Print  ?? Order #: [id number] Class: No Print  ?? Order #: [id number] Class: No Print  ? ?  ?Current Medication: ? ?Current Facility-Administered Medications:  ??  acetaminophen (TYLENOL) tablet 650 mg, 650 mg, Oral, Q6H PRN, 650 mg at 03/16/22 0548 **OR** acetaminophen (TYLENOL) suppository 650 mg, 650 mg, Rectal, Q6H PRN, Mansy, Jan A, MD ??  albuterol (PROVENTIL) (2.5 MG/3ML) 0.083% nebulizer solution 3 mL, 3 mL, Inhalation, Q4H PRN, Nicole Kindred A, DO, 3 mL at 03/16/22 0559 ??  atorvastatin (LIPITOR) tablet 80 mg, 80 mg, Oral, Daily, Mansy, Jan A, MD, 80 mg  at 03/15/22 1202 ??  azithromycin (ZITHROMAX) tablet 250 mg, 250 mg, Oral, Daily, Nicole Kindred A, DO, 250 mg at 03/15/22 1202 ??  benzonatate (TESSALON) capsule 200 mg, 200 mg, Oral, TID, Mansy, Jan A, MD, 200 mg at 03/15/22 2115 ??  bisacodyl (DULCOLAX) EC tablet 5 mg, 5 mg, Oral, Daily PRN, Nicole Kindred A, DO, 5 mg at 03/14/22 1001 ??  chlorhexidine (PERIDEX) 0.12 % solution 15 mL, 15 mL, Mouth Rinse, BID, Wouk, Ailene Rud, MD ??  Chlorhexidine Gluconate Cloth 2 % PADS 6 each, 6 each, Topical, Q0600, Gwynne Edinger, MD, 6 each at 03/16/22 0439 ??  Chlorhexidine Gluconate Cloth 2 % PADS 6 each, 6 each, Topical, Q0600, Gwynne Edinger, MD, 6 each at 03/16/22 0837 ??  [COMPLETED] heparin bolus via infusion 6,000 Units, 6,000 Units, Intravenous, Once, 6,000 Units at 03/16/22 0829 **FOLLOWED BY** heparin ADULT infusion 100 units/mL (  25000 units/254mL), 1,600 Units/hr, Intravenous, Continuous, Dorothe Pea, RPH, Last Rate: 16 mL/hr at 03/16/22 0831, 1,600 Units/hr at 03/16/22 0831 ??  ipratropium-albuterol (DUONEB) 0.5-2.5 (3) MG/3ML nebulizer solution 3 mL, 3 mL, Nebulization, Q6H PRN, Ottie Glazier, MD, 3 mL at 03/16/22 0209 ??  loratadine (CLARITIN) tablet 10 mg, 10 mg, Oral, Daily, 10 mg at 03/15/22 1203 **AND** [DISCONTINUED] pseudoephedrine (SUDAFED) 12 hr tablet 120 mg, 120 mg, Oral, BID, Mansy, Jan A, MD, 120 mg at 03/13/22 0933 ??  MEDLINE mouth rinse, 15 mL, Mouth Rinse, q12n4p, Wouk, Ailene Rud, MD ??  mupirocin ointment (BACTROBAN) 2 % 1 application., 1 application., Nasal, BID, Wouk, Ailene Rud, MD, 1 application. at 03/15/22 2115 ??  polyethylene glycol (MIRALAX / GLYCOLAX) packet 17 g, 17 g, Oral, Daily, Nicole Kindred A, DO, 17 g at 03/15/22 1201 ??  predniSONE (DELTASONE) tablet 20 mg, 20 mg, Oral, Q breakfast, Wouk, Ailene Rud, MD ??  senna-docusate (Senokot-S) tablet 1 tablet, 1 tablet, Oral, BID, Ezekiel Slocumb, DO, 1 tablet at 03/15/22 2115 ??  sodium phosphate (FLEET)  7-19 GM/118ML enema 1 enema, 1 enema, Rectal, Daily PRN, Si Raider, Ailene Rud, MD, 1 enema at 03/16/22 0439 ??  traZODone (DESYREL) tablet 200 mg, 200 mg, Oral, QHS PRN, Wouk, Ailene Rud, MD, 200 mg at 0

## 2022-03-16 NOTE — Progress Notes (Signed)
?Progress Note ? ? ?Patient: Edwin Donovan. ZOX:[id number] DOB: 01-26-1946 DOA: 03/09/2022     7 ?DOS: the patient was seen and examined on 03/16/2022 ?  ?Brief hospital course: ?Edwin Donovan. is a 76 y.o. male with past medical history COPD not on home oxygen prior to recent hospital d/c on 06/28/90, chronic diastolic CHF, hypertension and CKD stage II with hx or Right nephrectomy.  He presented to the ED early AM on 03/09/2022 with progressively worsening lower extremity edema, significant orthopnea, dyspnea worsening on exertion over the last couple weeks, in addition to worsening cough with wheezing.   ?  ?ED Course -- RR 22, spO2 95% on 3 L/min [place] O2, otherwise stable vitals.  Labs showed AKI, mild anemia, mild thrombocytopenia, minimally elevated hs-troponin which trended flat. ?  ?Imaging -- ?Chest x-ray - large masslike opacity in the left hilum. ?Chest CTA  - two isolated subsegmental pulmonary emboli at the bifurcation in the right upper lobe pulmonary artery. Overall clot burden is very small.    ?17 mm spiculated nodule in the posterior right upper lobe.  Associated 10.0 x 7.4 cm left mediastinal mass extending into the medial left upper lobe and left perihilar region. This appearance raises concern for primary bronchogenic neoplasm such as small cell lung cancer. ?  ?Admitted to hospitalist service with pulmonology, oncology and palliative care consulted.  ? ? ?Assessment and Plan: ? ?Acute respiratory failure ?This morning more somnolent with increasing O2 requirement. ABG shows hypercarbia. Started on hfnc and then bipap. Cxr relatively clear. Borderline hypotensive with map of 67. Alert and reports feeling improved with bipap. When I asked about intubation and resuscitation including cpr and defibrillation he said he would not want either. We discussed this at length with RN in the room and he was clear he would not want either so code status updated. Transferring to step-down for  further monitoring. 500 cc bolus running. Labs pending. Mucous plugging may contribute. Yesterday's anesthesia also a possibility. Uop recorded as only 300 yesterday will need to monitor that closely today ? ?Acute pulmonary embolism ([place]) ?Started on heparin, then eliquis, then held for bronch, pulm thinks safe to resume today, will start heparin. PVL neg for DVT. ? ?COPD with acute exacerbation ([place]) ?Acute hypoxic respiratory failure ?Presented with wheezing and increased O2 requirement.  Started on IV steroids on admission, transitioned to prednisone. ?--Continue prednisone, continue tapering, will reduce to 20 ?--5 days Zithromax 250 mg ordered ?--Duonebs and PRN albuterol nebs ?--Antitussives, mucolytics ?--Pulmonary hygiene ?--Supplement O2 PRN per protocol ? ?Acute on chronic diastolic CHF (congestive heart failure) ([place]) ?Echo this admission on 4/22 technically limited study with preserved EF, but diastolic parameters indeterminate. ?Started on IV Lasix BID on admission. ?Edema improved ?- lasix on hold now 2/2 hypotension ?--Toprol-XL 100 mg -- prescribe at d/c if bp improves, currently bb on hold ?--Strict I/O's, daily weights ? ?Nodule of upper lobe of right lung ?17 mm spiculated nodule in the posterior right upper lobe. ?Associated 10.0 x 7.4 cm left mediastinal mass extending into the ?medial left upper lobe and left perihilar region. This appearance ?raises concern for primary bronchogenic neoplasm such as small cell ?lung cancer. Mri no signs mets. Bronch with biopsies 4/28 ?-Pulmonary, Oncology, Palliative Care are consulted ?- outpt f/u dr. Janese Banks of oncology. PET ordered ? ?Obesity, Class III, BMI 40-49.9 (morbid obesity) ([place]) ?Body mass index is 40.53 kg/m?Marland Kitchen ?Complicates overall care and prognosis.  Recommend lifestyle modifications including physical activity and  diet for weight loss and overall long-term health. ? ?Lupus erythematosus ?Noted in chart review of [name]. ?No apparent acute  issues. ? ?Prostate cancer ([place]) ?Noted.  Monitor for obstructive urinary symptoms.   ? ?History of renal carcinoma ?S/p right nephrectomy, now solitary left kidney.  Monitor renal function and urine output closely. ? ?AKI (acute kidney injury) ([place]) ?Vs progression of ckd. Cr was 1 in 2020, here has held steady around 1.5 ?Monitor BMP. ?Avoid nephrotoxins, hypotension ?Maintain MAP>65 ?Renally dose meds ? ?Dyslipidemia ?Continue statin  ? ?Essential hypertension ?Using Coreg in place of Toprol-XL (while ACEI held due to AKI, BP's elevated).   ?Zestril held due to AKI.   ?Resume Zestril with renal function improves. ?Adjust antihypertensives PRN. ? ?Constipation ?Requests mag hydroxide addition to regimen, have started that ? ? ?Subjective: on bipap, reports breathing somewhat improved ? ?Physical Exam: ?Vitals:  ? 03/16/22 0649 03/16/22 0650 03/16/22 0730 03/16/22 0732  ?BP: (!) 76/52  (!) 86/58   ?Pulse: 99  91 91  ?Resp:   14 16  ?Temp:   97.6 ?F (36.4 ?C)   ?TempSrc:      ?SpO2: 97% 94% 91% 91%  ?Weight:      ?Height:      ? ?General exam: awake, alert, on bipap ?HEENT: moist mucus membranes, hearing grossly normal  ?Respiratory system: scattered rhonchi, tachypnic ?Cardiovascular system: normal S1/S2, RRR, resolved lower extremity edema.  ?Central nervous system: A&O x 4. no gross focal neurologic deficits, normal speech ?Extremities: trace edema ?Skin: dry, intact, normal temperature ?Psychiatry: normal mood, congruent affect, judgement and insight appear normal ? ? ?Family Communication: sig other gwendolyn updated telephonically 4/29. ? ?Disposition: ?Status is: Inpatient ?Remains inpatient appropriate because: monitoring post-procedure ? ? Planned Discharge Destination: Skilled nursing facility ? ?Time spent: 30 minutes ? ?Author: ?Desma Maxim, MD ?03/16/2022 7:57 AM ? ?For on call review [email or web address].  ?

## 2022-03-16 NOTE — Consult Note (Addendum)
ANTICOAGULATION CONSULT NOTE - Initial Consult ? ?Pharmacy Consult for heparin infusion ?Indication: pulmonary embolus ? ?No Known Allergies ? ?Patient Measurements: ?Height: 5\' 11"  (180.3 cm) ?Weight: 132 kg (291 lb 0.1 oz) ?IBW/kg (Calculated) : 75.3 ?Heparin Dosing Weight: 108.3 kg ? ?Vital Signs: ?Temp: 98.2 ?F (36.8 ?C) (04/29 1800) ?Temp Source: Oral (04/29 1800) ?BP: 128/74 (04/29 1800) ?Pulse Rate: 85 (04/29 1845) ? ?Labs: ?Recent Labs  ?  03/14/22 ?0342 03/15/22 ?0610 03/16/22 ?0754 03/16/22 ?0755  ?HGB  --   --  12.1*  --   ?HCT  --   --  39.5  --   ?PLT  --   --  153  --   ?CREATININE 1.59* 1.79* 2.69*  --   ?TROPONINIHS  --   --   --  95*  ? ? ? ?Estimated Creatinine Clearance: 31.9 mL/min (A) (by C-G formula based on SCr of 2.69 mg/dL (H)). ? ? ?Medical History: ?Past Medical History:  ?Diagnosis Date  ? Anxiety   ? ptsd  ? Arthritis   ? Cancer [facility])   ? PROSTATE  ? CHF (congestive heart failure) ([place])   ? Chronic kidney disease   ? stones  ? COPD (chronic obstructive pulmonary disease) ([place])   ? Dysrhythmia   ? History of kidney stones   ? Hypertension   ? Kidney stone   ? Pneumonia   ? 06/27/16  ? Shortness of breath dyspnea   ? ? ?Medications:  ?Eliquis 10 mg BID -- last dose 4/26 AM  ? ?Assessment: ?Pt is 76 yo male presenting to ED 03/09/22 w/ SOB and leg edema found with "two isolated subsegmental pulmonary emboli at the bifurcation in the right upper lobe pulmonary artery." Initially started on IV heparin therapy then transition to Eliquis 03/12/22. Eliquis held following 3 doses for bronchoscopy. Plan to resume anticoagulation therapy 03/16/22; however, patient NPO ISO BI-PAP. Pharmacy consulted to transition back to to IV heparin therapy  ? ? ?Goal of Therapy:  ?Heparin level 0.3-0.7 units/ml ?aPTT 66-102 seconds ?Monitor platelets by anticoagulation protocol: Yes ?  ?Plan:  ?aPTT >160.  ?Hold heparin drip for one hour then restart heparin drip at 1150 units/hr ?Check aPTT level in 8 hours and  anti-Xa level daily while on heparin ?Continue to monitor H&H and platelets ? ?Thank you for allowing pharmacy to be a part of this pleasant patient?s care. ? ?Darrick Penna, PharmD, MS PGPM ?Clinical Pharmacist ?03/16/2022 ?7:27 PM ? ? ? ?

## 2022-03-16 NOTE — Consult Note (Addendum)
ANTICOAGULATION CONSULT NOTE - Initial Consult ? ?Pharmacy Consult for heparin infusion ?Indication: pulmonary embolus ? ?No Known Allergies ? ?Patient Measurements: ?Height: 5\' 11"  (180.3 cm) ?Weight: 132 kg (291 lb 0.1 oz) ?IBW/kg (Calculated) : 75.3 ?Heparin Dosing Weight: 108.3 kg ? ?Vital Signs: ?Temp: 97.9 ?F (36.6 ?C) (04/29 0815) ?Temp Source: Axillary (04/29 0815) ?BP: 109/77 (04/29 0815) ?Pulse Rate: 92 (04/29 0815) ? ?Labs: ?Recent Labs  ?  03/14/22 ?0342 03/15/22 ?0610 03/16/22 ?6384  ?HGB  --   --  12.1*  ?HCT  --   --  39.5  ?PLT  --   --  153  ?CREATININE 1.59* 1.79* 2.69*  ? ? ?Estimated Creatinine Clearance: 31.9 mL/min (A) (by C-G formula based on SCr of 2.69 mg/dL (H)). ? ? ?Medical History: ?Past Medical History:  ?Diagnosis Date  ? Anxiety   ? ptsd  ? Arthritis   ? Cancer [facility])   ? PROSTATE  ? CHF (congestive heart failure) ([place])   ? Chronic kidney disease   ? stones  ? COPD (chronic obstructive pulmonary disease) ([place])   ? Dysrhythmia   ? History of kidney stones   ? Hypertension   ? Kidney stone   ? Pneumonia   ? 06/27/16  ? Shortness of breath dyspnea   ? ? ?Medications:  ?Eliquis 10 mg BID -- last dose 4/26 AM  ? ?Assessment: ?Pt is 76 yo male presenting to ED 03/09/22 w/ SOB and leg edema found with "two isolated subsegmental pulmonary emboli at the bifurcation in the right upper lobe pulmonary artery." Initially started on IV heparin therapy then transition to Eliquis 03/12/22. Eliquis held following 3 doses for bronchoscopy. Plan to resume anticoagulation therapy 03/16/22; however, patient NPO ISO BI-PAP. Pharmacy consulted to transition back to to IV heparin therapy  ? ? ?Goal of Therapy:  ?Heparin level 0.3-0.7 units/ml ?aPTT 66-102 seconds ?Monitor platelets by anticoagulation protocol: Yes ?  ?Plan:  ?Give 6000 units bolus x 1 ?Start heparin infusion at 1600 units/hr (previously therapeutic rate) ?Check anti-Xa level in 8 hours and daily while on heparin ?Continue to monitor H&H and  platelets ? ?Dorothe Pea, PharmD, BCPS ?Clinical Pharmacist   ?03/16/2022,8:21 AM ? ? ?

## 2022-03-17 ENCOUNTER — Inpatient Hospital Stay: Payer: Medicare Other

## 2022-03-17 DIAGNOSIS — I2699 Other pulmonary embolism without acute cor pulmonale: Secondary | ICD-10-CM | POA: Diagnosis not present

## 2022-03-17 LAB — CBC
HCT: 38.8 % — ABNORMAL LOW (ref 39.0–52.0)
Hemoglobin: 11.9 g/dL — ABNORMAL LOW (ref 13.0–17.0)
MCH: 30.1 pg (ref 26.0–34.0)
MCHC: 30.7 g/dL (ref 30.0–36.0)
MCV: 98 fL (ref 80.0–100.0)
Platelets: 143 10*3/uL — ABNORMAL LOW (ref 150–400)
RBC: 3.96 MIL/uL — ABNORMAL LOW (ref 4.22–5.81)
RDW: 14.5 % (ref 11.5–15.5)
WBC: 9 10*3/uL (ref 4.0–10.5)
nRBC: 0 % (ref 0.0–0.2)

## 2022-03-17 LAB — BLOOD GAS, VENOUS
Acid-Base Excess: 6.7 mmol/L — ABNORMAL HIGH (ref 0.0–2.0)
Bicarbonate: 36.1 mmol/L — ABNORMAL HIGH (ref 20.0–28.0)
O2 Saturation: 42.1 %
Patient temperature: 37
pCO2, Ven: 75 mmHg (ref 44–60)
pH, Ven: 7.29 (ref 7.25–7.43)
pO2, Ven: <31 mmHg — CL (ref 32–45)

## 2022-03-17 LAB — HEPARIN LEVEL (UNFRACTIONATED): Heparin Unfractionated: 1.01 IU/mL — ABNORMAL HIGH (ref 0.30–0.70)

## 2022-03-17 LAB — URINALYSIS, ROUTINE W REFLEX MICROSCOPIC
Bilirubin Urine: NEGATIVE
Glucose, UA: NEGATIVE mg/dL
Hgb urine dipstick: NEGATIVE
Ketones, ur: NEGATIVE mg/dL
Leukocytes,Ua: NEGATIVE
Nitrite: NEGATIVE
Protein, ur: NEGATIVE mg/dL
Specific Gravity, Urine: 1.019 (ref 1.005–1.030)
pH: 5 (ref 5.0–8.0)

## 2022-03-17 LAB — BASIC METABOLIC PANEL
Anion gap: 7 (ref 5–15)
BUN: 82 mg/dL — ABNORMAL HIGH (ref 8–23)
CO2: 33 mmol/L — ABNORMAL HIGH (ref 22–32)
Calcium: 8.7 mg/dL — ABNORMAL LOW (ref 8.9–10.3)
Chloride: 99 mmol/L (ref 98–111)
Creatinine, Ser: 1.94 mg/dL — ABNORMAL HIGH (ref 0.61–1.24)
GFR, Estimated: 35 mL/min — ABNORMAL LOW (ref >60)
Glucose, Bld: 104 mg/dL — ABNORMAL HIGH (ref 70–99)
Potassium: 4.4 mmol/L (ref 3.5–5.1)
Sodium: 139 mmol/L (ref 135–145)

## 2022-03-17 LAB — APTT
aPTT: 61 seconds — ABNORMAL HIGH (ref 24–36)
aPTT: 159 seconds — ABNORMAL HIGH (ref 24–36)

## 2022-03-17 MED ORDER — HEPARIN BOLUS VIA INFUSION
1500.0000 [IU] | Freq: Once | INTRAVENOUS | Status: AC
  Administered 2022-03-17: 1500 [IU] via INTRAVENOUS
  Filled 2022-03-17: qty 1500

## 2022-03-17 MED ORDER — HEPARIN (PORCINE) 25000 UT/250ML-% IV SOLN
1200.0000 [IU]/h | INTRAVENOUS | Status: DC
  Administered 2022-03-17: 1200 [IU]/h via INTRAVENOUS
  Filled 2022-03-17: qty 250

## 2022-03-17 NOTE — Progress Notes (Signed)
Unable to wean off of bipap, attempted multiple times to place on o2 nasal cannula on 4-5 liters, but he would desaturate quickly. Tolerating bipap well.  ?

## 2022-03-17 NOTE — Consult Note (Signed)
ANTICOAGULATION CONSULT NOTE  ? ?Pharmacy Consult for heparin infusion ?Indication: pulmonary embolus ? ?No Known Allergies ? ?Patient Measurements: ?Height: 5\' 11"  (180.3 cm) ?Weight: 131.1 kg (289 lb 0.4 oz) ?IBW/kg (Calculated) : 75.3 ?Heparin Dosing Weight: 108.3 kg ? ?Vital Signs: ?Temp: 98.3 ?F (36.8 ?C) (04/30 0000) ?Temp Source: Oral (04/30 0000) ?BP: 138/82 (04/30 0600) ?Pulse Rate: 85 (04/30 0600) ? ?Labs: ?Recent Labs  ?  03/15/22 ?0610 03/16/22 ?6599 03/16/22 ?0755 03/16/22 ?1705 03/17/22 ?0433  ?HGB  --  12.1*  --   --  11.9*  ?HCT  --  39.5  --   --  38.8*  ?PLT  --  153  --   --  143*  ?APTT  --   --   --  >160* 61*  ?HEPARINUNFRC  --   --   --  >1.10* 1.01*  ?CREATININE 1.79* 2.69*  --   --  1.94*  ?TROPONINIHS  --   --  95*  --   --   ? ? ? ?Estimated Creatinine Clearance: 44 mL/min (A) (by C-G formula based on SCr of 1.94 mg/dL (H)). ? ? ?Medical History: ?Past Medical History:  ?Diagnosis Date  ? Anxiety   ? ptsd  ? Arthritis   ? Cancer [facility])   ? PROSTATE  ? CHF (congestive heart failure) ([place])   ? Chronic kidney disease   ? stones  ? COPD (chronic obstructive pulmonary disease) ([place])   ? Dysrhythmia   ? History of kidney stones   ? Hypertension   ? Kidney stone   ? Pneumonia   ? 06/27/16  ? Shortness of breath dyspnea   ? ? ?Medications:  ?Eliquis 10 mg BID -- last dose 4/26 AM  ? ?Assessment: ?Pt is 76 yo male presenting to ED 03/09/22 w/ SOB and leg edema found with "two isolated subsegmental pulmonary emboli at the bifurcation in the right upper lobe pulmonary artery." Initially started on IV heparin therapy then transition to Eliquis 03/12/22. Eliquis held following 3 doses for bronchoscopy. Plan to resume anticoagulation therapy 03/16/22; however, patient NPO ISO BI-PAP. Pharmacy consulted to transition back to to IV heparin therapy  ? ?4/30 0433 aPTT=61, HL=1.01 ? ? ?Goal of Therapy:  ?Heparin level 0.3-0.7 units/ml ?aPTT 66-102 seconds ?Monitor platelets by anticoagulation protocol: Yes ?   ?Plan:  ?aPTT 61, subtherapeutic ?Will order bolus of 1500 units x 1 and increase drip to 1350 units/hr ?reCheck aPTT level in 8 hours and anti-Xa level daily while on heparin until correlating ?Continue to monitor H&H and platelets ? ?Thank you for allowing pharmacy to be a part of this pleasant patient?s care. ? ?Noralee Space, PharmD ?Clinical Pharmacist ?03/17/2022 ?7:35 AM ? ? ? ?

## 2022-03-17 NOTE — Evaluation (Signed)
Clinical/Bedside Swallow Evaluation ?Patient Details  ?Name: Edwin Donovan. ?MRN: [id number] ?Date of Birth: October 08, 1946 ? ?Today's Date: 03/17/2022 ?Time: SLP Start Time (ACUTE ONLY): 1157 SLP Stop Time (ACUTE ONLY): 2620 ?SLP Time Calculation (min) (ACUTE ONLY): 17 min ? ?Past Medical History:  ?Past Medical History:  ?Diagnosis Date  ? Anxiety   ? ptsd  ? Arthritis   ? Cancer [facility])   ? PROSTATE  ? CHF (congestive heart failure) ([place])   ? Chronic kidney disease   ? stones  ? COPD (chronic obstructive pulmonary disease) ([place])   ? Dysrhythmia   ? History of kidney stones   ? Hypertension   ? Kidney stone   ? Pneumonia   ? 06/27/16  ? Shortness of breath dyspnea   ? ?Past Surgical History:  ?Past Surgical History:  ?Procedure Laterality Date  ? CYSTOSCOPY W/ RETROGRADES Bilateral 12/26/2017  ? Procedure: CYSTOSCOPY WITH RETROGRADE PYELOGRAM;  Surgeon: Abbie Sons, MD;  Location: [facility] OR[facility];  Service: Urology;  Laterality: Bilateral;  ? CYSTOSCOPY W/ RETROGRADES Right 02/09/2019  ? Procedure: CYSTOSCOPY WITH RETROGRADE PYELOGRAM;  Surgeon: Abbie Sons, MD;  Location: [facility];  Service: Urology;  Laterality: Right;  ? CYSTOSCOPY W/ URETERAL STENT PLACEMENT Right 07/29/2016  ? Procedure: CYSTOSCOPY WITH STENT REPLACEMENT;  Surgeon: Hollice Espy, MD;  Location: [facility];  Service: Urology;  Laterality: Right;  ? CYSTOSCOPY WITH BIOPSY Right 07/29/2016  ? Procedure: CYSTOSCOPY WITH BIOPSY;  Surgeon: Hollice Espy, MD;  Location: [facility];  Service: Urology;  Laterality: Right;  ? CYSTOSCOPY WITH BIOPSY Right 02/09/2019  ? Procedure: CYSTOSCOPY WITH BIOPSY;  Surgeon: Abbie Sons, MD;  Location: [facility];  Service: Urology;  Laterality: Right;  ? CYSTOSCOPY WITH STENT PLACEMENT Right 06/28/2016  ? Procedure: CYSTOSCOPY WITH STENT PLACEMENT;  Surgeon: Hollice Espy, MD;  Location: [facility];  Service: Urology;  Laterality: Right;  ? CYSTOSCOPY WITH STENT PLACEMENT Right 02/09/2019  ? Procedure: CYSTOSCOPY WITH  STENT PLACEMENT;  Surgeon: Abbie Sons, MD;  Location: [facility];  Service: Urology;  Laterality: Right;  ? CYSTOSCOPY/URETEROSCOPY/HOLMIUM LASER/STENT PLACEMENT Right 12/26/2017  ? Procedure: CYSTOSCOPY/URETEROSCOPY/HOLMIUM LASER/STENT PLACEMENT;  Surgeon: Abbie Sons, MD;  Location: [facility];  Service: Urology;  Laterality: Right;  ? EXTRACORPOREAL SHOCK WAVE LITHOTRIPSY Right 12/04/2017  ? Procedure: EXTRACORPOREAL SHOCK WAVE LITHOTRIPSY (ESWL);  Surgeon: Hollice Espy, MD;  Location: [facility];  Service: Urology;  Laterality: Right;  ? PROSTATECTOMY    ? URETEROSCOPY Right 07/29/2016  ? Procedure: URETEROSCOPY;  Surgeon: Hollice Espy, MD;  Location: [facility];  Service: Urology;  Laterality: Right;  ? URETEROSCOPY Right 02/09/2019  ? Procedure: URETEROSCOPY- DIAGNOSTIC;  Surgeon: Abbie Sons, MD;  Location: [facility];  Service: Urology;  Laterality: Right;  ? VIDEO BRONCHOSCOPY WITH ENDOBRONCHIAL NAVIGATION Left 03/15/2022  ? Procedure: VIDEO BRONCHOSCOPY WITH ENDOBRONCHIAL NAVIGATION;  Surgeon: Ottie Glazier, MD;  Location: [facility];  Service: Thoracic;  Laterality: Left;  ? ?HPI:  ?Per H&P, 03/09/22, "Edwin Donovan. is a 76 y.o. male with medical history significant for COPD, CHF, hypertension and CKD, who presented to the ER with acute onset of worsening lower extremity edema with associated orthopnea and dyspnea worsening on exertion over the last couple weeks.  He admitted to worsening cough as well as wheezing.  His symptoms have been getting significantly worse over the last couple of days.  No fever or chills.  No nausea or vomiting or abdominal pain.  He denies any chest pain or palpitations.  No dysuria, oliguria or hematuria or flank pain.  No bleeding diathesis.     ED Course: Upon presentation to the ER, respiratory it was 22 and pulse currently 95% on 3 L of O2 by nasal cannula with otherwise normal vital signs.  Labs revealed a BUN of 55 and creatinine 1.62 and high-sensitivity  troponin was 30 and later 32.  CBC showed mild anemia and thrombocytopenia.  Influenza antigens and COVID-19 PCR came back negative.  Coagulation profile was within normal.  EKG as reviewed by me : Sinus rhythm with a rate of 78 with low voltage QRS  Imaging: For which x-ray showed large masslike opacity in the left hilum.  Chest CTA revealed the following:  Two isolated subsegmental pulmonary emboli at the bifurcation in the  right upper lobe pulmonary artery. Overall clot burden is very  small.     17 mm spiculated nodule in the posterior right upper lobe.  Associated 10.0 x 7.4 cm left mediastinal mass extending into the  medial left upper lobe and left perihilar region. This appearance  raises concern for primary bronchogenic neoplasm such as small cell  lung cancer.     Critical Value/emergent results were called by telephone at the time  of interpretation on 03/09/2022 at 2:46 am to provider Dr Pryor Curia, who verbally acknowledged these results.     Aortic Atherosclerosis (ICD10-I70.0) and Emphysema (ICD10-J43.9).    The patient was given DuoNebs, IV Solu-Medrol and was started on IV heparin with bolus and drip.  He is admitted to a progressive unit bed for further evaluation and management."  ?  ?Assessment / Plan / Recommendation  ?Clinical Impression ? Pt seen for clinical swallowing evaluation. Pt alert, pleasant, and cooperative. Friend present. On 6L/min O2 via [place]. Cleared with RN. ? ?Oral motor examination significant for xerostomia and functional edentulism (pt has x1 remaining tooth). Weak, but functional, vocal quality noted. Baseline congested cough noted. ? ?Per chart review, temp and WBC WNL. CT chest, 03/14/22 "1. 11.0 x 7.6 x 10.0 cm infiltrative mass centered in the perihilar aspect of the left lung, predominantly in the left upper lobe, with evidence of direct mediastinal invasion, intimately associated with both the left main pulmonary artery, lateral aspect of the aortic ?arch and the left  atrial appendage with small amount of adjacent pericardial fluid/thickening, suggesting potential pericardial involvement. Findings are highly concerning for primary bronchogenicneoplasm. 2. Small aggressive appearing pulmonary nodule in the right upper lobe which could represent a metastatic lesion or separate primary bronchogenic neoplasm. 3. Atelectasis and consolidation in the left lower lobe with small partially loculated left pleural effusion. 4. Aortic atherosclerosis, in addition to left main and three-vessel coronary artery disease. Assessment for potential risk factor modification, dietary therapy or pharmacologic therapy may be warranted, if clinically indicated. 5. Diffuse bronchial wall thickening with mild to moderate centrilobular and paraseptal emphysema; imaging findings suggestive of underlying COPD." CXR, 03/16/22, "Improved left lung volume and ventilation since bronchoscopy yesterday. Underlying left lung mass and lung base atelectasis/pneumonia. No pneumothorax or new cardiopulmonary abnormality." Repeat CT chest pending. ? ?Pt presents with s/sx mild oral dysphagia c/b prolonged mastication of solids likely due to dental status. Pharyngeal swallow appeared [facility] per clinical assessment. To palpation, pt with seemingly adequate laryngeal elevation and seemingly timely swallow to palpation. No overt s/sx pharyngeal dysphagia. No change to vocal quality or respiratory status during evaluation. X1 very delayed, congested cough appreciated which appeared consistent with baseline cough.  ? ?Recommend diet dowgrade to  mech soft diet with thin liquids with safe swallowing strategies/aspiration precautions as outlined below.  ? ?Pt, friend, and RN made aware of results, recommendations, and SLP POC. Pt and friend educated re: relationship between breathing swallowing and rationale for diet rec's and safe swallowing/aspiration precautions (with emphasis on energy conservation given pt's respiratory  status).  ? ?Pt is at mildly increased risk for aspiration/aspiration PNA given dental status, respiratory status, and medical comorbities.  ? ?SLP to f/u per POC for diet tolerance and trials of upgraded text

## 2022-03-17 NOTE — Progress Notes (Signed)
?Progress Note ? ? ?Patient: Edwin Donovan. KGM:[id number] DOB: 11/05/1946 DOA: 03/09/2022     8 ?DOS: the patient was seen and examined on 03/17/2022 ?  ?Brief hospital course: ?Edwin "Rush [place]mark" Elieser Donovan. is a 76 y.o. male with past medical history COPD not on home oxygen prior to recent hospital d/c on 6/44/03, chronic diastolic CHF, hypertension and CKD stage II with hx or Right nephrectomy.  He presented to the ED early AM on 03/09/2022 with progressively worsening lower extremity edema, significant orthopnea, dyspnea worsening on exertion over the last couple weeks, in addition to worsening cough with wheezing.   ?  ?ED Course -- RR 22, spO2 95% on 3 L/min [place] O2, otherwise stable vitals.  Labs showed AKI, mild anemia, mild thrombocytopenia, minimally elevated hs-troponin which trended flat. ?  ?Imaging -- ?Chest x-ray - large masslike opacity in the left hilum. ?Chest CTA  - two isolated subsegmental pulmonary emboli at the bifurcation in the right upper lobe pulmonary artery. Overall clot burden is very small.    ?17 mm spiculated nodule in the posterior right upper lobe.  Associated 10.0 x 7.4 cm left mediastinal mass extending into the medial left upper lobe and left perihilar region. This appearance raises concern for primary bronchogenic neoplasm such as small cell lung cancer. ?  ?Admitted to hospitalist service with pulmonology, oncology and palliative care consulted.  ? ? ?Assessment and Plan: ? ?Acute respiratory failure ?AM on 4/29 somnolent, increased o2 requirement, hypercarbic. CXR stable. Started on bipap and moved to ICU. Stable off bipap that afternoon but more somnolent in the evening, placed back on bipap overnight. Has now been weaned off ?- will trial off bipap ?- cont  o2 ?- check vbg this pm ? ?Urinary retention ?Renal u/s with distended bladder, no hydro. Patient is able to void but pvr is elevated. Suspect chronic 2/2 bph. Was treated for prostate cancer, unclear how treated,  also urotherial carcinoma s/p right nephrectomy. ?- check ua ?- flomax started ?- will discuss w/ urology ? ?Acute pulmonary embolism ([place]) ?Started on heparin, then eliquis, then held for bronch, pulm thinks safe to resume, currently on heparin, will tranisition to oral med once stable medically. PVL neg for DVT. ? ?COPD with acute exacerbation ([place]) ?Acute hypoxic respiratory failure ?Presented with wheezing and increased O2 requirement.  Started on IV steroids on admission, transitioned to prednisone. ?--Continue prednisone, continue tapering, will reduce to 20 ?--5 days Zithromax 250 mg ordered ?--Duonebs and PRN albuterol nebs ?--Antitussives, mucolytics ?--Pulmonary hygiene ?--Supplement O2 PRN per protocol ? ?Acute on chronic diastolic CHF (congestive heart failure) ([place]) ?Echo this admission on 4/22 technically limited study with preserved EF, but diastolic parameters indeterminate. ?Started on IV Lasix BID on admission. ?Edema improved ?- lasix on hold now 2/2 hypotension ?--Toprol-XL 100 mg -- prescribe at d/c if bp improves, currently bb on hold ?--Strict I/O's, daily weights ? ?Nodule of upper lobe of right lung ?17 mm spiculated nodule in the posterior right upper lobe. ?Associated 10.0 x 7.4 cm left mediastinal mass extending into the ?medial left upper lobe and left perihilar region. This appearance ?raises concern for primary bronchogenic neoplasm such as small cell ?lung cancer. Mri no signs mets. Bronch with biopsies 4/28 ?-Pulmonary, Oncology, Palliative Care are consulted ?- outpt f/u dr. Janese Banks of oncology. PET ordered ? ?Obesity, Class III, BMI 40-49.9 (morbid obesity) ([place]) ?Body mass index is 40.53 kg/m?Marland Kitchen ?Complicates overall care and prognosis.  Recommend lifestyle modifications including physical activity and diet  for weight loss and overall long-term health. ? ?Lupus erythematosus ?Noted in chart review of [place] records. ?No apparent acute issues. ? ?History of renal carcinoma ?S/p right  nephrectomy, now solitary left kidney.  Monitor renal function and urine output closely. ? ?AKI (acute kidney injury) ([place]) ?Vs progression of ckd. Cr was 1 in 2020, here has held steady around 1.5, up to 2s yesterday in setting of hypotension. Improving today with IVF ?- cont ivf ?Avoid nephrotoxins, hypotension ?Maintain MAP>65 ?Renally dose meds ? ?Dyslipidemia ?Continue statin  ? ?Essential hypertension ?Using Coreg in place of Toprol-XL (while ACEI held due to AKI, BP's elevated).   ?Zestril held due to AKI.   ?Resume Zestril with renal function improves. ?Adjust antihypertensives PRN. ? ?Constipation ?Requests mag hydroxide addition to regimen, have started that ? ? ?Subjective: fatigued. Breathing improved from yesterday. No chest pain ? ?Physical Exam: ?Vitals:  ? 03/17/22 0732 03/17/22 0800 03/17/22 0900 03/17/22 1000  ?BP:  116/79 113/72 137/82  ?Pulse:  80 88 86  ?Resp:  15 16 15   ?Temp:   97.9 ?F (36.6 ?C)   ?TempSrc:   Axillary   ?SpO2: 95% 93% 94% 94%  ?Weight:      ?Height:      ? ?General exam: awake, alert, on bipap ?HEENT: moist mucus membranes, hearing grossly normal  ?Respiratory system: scattered rhonchi ?Cardiovascular system: normal S1/S2, RRR, resolved lower extremity edema.  ?Central nervous system: A&O x 4. no gross focal neurologic deficits, normal speech ?Extremities: trace edema ?Skin: dry, intact, cool fingers ?Psychiatry: calm, aaox3 ? ? ?Family Communication: sig other gwendolyn updated telephonically 4/29. ? ?Disposition: ?Status is: Inpatient ?Remains inpatient appropriate because: monitoring post-procedure ? ? Planned Discharge Destination: Skilled nursing facility ? ?Time spent: 30 minutes ? ?Author: ?Desma Maxim, MD ?03/17/2022 11:27 AM ? ?For on call review [email or web address].  ?

## 2022-03-17 NOTE — Progress Notes (Signed)
Attempted to transition patient from BiPAP to Nasal Cannula, patient refused.  Requested to remain on BiPAP for comfort.  Pt appears lethargic, but awakens to voice and is oriented x4.  Will continue to monitor. ?

## 2022-03-17 NOTE — Progress Notes (Signed)
SLP Cancellation Note ? ?Patient Details ?Name: Edwin Donovan. ?MRN: [id number] ?DOB: 15-Feb-1946 ? ? ?Cancelled treatment:       Reason Eval/Treat Not Completed: Patient not medically ready  ? ?SLP consult received and appreciated. Chart review completed. Per chart review and confirmation with RN, pt currently on BiPap. Pt unable to wean from BiPap this AM.  ? ?Will hold SLP efforts at this time given pt's respiratory status. RN aware and in agreement. RN to contact SLP if/when pt liberated from BiPap. ? ?SLP to continue efforts as appropriate.  ? ?Cherrie Gauze, M.S., CCC-SLP ?Speech-Language Pathologist ?[facility] ?[phone] ([place])  ? ?Quintella Baton ?03/17/2022, 8:17 AM ?

## 2022-03-17 NOTE — Progress Notes (Signed)
? ? ? ?PULMONOLOGY ? ? ? ? ? ? ? ? ?Date: 03/17/2022,   ?MRN# [id number] Burl Tauzin. Jul 05, 1946 ? ? ?  ?AdmissionWeight: (!) 141.5 kg                 ?CurrentWeight: 131.1 kg ? ?Referring provider: Dr. Arbutus Ped ? ? ?CHIEF COMPLAINT:  ? ?Nonmassive PE with RUL nodule and mediastinal lymphadenopathy ? ? ?HISTORY OF PRESENT ILLNESS  ? ?This is a pleasant 76 year old male with a history of prostate CA, disorder, arthritis, CHF, CKD COPD, dysrhythmia history of nephrolithiasis and essential previous episode of pneumonia in 2017, he reports worsening cough and wheezing.  He was found to be mildly tachypneic on arrival and required 3 L of supplemental oxygen to reach normoxia at SPO2 95%.  Labs showed CKD however troponins were essentially normal in the context of CKD with only mild elevation.  Influenza and COVID-19 are negative.  CTA was performed with findings of subsegmental pulmonary emboli as well as 17 mm spiculated nodule of the right upper lobe and associated mediastinal lymphadenopathy/mass.  He had nephrectomy in the past due to renal cell carcinoma.  He quit smoking 2 years ago. PCCM consultation was placed for additional evaluation and management.  ? ? ?03/17/22- patient is improved, he is off BIPAP. He is speaking and answering appropriately to verbal communication. Have dcd IVF and tessalon.  Have ordered recruitment maneuvers with metaneb. CT chest today to eval lung as suggested by previous radiology report.  ? ? ? ?PAST MEDICAL HISTORY  ? ?Past Medical History:  ?Diagnosis Date  ?? Anxiety   ? ptsd  ?? Arthritis   ?? Cancer [facility])   ? PROSTATE  ?? CHF (congestive heart failure) ([place])   ?? Chronic kidney disease   ? stones  ?? COPD (chronic obstructive pulmonary disease) ([place])   ?? Dysrhythmia   ?? History of kidney stones   ?? Hypertension   ?? Kidney stone   ?? Pneumonia   ? 06/27/16  ?? Shortness of breath dyspnea   ? ? ? ?SURGICAL HISTORY  ? ?Past Surgical History:  ?Procedure Laterality Date  ??  CYSTOSCOPY W/ RETROGRADES Bilateral 12/26/2017  ? Procedure: CYSTOSCOPY WITH RETROGRADE PYELOGRAM;  Surgeon: Abbie Sons, MD;  Location: [facility];  Service: Urology;  Laterality: Bilateral;  ?? CYSTOSCOPY W/ RETROGRADES Right 02/09/2019  ? Procedure: CYSTOSCOPY WITH RETROGRADE PYELOGRAM;  Surgeon: Abbie Sons, MD;  Location: [facility];  Service: Urology;  Laterality: Right;  ?? CYSTOSCOPY W/ URETERAL STENT PLACEMENT Right 07/29/2016  ? Procedure: CYSTOSCOPY WITH STENT REPLACEMENT;  Surgeon: Hollice Espy, MD;  Location: [facility];  Service: Urology;  Laterality: Right;  ?? CYSTOSCOPY WITH BIOPSY Right 07/29/2016  ? Procedure: CYSTOSCOPY WITH BIOPSY;  Surgeon: Hollice Espy, MD;  Location: [facility];  Service: Urology;  Laterality: Right;  ?? CYSTOSCOPY WITH BIOPSY Right 02/09/2019  ? Procedure: CYSTOSCOPY WITH BIOPSY;  Surgeon: Abbie Sons, MD;  Location: [facility];  Service: Urology;  Laterality: Right;  ?? CYSTOSCOPY WITH STENT PLACEMENT Right 06/28/2016  ? Procedure: CYSTOSCOPY WITH STENT PLACEMENT;  Surgeon: Hollice Espy, MD;  Location: [facility];  Service: Urology;  Laterality: Right;  ?? CYSTOSCOPY WITH STENT PLACEMENT Right 02/09/2019  ? Procedure: CYSTOSCOPY WITH STENT PLACEMENT;  Surgeon: Abbie Sons, MD;  Location: [facility];  Service: Urology;  Laterality: Right;  ?? CYSTOSCOPY/URETEROSCOPY/HOLMIUM LASER/STENT PLACEMENT Right 12/26/2017  ? Procedure: CYSTOSCOPY/URETEROSCOPY/HOLMIUM LASER/STENT PLACEMENT;  Surgeon: Abbie Sons, MD;  Location: [facility];  Service: Urology;  Laterality: Right;  ?? EXTRACORPOREAL SHOCK WAVE LITHOTRIPSY Right 12/04/2017  ? Procedure: EXTRACORPOREAL SHOCK WAVE LITHOTRIPSY (ESWL);  Surgeon: Hollice Espy, MD;  Location: [facility];  Service: Urology;  Laterality: Right;  ?? PROSTATECTOMY    ?? URETEROSCOPY Right 07/29/2016  ? Procedure: URETEROSCOPY;  Surgeon: Hollice Espy, MD;  Location: [facility];  Service: Urology;  Laterality: Right;  ?? URETEROSCOPY Right  02/09/2019  ? Procedure: URETEROSCOPY- DIAGNOSTIC;  Surgeon: Abbie Sons, MD;  Location: [facility];  Service: Urology;  Laterality: Right;  ?? VIDEO BRONCHOSCOPY WITH ENDOBRONCHIAL NAVIGATION Left 03/15/2022  ? Procedure: VIDEO BRONCHOSCOPY WITH ENDOBRONCHIAL NAVIGATION;  Surgeon: Ottie Glazier, MD;  Location: [facility];  Service: Thoracic;  Laterality: Left;  ? ? ? ?FAMILY HISTORY  ? ?Family History  ?Problem Relation Age of Onset  ?? Stroke Mother   ?? Heart attack Father   ?? Lung cancer Father   ?? Kidney disease Father   ?? Prostate cancer Neg Hx   ?? Bladder Cancer Neg Hx   ? ? ? ?SOCIAL HISTORY  ? ?Social History  ? ?Tobacco Use  ?? Smoking status: Some Days  ?  Packs/day: 0.50  ?  Types: Cigarettes  ?? Smokeless tobacco: Never  ?Vaping Use  ?? Vaping Use: Never used  ?Substance Use Topics  ?? Alcohol use: Yes  ?  Alcohol/week: 14.0 standard drinks  ?  Types: 14 Cans of beer per week  ?? Drug use: Yes  ?  Types: Marijuana  ?  Comment: "sometimes, not very often"  ? ? ? ?MEDICATIONS  ? ? ?Home Medication:  ?Current Outpatient Rx  ?? Order #: [id number] Class: No Print  ?? Order #: [id number] Class: No Print  ?? Order #: [id number] Class: No Print  ?? Order #: [id number] Class: No Print  ? ?  ?Current Medication: ? ?Current Facility-Administered Medications:  ??  acetaminophen (TYLENOL) tablet 650 mg, 650 mg, Oral, Q6H PRN, 650 mg at 03/16/22 0548 **OR** acetaminophen (TYLENOL) suppository 650 mg, 650 mg, Rectal, Q6H PRN, Mansy, Jan A, MD ??  albuterol (PROVENTIL) (2.5 MG/3ML) 0.083% nebulizer solution 3 mL, 3 mL, Inhalation, Q4H PRN, Nicole Kindred A, DO, 3 mL at 03/16/22 0559 ??  atorvastatin (LIPITOR) tablet 80 mg, 80 mg, Oral, Daily, Mansy, Jan A, MD, 80 mg at 03/15/22 1202 ??  bisacodyl (DULCOLAX) EC tablet 5 mg, 5 mg, Oral, Daily PRN, Nicole Kindred A, DO, 5 mg at 03/14/22 1001 ??  chlorhexidine (PERIDEX) 0.12 % solution 15 mL, 15 mL, Mouth Rinse, BID, Wouk, Ailene Rud, MD ??  Chlorhexidine Gluconate Cloth  2 % PADS 6 each, 6 each, Topical, Q0600, Gwynne Edinger, MD, 6 each at 03/17/22 0520 ??  [COMPLETED] heparin bolus via infusion 6,000 Units, 6,000 Units, Intravenous, Once, 6,000 Units at 03/16/22 0829 **FOLLOWED BY** heparin ADULT infusion 100 units/mL (25000 units/237mL), 1,350 Units/hr, Intravenous, Continuous, Noralee Space, RPH, Last Rate: 13.5 mL/hr at 03/17/22 0744, 1,350 Units/hr at 03/17/22 0744 ??  ipratropium-albuterol (DUONEB) 0.5-2.5 (3) MG/3ML nebulizer solution 3 mL, 3 mL, Nebulization, Q6H PRN, Ottie Glazier, MD, 3 mL at 03/16/22 1542 ??  MEDLINE mouth rinse, 15 mL, Mouth Rinse, q12n4p, Wouk, Ailene Rud, MD, 15 mL at 03/17/22 1144 ??  mupirocin ointment (BACTROBAN) 2 % 1 application., 1 application., Nasal, BID, Wouk, Ailene Rud, MD, 1 application. at 03/15/22 2115 ??  polyethylene glycol (MIRALAX / GLYCOLAX) packet 17 g, 17 g, Oral, Daily, Nicole Kindred A, DO, 17 g at 03/15/22 1201 ??  predniSONE (DELTASONE) tablet 20  mg, 20 mg, Oral, Q breakfast, Wouk, Ailene Rud, MD ??  senna-docusate (Senokot-S) tablet 1 tablet, 1 tablet, Oral, BID, Ezekiel Slocumb, DO, 1 tablet at 03/15/22 2115 ??  sodium phosphate (FLEET) 7-19 GM/118ML enema 1 enema, 1 enema, Rectal, Daily PRN, Si Raider, Ailene Rud, MD, 1 enema at 03/16/22 0439 ??  tamsulosin (FLOMAX) capsule 0.4 mg, 0.4 mg, Oral, QPC supper, Wouk, Ailene Rud, MD, 0.4 mg at 03/16/22 1955 ??  traZODone (DESYREL) tablet 200 mg, 200 mg, Oral, QHS PRN, Si Raider, Ailene Rud, MD, 200 mg at 03/15/22 2115 ? ? ? ?ALLERGIES  ? ?Patient has no known allergies. ? ? ? ? ?REVIEW OF SYSTEMS  ? ? ?Review of Systems: ? ?Gen:  Denies  fever, sweats, chills weigh loss  ?HEENT: Denies blurred vision, double vision, ear pain, eye pain, hearing loss, nose bleeds, sore throat ?Cardiac:  No dizziness, chest pain or heaviness, chest tightness,edema ?Resp:   reports dyspnea chronically  ?Gi: Denies swallowing difficulty, stomach pain, nausea or vomiting, diarrhea,  constipation, bowel incontinence ?Gu:  Denies bladder incontinence, burning urine ?Ext:   Denies Joint pain, stiffness or swelling ?Skin: Denies  skin rash, easy bruising or bleeding or hives ?Endoc:  Denies

## 2022-03-17 NOTE — Progress Notes (Signed)
?   03/17/22 1400  ?Clinical Encounter Type  ?Visited With Patient and family together  ?Visit Type Follow-up  ?Spiritual Encounters  ?Spiritual Needs Prayer  ? ?Chaplain noticed patient moved from regular to ICU and provided follow up care. ?

## 2022-03-17 NOTE — Progress Notes (Signed)
?   03/17/22 1930  ?Clinical Encounter Type  ?Visited With Patient  ?Visit Type Follow-up  ?Spiritual Encounters  ?Spiritual Needs Prayer  ? ?Chaplain provided reflective listening and meaningful conversation for support ?

## 2022-03-17 NOTE — Consult Note (Signed)
ANTICOAGULATION CONSULT NOTE  ? ?Pharmacy Consult for heparin infusion ?Indication: pulmonary embolus ? ?No Known Allergies ? ?Patient Measurements: ?Height: 5\' 11"  (180.3 cm) ?Weight: 131.1 kg (289 lb 0.4 oz) ?IBW/kg (Calculated) : 75.3 ?Heparin Dosing Weight: 108.3 kg ? ?Vital Signs: ?Temp: 97.9 ?F (36.6 ?C) (04/30 0900) ?Temp Source: Axillary (04/30 0900) ?BP: 136/74 (04/30 1600) ?Pulse Rate: 88 (04/30 1645) ? ?Labs: ?Recent Labs  ?  03/15/22 ?0610 03/16/22 ?1941 03/16/22 ?0755 03/16/22 ?1705 03/17/22 ?0433 03/17/22 ?1608  ?HGB  --  12.1*  --   --  11.9*  --   ?HCT  --  39.5  --   --  38.8*  --   ?PLT  --  153  --   --  143*  --   ?APTT  --   --   --  >160* 61* 159*  ?HEPARINUNFRC  --   --   --  >1.10* 1.01*  --   ?CREATININE 1.79* 2.69*  --   --  1.94*  --   ?TROPONINIHS  --   --  95*  --   --   --   ? ? ? ?Estimated Creatinine Clearance: 44 mL/min (A) (by C-G formula based on SCr of 1.94 mg/dL (H)). ? ? ?Medical History: ?Past Medical History:  ?Diagnosis Date  ? Anxiety   ? ptsd  ? Arthritis   ? Cancer [facility])   ? PROSTATE  ? CHF (congestive heart failure) ([place])   ? Chronic kidney disease   ? stones  ? COPD (chronic obstructive pulmonary disease) ([place])   ? Dysrhythmia   ? History of kidney stones   ? Hypertension   ? Kidney stone   ? Pneumonia   ? 06/27/16  ? Shortness of breath dyspnea   ? ? ?Medications:  ?Eliquis 10 mg BID -- last dose 4/26 AM  ? ?Assessment: ?Pt is 76 yo male presenting to ED 03/09/22 w/ SOB and leg edema found with "two isolated subsegmental pulmonary emboli at the bifurcation in the right upper lobe pulmonary artery." Initially started on IV heparin therapy then transition to Eliquis 03/12/22. Eliquis held following 3 doses for bronchoscopy. Plan to resume anticoagulation therapy 03/16/22; however, patient NPO ISO BI-PAP. Pharmacy consulted to transition back to to IV heparin therapy  ? ?Date Time aPTT/HL  Rate/comment ?4/29 1705 aPTT >160  1600 un/hr >> 1150 un/hr ?4/30  0433  aPTT=61,  HL=1.01 1150 un/hr >> 1350 un/hr ?4/30  1608 aPTT 159  1350 un/hr >> 1200 un/hr ? ? ?Goal of Therapy:  ?Heparin level 0.3-0.7 units/ml ?aPTT 66-102 seconds ?Monitor platelets by anticoagulation protocol: Yes ?  ?Plan:  ?aPTT 159, supratherapeutic ?Hold heparin x 1 hour and restart at reduced rate of 1200 units/hr ?reCheck aPTT level in 8 hours and anti-Xa level daily while on heparin until correlating ?Continue to monitor H&H and platelets ? ? ?Darnelle Bos, PharmD ?Clinical Pharmacist ?03/17/2022 ?5:20 PM ? ? ? ?

## 2022-03-18 ENCOUNTER — Other Ambulatory Visit (HOSPITAL_COMMUNITY): Payer: Self-pay

## 2022-03-18 ENCOUNTER — Encounter: Payer: Self-pay | Admitting: Oncology

## 2022-03-18 DIAGNOSIS — I2699 Other pulmonary embolism without acute cor pulmonale: Secondary | ICD-10-CM | POA: Diagnosis not present

## 2022-03-18 LAB — BASIC METABOLIC PANEL
Anion gap: 6 (ref 5–15)
BUN: 66 mg/dL — ABNORMAL HIGH (ref 8–23)
CO2: 31 mmol/L (ref 22–32)
Calcium: 8.6 mg/dL — ABNORMAL LOW (ref 8.9–10.3)
Chloride: 102 mmol/L (ref 98–111)
Creatinine, Ser: 1.55 mg/dL — ABNORMAL HIGH (ref 0.61–1.24)
GFR, Estimated: 46 mL/min — ABNORMAL LOW (ref >60)
Glucose, Bld: 116 mg/dL — ABNORMAL HIGH (ref 70–99)
Potassium: 4.5 mmol/L (ref 3.5–5.1)
Sodium: 139 mmol/L (ref 135–145)

## 2022-03-18 LAB — CBC
HCT: 37.7 % — ABNORMAL LOW (ref 39.0–52.0)
Hemoglobin: 11.8 g/dL — ABNORMAL LOW (ref 13.0–17.0)
MCH: 30.2 pg (ref 26.0–34.0)
MCHC: 31.3 g/dL (ref 30.0–36.0)
MCV: 96.4 fL (ref 80.0–100.0)
Platelets: 145 10*3/uL — ABNORMAL LOW (ref 150–400)
RBC: 3.91 MIL/uL — ABNORMAL LOW (ref 4.22–5.81)
RDW: 14.3 % (ref 11.5–15.5)
WBC: 7.8 10*3/uL (ref 4.0–10.5)
nRBC: 0 % (ref 0.0–0.2)

## 2022-03-18 LAB — APTT: aPTT: 81 seconds — ABNORMAL HIGH (ref 24–36)

## 2022-03-18 LAB — HEPARIN LEVEL (UNFRACTIONATED)
Heparin Unfractionated: 0.66 IU/mL (ref 0.30–0.70)
Heparin Unfractionated: 0.48 IU/mL (ref 0.30–0.70)

## 2022-03-18 LAB — BLOOD GAS, VENOUS
Acid-Base Excess: 8.6 mmol/L — ABNORMAL HIGH (ref 0.0–2.0)
Bicarbonate: 35.7 mmol/L — ABNORMAL HIGH (ref 20.0–28.0)
O2 Saturation: 99.9 %
Patient temperature: 37
pCO2, Ven: 59 mmHg (ref 44–60)
pH, Ven: 7.39 (ref 7.25–7.43)
pO2, Ven: 195 mmHg — ABNORMAL HIGH (ref 32–45)

## 2022-03-18 MED ORDER — GLYCERIN (LAXATIVE) 2 G RE SUPP
1.0000 | Freq: Every day | RECTAL | Status: DC | PRN
  Administered 2022-03-18: 1 via RECTAL
  Filled 2022-03-18 (×3): qty 1

## 2022-03-18 MED ORDER — APIXABAN 5 MG PO TABS
5.0000 mg | ORAL_TABLET | Freq: Two times a day (BID) | ORAL | Status: DC
Start: 2022-03-25 — End: 2022-03-28
  Administered 2022-03-25 – 2022-03-27 (×5): 5 mg via ORAL
  Filled 2022-03-18 (×6): qty 1

## 2022-03-18 MED ORDER — APIXABAN 5 MG PO TABS
10.0000 mg | ORAL_TABLET | Freq: Two times a day (BID) | ORAL | Status: AC
Start: 2022-03-18 — End: 2022-03-24
  Administered 2022-03-18 – 2022-03-24 (×14): 10 mg via ORAL
  Filled 2022-03-18 (×15): qty 2

## 2022-03-18 NOTE — Consult Note (Signed)
ANTICOAGULATION CONSULT NOTE  ? ?Pharmacy Consult for heparin infusion ?Indication: pulmonary embolus ? ?No Known Allergies ? ?Patient Measurements: ?Height: 5\' 11"  (180.3 cm) ?Weight: 133.7 kg (294 lb 12.1 oz) ?IBW/kg (Calculated) : 75.3 ?Heparin Dosing Weight: 108.3 kg ? ?Vital Signs: ?Temp: 97.8 ?F (36.6 ?C) (05/01 0400) ?Temp Source: Axillary (05/01 0400) ?BP: 117/81 (05/01 0600) ?Pulse Rate: 88 (05/01 0500) ? ?Labs: ?Recent Labs  ?  03/16/22 ?0754 03/16/22 ?1941 03/16/22 ?1705 03/17/22 ?0433 03/17/22 ?1608 03/18/22 ?0320 03/18/22 ?1107  ?HGB 12.1*  --   --  11.9*  --  11.8*  --   ?HCT 39.5  --   --  38.8*  --  37.7*  --   ?PLT 153  --   --  143*  --  145*  --   ?APTT  --   --    < > 61* 159* 81*  --   ?HEPARINUNFRC  --   --    < > 1.01*  --  0.66 0.48  ?CREATININE 2.69*  --   --  1.94*  --  1.55*  --   ?TROPONINIHS  --  95*  --   --   --   --   --   ? < > = values in this interval not displayed.  ? ? ? ?Estimated Creatinine Clearance: 55.7 mL/min (A) (by C-G formula based on SCr of 1.55 mg/dL (H)). ? ? ?Medical History: ?Past Medical History:  ?Diagnosis Date  ? Anxiety   ? ptsd  ? Arthritis   ? Cancer [facility])   ? PROSTATE  ? CHF (congestive heart failure) ([place])   ? Chronic kidney disease   ? stones  ? COPD (chronic obstructive pulmonary disease) ([place])   ? Dysrhythmia   ? History of kidney stones   ? Hypertension   ? Kidney stone   ? Pneumonia   ? 06/27/16  ? Shortness of breath dyspnea   ? ? ?Medications:  ?Eliquis 10 mg BID -- last dose 4/26 AM  ? ?Assessment: ?Pt is 76 yo male presenting to ED 03/09/22 w/ SOB and leg edema found with "two isolated subsegmental pulmonary emboli at the bifurcation in the right upper lobe pulmonary artery." Transitioned from IV heprin to Eliquis 4/25, however switched back to heparin due to pt NPO ISO BI-PAP. Pharmacy has been consulted to transition back to Eliquis. ? ?Hgb remains stable, Plts wnl. No bleeding mentioned per notes. ? ?Date Time aPTT/HL  Rate/comment ?4/29 1705 aPTT  >160  1600 un/hr >> 1150 un/hr ?4/30  0433  aPTT=61, HL=1.01 1150 un/hr >> 1350 un/hr ?4/30  1608 aPTT 159  1350 un/hr >> 1200 un/hr ?5/01     0320   aPTT   81, HL=0.66    1200 units/hr ?5/01 1107 HL 0.48  1200 units/hr ? ? ?Goal of Therapy:  ?Heparin level 0.3-0.7 units/ml ?aPTT 66-102 seconds ?Monitor platelets by anticoagulation protocol: Yes ?  ?Plan: Give Eliquis at time of heparin discontinuation ?Eliquis (apixaban) 10 mg BID x 7 days followed by apixaban 5 mg BID thereafter. ? ?Wynelle Cleveland, PharmD ?Pharmacy Resident  ?03/18/2022 ?12:13 PM ? ?

## 2022-03-18 NOTE — Progress Notes (Signed)
PT Cancellation Note ? ?Patient Details ?Name: Edwin Donovan. ?MRN: [id number] ?DOB: 09-30-1946 ? ? ?Cancelled Treatment:    Reason Eval/Treat Not Completed: Other (comment). Pt transferred to CCU due to increased O2 requirement over weekend. PT order discontinued due to change in status. When pt medically stable to perform PT, please place new order. ? ? ?Victorina Kable ?03/18/2022, 10:44 AM ?Greggory Stallion, PT, DPT, GCS ?[phone] ? ?

## 2022-03-18 NOTE — Progress Notes (Signed)
OT Cancellation Note ? ?Patient Details ?Name: Sender Rueb. ?MRN: [id number] ?DOB: [birth year] ? ? ?Cancelled Treatment:    Reason Eval/Treat Not Completed: Medical issues which prohibited therapy. Per chart review, pt needing higher level of care and transferred to ICU. OT to complete orders at this time. Please re-consult when appropriate.  ? ?Darleen Crocker, MS, OTR/L , CBIS ?ascom [phone]  ?03/18/22, 9:24 AM  ?

## 2022-03-18 NOTE — Plan of Care (Signed)
?  Problem: Respiratory: ?Goal: Ability to maintain a clear airway will improve ?Outcome: Progressing ?Goal: Ability to maintain adequate ventilation will improve ?Outcome: Progressing ?  ?

## 2022-03-18 NOTE — Progress Notes (Signed)
? ? ? ?PULMONOLOGY ? ? ? ? ? ? ? ? ?Date: 03/18/2022,   ?MRN# [id number] Edwin Donovan. 12-28-1945 ? ? ?  ?AdmissionWeight: (!) 141.5 kg                 ?CurrentWeight: 133.7 kg ? ?Referring provider: Dr. Arbutus Ped ? ? ?CHIEF COMPLAINT:  ? ?Nonmassive PE with RUL nodule and mediastinal lymphadenopathy ? ? ?HISTORY OF PRESENT ILLNESS  ? ?This is a pleasant 76 year old male with a history of prostate CA, disorder, arthritis, CHF, CKD COPD, dysrhythmia history of nephrolithiasis and essential previous episode of pneumonia in 2017, he reports worsening cough and wheezing.  He was found to be mildly tachypneic on arrival and required 3 L of supplemental oxygen to reach normoxia at SPO2 95%.  Labs showed CKD however troponins were essentially normal in the context of CKD with only mild elevation.  Influenza and COVID-19 are negative.  CTA was performed with findings of subsegmental pulmonary emboli as well as 17 mm spiculated nodule of the right upper lobe and associated mediastinal lymphadenopathy/mass.  He had nephrectomy in the past due to renal cell carcinoma.  He quit smoking 2 years ago. PCCM consultation was placed for additional evaluation and management.  ? ? ?03/17/22- patient is improved, he is off BIPAP. He is speaking and answering appropriately to verbal communication. Have dcd IVF and tessalon.  Have ordered recruitment maneuvers with metaneb. CT chest today to eval lung as suggested by previous radiology report.  ? ? ? ? ?03/18/22 - patient is improved from respiratory perspective, he is on 6L/min and is speaking full sentences. VBG repeat today. Will start on eliquis from heparin ? ? ?PAST MEDICAL HISTORY  ? ?Past Medical History:  ?Diagnosis Date  ?? Anxiety   ? ptsd  ?? Arthritis   ?? Cancer [facility])   ? PROSTATE  ?? CHF (congestive heart failure) ([place])   ?? Chronic kidney disease   ? stones  ?? COPD (chronic obstructive pulmonary disease) ([place])   ?? Dysrhythmia   ?? History of kidney stones   ??  Hypertension   ?? Kidney stone   ?? Pneumonia   ? 06/27/16  ?? Shortness of breath dyspnea   ? ? ? ?SURGICAL HISTORY  ? ?Past Surgical History:  ?Procedure Laterality Date  ?? CYSTOSCOPY W/ RETROGRADES Bilateral 12/26/2017  ? Procedure: CYSTOSCOPY WITH RETROGRADE PYELOGRAM;  Surgeon: Abbie Sons, MD;  Location: [facility];  Service: Urology;  Laterality: Bilateral;  ?? CYSTOSCOPY W/ RETROGRADES Right 02/09/2019  ? Procedure: CYSTOSCOPY WITH RETROGRADE PYELOGRAM;  Surgeon: Abbie Sons, MD;  Location: [facility];  Service: Urology;  Laterality: Right;  ?? CYSTOSCOPY W/ URETERAL STENT PLACEMENT Right 07/29/2016  ? Procedure: CYSTOSCOPY WITH STENT REPLACEMENT;  Surgeon: Hollice Espy, MD;  Location: [facility];  Service: Urology;  Laterality: Right;  ?? CYSTOSCOPY WITH BIOPSY Right 07/29/2016  ? Procedure: CYSTOSCOPY WITH BIOPSY;  Surgeon: Hollice Espy, MD;  Location: [facility];  Service: Urology;  Laterality: Right;  ?? CYSTOSCOPY WITH BIOPSY Right 02/09/2019  ? Procedure: CYSTOSCOPY WITH BIOPSY;  Surgeon: Abbie Sons, MD;  Location: [facility];  Service: Urology;  Laterality: Right;  ?? CYSTOSCOPY WITH STENT PLACEMENT Right 06/28/2016  ? Procedure: CYSTOSCOPY WITH STENT PLACEMENT;  Surgeon: Hollice Espy, MD;  Location: [facility];  Service: Urology;  Laterality: Right;  ?? CYSTOSCOPY WITH STENT PLACEMENT Right 02/09/2019  ? Procedure: CYSTOSCOPY WITH STENT PLACEMENT;  Surgeon: Abbie Sons, MD;  Location: [facility];  Service: Urology;  Laterality: Right;  ?? CYSTOSCOPY/URETEROSCOPY/HOLMIUM LASER/STENT PLACEMENT Right 12/26/2017  ? Procedure: CYSTOSCOPY/URETEROSCOPY/HOLMIUM LASER/STENT PLACEMENT;  Surgeon: Abbie Sons, MD;  Location: [facility];  Service: Urology;  Laterality: Right;  ?? EXTRACORPOREAL SHOCK WAVE LITHOTRIPSY Right 12/04/2017  ? Procedure: EXTRACORPOREAL SHOCK WAVE LITHOTRIPSY (ESWL);  Surgeon: Hollice Espy, MD;  Location: [facility];  Service: Urology;  Laterality: Right;  ?? PROSTATECTOMY    ??  URETEROSCOPY Right 07/29/2016  ? Procedure: URETEROSCOPY;  Surgeon: Hollice Espy, MD;  Location: [facility];  Service: Urology;  Laterality: Right;  ?? URETEROSCOPY Right 02/09/2019  ? Procedure: URETEROSCOPY- DIAGNOSTIC;  Surgeon: Abbie Sons, MD;  Location: [facility];  Service: Urology;  Laterality: Right;  ?? VIDEO BRONCHOSCOPY WITH ENDOBRONCHIAL NAVIGATION Left 03/15/2022  ? Procedure: VIDEO BRONCHOSCOPY WITH ENDOBRONCHIAL NAVIGATION;  Surgeon: Ottie Glazier, MD;  Location: [facility];  Service: Thoracic;  Laterality: Left;  ? ? ? ?FAMILY HISTORY  ? ?Family History  ?Problem Relation Age of Onset  ?? Stroke Mother   ?? Heart attack Father   ?? Lung cancer Father   ?? Kidney disease Father   ?? Prostate cancer Neg Hx   ?? Bladder Cancer Neg Hx   ? ? ? ?SOCIAL HISTORY  ? ?Social History  ? ?Tobacco Use  ?? Smoking status: Some Days  ?  Packs/day: 0.50  ?  Types: Cigarettes  ?? Smokeless tobacco: Never  ?Vaping Use  ?? Vaping Use: Never used  ?Substance Use Topics  ?? Alcohol use: Yes  ?  Alcohol/week: 14.0 standard drinks  ?  Types: 14 Cans of beer per week  ?? Drug use: Yes  ?  Types: Marijuana  ?  Comment: "sometimes, not very often"  ? ? ? ?MEDICATIONS  ? ? ?Home Medication:  ?Current Outpatient Rx  ?? Order #: [id number] Class: No Print  ?? Order #: [id number] Class: No Print  ?? Order #: [id number] Class: No Print  ?? Order #: [id number] Class: No Print  ? ?  ?Current Medication: ? ?Current Facility-Administered Medications:  ??  acetaminophen (TYLENOL) tablet 650 mg, 650 mg, Oral, Q6H PRN, 650 mg at 03/16/22 0548 **OR** acetaminophen (TYLENOL) suppository 650 mg, 650 mg, Rectal, Q6H PRN, Mansy, Jan A, MD ??  albuterol (PROVENTIL) (2.5 MG/3ML) 0.083% nebulizer solution 3 mL, 3 mL, Inhalation, Q4H PRN, Nicole Kindred A, DO, 3 mL at 03/17/22 1458 ??  atorvastatin (LIPITOR) tablet 80 mg, 80 mg, Oral, Daily, Mansy, Jan A, MD, 80 mg at 03/15/22 1202 ??  bisacodyl (DULCOLAX) EC tablet 5 mg, 5 mg, Oral, Daily PRN,  Nicole Kindred A, DO, 5 mg at 03/14/22 1001 ??  chlorhexidine (PERIDEX) 0.12 % solution 15 mL, 15 mL, Mouth Rinse, BID, Wouk, Ailene Rud, MD, 15 mL at 03/17/22 2102 ??  Chlorhexidine Gluconate Cloth 2 % PADS 6 each, 6 each, Topical, Q0600, Wouk, Ailene Rud, MD, 6 each at 03/18/22 0430 ??  heparin ADULT infusion 100 units/mL (25000 units/234mL), 1,200 Units/hr, Intravenous, Continuous, Darnelle Bos, RPH, Last Rate: 12 mL/hr at 03/18/22 0600, 1,200 Units/hr at 03/18/22 0600 ??  ipratropium-albuterol (DUONEB) 0.5-2.5 (3) MG/3ML nebulizer solution 3 mL, 3 mL, Nebulization, Q6H PRN, Ottie Glazier, MD, 3 mL at 03/17/22 2127 ??  MEDLINE mouth rinse, 15 mL, Mouth Rinse, q12n4p, Wouk, Ailene Rud, MD, 15 mL at 03/17/22 1600 ??  mupirocin ointment (BACTROBAN) 2 % 1 application., 1 application., Nasal, BID, Wouk, Ailene Rud, MD, 1 application. at 03/17/22 2111 ??  polyethylene glycol (MIRALAX / GLYCOLAX) packet 17 g, 17 g, Oral,  Daily, Nicole Kindred A, DO, 17 g at 03/15/22 1201 ??  predniSONE (DELTASONE) tablet 20 mg, 20 mg, Oral, Q breakfast, Wouk, Ailene Rud, MD ??  senna-docusate (Senokot-S) tablet 1 tablet, 1 tablet, Oral, BID, Nicole Kindred A, DO, 1 tablet at 03/17/22 2102 ??  sodium phosphate (FLEET) 7-19 GM/118ML enema 1 enema, 1 enema, Rectal, Daily PRN, Si Raider, Ailene Rud, MD, 1 enema at 03/16/22 0439 ??  tamsulosin (FLOMAX) capsule 0.4 mg, 0.4 mg, Oral, QPC supper, Wouk, Ailene Rud, MD, 0.4 mg at 03/17/22 1848 ??  traZODone (DESYREL) tablet 200 mg, 200 mg, Oral, QHS PRN, Si Raider, Ailene Rud, MD, 200 mg at 03/17/22 2105 ? ? ? ?ALLERGIES  ? ?Patient has no known allergies. ? ? ? ? ?REVIEW OF SYSTEMS  ? ? ?Review of Systems: ? ?Gen:  Denies  fever, sweats, chills weigh loss  ?HEENT: Denies blurred vision, double vision, ear pain, eye pain, hearing loss, nose bleeds, sore throat ?Cardiac:  No dizziness, chest pain or heaviness, chest tightness,edema ?Resp:   reports dyspnea chronically  ?Gi: Denies  swallowing difficulty, stomach pain, nausea or vomiting, diarrhea, constipation, bowel incontinence ?Gu:  Denies bladder incontinence, burning urine ?Ext:   Denies Joint pain, stiffness or swelling ?Skin: Denies  skin r

## 2022-03-18 NOTE — Progress Notes (Signed)
Speech Language Pathology Treatment:    ?Patient Details ?Name: Edwin Donovan. ?MRN: [id number] ?DOB: 08/07/1946 ?Today's Date: 03/18/2022 ?Time: 1240-1250 ?SLP Time Calculation (min) (ACUTE ONLY): 10 min ? ?Assessment / Plan / Recommendation ?Clinical Impression ? Pt seen for diet tolerance. Pt alert, pleasant, and cooperative. On 5L/min O2 via HFNC. Baseline congested cough noted prior to POs.  ? ?Observed pt with single straw sips of OJ (~4 oz). Pt tolerated without overt s/sx pharyngeal dysphagia. Vital remains stable and no change to vocal quality noted with trials. Pt declined further PO trials, but endorsed preference for mech soft textures (vs regular) given dental status and respiratory status.  ? ?Per chart review, temp and WBC WNL. Chest CT, 4/30, "1. No evidence of pneumothorax or pneumomediastinum. ?2. Unchanged large central LEFT lung mass with mediastinal invasion and bulky LEFT hilar/perihilar soft tissue/adenopathy. 3. Slight improvement in LEFT LOWER lung atelectasis/consolidation. Small LEFT pleural effusion again noted. 4. Unchanged 1.8 cm RIGHT UPPER lobe nodule, suspicious for ?metastatic disease or additional primary malignancy. 5. Coronary artery disease. 6. Aortic Atherosclerosis (ICD10-I70.0) and Emphysema (ICD10-J43.9)." ? ?Spoke with RN. Per RN, pt tolerating current diet without overt s/sx pharyngeal dysphagia.  ? ?Recommend continuation of a mech soft diet with thin liquids and safe swallowing strategies/aspiration precautions as outlined below.  ? ?SLP to sign off as pt has no acute SLP needs at this time.  ? ?Pt and RN made aware of results, recommendations, and SLP POC. Pt verbalized understanding/agreement.  ?  ?HPI HPI: Per H&P, 03/09/22, "Edwin Donovan. is a 76 y.o. male with medical history significant for COPD, CHF, hypertension and CKD, who presented to the ER with acute onset of worsening lower extremity edema with associated orthopnea and dyspnea worsening on  exertion over the last couple weeks.  He admitted to worsening cough as well as wheezing.  His symptoms have been getting significantly worse over the last couple of days.  No fever or chills.  No nausea or vomiting or abdominal pain.  He denies any chest pain or palpitations.  No dysuria, oliguria or hematuria or flank pain.  No bleeding diathesis.     ED Course: Upon presentation to the ER, respiratory it was 22 and pulse currently 95% on 3 L of O2 by nasal cannula with otherwise normal vital signs.  Labs revealed a BUN of 55 and creatinine 1.62 and high-sensitivity troponin was 30 and later 32.  CBC showed mild anemia and thrombocytopenia.  Influenza antigens and COVID-19 PCR came back negative.  Coagulation profile was within normal.  EKG as reviewed by me : Sinus rhythm with a rate of 78 with low voltage QRS  Imaging: For which x-ray showed large masslike opacity in the left hilum.  Chest CTA revealed the following:  Two isolated subsegmental pulmonary emboli at the bifurcation in the  right upper lobe pulmonary artery. Overall clot burden is very  small.     17 mm spiculated nodule in the posterior right upper lobe.  Associated 10.0 x 7.4 cm left mediastinal mass extending into the  medial left upper lobe and left perihilar region. This appearance  raises concern for primary bronchogenic neoplasm such as small cell  lung cancer.     Critical Value/emergent results were called by telephone at the time  of interpretation on 03/09/2022 at 2:46 am to provider Dr Pryor Curia, who verbally acknowledged these results.     Aortic Atherosclerosis (ICD10-I70.0) and Emphysema (ICD10-J43.9).    The patient  was given DuoNebs, IV Solu-Medrol and was started on IV heparin with bolus and drip.  He is admitted to a progressive unit bed for further evaluation and management." ?  ?   ?SLP Plan ? All goals met ? ?  ?  ?Recommendations for follow up therapy are one component of a multi-disciplinary discharge planning process, led  by the attending physician.  Recommendations may be updated based on patient status, additional functional criteria and insurance authorization. ?  ? ?Recommendations  ?Diet recommendations: Dysphagia 3 (mechanical soft);Thin liquid ?Liquids provided via: Cup;Straw;Teaspoon ?Medication Administration:  (as tolerated) ?Supervision: Patient able to self feed ?Postural Changes and/or Swallow Maneuvers: Seated upright 90 degrees;Upright 30-60 min after meal  ?   ?    ?   ? ? ? ? Oral Care Recommendations: Oral care BID;Patient independent with oral care (setup) ?Follow Up Recommendations:  (no f/u recommended for dysphagia) ?Assistance recommended at discharge:  (anticipate need for some level of assistance given deconditioned state) ?SLP Visit Diagnosis: Dysphagia, oral phase (R13.11) ?Plan: All goals met ? ? ? ? ?  ?  ? ?Cherrie Gauze, M.S., CCC-SLP ?Speech-Language Pathologist ?[facility] ?[phone] ([place]) ?Pt se ?Quintella Baton ? ?03/18/2022, 1:44 PM ?

## 2022-03-18 NOTE — Progress Notes (Signed)
?Progress Note ? ? ?Patient: Edwin Donovan. WGN:[id number] DOB: 1946-05-04 DOA: 03/09/2022     9 ?DOS: the patient was seen and examined on 03/18/2022 ?  ?Brief hospital course: ?Edwin Donovan. is a 76 y.o. male with past medical history COPD not on home oxygen prior to recent hospital d/c on 7/84/69, chronic diastolic CHF, hypertension and CKD stage II with hx or Right nephrectomy.  He presented to the ED early AM on 03/09/2022 with progressively worsening lower extremity edema, significant orthopnea, dyspnea worsening on exertion over the last couple weeks, in addition to worsening cough with wheezing.   ?  ?ED Course -- RR 22, spO2 95% on 3 L/min [place] O2, otherwise stable vitals.  Labs showed AKI, mild anemia, mild thrombocytopenia, minimally elevated hs-troponin which trended flat. ?  ?Imaging -- ?Chest x-ray - large masslike opacity in the left hilum. ?Chest CTA  - two isolated subsegmental pulmonary emboli at the bifurcation in the right upper lobe pulmonary artery. Overall clot burden is very small.    ?17 mm spiculated nodule in the posterior right upper lobe.  Associated 10.0 x 7.4 cm left mediastinal mass extending into the medial left upper lobe and left perihilar region. This appearance raises concern for primary bronchogenic neoplasm such as small cell lung cancer. ?  ?Admitted to hospitalist service with pulmonology, oncology and palliative care consulted.  ? ? ?Assessment and Plan: ? ?Acute respiratory failure ?AM on 4/29 somnolent, increased o2 requirement, hypercarbic. CXR stable. Started on bipap and moved to ICU. Now requiring intermittent bipap, currently stable on 6 L [place] O2. Respiratory status is tenuous ?- bipap prn ?- cont [place] o2 ?- pulm following ? ?Urinary retention> ?Appears chronic. Renal u/s with distended bladder, no hydro. Patient is able to void. Suspect chronic 2/2 bph. Was treated for prostate cancer, unclear how treated, also urotherial carcinoma s/p right  nephrectomy. ?- flomax started ?- monitor for acute retention ? ?Constipation ?- cont mirlax/senna ?- add suppository ?- enema if suppository not successful ? ?Acute pulmonary embolism ([place]) ?Started on heparin, then eliquis, then held for bronch, pulm thinks safe to resume, currently on heparin, will discuss w/ pulm transitioning back to eliquis today ? ?COPD with acute exacerbation ([place]) ?Acute hypoxic respiratory failure ?Presented with wheezing and increased O2 requirement.  Started on IV steroids on admission, transitioned to prednisone. ?--Continue prednisone, continue tapering, currently on 20 ?-- s/p 5 days Zithromax 250 mg  ?--Duonebs and PRN albuterol nebs ?--Antitussives, mucolytics ?--Pulmonary hygiene ? ?Acute on chronic diastolic CHF (congestive heart failure) ([place]) ?Echo this admission on 4/22 technically limited study with preserved EF, but diastolic parameters indeterminate. ?Started on IV Lasix BID on admission. ?Edema improved ?- lasix on hold now 2/2 hypotension, AKI ?--Toprol-XL 100 mg -- prescribe at d/c if bp improves, currently bb on hold ?--Strict I/O's, daily weights ? ?Nodule of upper lobe of right lung ?17 mm spiculated nodule in the posterior right upper lobe. ?Associated 10.0 x 7.4 cm left mediastinal mass extending into the ?medial left upper lobe and left perihilar region. Appears malignant. Mri no signs mets. Bronch with biopsies 4/28, path pending ?-Pulmonary, Oncology, Palliative Care are consulted ?- outpt f/u dr. Janese Banks of oncology. PET ordered ? ?Obesity, Class III, BMI 40-49.9 (morbid obesity) ([place]) ?Body mass index is 40.53 kg/m?[place] Kitchen ?Complicates overall care and prognosis.  Recommend lifestyle modifications including physical activity and diet for weight loss and overall long-term health. ? ?Lupus erythematosus ?Noted in chart review of [place] records. ?  No apparent acute issues. ? ?History of renal carcinoma ?S/p right nephrectomy, now solitary left kidney.  Monitor renal function and  urine output closely. ? ?AKI (acute kidney injury) ([place]) ?Vs progression of ckd. Cr was 1 in 2020, here has held steady around 1.5, up to 2s in setting of hypotension. Improving today, cr 1.55 ?Avoid nephrotoxins, hypotension ?Maintain MAP>65 ?Renally dose meds ? ?Dyslipidemia ?Continue statin  ? ?Essential hypertension ?Stable off meds ? ? ?Subjective: breathing stable. No bm. No sig cough. No chest pain. Tolerating diet.  ? ?Physical Exam: ?Vitals:  ? 03/18/22 0300 03/18/22 0400 03/18/22 0500 03/18/22 0600  ?BP: 118/75 107/68 120/76 117/81  ?Pulse: 89 91 88   ?Resp: 17 18 17 18   ?Temp:  97.8 ?F (36.6 ?C)    ?TempSrc:  Axillary    ?SpO2: 94% 94% 94%   ?Weight:   133.7 kg   ?Height:      ? ?General exam: awake, alert, on bipap ?HEENT: moist mucus membranes, hearing grossly normal  ?Respiratory system: scattered rhonchi ?Cardiovascular system: normal S1/S2, RRR, resolved lower extremity edema.  ?Central nervous system: A&O x 4. no gross focal neurologic deficits, normal speech ?Extremities: trace edema ?Skin: dry, intact, cool fingers ?Psychiatry: calm, aaox3 ? ? ?Family Communication: sig other gwendolyn updated telephonically 4/29. ? ?Disposition: ?Status is: Inpatient ?Remains inpatient appropriate because: monitoring post-procedure ? ? Planned Discharge Destination: Skilled nursing facility ? ?Time spent: 30 minutes ? ?Author: ?Desma Maxim, MD ?03/18/2022 11:05 AM ? ?For on call review [email or web address].  ?

## 2022-03-18 NOTE — TOC Benefit Eligibility Note (Signed)
Patient Advocate Encounter ?  ?Insurance verification completed.   ?  ?The patient is currently admitted and upon discharge could be taking ELIQUIS STARTER PACK (10MG  & 5MG ). ?  ?The current 30 day co-pay is, $47.  ? ?The patient is currently admitted and upon discharge could be taking ELIQUIS 5mg   ? ?The current 30 day co-pay is, $47.  ? ? ?The patient is insured through [employer] [employer]. ? ? ?  ? ?

## 2022-03-18 NOTE — Consult Note (Signed)
ANTICOAGULATION CONSULT NOTE  ? ?Pharmacy Consult for heparin infusion ?Indication: pulmonary embolus ? ?No Known Allergies ? ?Patient Measurements: ?Height: 5\' 11"  (180.3 cm) ?Weight: 131.1 kg (289 lb 0.4 oz) ?IBW/kg (Calculated) : 75.3 ?Heparin Dosing Weight: 108.3 kg ? ?Vital Signs: ?Temp: 98.2 ?F (36.8 ?C) (05/01 0000) ?Temp Source: Oral (05/01 0000) ?BP: 118/75 (05/01 0200) ?Pulse Rate: 91 (05/01 0200) ? ?Labs: ?Recent Labs  ?  03/16/22 ?8250 03/16/22 ?5397 03/16/22 ?0755 03/16/22 ?1705 03/17/22 ?0433 03/17/22 ?1608 03/18/22 ?0320  ?HGB 12.1*  --   --   --  11.9*  --  11.8*  ?HCT 39.5  --   --   --  38.8*  --  37.7*  ?PLT 153  --   --   --  143*  --  145*  ?APTT  --    < >  --  >160* 61* 159* 81*  ?HEPARINUNFRC  --   --   --  >1.10* 1.01*  --  0.66  ?CREATININE 2.69*  --   --   --  1.94*  --  1.55*  ?TROPONINIHS  --   --  95*  --   --   --   --   ? < > = values in this interval not displayed.  ? ? ? ?Estimated Creatinine Clearance: 55.1 mL/min (A) (by C-G formula based on SCr of 1.55 mg/dL (H)). ? ? ?Medical History: ?Past Medical History:  ?Diagnosis Date  ? Anxiety   ? ptsd  ? Arthritis   ? Cancer [facility])   ? PROSTATE  ? CHF (congestive heart failure) ([place])   ? Chronic kidney disease   ? stones  ? COPD (chronic obstructive pulmonary disease) ([place])   ? Dysrhythmia   ? History of kidney stones   ? Hypertension   ? Kidney stone   ? Pneumonia   ? 06/27/16  ? Shortness of breath dyspnea   ? ? ?Medications:  ?Eliquis 10 mg BID -- last dose 4/26 AM  ? ?Assessment: ?Pt is 76 yo male presenting to ED 03/09/22 w/ SOB and leg edema found with "two isolated subsegmental pulmonary emboli at the bifurcation in the right upper lobe pulmonary artery." Initially started on IV heparin therapy then transition to Eliquis 03/12/22. Eliquis held following 3 doses for bronchoscopy. Plan to resume anticoagulation therapy 03/16/22; however, patient NPO ISO BI-PAP. Pharmacy consulted to transition back to to IV heparin therapy   ? ?Date Time aPTT/HL  Rate/comment ?4/29 1705 aPTT >160  1600 un/hr >> 1150 un/hr ?4/30  0433  aPTT=61, HL=1.01 1150 un/hr >> 1350 un/hr ?4/30  1608 aPTT 159  1350 un/hr >> 1200 un/hr ?5/01     0320   aPTT   81, HL=0.66     1200 units/hr ? ? ?Goal of Therapy:  ?Heparin level 0.3-0.7 units/ml ?aPTT 66-102 seconds ?Monitor platelets by anticoagulation protocol: Yes ?  ?Plan:  ?5/1 @ 0320:  aPTT = 81, HL = 0.66, therapeutic X 1 ?aPTT and HL are both therapeutic X 1.  Will use HL to guide dosing from here on. ?Will draw confirmation HL in 8 hrs on 5/1 @ 1100.  ? ?Brighton Pilley D, PharmD ?Clinical Pharmacist ?03/18/2022 ?3:48 AM ? ? ? ?

## 2022-03-19 ENCOUNTER — Inpatient Hospital Stay: Payer: Medicare Other

## 2022-03-19 DIAGNOSIS — I2699 Other pulmonary embolism without acute cor pulmonale: Secondary | ICD-10-CM | POA: Diagnosis not present

## 2022-03-19 DIAGNOSIS — C3412 Malignant neoplasm of upper lobe, left bronchus or lung: Secondary | ICD-10-CM | POA: Diagnosis not present

## 2022-03-19 LAB — BASIC METABOLIC PANEL
Anion gap: 7 (ref 5–15)
BUN: 62 mg/dL — ABNORMAL HIGH (ref 8–23)
CO2: 29 mmol/L (ref 22–32)
Calcium: 8.9 mg/dL (ref 8.9–10.3)
Chloride: 104 mmol/L (ref 98–111)
Creatinine, Ser: 1.48 mg/dL — ABNORMAL HIGH (ref 0.61–1.24)
GFR, Estimated: 48 mL/min — ABNORMAL LOW (ref >60)
Glucose, Bld: 125 mg/dL — ABNORMAL HIGH (ref 70–99)
Potassium: 4.7 mmol/L (ref 3.5–5.1)
Sodium: 140 mmol/L (ref 135–145)

## 2022-03-19 LAB — CYTOLOGY - NON PAP

## 2022-03-19 LAB — SURGICAL PATHOLOGY

## 2022-03-19 MED ORDER — PREDNISONE 10 MG PO TABS
10.0000 mg | ORAL_TABLET | Freq: Every day | ORAL | Status: DC
  Administered 2022-03-20: 10 mg via ORAL
  Filled 2022-03-19: qty 1

## 2022-03-19 MED ORDER — IOHEXOL 9 MG/ML PO SOLN
500.0000 mL | ORAL | Status: AC
  Administered 2022-03-19 (×2): 500 mL via ORAL

## 2022-03-19 NOTE — Progress Notes (Signed)
? ? ? ?Hematology/Oncology Consult note ?[place]  ?[phone] [phone] ? ?Patient Care Team: ?System, Provider Not In as PCP - General ?Telford Nab, RN as Oncology Nurse Navigator  ? ?Name of the patient: Edwin Donovan  ?[id number]  ?12-22-1945  ? ?Date of visit: 03/19/2022 ? ? ? ?Interval history-patient reports ongoing fatigue and exertional shortness of breath.  He hopes to go to a rehab soon. ? ?ECOG PS- 3 ?Pain scale- 0 ? ? ?Review of systems- Review of Systems  ?Constitutional:  Positive for malaise/fatigue. Negative for chills, fever and weight loss.  ?HENT:  Negative for congestion, ear discharge and nosebleeds.   ?Eyes:  Negative for blurred vision.  ?Respiratory:  Positive for shortness of breath. Negative for cough, hemoptysis, sputum production and wheezing.   ?Cardiovascular:  Negative for chest pain, palpitations, orthopnea and claudication.  ?Gastrointestinal:  Negative for abdominal pain, blood in stool, constipation, diarrhea, heartburn, melena, nausea and vomiting.  ?Genitourinary:  Negative for dysuria, flank pain, frequency, hematuria and urgency.  ?Musculoskeletal:  Negative for back pain, joint pain and myalgias.  ?Skin:  Negative for rash.  ?Neurological:  Negative for dizziness, tingling, focal weakness, seizures, weakness and headaches.  ?Endo/Heme/Allergies:  Does not bruise/bleed easily.  ?Psychiatric/Behavioral:  Negative for depression and suicidal ideas. The patient does not have insomnia.    ? ? ?No Known Allergies ? ? ?Past Medical History:  ?Diagnosis Date  ? Anxiety   ? ptsd  ? Arthritis   ? Cancer [facility])   ? PROSTATE  ? CHF (congestive heart failure) ([place])   ? Chronic kidney disease   ? stones  ? COPD (chronic obstructive pulmonary disease) ([place])   ? Dysrhythmia   ? History of kidney stones   ? Hypertension   ? Kidney stone   ? Pneumonia   ? 06/27/16  ? Shortness of breath dyspnea   ? ? ? ?Past Surgical History:  ?Procedure Laterality  Date  ? CYSTOSCOPY W/ RETROGRADES Bilateral 12/26/2017  ? Procedure: CYSTOSCOPY WITH RETROGRADE PYELOGRAM;  Surgeon: Abbie Sons, MD;  Location: [facility];  Service: Urology;  Laterality: Bilateral;  ? CYSTOSCOPY W/ RETROGRADES Right 02/09/2019  ? Procedure: CYSTOSCOPY WITH RETROGRADE PYELOGRAM;  Surgeon: Abbie Sons, MD;  Location: [facility];  Service: Urology;  Laterality: Right;  ? CYSTOSCOPY W/ URETERAL STENT PLACEMENT Right 07/29/2016  ? Procedure: CYSTOSCOPY WITH STENT REPLACEMENT;  Surgeon: Hollice Espy, MD;  Location: [facility];  Service: Urology;  Laterality: Right;  ? CYSTOSCOPY WITH BIOPSY Right 07/29/2016  ? Procedure: CYSTOSCOPY WITH BIOPSY;  Surgeon: Hollice Espy, MD;  Location: [facility];  Service: Urology;  Laterality: Right;  ? CYSTOSCOPY WITH BIOPSY Right 02/09/2019  ? Procedure: CYSTOSCOPY WITH BIOPSY;  Surgeon: Abbie Sons, MD;  Location: [facility];  Service: Urology;  Laterality: Right;  ? CYSTOSCOPY WITH STENT PLACEMENT Right 06/28/2016  ? Procedure: CYSTOSCOPY WITH STENT PLACEMENT;  Surgeon: Hollice Espy, MD;  Location: [facility];  Service: Urology;  Laterality: Right;  ? CYSTOSCOPY WITH STENT PLACEMENT Right 02/09/2019  ? Procedure: CYSTOSCOPY WITH STENT PLACEMENT;  Surgeon: Abbie Sons, MD;  Location: [facility];  Service: Urology;  Laterality: Right;  ? CYSTOSCOPY/URETEROSCOPY/HOLMIUM LASER/STENT PLACEMENT Right 12/26/2017  ? Procedure: CYSTOSCOPY/URETEROSCOPY/HOLMIUM LASER/STENT PLACEMENT;  Surgeon: Abbie Sons, MD;  Location: [facility];  Service: Urology;  Laterality: Right;  ? EXTRACORPOREAL SHOCK WAVE LITHOTRIPSY Right 12/04/2017  ? Procedure: EXTRACORPOREAL SHOCK WAVE LITHOTRIPSY (ESWL);  Surgeon: Hollice Espy, MD;  Location: [facility];  Service: Urology;  Laterality: Right;  ? PROSTATECTOMY    ? URETEROSCOPY Right 07/29/2016  ? Procedure: URETEROSCOPY;  Surgeon: Hollice Espy, MD;  Location: [facility];  Service: Urology;  Laterality: Right;  ? URETEROSCOPY Right 02/09/2019   ? Procedure: URETEROSCOPY- DIAGNOSTIC;  Surgeon: Abbie Sons, MD;  Location: [facility];  Service: Urology;  Laterality: Right;  ? VIDEO BRONCHOSCOPY WITH ENDOBRONCHIAL NAVIGATION Left 03/15/2022  ? Procedure: VIDEO BRONCHOSCOPY WITH ENDOBRONCHIAL NAVIGATION;  Surgeon: Ottie Glazier, MD;  Location: [facility];  Service: Thoracic;  Laterality: Left;  ? ? ?Social History  ? ?Socioeconomic History  ? Marital status: Married  ?  Spouse name: Caleen Essex  ? Number of children: 1  ? Years of education: Not on file  ? Highest education level: Not on file  ?Occupational History  ? Occupation: retired and disabled  ?Tobacco Use  ? Smoking status: Some Days  ?  Packs/day: 0.50  ?  Types: Cigarettes  ? Smokeless tobacco: Never  ?Vaping Use  ? Vaping Use: Never used  ?Substance and Sexual Activity  ? Alcohol use: Yes  ?  Alcohol/week: 14.0 standard drinks  ?  Types: 14 Cans of beer per week  ? Drug use: Yes  ?  Types: Marijuana  ?  Comment: "sometimes, not very often"  ? Sexual activity: Yes  ?  Birth control/protection: None  ?Other Topics Concern  ? Not on file  ?Social History Narrative  ? Not on file  ? ?Social Determinants of Health  ? ?Financial Resource Strain: Not on file  ?Food Insecurity: Not on file  ?Transportation Needs: Not on file  ?Physical Activity: Not on file  ?Stress: Not on file  ?Social Connections: Not on file  ?Intimate Partner Violence: Not on file  ? ? ?Family History  ?Problem Relation Age of Onset  ? Stroke Mother   ? Heart attack Father   ? Lung cancer Father   ? Kidney disease Father   ? Prostate cancer Neg Hx   ? Bladder Cancer Neg Hx   ? ? ? ?Current Facility-Administered Medications:  ?  acetaminophen (TYLENOL) tablet 650 mg, 650 mg, Oral, Q6H PRN, 650 mg at 03/16/22 0548 **OR** acetaminophen (TYLENOL) suppository 650 mg, 650 mg, Rectal, Q6H PRN, Mansy, Jan A, MD ?  albuterol (PROVENTIL) (2.5 MG/3ML) 0.083% nebulizer solution 3 mL, 3 mL, Inhalation, Q4H PRN, Nicole Kindred A, DO, 3 mL at 03/19/22  0809 ?  apixaban (ELIQUIS) tablet 10 mg, 10 mg, Oral, BID, 10 mg at 03/19/22 1047 **FOLLOWED BY** [START ON 03/25/2022] apixaban (ELIQUIS) tablet 5 mg, 5 mg, Oral, BID, Wynelle Cleveland, RPH ?  atorvastatin (LIPITOR) tablet 80 mg, 80 mg, Oral, Daily, Mansy, Jan A, MD, 80 mg at 03/19/22 1048 ?  bisacodyl (DULCOLAX) EC tablet 5 mg, 5 mg, Oral, Daily PRN, Nicole Kindred A, DO, 5 mg at 03/14/22 1001 ?  chlorhexidine (PERIDEX) 0.12 % solution 15 mL, 15 mL, Mouth Rinse, BID, Wouk, Ailene Rud, MD, 15 mL at 03/19/22 1048 ?  Chlorhexidine Gluconate Cloth 2 % PADS 6 each, 6 each, Topical, Q0600, Wouk, Ailene Rud, MD, 6 each at 03/18/22 0430 ?  Glycerin (Adult) 2 g suppository 1 suppository, 1 suppository, Rectal, Daily PRN, Si Raider, Ailene Rud, MD, 1 suppository at 03/18/22 1225 ?  ipratropium-albuterol (DUONEB) 0.5-2.5 (3) MG/3ML nebulizer solution 3 mL, 3 mL, Nebulization, Q6H PRN, Ottie Glazier, MD, 3 mL at 03/17/22 2127 ?  MEDLINE mouth rinse, 15 mL, Mouth Rinse, q12n4p, Wouk, Ailene Rud, MD, 15 mL  at 03/19/22 1221 ?  polyethylene glycol (MIRALAX / GLYCOLAX) packet 17 g, 17 g, Oral, Daily, Nicole Kindred A, DO, 17 g at 03/19/22 1048 ?  [START ON 03/20/2022] predniSONE (DELTASONE) tablet 10 mg, 10 mg, Oral, Q breakfast, Aleskerov, Fuad, MD ?  senna-docusate (Senokot-S) tablet 1 tablet, 1 tablet, Oral, BID, Nicole Kindred A, DO, 1 tablet at 03/19/22 1048 ?  sodium phosphate (FLEET) 7-19 GM/118ML enema 1 enema, 1 enema, Rectal, Daily PRN, Si Raider, Ailene Rud, MD, 1 enema at 03/16/22 0439 ?  tamsulosin (FLOMAX) capsule 0.4 mg, 0.4 mg, Oral, QPC supper, Wouk, Ailene Rud, MD, 0.4 mg at 03/18/22 1803 ?  traZODone (DESYREL) tablet 200 mg, 200 mg, Oral, QHS PRN, Si Raider, Ailene Rud, MD, 200 mg at 03/17/22 2105 ? ?Physical exam:  ?Vitals:  ? 03/19/22 0400 03/19/22 0800 03/19/22 0812 03/19/22 1100  ?BP: 124/79 131/85  126/82  ?Pulse: 88 82  93  ?Resp: 17 17  20   ?Temp:  98.1 ?F (36.7 ?C)  98 ?F (36.7 ?C)  ?TempSrc:       ?SpO2: 99% 95% 97% 98%  ?Weight:      ?Height:      ? ?Physical Exam ?Constitutional:   ?   Comments: Pt on chronic O2  ?Cardiovascular:  ?   Rate and Rhythm: Normal rate and regular rhythm.  ?   Heart s

## 2022-03-19 NOTE — TOC Progression Note (Addendum)
Transition of Care (TOC) - Progression Note  ? ? ?Patient Details  ?Name: Edwin Donovan. ?MRN: [id number] ?Date of Birth: Dec 20, 1945 ? ?Transition of Care (TOC) CM/SW Contact  ?Candie Chroman, LCSW ?Phone Number: ?03/19/2022, 10:47 AM ? ?Clinical Narrative:   Notified [facility] admissions coordinator that patient may need QHS Bipap at discharge. Insurance authorization expires after today. ? ?3:12 pm: Updated FL2 with bipap information so SNF can order. They cannot order until they know for sure that patient is stable for discharge because there is a daily charge they have to pay for the machine even if not being used. [facility]. Josem Kaufmann is still valid as long as patient admits by tomorrow at midnight. Patient still on HFNC 6 L. ? ?Expected Discharge Plan: [place] ?Barriers to Discharge: Continued Medical Work up ? ?Expected Discharge Plan and Services ?Expected Discharge Plan: [place] ?  ?  ?Post Acute Care Choice: [place] ?Living arrangements for the past 2 months: [name] ?Expected Discharge Date: 03/12/22               ?  ?  ?  ?  ?  ?  ?  ?  ?  ?  ? ? ?Social Determinants of Health (SDOH) Interventions ?  ? ?Readmission Risk Interventions ? ?  03/13/2022  ?  3:42 PM  ?Readmission Risk Prevention Plan  ?Transportation Screening Complete  ?PCP or Specialist Appt within 3-5 Days Complete  ?[place] or Home Care Consult Complete  ?Social Work Consult for [place] Planning/Counseling Complete  ?Palliative Care Screening Not Applicable  ?Medication Review Press photographer) Complete  ? ? ?

## 2022-03-19 NOTE — Progress Notes (Addendum)
?Progress Note ? ? ?Patient: Edwin Donovan. YBO:[id number] DOB: [birth year] DOA: 03/09/2022     10 ?DOS: the patient was seen and examined on 03/19/2022 ?  ?Brief hospital course: ?Edwin Donovan. is a 76 y.o. male with past medical history COPD not on home oxygen prior to recent hospital d/c on 2/77/82, chronic diastolic CHF, hypertension and CKD stage II with hx or Right nephrectomy.  He presented to the ED early AM on 03/09/2022 with progressively worsening lower extremity edema, significant orthopnea, dyspnea worsening on exertion over the last couple weeks, in addition to worsening cough with wheezing.   ?  ?ED Course -- RR 22, spO2 95% on 3 L/min [place] O2, otherwise stable vitals.  Labs showed AKI, mild anemia, mild thrombocytopenia, minimally elevated hs-troponin which trended flat. ?  ?Imaging -- ?Chest x-ray - large masslike opacity in the left hilum. ?Chest CTA  - two isolated subsegmental pulmonary emboli at the bifurcation in the right upper lobe pulmonary artery. Overall clot burden is very small.    ?17 mm spiculated nodule in the posterior right upper lobe.  Associated 10.0 x 7.4 cm left mediastinal mass extending into the medial left upper lobe and left perihilar region. This appearance raises concern for primary bronchogenic neoplasm such as small cell lung cancer. ?  ?Admitted to hospitalist service with pulmonology, oncology and palliative care consulted.  ? ? ?Assessment and Plan: ? ?Acute respiratory failure ?AM on 4/29 somnolent, increased o2 requirement, hypercarbic. CXR stable. Started on bipap and moved to ICU. Now requiring intermittent bipap, currently stable on 6 L [place] O2. Respiratory status is tenuous but slowly improving. Vbg from yesterday afternoon improved ?- bipap prn ?- cont [place] o2, once weaned off high-flow and stable can d/c to snf ?- pulm following ? ?Urinary retention, chronic ?Appears chronic. Renal u/s with distended bladder, no hydro. Patient is able to void.  Suspect chronic 2/2 bph. Was treated for prostate cancer, unclear how treated, also urotherial carcinoma s/p right nephrectomy. ?- flomax started ?- monitor for acute retention ? ?Constipation ?Multiple days w/o bm. Feels fine ?- cont mirlax/senna ?- add suppository ?- enema today ? ?Acute pulmonary embolism ([place]) ?Started on heparin, then eliquis, then held for bronch, now apixaban re-started ? ?COPD with acute exacerbation ([place]) ?Acute hypoxic respiratory failure ?Presented with wheezing and increased O2 requirement.  Started on IV steroids on admission, transitioned to prednisone. ?--Continue prednisone, continue tapering, currently on 20 ?-- s/p 5 days Zithromax 250 mg  ?--Duonebs and PRN albuterol nebs ?--Antitussives, mucolytics ?--Pulmonary hygiene ? ?Acute on chronic diastolic CHF (congestive heart failure) ([place]) ?Echo this admission on 4/22 technically limited study with preserved EF, but diastolic parameters indeterminate. ?Started on IV Lasix BID on admission. ?Edema improved ?- lasix on hold now 2/2 hypotension, AKI ?--Toprol-XL 100 mg -- prescribe at d/c if bp improves, currently bb on hold ?--Strict I/O's, daily weights ? ?Nodule of upper lobe of right lung ?17 mm spiculated nodule in the posterior right upper lobe. ?Associated 10.0 x 7.4 cm left mediastinal mass extending into the ?medial left upper lobe and left perihilar region. Appears malignant. Mri no signs mets. Bronch with biopsies 4/28, path pending ?-Pulmonary, Oncology, Palliative Care are consulted ?- outpt f/u dr. Janese Banks of oncology. PET ordered ? ?Obesity, Class III, BMI 40-49.9 (morbid obesity) ([place]) ?Body mass index is 40.53 kg/m?Marland Kitchen ?Complicates overall care and prognosis.  Recommend lifestyle modifications including physical activity and diet for weight loss and overall long-term health. ? ?Lupus  erythematosus ?Noted in chart review of [place] records. ?No apparent acute issues. ? ?History of renal carcinoma ?S/p right nephrectomy, now solitary  left kidney.  Monitor renal function and urine output closely. ? ?AKI (acute kidney injury) ([place]) ?Vs progression of ckd. Cr was 1 in 2020, here has held steady around 1.5, up to 2s in setting of hypotension. Improving today, cr 1.48 ?Avoid nephrotoxins, hypotension ?Maintain MAP>65 ?Renally dose meds ? ?Dyslipidemia ?Continue statin  ? ?Essential hypertension ?Stable off meds ? ? ?Subjective: breathing stable. No bm. No sig cough. No chest pain. Tolerating diet.  ? ?Physical Exam: ?Vitals:  ? 03/19/22 0400 03/19/22 0800 03/19/22 0812 03/19/22 1100  ?BP: 124/79 131/85  126/82  ?Pulse: 88 82  93  ?Resp: 17 17  20   ?Temp:  98.1 ?F (36.7 ?C)  98 ?F (36.7 ?C)  ?TempSrc:      ?SpO2: 99% 95% 97% 98%  ?Weight:      ?Height:      ? ?General exam: awake, alert, on bipap ?HEENT: moist mucus membranes, hearing grossly normal  ?Respiratory system: scattered rhonchi ?Cardiovascular system: normal S1/S2, RRR, resolved lower extremity edema.  ?Central nervous system: A&O x 4. no gross focal neurologic deficits, normal speech ?Extremities: trace edema ?Skin: dry, intact, cool fingers ?Psychiatry: calm, aaox3 ? ? ?Family Communication: sig other gwendolyn updated @ bedside 5/2. ? ?Disposition: ?Status is: Inpatient ?Remains inpatient appropriate because: monitoring post-procedure ? ? Planned Discharge Destination: Skilled nursing facility ? ?Time spent: 30 minutes ? ?Author: ?Desma Maxim, MD ?03/19/2022 12:37 PM ? ?For on call review [email or web address].  ?

## 2022-03-19 NOTE — Progress Notes (Signed)
Patient had large BM ?

## 2022-03-19 NOTE — [place] FL2 (Signed)
?[place] MEDICAID FL2 LEVEL OF CARE SCREENING TOOL  ?  ? ?IDENTIFICATION  ?Patient Name: ?Edwin Donovan. Birthdate: [birth year] Sex: male Admission Date (Current Location): ?03/09/2022  ?[place] and [place] Number: ? [place] ?  Facility and Address:  ?[facility], [address] [place], [place] [address] ?     Provider Number: ?[id number]  ?Attending Physician Name and Address:  ?Gwynne Edinger, MD ? Relative Name and Phone Number:  ?  ?   ?Current Level of Care: ?Hospital Recommended Level of Care: ?[place] Prior Approval Number: ?  ? ?Date Approved/Denied: ?  PASRR Number: ?[id number] ? ?Discharge Plan: ?SNF ?  ? ?Current Diagnoses: ?Patient Active Problem List  ? Diagnosis Date Noted  ? Obesity, Class III, BMI 40-49.9 (morbid obesity) ([place]) 03/12/2022  ? Palliative care encounter   ? History of renal carcinoma 03/10/2022  ? Prostate cancer ([place]) 03/10/2022  ? Lupus erythematosus 03/10/2022  ? Mediastinal mass   ? Acute pulmonary embolism ([place]) 03/09/2022  ? Nodule of upper lobe of right lung 03/09/2022  ? COPD with acute exacerbation ([place]) 03/09/2022  ? Essential hypertension 03/09/2022  ? Dyslipidemia 03/09/2022  ? AKI (acute kidney injury) ([place]) 03/09/2022  ? Acute on chronic diastolic CHF (congestive heart failure) ([place]) 03/09/2022  ? Urothelial carcinoma ([place]) 02/28/2019  ? History of prostate cancer 08/04/2018  ? Cervicalgia 12/25/2017  ? Spondylosis of cervical region without myelopathy or radiculopathy 12/25/2017  ? Primary osteoarthritis of both knees 12/25/2017  ? Lumbar degenerative disc disease 12/25/2017  ? Chronic pain syndrome 12/25/2017  ? COPD (chronic obstructive pulmonary disease) ([place]) 11/03/2017  ? Hypertension 11/03/2017  ? Insomnia 11/03/2017  ? Anxiety 11/03/2017  ? Sepsis ([place]) 06/28/2016  ? Right ureteral stone   ? ? ?Orientation RESPIRATION BLADDER Height & Weight   ?  ?Self, Time, Situation, Place ? Other (Comment), O2 (Weaning  oxygen down prior to discharge. Currently on HFNC 6 L. Previously on 3 L nasal cannula. Bipap QHS: 14/6 at 50%.) Incontinent, External catheter Weight: 280 lb 10.3 oz (127.3 kg) ?Height:  5\' 11"  (180.3 cm)  ?BEHAVIORAL SYMPTOMS/MOOD NEUROLOGICAL BOWEL NUTRITION STATUS  ? (None)  (None) Incontinent Diet (Heart healthy)  ?AMBULATORY STATUS COMMUNICATION OF NEEDS Skin   ?Extensive Assist Verbally Surgical wounds, Other (Comment) (Erythema/redness. Has incision on penis noted on 3/24 but no dressing listed.) ?  ?  ?  ?    ?     ?     ? ? ?Personal Care Assistance Level of Assistance  ?Bathing, Feeding, Dressing Bathing Assistance: Maximum assistance ?Feeding assistance: Limited assistance ?Dressing Assistance: Maximum assistance ?   ? ?Functional Limitations Info  ?Sight, Hearing, Speech Sight Info: Adequate ?Hearing Info: Adequate ?Speech Info: Adequate  ? ? ?SPECIAL CARE FACTORS FREQUENCY  ?PT (By licensed PT), OT (By licensed OT)   ?  ?PT Frequency: 5 x week ?OT Frequency: 5 x week ?  ?  ?  ?   ? ? ?Contractures Contractures Info: Not present  ? ? ?Additional Factors Info  ?Code Status, Allergies Code Status Info: DNR ?Allergies Info: NKDA ?  ?  ?  ?   ? ?Current Medications (03/19/2022):  This is the current hospital active medication list ?Current Facility-Administered Medications  ?Medication Dose Route Frequency Provider Last Rate Last Admin  ? acetaminophen (TYLENOL) tablet 650 mg  650 mg Oral Q6H PRN Mansy, Arvella Merles, MD   650 mg at 03/16/22 0548  ? Or  ? acetaminophen (TYLENOL)  suppository 650 mg  650 mg Rectal Q6H PRN Mansy, Jan A, MD      ? albuterol (PROVENTIL) (2.5 MG/3ML) 0.083% nebulizer solution 3 mL  3 mL Inhalation Q4H PRN Nicole Kindred A, DO   3 mL at 03/19/22 0809  ? apixaban (ELIQUIS) tablet 10 mg  10 mg Oral BID Wynelle Cleveland, RPH   10 mg at 03/19/22 1047  ? Followed by  ? [START ON 03/25/2022] apixaban (ELIQUIS) tablet 5 mg  5 mg Oral BID Wynelle Cleveland, RPH      ? atorvastatin (LIPITOR)  tablet 80 mg  80 mg Oral Daily Mansy, Jan A, MD   80 mg at 03/19/22 1048  ? bisacodyl (DULCOLAX) EC tablet 5 mg  5 mg Oral Daily PRN Nicole Kindred A, DO   5 mg at 03/14/22 1001  ? chlorhexidine (PERIDEX) 0.12 % solution 15 mL  15 mL Mouth Rinse BID Gwynne Edinger, MD   15 mL at 03/19/22 1048  ? Chlorhexidine Gluconate Cloth 2 % PADS 6 each  6 each Topical Q0600 Gwynne Edinger, MD   6 each at 03/18/22 0430  ? Glycerin (Adult) 2 g suppository 1 suppository  1 suppository Rectal Daily PRN Gwynne Edinger, MD   1 suppository at 03/18/22 1225  ? ipratropium-albuterol (DUONEB) 0.5-2.5 (3) MG/3ML nebulizer solution 3 mL  3 mL Nebulization Q6H PRN Ottie Glazier, MD   3 mL at 03/17/22 2127  ? MEDLINE mouth rinse  15 mL Mouth Rinse q12n4p Gwynne Edinger, MD   15 mL at 03/19/22 1221  ? polyethylene glycol (MIRALAX / GLYCOLAX) packet 17 g  17 g Oral Daily Nicole Kindred A, DO   17 g at 03/19/22 1048  ? [START ON 03/20/2022] predniSONE (DELTASONE) tablet 10 mg  10 mg Oral Q breakfast Ottie Glazier, MD      ? senna-docusate (Senokot-S) tablet 1 tablet  1 tablet Oral BID Nicole Kindred A, DO   1 tablet at 03/19/22 1048  ? sodium phosphate (FLEET) 7-19 GM/118ML enema 1 enema  1 enema Rectal Daily PRN Gwynne Edinger, MD   1 enema at 03/16/22 0439  ? tamsulosin (FLOMAX) capsule 0.4 mg  0.4 mg Oral QPC supper Gwynne Edinger, MD   0.4 mg at 03/18/22 1803  ? traZODone (DESYREL) tablet 200 mg  200 mg Oral QHS PRN Gwynne Edinger, MD   200 mg at 03/17/22 2105  ? ? ? ?Discharge Medications: ?Please see discharge summary for a list of discharge medications. ? ?Relevant Imaging Results: ? ?Relevant Lab Results: ? ? ?Additional Information ?SS#: [id number] ? ?Candie Chroman, LCSW ? ? ? ? ?

## 2022-03-19 NOTE — Progress Notes (Signed)
? ? ? ?PULMONOLOGY ? ? ? ? ? ? ? ? ?Date: 03/19/2022,   ?MRN# [id number] Edwin Donovan. 07-11-1946 ? ? ?  ?AdmissionWeight: (!) 141.5 kg                 ?CurrentWeight: 127.3 kg ? ?Referring provider: Dr. Arbutus Ped ? ? ?CHIEF COMPLAINT:  ? ?Nonmassive PE with RUL nodule and mediastinal lymphadenopathy ? ? ?HISTORY OF PRESENT ILLNESS  ? ?This is a pleasant 76 year old male with a history of prostate CA, disorder, arthritis, CHF, CKD COPD, dysrhythmia history of nephrolithiasis and essential previous episode of pneumonia in 2017, he reports worsening cough and wheezing.  He was found to be mildly tachypneic on arrival and required 3 L of supplemental oxygen to reach normoxia at SPO2 95%.  Labs showed CKD however troponins were essentially normal in the context of CKD with only mild elevation.  Influenza and COVID-19 are negative.  CTA was performed with findings of subsegmental pulmonary emboli as well as 17 mm spiculated nodule of the right upper lobe and associated mediastinal lymphadenopathy/mass.  He had nephrectomy in the past due to renal cell carcinoma.  He quit smoking 2 years ago. PCCM consultation was placed for additional evaluation and management.  ? ? ?03/17/22- patient is improved, he is off BIPAP. He is speaking and answering appropriately to verbal communication. Have dcd IVF and tessalon.  Have ordered recruitment maneuvers with metaneb. CT chest today to eval lung as suggested by previous radiology report.  ? ? ? ? ?03/18/22 - patient is improved from respiratory perspective, he is on 6L/min and is speaking full sentences. VBG repeat today. Will start on eliquis from heparin ?03/19/22- patient has biopsy results from bronchoscopy with +small cell lung cancer and N1 nodal metastasis. He is on 6L/min [place] with Acute PE currently on eliquis. He is slowly improving clinically and is appreciative of care. I met with his [place] doctor today and we briefly reviewed medical plan.  ? ?PAST MEDICAL HISTORY  ? ?Past  Medical History:  ?Diagnosis Date  ?? Anxiety   ? ptsd  ?? Arthritis   ?? Cancer [facility]   ? PROSTATE  ?? CHF (congestive heart failure) ([place])   ?? Chronic kidney disease   ? stones  ?? COPD (chronic obstructive pulmonary disease) ([place])   ?? Dysrhythmia   ?? History of kidney stones   ?? Hypertension   ?? Kidney stone   ?? Pneumonia   ? 06/27/16  ?? Shortness of breath dyspnea   ? ? ? ?SURGICAL HISTORY  ? ?Past Surgical History:  ?Procedure Laterality Date  ?? CYSTOSCOPY W/ RETROGRADES Bilateral 12/26/2017  ? Procedure: CYSTOSCOPY WITH RETROGRADE PYELOGRAM;  Surgeon: Abbie Sons, MD;  Location: [facility];  Service: Urology;  Laterality: Bilateral;  ?? CYSTOSCOPY W/ RETROGRADES Right 02/09/2019  ? Procedure: CYSTOSCOPY WITH RETROGRADE PYELOGRAM;  Surgeon: Abbie Sons, MD;  Location: [facility];  Service: Urology;  Laterality: Right;  ?? CYSTOSCOPY W/ URETERAL STENT PLACEMENT Right 07/29/2016  ? Procedure: CYSTOSCOPY WITH STENT REPLACEMENT;  Surgeon: Hollice Espy, MD;  Location: [facility];  Service: Urology;  Laterality: Right;  ?? CYSTOSCOPY WITH BIOPSY Right 07/29/2016  ? Procedure: CYSTOSCOPY WITH BIOPSY;  Surgeon: Hollice Espy, MD;  Location: [facility];  Service: Urology;  Laterality: Right;  ?? CYSTOSCOPY WITH BIOPSY Right 02/09/2019  ? Procedure: CYSTOSCOPY WITH BIOPSY;  Surgeon: Abbie Sons, MD;  Location: [facility];  Service: Urology;  Laterality: Right;  ?? CYSTOSCOPY WITH STENT PLACEMENT  Right 06/28/2016  ? Procedure: CYSTOSCOPY WITH STENT PLACEMENT;  Surgeon: Hollice Espy, MD;  Location: [facility];  Service: Urology;  Laterality: Right;  ?? CYSTOSCOPY WITH STENT PLACEMENT Right 02/09/2019  ? Procedure: CYSTOSCOPY WITH STENT PLACEMENT;  Surgeon: Abbie Sons, MD;  Location: [facility];  Service: Urology;  Laterality: Right;  ?? CYSTOSCOPY/URETEROSCOPY/HOLMIUM LASER/STENT PLACEMENT Right 12/26/2017  ? Procedure: CYSTOSCOPY/URETEROSCOPY/HOLMIUM LASER/STENT PLACEMENT;  Surgeon: Abbie Sons, MD;   Location: [facility];  Service: Urology;  Laterality: Right;  ?? EXTRACORPOREAL SHOCK WAVE LITHOTRIPSY Right 12/04/2017  ? Procedure: EXTRACORPOREAL SHOCK WAVE LITHOTRIPSY (ESWL);  Surgeon: Hollice Espy, MD;  Location: [facility];  Service: Urology;  Laterality: Right;  ?? PROSTATECTOMY    ?? URETEROSCOPY Right 07/29/2016  ? Procedure: URETEROSCOPY;  Surgeon: Hollice Espy, MD;  Location: [facility];  Service: Urology;  Laterality: Right;  ?? URETEROSCOPY Right 02/09/2019  ? Procedure: URETEROSCOPY- DIAGNOSTIC;  Surgeon: Abbie Sons, MD;  Location: [facility];  Service: Urology;  Laterality: Right;  ?? VIDEO BRONCHOSCOPY WITH ENDOBRONCHIAL NAVIGATION Left 03/15/2022  ? Procedure: VIDEO BRONCHOSCOPY WITH ENDOBRONCHIAL NAVIGATION;  Surgeon: Ottie Glazier, MD;  Location: [facility];  Service: Thoracic;  Laterality: Left;  ? ? ? ?FAMILY HISTORY  ? ?Family History  ?Problem Relation Age of Onset  ?? Stroke Mother   ?? Heart attack Father   ?? Lung cancer Father   ?? Kidney disease Father   ?? Prostate cancer Neg Hx   ?? Bladder Cancer Neg Hx   ? ? ? ?SOCIAL HISTORY  ? ?Social History  ? ?Tobacco Use  ?? Smoking status: Some Days  ?  Packs/day: 0.50  ?  Types: Cigarettes  ?? Smokeless tobacco: Never  ?Vaping Use  ?? Vaping Use: Never used  ?Substance Use Topics  ?? Alcohol use: Yes  ?  Alcohol/week: 14.0 standard drinks  ?  Types: 14 Cans of beer per week  ?? Drug use: Yes  ?  Types: Marijuana  ?  Comment: "sometimes, not very often"  ? ? ? ?MEDICATIONS  ? ? ?Home Medication:  ?Current Outpatient Rx  ?? Order #: [id number] Class: No Print  ?? Order #: [id number] Class: No Print  ?? Order #: [id number] Class: No Print  ?? Order #: [id number] Class: No Print  ? ?  ?Current Medication: ? ?Current Facility-Administered Medications:  ??  acetaminophen (TYLENOL) tablet 650 mg, 650 mg, Oral, Q6H PRN, 650 mg at 03/16/22 0548 **OR** acetaminophen (TYLENOL) suppository 650 mg, 650 mg, Rectal, Q6H PRN, Mansy, Jan A, MD ??  albuterol  (PROVENTIL) (2.5 MG/3ML) 0.083% nebulizer solution 3 mL, 3 mL, Inhalation, Q4H PRN, Nicole Kindred A, DO, 3 mL at 03/19/22 0809 ??  apixaban (ELIQUIS) tablet 10 mg, 10 mg, Oral, BID, 10 mg at 03/18/22 2128 **FOLLOWED BY** [START ON 03/25/2022] apixaban (ELIQUIS) tablet 5 mg, 5 mg, Oral, BID, Wynelle Cleveland, RPH ??  atorvastatin (LIPITOR) tablet 80 mg, 80 mg, Oral, Daily, Mansy, Jan A, MD, 80 mg at 03/18/22 0954 ??  bisacodyl (DULCOLAX) EC tablet 5 mg, 5 mg, Oral, Daily PRN, Nicole Kindred A, DO, 5 mg at 03/14/22 1001 ??  chlorhexidine (PERIDEX) 0.12 % solution 15 mL, 15 mL, Mouth Rinse, BID, Wouk, Ailene Rud, MD, 15 mL at 03/18/22 2128 ??  Chlorhexidine Gluconate Cloth 2 % PADS 6 each, 6 each, Topical, Q0600, Wouk, Ailene Rud, MD, 6 each at 03/18/22 0430 ??  Glycerin (Adult) 2 g suppository 1 suppository, 1 suppository, Rectal, Daily PRN, Si Raider, Ailene Rud, MD, 1 suppository at 03/18/22 1225 ??  ipratropium-albuterol (DUONEB) 0.5-2.5 (3) MG/3ML nebulizer solution 3 mL, 3 mL, Nebulization, Q6H PRN, Ottie Glazier, MD, 3 mL at 03/17/22 2127 ??  MEDLINE mouth rinse, 15 mL, Mouth Rinse, q12n4p, Wouk, Ailene Rud, MD, 15 mL at 03/18/22 1803 ??  polyethylene glycol (MIRALAX / GLYCOLAX) packet 17 g, 17 g, Oral, Daily, Nicole Kindred A, DO, 17 g at 03/18/22 0954 ??  predniSONE (DELTASONE) tablet 20 mg, 20 mg, Oral, Q breakfast, Wouk, Ailene Rud, MD, 20 mg at 03/18/22 0954 ??  senna-docusate (Senokot-S) tablet 1 tablet, 1 tablet, Oral, BID, Nicole Kindred A, DO, 1 tablet at 03/18/22 2132 ??  sodium phosphate (FLEET) 7-19 GM/118ML enema 1 enema, 1 enema, Rectal, Daily PRN, Si Raider, Ailene Rud, MD, 1 enema at 03/16/22 0439 ??  tamsulosin (FLOMAX) capsule 0.4 mg, 0.4 mg, Oral, QPC supper, Wouk, Ailene Rud, MD, 0.4 mg at 03/18/22 1803 ??  traZODone (DESYREL) tablet 200 mg, 200 mg, Oral, QHS PRN, Si Raider, Ailene Rud, MD, 200 mg at 03/17/22 2105 ? ? ? ?ALLERGIES  ? ?Patient has no known allergies. ? ? ? ? ?REVIEW OF  SYSTEMS  ? ? ?Review of Systems: ? ?Gen:  Denies  fever, sweats, chills weigh loss  ?HEENT: Denies blurred vision, double vision, ear pain, eye pain, hearing loss, nose bleeds, sore throat ?Cardiac:  No dizziness, che

## 2022-03-19 NOTE — Evaluation (Signed)
Physical Therapy RE-Evaluation ?Patient Details ?Name: Edwin Donovan. ?MRN: [id number] ?DOB: 10/03/1946 ?Today's Date: 03/19/2022 ? ?History of Present Illness ? Pt is a 76 year old male presenting to the hospital  with progressively worsening lower extremity edema, significant orthopnea, dyspnea worsening on exertion over the last couple weeks, in addition to worsening cough with wheezing.  Chest x-Joseh Sjogren reveals large masslike opacity in the left hilumn chest CTA reveals two isolated subsegmental pulmonary emboli at the bifurcation in the right upper lobe pulmonary artery. Overall clot burden is very small.     17 mm spiculated nodule in the posterior right upper lobe.  Associated 10.0 x 7.4 cm left mediastinal mass extending into the medial left upper lobe and left perihilar region. This appearance raises concern for primary bronchogenic neoplasm such as small cell lung cancer.PMH significant for COPD not on home oxygen prior to recent hospital d/c on 2/97/98, chronic diastolic CHF, hypertension and CKD stage II with hx or Right nephrectomy. Hospital stay complicated by+ PE and transfer to CCU due to decreased respiratory status. Re-evaluation consult received this date.  ?Clinical Impression ? Pt is a pleasant 76 year old male who is reevaluated this date after change in status requiring CCU stay due to respiratory status. Pt unable to perform OOB mobility efforts this date due to respiratory demands, however is agreeable to supine there-ex. Pt demonstrates deficits with weakness/endurance/mobility. O2 sats on 5L of O2 quickly desat to 89% with any movement, respond fairly quickly to rest and PLB. Increased RR throughout session. Would benefit from skilled PT to address above deficits and promote optimal return to PLOF; recommend transition to STR upon discharge from acute hospitalization. ?  ?   ? ?Recommendations for follow up therapy are one component of a multi-disciplinary discharge planning process, led  by the attending physician.  Recommendations may be updated based on patient status, additional functional criteria and insurance authorization. ? ?Follow Up Recommendations Skilled nursing-short term rehab (<3 hours/day) ? ?  ?Assistance Recommended at Discharge Frequent or constant Supervision/Assistance  ?Patient can return home with the following ? Two people to help with walking and/or transfers;Two people to help with bathing/dressing/bathroom ? ?  ?Equipment Recommendations None recommended by PT  ?Recommendations for Other Services ?    ?  ?Functional Status Assessment Patient has had a recent decline in their functional status and demonstrates the ability to make significant improvements in function in a reasonable and predictable amount of time.  ? ?  ?Precautions / Restrictions Precautions ?Precautions: Fall ?Restrictions ?Weight Bearing Restrictions: No  ? ?  ? ?Mobility ? Bed Mobility ?  ?  ?  ?  ?  ?  ?  ?General bed mobility comments: not performed this date due to increased RR and decreased endurance ?  ? ?Transfers ?  ?  ?  ?  ?  ?  ?  ?  ?  ?  ?  ? ?Ambulation/Gait ?  ?  ?  ?  ?  ?  ?  ?  ? ?Stairs ?  ?  ?  ?  ?  ? ?Wheelchair Mobility ?  ? ?Modified Rankin (Stroke Patients Only) ?  ? ?  ? ?Balance   ?  ?  ?  ?  ?  ?  ?  ?  ?  ?  ?  ?  ?  ?  ?  ?  ?  ?  ?   ? ? ? ?Pertinent Vitals/Pain Pain Assessment ?Pain Assessment:  No/denies pain  ? ? ?Home Living Family/patient expects to be discharged to:: Private residence ?Living Arrangements: Alone ?Available Help at Discharge: Personal care attendant ?Type of Home: House ?Home Access: Ramped entrance ?  ?  ?  ?Home Layout: Two level;Able to live on main level with bedroom/bathroom ?Home Equipment: Kasandra Knudsen - single point;Shower seat;Wheelchair - power;BSC/3in1;Electric scooter;Grab bars - toilet;Grab bars - tub/shower;Hospital bed ?Additional Comments: reports all DME is readily available in his living room.  ?  ?Prior Function Prior Level of Function : Needs  assist ?  ?  ?  ?  ?  ?  ?Mobility Comments: 2-3 months ago amb with SPC/RW short household distances now reports mainly sitting in lift chair during the day, sleeps in recliner but reports he will sleep in new hospital bed recently delivered. Reports multiple falls recently ?ADLs Comments: aid that assist 7 days per week for 2 hrs a day through the [place], has hired her for more hours at time; has assist for all IADLs, assist required for bathing and dressing; pt sink bathes ?  ? ? ?Hand Dominance  ?   ? ?  ?Extremity/Trunk Assessment  ? Upper Extremity Assessment ?Upper Extremity Assessment: Generalized weakness (B UE grossly 3+/5) ?  ? ?Lower Extremity Assessment ?Lower Extremity Assessment: Generalized weakness (B LE grossly 3/5) ?  ? ?   ?Communication  ? Communication: No difficulties  ?Cognition Arousal/Alertness: Awake/alert ?Behavior During Therapy: [facility] for tasks assessed/performed ?Overall Cognitive Status: Within Functional Limits for tasks assessed ?  ?  ?  ?  ?  ?  ?  ?  ?  ?  ?  ?  ?  ?  ?  ?  ?General Comments: soft spoken, agreeable to limited session ?  ?  ? ?  ?General Comments   ? ?  ?Exercises Other Exercises ?Other Exercises: supine ther-ex performed on B LE including AP, quad sets, hip abd/add, SLRs, SAQs, and scap squeezes. 10 reps performed. min assist performed. O2 sats maintained 88-92% on 5L with exertion with increased RR.  ? ?Assessment/Plan  ?  ?PT Assessment Patient needs continued PT services  ?PT Problem List Decreased strength;Decreased activity tolerance;Decreased balance;Decreased mobility;Cardiopulmonary status limiting activity;Obesity ? ?   ?  ?PT Treatment Interventions DME instruction;Functional mobility training;Therapeutic activities;Balance training;Therapeutic exercise;Neuromuscular re-education;Patient/family education;Wheelchair mobility training   ? ?PT Goals (Current goals can be found in the Care Plan section)  ?Acute Rehab PT Goals ?Patient Stated Goal: to get stronger  first ?PT Goal Formulation: With patient ?Time For Goal Achievement: 04/02/22 ?Potential to Achieve Goals: Fair ? ?  ?Frequency Min 2X/week ?  ? ? ?Co-evaluation   ?  ?  ?  ?  ? ? ?  ?AM-PAC PT "6 Clicks" Mobility  ?Outcome Measure Help needed turning from your back to your side while in a flat bed without using bedrails?: A Lot ?Help needed moving from lying on your back to sitting on the side of a flat bed without using bedrails?: A Lot ?Help needed moving to and from a bed to a chair (including a wheelchair)?: Total ?Help needed standing up from a chair using your arms (e.g., wheelchair or bedside chair)?: Total ?Help needed to walk in hospital room?: Total ?Help needed climbing 3-5 steps with a railing? : Total ?6 Click Score: 8 ? ?  ?End of Session Equipment Utilized During Treatment: Oxygen ?Activity Tolerance: Patient limited by fatigue ?Patient left: in bed;with bed alarm set ?Nurse Communication: Mobility status ?PT Visit Diagnosis: Unsteadiness on  feet (R26.81);Muscle weakness (generalized) (M62.81);History of falling (Z91.81);Difficulty in walking, not elsewhere classified (R26.2) ?  ? ?Time: 1457-1510 ?PT Time Calculation (min) (ACUTE ONLY): 13 min ? ? ?Charges:   PT Evaluation ?$PT Re-evaluation: 1 Re-eval ?  ?  ?   ? ? ?Greggory Stallion, PT, DPT, GCS ?[phone] ? ? ?Edwin Donovan ?03/19/2022, 4:18 PM ? ?

## 2022-03-20 ENCOUNTER — Ambulatory Visit: Payer: Medicare Other | Admitting: Radiation Oncology

## 2022-03-20 DIAGNOSIS — I2699 Other pulmonary embolism without acute cor pulmonale: Secondary | ICD-10-CM | POA: Diagnosis not present

## 2022-03-20 DIAGNOSIS — Z515 Encounter for palliative care: Secondary | ICD-10-CM | POA: Diagnosis not present

## 2022-03-20 DIAGNOSIS — J441 Chronic obstructive pulmonary disease with (acute) exacerbation: Secondary | ICD-10-CM | POA: Diagnosis not present

## 2022-03-20 DIAGNOSIS — I5033 Acute on chronic diastolic (congestive) heart failure: Secondary | ICD-10-CM | POA: Diagnosis not present

## 2022-03-20 DIAGNOSIS — N1831 Chronic kidney disease, stage 3a: Secondary | ICD-10-CM

## 2022-03-20 LAB — CBC
HCT: 38.5 % — ABNORMAL LOW (ref 39.0–52.0)
Hemoglobin: 12 g/dL — ABNORMAL LOW (ref 13.0–17.0)
MCH: 30.4 pg (ref 26.0–34.0)
MCHC: 31.2 g/dL (ref 30.0–36.0)
MCV: 97.5 fL (ref 80.0–100.0)
Platelets: 148 10*3/uL — ABNORMAL LOW (ref 150–400)
RBC: 3.95 MIL/uL — ABNORMAL LOW (ref 4.22–5.81)
RDW: 14.3 % (ref 11.5–15.5)
WBC: 8.5 10*3/uL (ref 4.0–10.5)
nRBC: 0 % (ref 0.0–0.2)

## 2022-03-20 LAB — BASIC METABOLIC PANEL
Anion gap: 6 (ref 5–15)
BUN: 52 mg/dL — ABNORMAL HIGH (ref 8–23)
CO2: 30 mmol/L (ref 22–32)
Calcium: 8.9 mg/dL (ref 8.9–10.3)
Chloride: 103 mmol/L (ref 98–111)
Creatinine, Ser: 1.46 mg/dL — ABNORMAL HIGH (ref 0.61–1.24)
GFR, Estimated: 49 mL/min — ABNORMAL LOW (ref >60)
Glucose, Bld: 103 mg/dL — ABNORMAL HIGH (ref 70–99)
Potassium: 4.6 mmol/L (ref 3.5–5.1)
Sodium: 139 mmol/L (ref 135–145)

## 2022-03-20 LAB — PROCALCITONIN: Procalcitonin: <0.1 ng/mL

## 2022-03-20 MED ORDER — PREDNISONE 10 MG PO TABS
5.0000 mg | ORAL_TABLET | Freq: Every day | ORAL | Status: DC
  Administered 2022-03-21 – 2022-03-26 (×6): 5 mg via ORAL
  Filled 2022-03-20 (×7): qty 1

## 2022-03-20 NOTE — Progress Notes (Signed)
? ?  ?Palliative Medicine ?[place] [facility] ?Telephone:([phone] Fax:([phone]) [phone] ? ? ?Name: Edwin Donovan. ?Date: 03/20/2022 ?MRN: [id number]  ?DOB: 1946/08/15 ? ?Patient Care Team: ?System, Provider Not In as PCP - General ?Telford Nab, RN as Oncology Nurse Navigator  ? ? ?REASON FOR CONSULTATION: ?Edwin Donovan. is a 76 y.o. male with multiple medical problems including diastolic dysfunction with history of CHF, hypertension, CKD, and COPD on 3 L O2 for the past several weeks, history of prostate and urothelial cancer status post surgery, who was admitted to the hospital 03/09/2022 for management of acute PE after presenting with shortness of breath.  CTA of the chest showed 2 small subsegmental PEs the patient also found to have a 10 cm left mediastinal mass concerning for primary bronchogenic cancer.  Biopsy positive for small cell lung cancer.  Palliative care was consulted to help address goals..  ? ?CODE STATUS: DNR ? ?PAST MEDICAL HISTORY: ?Past Medical History:  ?Diagnosis Date  ? Anxiety   ? ptsd  ? Arthritis   ? Cancer [facility])   ? PROSTATE  ? CHF (congestive heart failure) ([place])   ? Chronic kidney disease   ? stones  ? COPD (chronic obstructive pulmonary disease) ([place])   ? Dysrhythmia   ? History of kidney stones   ? Hypertension   ? Kidney stone   ? Pneumonia   ? 06/27/16  ? Shortness of breath dyspnea   ? ? ?PAST SURGICAL HISTORY:  ?Past Surgical History:  ?Procedure Laterality Date  ? CYSTOSCOPY W/ RETROGRADES Bilateral 12/26/2017  ? Procedure: CYSTOSCOPY WITH RETROGRADE PYELOGRAM;  Surgeon: Abbie Sons, MD;  Location: [facility];  Service: Urology;  Laterality: Bilateral;  ? CYSTOSCOPY W/ RETROGRADES Right 02/09/2019  ? Procedure: CYSTOSCOPY WITH RETROGRADE PYELOGRAM;  Surgeon: Abbie Sons, MD;  Location: [facility];  Service: Urology;  Laterality: Right;  ? CYSTOSCOPY W/ URETERAL STENT PLACEMENT Right 07/29/2016  ? Procedure: CYSTOSCOPY WITH STENT  REPLACEMENT;  Surgeon: Hollice Espy, MD;  Location: [facility];  Service: Urology;  Laterality: Right;  ? CYSTOSCOPY WITH BIOPSY Right 07/29/2016  ? Procedure: CYSTOSCOPY WITH BIOPSY;  Surgeon: Hollice Espy, MD;  Location: [facility];  Service: Urology;  Laterality: Right;  ? CYSTOSCOPY WITH BIOPSY Right 02/09/2019  ? Procedure: CYSTOSCOPY WITH BIOPSY;  Surgeon: Abbie Sons, MD;  Location: [facility];  Service: Urology;  Laterality: Right;  ? CYSTOSCOPY WITH STENT PLACEMENT Right 06/28/2016  ? Procedure: CYSTOSCOPY WITH STENT PLACEMENT;  Surgeon: Hollice Espy, MD;  Location: [facility];  Service: Urology;  Laterality: Right;  ? CYSTOSCOPY WITH STENT PLACEMENT Right 02/09/2019  ? Procedure: CYSTOSCOPY WITH STENT PLACEMENT;  Surgeon: Abbie Sons, MD;  Location: [facility];  Service: Urology;  Laterality: Right;  ? CYSTOSCOPY/URETEROSCOPY/HOLMIUM LASER/STENT PLACEMENT Right 12/26/2017  ? Procedure: CYSTOSCOPY/URETEROSCOPY/HOLMIUM LASER/STENT PLACEMENT;  Surgeon: Abbie Sons, MD;  Location: [facility];  Service: Urology;  Laterality: Right;  ? EXTRACORPOREAL SHOCK WAVE LITHOTRIPSY Right 12/04/2017  ? Procedure: EXTRACORPOREAL SHOCK WAVE LITHOTRIPSY (ESWL);  Surgeon: Hollice Espy, MD;  Location: [facility];  Service: Urology;  Laterality: Right;  ? PROSTATECTOMY    ? URETEROSCOPY Right 07/29/2016  ? Procedure: URETEROSCOPY;  Surgeon: Hollice Espy, MD;  Location: [facility];  Service: Urology;  Laterality: Right;  ? URETEROSCOPY Right 02/09/2019  ? Procedure: URETEROSCOPY- DIAGNOSTIC;  Surgeon: Abbie Sons, MD;  Location: [facility];  Service: Urology;  Laterality: Right;  ? VIDEO BRONCHOSCOPY WITH ENDOBRONCHIAL NAVIGATION Left  03/15/2022  ? Procedure: VIDEO BRONCHOSCOPY WITH ENDOBRONCHIAL NAVIGATION;  Surgeon: Ottie Glazier, MD;  Location: [facility];  Service: Thoracic;  Laterality: Left;  ? ? ?HEMATOLOGY/ONCOLOGY HISTORY:  ?Oncology History  ?Urothelial carcinoma ([place])  ?02/28/2019 Initial Diagnosis  ? Urothelial  carcinoma ([place]) ? ?  ?03/12/2019 -  Chemotherapy  ? The patient had palonosetron (ALOXI) injection 0.25 mg, 0.25 mg, Intravenous,  Once, 0 of 4 cycles ?CISplatin (PLATINOL) 176 mg in sodium chloride 0.9 % 500 mL chemo infusion, 70 mg/m2 = 176 mg, Intravenous,  Once, 0 of 4 cycles ?gemcitabine (GEMZAR) 2,508 mg in sodium chloride 0.9 % 250 mL chemo infusion, 1,000 mg/m2 = 2,508 mg, Intravenous,  Once, 0 of 4 cycles ?fosaprepitant (EMEND) 150 mg, dexamethasone (DECADRON) 12 mg in sodium chloride 0.9 % 145 mL IVPB, , Intravenous,  Once, 0 of 4 cycles ? ? for chemotherapy treatment.  ? ?  ? ? ?ALLERGIES:  has No Known Allergies. ? ?MEDICATIONS:  ?Current Facility-Administered Medications  ?Medication Dose Route Frequency Provider Last Rate Last Admin  ? acetaminophen (TYLENOL) tablet 650 mg  650 mg Oral Q6H PRN Mansy, Arvella Merles, MD   650 mg at 03/16/22 0548  ? Or  ? acetaminophen (TYLENOL) suppository 650 mg  650 mg Rectal Q6H PRN Mansy, Jan A, MD      ? albuterol (PROVENTIL) (2.5 MG/3ML) 0.083% nebulizer solution 3 mL  3 mL Inhalation Q4H PRN Nicole Kindred A, DO   3 mL at 03/19/22 0809  ? apixaban (ELIQUIS) tablet 10 mg  10 mg Oral BID Wynelle Cleveland, RPH   10 mg at 03/20/22 2297  ? Followed by  ? [START ON 03/25/2022] apixaban (ELIQUIS) tablet 5 mg  5 mg Oral BID Wynelle Cleveland, RPH      ? atorvastatin (LIPITOR) tablet 80 mg  80 mg Oral Daily Mansy, Jan A, MD   80 mg at 03/20/22 0950  ? bisacodyl (DULCOLAX) EC tablet 5 mg  5 mg Oral Daily PRN Nicole Kindred A, DO   5 mg at 03/14/22 1001  ? chlorhexidine (PERIDEX) 0.12 % solution 15 mL  15 mL Mouth Rinse BID Gwynne Edinger, MD   15 mL at 03/20/22 0951  ? Chlorhexidine Gluconate Cloth 2 % PADS 6 each  6 each Topical Q0600 Gwynne Edinger, MD   6 each at 03/20/22 [phone]  ? Glycerin (Adult) 2 g suppository 1 suppository  1 suppository Rectal Daily PRN Gwynne Edinger, MD   1 suppository at 03/18/22 1225  ? ipratropium-albuterol (DUONEB) 0.5-2.5 (3) MG/3ML  nebulizer solution 3 mL  3 mL Nebulization Q6H PRN Ottie Glazier, MD   3 mL at 03/17/22 2127  ? MEDLINE mouth rinse  15 mL Mouth Rinse q12n4p Gwynne Edinger, MD   15 mL at 03/19/22 1653  ? polyethylene glycol (MIRALAX / GLYCOLAX) packet 17 g  17 g Oral Daily Nicole Kindred A, DO   17 g at 03/20/22 0950  ? predniSONE (DELTASONE) tablet 10 mg  10 mg Oral Q breakfast Ottie Glazier, MD   10 mg at 03/20/22 0950  ? senna-docusate (Senokot-S) tablet 1 tablet  1 tablet Oral BID Nicole Kindred A, DO   1 tablet at 03/20/22 0950  ? sodium phosphate (FLEET) 7-19 GM/118ML enema 1 enema  1 enema Rectal Daily PRN Gwynne Edinger, MD   1 enema at 03/16/22 0439  ? tamsulosin (FLOMAX) capsule 0.4 mg  0.4 mg Oral QPC supper Wouk, Ailene Rud, MD   0.4  mg at 03/18/22 1803  ? traZODone (DESYREL) tablet 200 mg  200 mg Oral QHS PRN Gwynne Edinger, MD   200 mg at 03/19/22 2203  ? ? ?VITAL SIGNS: ?BP 129/80 (BP Location: Right Arm)   Pulse 94   Temp 98.1 ?F (36.7 ?C)   Resp 18   Ht 5\' 11"  (1.803 m)   Wt 282 lb 6.6 oz (128.1 kg)   SpO2 92%   BMI 39.39 kg/m?  ?Filed Weights  ? 03/18/22 0500 03/19/22 0309 03/20/22 0100  ?Weight: 294 lb 12.1 oz (133.7 kg) 280 lb 10.3 oz (127.3 kg) 282 lb 6.6 oz (128.1 kg)  ?  ?Estimated body mass index is 39.39 kg/m? as calculated from the following: ?  Height as of this encounter: 5\' 11"  (1.803 m). ?  Weight as of this encounter: 282 lb 6.6 oz (128.1 kg). ? ?LABS: ?CBC: ?   ?Component Value Date/Time  ? WBC 8.5 03/20/2022 0611  ? HGB 12.0 (L) 03/20/2022 1601  ? HCT 38.5 (L) 03/20/2022 0932  ? PLT 148 (L) 03/20/2022 3557  ? MCV 97.5 03/20/2022 0611  ? NEUTROABS 6.4 02/27/2022 0023  ? LYMPHSABS 0.8 02/27/2022 0023  ? MONOABS 0.5 02/27/2022 0023  ? EOSABS 0.2 02/27/2022 0023  ? BASOSABS 0.0 02/27/2022 0023  ? ?Comprehensive Metabolic Panel: ?   ?Component Value Date/Time  ? NA 139 03/20/2022 0611  ? K 4.6 03/20/2022 0611  ? CL 103 03/20/2022 0611  ? CO2 30 03/20/2022 0611  ? BUN 52 (H)  03/20/2022 3220  ? BUN 18 10/21/2016 1614  ? CREATININE 1.46 (H) 03/20/2022 2542  ? GLUCOSE 103 (H) 03/20/2022 7062  ? CALCIUM 8.9 03/20/2022 0611  ? AST 17 03/09/2022 1258  ? ALT 15 03/09/2022 1258  ? ALKPHOS 62 04/22

## 2022-03-20 NOTE — Care Management Important Message (Signed)
Important Message ? ?Patient Details  ?Name: Edwin Donovan. ?MRN: [id number] ?Date of Birth: 1946-01-03 ? ? ?Medicare Important Message Given:  Yes ? ? ? ? ?Juliann Pulse A Ercie Eliasen ?03/20/2022, 11:09 AM ?

## 2022-03-20 NOTE — Evaluation (Signed)
Occupational Therapy Evaluation ?Patient Details ?Name: Edwin Donovan. ?MRN: [id number] ?DOB: [birth year] ?Today's Date: 03/20/2022 ? ? ?History of Present Illness Pt is a 76 year old male presenting to the hospital  with progressively worsening lower extremity edema, significant orthopnea, dyspnea worsening on exertion over the last couple weeks, in addition to worsening cough with wheezing.  Chest x-ray reveals large masslike opacity in the left hilumn chest CTA reveals two isolated subsegmental pulmonary emboli at the bifurcation in the right upper lobe pulmonary artery. Overall clot burden is very small.     17 mm spiculated nodule in the posterior right upper lobe.  Associated 10.0 x 7.4 cm left mediastinal mass extending into the medial left upper lobe and left perihilar region. This appearance raises concern for primary bronchogenic neoplasm such as small cell lung cancer.PMH significant for COPD not on home oxygen prior to recent hospital d/c on 1/51/76, chronic diastolic CHF, hypertension and CKD stage II with hx or Right nephrectomy. Hospital stay complicated by+ PE and transfer to CCU due to decreased respiratory status. Re-evaluation consult received.  ? ?Clinical Impression ?  ?Chart reviewed to date, pt greeted in bed agreeable to OT re-evaluation however declining OOB mobility due to fatigue. Pt spo2 >90% on 6 L via HFNC. Pt agreeable to bed mobility in preparation for lunch as lunch tray had arrived. Pt requires MIN A for multiple bouts of rolling L<>R for sheet change and peri care. Pt required TOTAL A For peri care following BM. Breakdown noted on R sacral area, RN present to assess. Pt requires TOTAL A +2 for boost up bed.Pt sitting upright and appropriately in bed, requests PO intake of iced tea, pt with noted wet cough following intake. RN aware. Pt at 86% on 7L via HFNC at end of tx session. RN present to place pt on bipap. Pt educated on importance of positional changes to present further  break down. OT will continue to follow while admitted  ?   ? ?Recommendations for follow up therapy are one component of a multi-disciplinary discharge planning process, led by the attending physician.  Recommendations may be updated based on patient status, additional functional criteria and insurance authorization.  ? ?Follow Up Recommendations ? Skilled nursing-short term rehab (<3 hours/day)  ?  ?Assistance Recommended at Discharge Frequent or constant Supervision/Assistance  ?Patient can return home with the following Help with stairs or ramp for entrance;Assist for transportation;Assistance with cooking/housework;Two people to help with bathing/dressing/bathroom;Two people to help with walking and/or transfers;Direct supervision/assist for medications management;Assistance with feeding ? ?  ?Functional Status Assessment ? Patient has had a recent decline in their functional status and demonstrates the ability to make significant improvements in function in a reasonable and predictable amount of time.  ?Equipment Recommendations ? None recommended by OT  ?  ?Recommendations for Other Services Speech consult ? ? ?  ?Precautions / Restrictions Precautions ?Precautions: Fall ?Restrictions ?Weight Bearing Restrictions: No  ? ?  ? ?Mobility Bed Mobility ?Overal bed mobility: Needs Assistance ?Bed Mobility: Rolling ?Rolling: Min assist (multiple rolls to adjust sheets, for peri care with MIN A with use of bed rails) ?  ?  ?  ?  ?General bed mobility comments: further bed mobility deferred due to vital signs, decreased spo2 ?  ? ?Transfers ?  ?  ?  ?  ?  ?  ?  ?  ?  ?  ?  ? ?  ?Balance   ?  ?  ?  ?  ?  ?  ?  ?  ?  ?  ?  ?  ?  ?  ?  ?  ?  ?  ?   ? ?  ADL either performed or assessed with clinical judgement  ? ?ADL Overall ADL's : Needs assistance/impaired ?Eating/Feeding: Set up;Bed level ?  ?Grooming: Wash/dry hands;Wash/dry face;Set up;Bed level ?  ?Upper Body Bathing: Maximal assistance;Bed level ?  ?Lower Body  Bathing: Total assistance;Bed level ?  ?Upper Body Dressing : Maximal assistance;Bed level ?  ?Lower Body Dressing: Total assistance;Bed level ?  ?  ?Toilet Transfer Details (indicate cue type and reason): deferred pt desating to 80s with bed mobility ?Toileting- Clothing Manipulation and Hygiene: Total assistance;Bed level ?Toileting - Clothing Manipulation Details (indicate cue type and reason): peri care after BM ?  ?  ?  ?   ? ? ? ?Vision Baseline Vision/History: 1 Wears glasses ?Patient Visual Report: No change from baseline ?   ?   ?Perception   ?  ?Praxis   ?  ? ?Pertinent Vitals/Pain Pain Assessment ?Pain Assessment: Faces ?Faces Pain Scale: Hurts little more ?Pain Location: generalized ?Pain Descriptors / Indicators: Grimacing ?Pain Intervention(s): Limited activity within patient's tolerance, Monitored during session, Repositioned  ? ? ? ?Hand Dominance   ?  ?Extremity/Trunk Assessment Upper Extremity Assessment ?Upper Extremity Assessment: Generalized weakness (BUE grossly 3+/5, pt able to bring wash cloth to face with R hand) ?  ?Lower Extremity Assessment ?Lower Extremity Assessment: Generalized weakness ?  ?  ?  ?Communication Communication ?Communication: No difficulties ?  ?Cognition Arousal/Alertness: Awake/alert ?Behavior During Therapy: [facility] for tasks assessed/performed ?Overall Cognitive Status: Within Functional Limits for tasks assessed ?  ?  ?  ?  ?  ?  ?  ?  ?  ?  ?  ?  ?  ?  ?  ?  ?General Comments: dysphonic ?  ?  ?General Comments  Pt noted with break down on R sacral area, RN notified and applied mepilex; Pt with desats with lowest noted to 80% on 7L via HFNC with rolling, RN aware and present. ? ?  ?Exercises Other Exercises ?Other Exercises: edu re: skin integrity, mobility within current level of function; ?  ?Shoulder Instructions    ? ? ?Home Living Family/patient expects to be discharged to:: Private residence ?Living Arrangements: Alone ?Available Help at Discharge: Personal care  attendant ?Type of Home: House ?Home Access: Ramped entrance ?  ?  ?Home Layout: Two level;Able to live on main level with bedroom/bathroom ?  ?  ?  ?  ?  ?Bathroom Accessibility: Yes ?How Accessible: Accessible via walker (not accessible via walker or cane) ?Home Equipment: Kasandra Knudsen - single point;Shower seat;Wheelchair - power;BSC/3in1;Electric scooter;Grab bars - toilet;Grab bars - tub/shower;Hospital bed (group 3 pwc with ELR, recline, tilt) ?  ?Additional Comments: reports all DME is readily available in his living room. ?  ? ?  ?Prior Functioning/Environment Prior Level of Function : Needs assist ?  ?  ?  ?  ?  ?  ?Mobility Comments: 2-3 months ago amb with SPC/RW short household distances now reports mainly sitting in lift chair during the day, sleeps in recliner but reports he will sleep in new hospital bed recently delivered ?ADLs Comments: aid that assist 7 days per week for 2 hrs a day through the [place], has hired her for more hours at time; has assist for all IADLs, assist required for bathing and dressing; pt sink bathes ?  ? ?  ?  ?OT Problem List: Decreased strength;Decreased activity tolerance;Impaired balance (sitting and/or standing);Decreased knowledge of use of DME or AE;Cardiopulmonary status limiting activity;Decreased safety awareness ?  ?   ?OT Treatment/Interventions:    ?  ?  OT Goals(Current goals can be found in the care plan section) Acute Rehab OT Goals ?Patient Stated Goal: get stronger ?OT Goal Formulation: With patient ?Time For Goal Achievement: 04/03/22 ?Potential to Achieve Goals: Good  ?OT Frequency:   ?  ? ?Co-evaluation   ?  ?  ?  ?  ? ?  ?AM-PAC OT "6 Clicks" Daily Activity     ?Outcome Measure Help from another person eating meals?: A Little ?Help from another person taking care of personal grooming?: A Lot ?Help from another person toileting, which includes using toliet, bedpan, or urinal?: Total ?Help from another person bathing (including washing, rinsing, drying)?: A Lot ?Help  from another person to put on and taking off regular upper body clothing?: A Lot ?Help from another person to put on and taking off regular lower body clothing?: Total ?6 Click Score: 11 ?  ?End of Session Equipm

## 2022-03-20 NOTE — Consult Note (Signed)
?NEW PATIENT EVALUATION ? ?Name: Edwin Donovan.  ?MRN: [id number]  ?Date:   03/08/2022     ?DOB: 09-09-1946 ? ? ?This 76 y.o. 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 ? ?REFERRING PHYSICIAN: No ref. provider found ? ?CHIEF COMPLAINT:  ?Chief Complaint  ?Patient presents with  ? Shortness of Breath  ? ? ?DIAGNOSIS: The primary encounter diagnosis was Shortness of breath. Diagnoses of COPD exacerbation ([place]), Other acute pulmonary embolism without acute cor pulmonale ([place]), and Lung mass were also pertinent to this visit. ?  ?PREVIOUS INVESTIGATIONS:  ?CT scans reviewed PET CT scan has been ordered ?Pathology reports reviewed ?Clinical notes reviewed ? ?HPI: Patient is a 76 year old male who presented with increasing exertional shortness of breath and fatigue.  He was admitted to [facility] he was found to have an 11 x 7.6 x 10 cm mass in centered in the perihilar aspect of the left lung predominantly in left upper lobe with evidence of direct mediastinal invasion.  Tumor was associated with both the left main pulmonary artery as well as lateral aspect aortic arch.  Findings are highly concerning for malignancy.  He underwent biopsy by bronchoscopy which was positive for small cell undifferentiated carcinoma.  PET CT scan has been ordered.  Patient is on continuous oxygen.  He has been seen by medical oncology who is asked me to evaluate the patient for possible upfront radiation based on the patient's poor overall status and ability to make pain and tolerate chemotherapy.  Patient is seen in his hospital room.  He is on BiPAP.  Patient also has a history of CHF hypertension CKD stage II history of right nephrectomy. ? ?PLANNED TREATMENT REGIMEN: Radiation therapy to chest ? ?PAST MEDICAL HISTORY:  has a past medical history of Anxiety, Arthritis, Cancer ([place]), CHF (congestive heart failure) ([place]), Chronic kidney disease, COPD  (chronic obstructive pulmonary disease) ([place]), Dysrhythmia, History of kidney stones, Hypertension, Kidney stone, Pneumonia, and Shortness of breath dyspnea.   ? ?PAST SURGICAL HISTORY:  ?Past Surgical History:  ?Procedure Laterality Date  ? CYSTOSCOPY W/ RETROGRADES Bilateral 12/26/2017  ? Procedure: CYSTOSCOPY WITH RETROGRADE PYELOGRAM;  Surgeon: Abbie Sons, MD;  Location: [facility];  Service: Urology;  Laterality: Bilateral;  ? CYSTOSCOPY W/ RETROGRADES Right 02/09/2019  ? Procedure: CYSTOSCOPY WITH RETROGRADE PYELOGRAM;  Surgeon: Abbie Sons, MD;  Location: [facility];  Service: Urology;  Laterality: Right;  ? CYSTOSCOPY W/ URETERAL STENT PLACEMENT Right 07/29/2016  ? Procedure: CYSTOSCOPY WITH STENT REPLACEMENT;  Surgeon: Hollice Espy, MD;  Location: [facility];  Service: Urology;  Laterality: Right;  ? CYSTOSCOPY WITH BIOPSY Right 07/29/2016  ? Procedure: CYSTOSCOPY WITH BIOPSY;  Surgeon: Hollice Espy, MD;  Location: [facility];  Service: Urology;  Laterality: Right;  ? CYSTOSCOPY WITH BIOPSY Right 02/09/2019  ? Procedure: CYSTOSCOPY WITH BIOPSY;  Surgeon: Abbie Sons, MD;  Location: [facility];  Service: Urology;  Laterality: Right;  ? CYSTOSCOPY WITH STENT PLACEMENT Right 06/28/2016  ? Procedure: CYSTOSCOPY WITH STENT PLACEMENT;  Surgeon: Hollice Espy, MD;  Location: [facility];  Service: Urology;  Laterality: Right;  ? CYSTOSCOPY WITH STENT PLACEMENT Right 02/09/2019  ? Procedure: CYSTOSCOPY WITH STENT PLACEMENT;  Surgeon: Abbie Sons, MD;  Location: [facility];  Service: Urology;  Laterality: Right;  ? CYSTOSCOPY/URETEROSCOPY/HOLMIUM LASER/STENT PLACEMENT Right 12/26/2017  ? Procedure: CYSTOSCOPY/URETEROSCOPY/HOLMIUM LASER/STENT PLACEMENT;  Surgeon: Abbie Sons, MD;  Location: [facility];  Service: Urology;  Laterality: Right;  ? EXTRACORPOREAL SHOCK WAVE LITHOTRIPSY Right 12/04/2017  ? Procedure: EXTRACORPOREAL SHOCK WAVE LITHOTRIPSY (ESWL);  Surgeon: Hollice Espy, MD;  Location: [facility];   Service: Urology;  Laterality: Right;  ? PROSTATECTOMY    ? URETEROSCOPY Right 07/29/2016  ? Procedure: URETEROSCOPY;  Surgeon: Hollice Espy, MD;  Location: [facility];  Service: Urology;  Laterality: Right;  ? URETEROSCOPY Right 02/09/2019  ? Procedure: URETEROSCOPY- DIAGNOSTIC;  Surgeon: Abbie Sons, MD;  Location: [facility];  Service: Urology;  Laterality: Right;  ? VIDEO BRONCHOSCOPY WITH ENDOBRONCHIAL NAVIGATION Left 03/15/2022  ? Procedure: VIDEO BRONCHOSCOPY WITH ENDOBRONCHIAL NAVIGATION;  Surgeon: Ottie Glazier, MD;  Location: [facility];  Service: Thoracic;  Laterality: Left;  ? ? ?FAMILY HISTORY: family history includes Heart attack in his father; Kidney disease in his father; Lung cancer in his father; Stroke in his mother. ? ?SOCIAL HISTORY:  reports that he has been smoking cigarettes. He has been smoking an average of .5 packs per day. He has never used smokeless tobacco. He reports current alcohol use of about 14.0 standard drinks per week. He reports current drug use. Drug: Marijuana. ? ?ALLERGIES: Patient has no known allergies. ? ?MEDICATIONS:  ?Current Facility-Administered Medications  ?Medication Dose Route Frequency Provider Last Rate Last Admin  ? acetaminophen (TYLENOL) tablet 650 mg  650 mg Oral Q6H PRN Mansy, Arvella Merles, MD   650 mg at 03/16/22 0548  ? Or  ? acetaminophen (TYLENOL) suppository 650 mg  650 mg Rectal Q6H PRN Mansy, Jan A, MD      ? albuterol (PROVENTIL) (2.5 MG/3ML) 0.083% nebulizer solution 3 mL  3 mL Inhalation Q4H PRN Nicole Kindred A, DO   3 mL at 03/19/22 0809  ? apixaban (ELIQUIS) tablet 10 mg  10 mg Oral BID Wynelle Cleveland, RPH   10 mg at 03/20/22 5643  ? Followed by  ? [START ON 03/25/2022] apixaban (ELIQUIS) tablet 5 mg  5 mg Oral BID Wynelle Cleveland, RPH      ? atorvastatin (LIPITOR) tablet 80 mg  80 mg Oral Daily Mansy, Jan A, MD   80 mg at 03/20/22 0950  ? bisacodyl (DULCOLAX) EC tablet 5 mg  5 mg Oral Daily PRN Nicole Kindred A, DO   5 mg at 03/14/22 1001  ?  chlorhexidine (PERIDEX) 0.12 % solution 15 mL  15 mL Mouth Rinse BID Gwynne Edinger, MD   15 mL at 03/20/22 0951  ? Chlorhexidine Gluconate Cloth 2 % PADS 6 each  6 each Topical Q0600 Gwynne Edinger, MD   6 each at 03/20/22 [phone]  ? Glycerin (Adult) 2 g suppository 1 suppository  1 suppository Rectal Daily PRN Gwynne Edinger, MD   1 suppository at 03/18/22 1225  ? ipratropium-albuterol (DUONEB) 0.5-2.5 (3) MG/3ML nebulizer solution 3 mL  3 mL Nebulization Q6H PRN Ottie Glazier, MD   3 mL at 03/17/22 2127  ? MEDLINE mouth rinse  15 mL Mouth Rinse q12n4p Gwynne Edinger, MD   15 mL at 03/19/22 1653  ? polyethylene glycol (MIRALAX / GLYCOLAX) packet 17 g  17 g Oral Daily Nicole Kindred A, DO   17 g at 03/20/22 0950  ? predniSONE (DELTASONE) tablet 10 mg  10 mg Oral Q breakfast Ottie Glazier, MD   10 mg at 03/20/22 0950  ? senna-docusate (Senokot-S) tablet 1 tablet  1 tablet Oral BID Nicole Kindred A, DO   1 tablet at 03/20/22 0950  ? sodium phosphate (FLEET) 7-19 GM/118ML enema  1 enema  1 enema Rectal Daily PRN Gwynne Edinger, MD   1 enema at 03/16/22 0439  ? tamsulosin (FLOMAX) capsule 0.4 mg  0.4 mg Oral QPC supper Gwynne Edinger, MD   0.4 mg at 03/18/22 1803  ? traZODone (DESYREL) tablet 200 mg  200 mg Oral QHS PRN Gwynne Edinger, MD   200 mg at 03/19/22 2203  ? ? ?ECOG PERFORMANCE STATUS:  3 - Symptomatic, >50% confined to bed ? ?REVIEW OF SYSTEMS: Patient does have history of chronic diastolic CHF hypertension CKD stage II history of right nephrectomy COPD emphysema DOE ?Patient denies any weight loss, fatigue, weakness, fever, chills or night sweats. Patient denies any loss of vision, blurred vision. Patient denies any ringing  of the ears or hearing loss. No irregular heartbeat. Patient denies heart murmur or history of fainting. Patient denies any chest pain or pain radiating to her upper extremities. Patient denies any shortness of breath, difficulty breathing at night, cough or  hemoptysis. Patient denies any swelling in the lower legs. Patient denies any nausea vomiting, vomiting of blood, or coffee ground material in the vomitus. Patient denies any stomach pain. Patient states has had

## 2022-03-20 NOTE — Progress Notes (Signed)
? ? ? ?PULMONOLOGY ? ? ? ? ? ? ? ? ?Date: 03/20/2022,   ?MRN# [id number] Edwin Donovan. 1946/01/05 ? ? ?  ?AdmissionWeight: (!) 141.5 kg                 ?CurrentWeight: 128.1 kg ? ?Referring provider: Dr. Arbutus Ped ? ? ?CHIEF COMPLAINT:  ? ?Nonmassive PE with RUL nodule and mediastinal lymphadenopathy ? ? ?HISTORY OF PRESENT ILLNESS  ? ?This is a pleasant 76 year old male with a history of prostate CA, disorder, arthritis, CHF, CKD COPD, dysrhythmia history of nephrolithiasis and essential previous episode of pneumonia in 2017, he reports worsening cough and wheezing.  He was found to be mildly tachypneic on arrival and required 3 L of supplemental oxygen to reach normoxia at SPO2 95%.  Labs showed CKD however troponins were essentially normal in the context of CKD with only mild elevation.  Influenza and COVID-19 are negative.  CTA was performed with findings of subsegmental pulmonary emboli as well as 17 mm spiculated nodule of the right upper lobe and associated mediastinal lymphadenopathy/mass.  He had nephrectomy in the past due to renal cell carcinoma.  He quit smoking 2 years ago. PCCM consultation was placed for additional evaluation and management.  ? ? ?03/17/22- patient is improved, he is off BIPAP. He is speaking and answering appropriately to verbal communication. Have dcd IVF and tessalon.  Have ordered recruitment maneuvers with metaneb. CT chest today to eval lung as suggested by previous radiology report.  ? ? ? ? ?03/18/22 - patient is improved from respiratory perspective, he is on 6L/min and is speaking full sentences. VBG repeat today. Will start on eliquis from heparin ?03/19/22- patient has biopsy results from bronchoscopy with +small cell lung cancer and N1 nodal metastasis. He is on 6L/min [place] with Acute PE currently on eliquis. He is slowly improving clinically and is appreciative of care. I met with his [place] doctor today and we briefly reviewed medical plan.  ?Mar 20, 2022-patient's status post  CT abdomen pelvis with no newconcerning findings. He is more lucid and talktative today.we discussed his lung cancer diagnosis and he states Dr Janese Banks has already reviewed chemotherapy with him.  ? ?PAST MEDICAL HISTORY  ? ?Past Medical History:  ?Diagnosis Date  ?? Anxiety   ? ptsd  ?? Arthritis   ?? Cancer [facility])   ? PROSTATE  ?? CHF (congestive heart failure) ([employer])   ?? Chronic kidney disease   ? stones  ?? COPD (chronic obstructive pulmonary disease) ([place])   ?? Dysrhythmia   ?? History of kidney stones   ?? Hypertension   ?? Kidney stone   ?? Pneumonia   ? 06/27/16  ?? Shortness of breath dyspnea   ? ? ? ?SURGICAL HISTORY  ? ?Past Surgical History:  ?Procedure Laterality Date  ?? CYSTOSCOPY W/ RETROGRADES Bilateral 12/26/2017  ? Procedure: CYSTOSCOPY WITH RETROGRADE PYELOGRAM;  Surgeon: Abbie Sons, MD;  Location: [facility] ORS;  Service: Urology;  Laterality: Bilateral;  ?? CYSTOSCOPY W/ RETROGRADES Right 02/09/2019  ? Procedure: CYSTOSCOPY WITH RETROGRADE PYELOGRAM;  Surgeon: Abbie Sons, MD;  Location: [facility] ORS;  Service: Urology;  Laterality: Right;  ?? CYSTOSCOPY W/ URETERAL STENT PLACEMENT Right 07/29/2016  ? Procedure: CYSTOSCOPY WITH STENT REPLACEMENT;  Surgeon: Hollice Espy, MD;  Location: [facility];  Service: Urology;  Laterality: Right;  ?? CYSTOSCOPY WITH BIOPSY Right 07/29/2016  ? Procedure: CYSTOSCOPY WITH BIOPSY;  Surgeon: Hollice Espy, MD;  Location: [facility];  Service: Urology;  Laterality:  Right;  ?? CYSTOSCOPY WITH BIOPSY Right 02/09/2019  ? Procedure: CYSTOSCOPY WITH BIOPSY;  Surgeon: Abbie Sons, MD;  Location: [facility];  Service: Urology;  Laterality: Right;  ?? CYSTOSCOPY WITH STENT PLACEMENT Right 06/28/2016  ? Procedure: CYSTOSCOPY WITH STENT PLACEMENT;  Surgeon: Hollice Espy, MD;  Location: [facility];  Service: Urology;  Laterality: Right;  ?? CYSTOSCOPY WITH STENT PLACEMENT Right 02/09/2019  ? Procedure: CYSTOSCOPY WITH STENT PLACEMENT;  Surgeon: Abbie Sons, MD;  Location: [facility]  ORS;  Service: Urology;  Laterality: Right;  ?? CYSTOSCOPY/URETEROSCOPY/HOLMIUM LASER/STENT PLACEMENT Right 12/26/2017  ? Procedure: CYSTOSCOPY/URETEROSCOPY/HOLMIUM LASER/STENT PLACEMENT;  Surgeon: Abbie Sons, MD;  Location: [facility];  Service: Urology;  Laterality: Right;  ?? EXTRACORPOREAL SHOCK WAVE LITHOTRIPSY Right 12/04/2017  ? Procedure: EXTRACORPOREAL SHOCK WAVE LITHOTRIPSY (ESWL);  Surgeon: Hollice Espy, MD;  Location: [facility];  Service: Urology;  Laterality: Right;  ?? PROSTATECTOMY    ?? URETEROSCOPY Right 07/29/2016  ? Procedure: URETEROSCOPY;  Surgeon: Hollice Espy, MD;  Location: [facility];  Service: Urology;  Laterality: Right;  ?? URETEROSCOPY Right 02/09/2019  ? Procedure: URETEROSCOPY- DIAGNOSTIC;  Surgeon: Abbie Sons, MD;  Location: [facility];  Service: Urology;  Laterality: Right;  ?? VIDEO BRONCHOSCOPY WITH ENDOBRONCHIAL NAVIGATION Left 03/15/2022  ? Procedure: VIDEO BRONCHOSCOPY WITH ENDOBRONCHIAL NAVIGATION;  Surgeon: Ottie Glazier, MD;  Location: [facility];  Service: Thoracic;  Laterality: Left;  ? ? ? ?FAMILY HISTORY  ? ?Family History  ?Problem Relation Age of Onset  ?? Stroke Mother   ?? Heart attack Father   ?? Lung cancer Father   ?? Kidney disease Father   ?? Prostate cancer Neg Hx   ?? Bladder Cancer Neg Hx   ? ? ? ?SOCIAL HISTORY  ? ?Social History  ? ?Tobacco Use  ?? Smoking status: Some Days  ?  Packs/day: 0.50  ?  Types: Cigarettes  ?? Smokeless tobacco: Never  ?Vaping Use  ?? Vaping Use: Never used  ?Substance Use Topics  ?? Alcohol use: Yes  ?  Alcohol/week: 14.0 standard drinks  ?  Types: 14 Cans of beer per week  ?? Drug use: Yes  ?  Types: Marijuana  ?  Comment: "sometimes, not very often"  ? ? ? ?MEDICATIONS  ? ? ?Home Medication:  ?Current Outpatient Rx  ?? Order #: [id number] Class: No Print  ?? Order #: [id number] Class: No Print  ?? Order #: [id number] Class: No Print  ?? Order #: [id number] Class: No Print  ? ?  ?Current Medication: ? ?Current Facility-Administered  Medications:  ??  acetaminophen (TYLENOL) tablet 650 mg, 650 mg, Oral, Q6H PRN, 650 mg at 03/16/22 0548 **OR** acetaminophen (TYLENOL) suppository 650 mg, 650 mg, Rectal, Q6H PRN, Mansy, Jan A, MD ??  albuterol (PROVENTIL) (2.5 MG/3ML) 0.083% nebulizer solution 3 mL, 3 mL, Inhalation, Q4H PRN, Nicole Kindred A, DO, 3 mL at 03/19/22 0809 ??  apixaban (ELIQUIS) tablet 10 mg, 10 mg, Oral, BID, 10 mg at 03/20/22 0950 **FOLLOWED BY** [START ON 03/25/2022] apixaban (ELIQUIS) tablet 5 mg, 5 mg, Oral, BID, Wynelle Cleveland, RPH ??  atorvastatin (LIPITOR) tablet 80 mg, 80 mg, Oral, Daily, Mansy, Jan A, MD, 80 mg at 03/20/22 0950 ??  bisacodyl (DULCOLAX) EC tablet 5 mg, 5 mg, Oral, Daily PRN, Nicole Kindred A, DO, 5 mg at 03/14/22 1001 ??  chlorhexidine (PERIDEX) 0.12 % solution 15 mL, 15 mL, Mouth Rinse, BID, Wouk, Ailene Rud, MD, 15 mL at 03/20/22 0951 ??  Chlorhexidine Gluconate Cloth 2 % PADS 6 each,  6 each, Topical, Q0600, Gwynne Edinger, MD, 6 each at 03/20/22 [phone] ??  Glycerin (Adult) 2 g suppository 1 suppository, 1 suppository, Rectal, Daily PRN, Si Raider, Ailene Rud, MD, 1 suppository at 03/18/22 1225 ??  ipratropium-albuterol (DUONEB) 0.5-2.5 (3) MG/3ML nebulizer solution 3 mL, 3 mL, Nebulization, Q6H PRN, Ottie Glazier, MD, 3 mL at 03/17/22 2127 ??  MEDLINE mouth rinse, 15 mL, Mouth Rinse, q12n4p, Wouk, Ailene Rud, MD, 15 mL at 03/19/22 1653 ??  polyethylene glycol (MIRALAX / GLYCOLAX) packet 17 g, 17 g, Oral, Daily, Nicole Kindred A, DO, 17 g at 03/20/22 0950 ??  predniSONE (DELTASONE) tablet 10 mg, 10 mg, Oral, Q breakfast, Vijay Durflinger, MD, 10 mg at 03/20/22 0950 ??  senna-docusate (Senokot-S) tablet 1 tablet, 1 tablet, Oral, BID, Ezekiel Slocumb, DO, 1 tablet at 03/20/22 0950 ??  sodium phosphate (FLEET) 7-19 GM/118ML enema 1 enema, 1 enema, Rectal, Daily PRN, Si Raider, Ailene Rud, MD, 1 enema at 03/16/22 0439 ??  tamsulosin (FLOMAX) capsule 0.4 mg, 0.4 mg, Oral, QPC supper, Wouk, Ailene Rud,  MD, 0.4 mg at 03/18/22 1803 ??  traZODone (DESYREL) tablet 200 mg, 200 mg, Oral, QHS PRN, Si Raider, Ailene Rud, MD, 200 mg at 03/19/22 2203 ? ? ? ?ALLERGIES  ? ?Patient has no known allergies. ? ? ? ? ?RE

## 2022-03-20 NOTE — TOC Progression Note (Signed)
Transition of Care (TOC) - Progression Note  ? ? ?Patient Details  ?Name: Edwin Donovan. ?MRN: [id number] ?Date of Birth: 1946-03-31 ? ?Transition of Care (TOC) CM/SW Contact  ?Laurena Slimmer, RN ?Phone Number: ?03/20/2022, 2:01 PM ? ?Clinical Narrative:    ?Patient will require radiation through next week. Admission coordinator at [facility] notified discharge is pending.  ? ? ?Expected Discharge Plan: [place] ?Barriers to Discharge: Continued Medical Work up ? ?Expected Discharge Plan and Services ?Expected Discharge Plan: [place] ?  ?  ?Post Acute Care Choice: [place] ?Living arrangements for the past 2 months: [place] ?Expected Discharge Date: 03/12/22               ?  ?  ?  ?  ?  ?  ?  ?  ?  ?  ? ? ?Social Determinants of Health (SDOH) Interventions ?  ? ?Readmission Risk Interventions ? ?  03/13/2022  ?  3:42 PM  ?Readmission Risk Prevention Plan  ?Transportation Screening Complete  ?PCP or Specialist Appt within 3-5 Days Complete  ?[place] or Home Care Consult Complete  ?Social Work Consult for [place] Planning/Counseling Complete  ?Palliative Care Screening Not Applicable  ?Medication Review [employer]) Complete  ? ? ?

## 2022-03-20 NOTE — Progress Notes (Signed)
?Progress Note ? ? ?Patient: Edwin Donovan. EZM:[id number] DOB: [birth year] DOA: 03/09/2022     11 ?DOS: the patient was seen and examined on 03/20/2022 ?  ?Brief hospital course: ?Edwin Donovan. is a 76 y.o. male with past medical history COPD not on home oxygen prior to recent hospital d/c on 03/20/53, chronic diastolic CHF, hypertension and CKD stage II with hx or Right nephrectomy.  He presented to the ED  with progressively worsening lower extremity edema, significant orthopnea, dyspnea worsening on exertion over the last couple weeks, in addition to worsening cough with wheezing.   ? ?Chest CTA  - two isolated subsegmental pulmonary emboli at the bifurcation in the right upper lobe pulmonary artery. Overall clot burden is very small.    ?17 mm spiculated nodule in the posterior right upper lobe.  Associated 10.0 x 7.4 cm left mediastinal mass extending into the medial left upper lobe and left perihilar region. This appearance raises concern for primary bronchogenic neoplasm such as small cell lung cancer. ? ?Admitted to hospitalist service with pulmonology, oncology and palliative care consulted.  ? ? ?Assessment and Plan: ?Acute hypoxemic respiratory failure. ?COPD with acute exacerbation. ?Acute on chronic diastolic congestive heart failure. ?Acute pulmonary emboli ?Small cell lung cancer. ?Patient no longer has any of volume overload.  No additional bronchospasm. ?Patient still requiring BiPAP at night with 50% oxygen and at daytime 6 L oxygen. ?Current hypoxemia mainly from lung cancer with pulmonary emboli. ?Continue anticoagulation which has switched to Eliquis. ?Discussed with oncology, patient will be treated with radiation therapy while in the hospital.  Hopefully that will improve patient oxygenation.  Currently, patient cannot be discharged due to severe hypoxemia. ? ?Urinary retention. ?Constipation. ?Patient still receiving Flomax, had a bowel movement yesterday after stool  softener.  Continue as needed bladder scan continue Flomax ? ?Chronic kidney disease stage IIIa. ?Acute kidney injury ruled out. ?Patient kidney function has been stable since arriving the hospital.  He did not have a lab drawn for the last 3 years prior to this admission.  Patient probably has chronic kidney disease stage IIIa instead of AKI. ? ?  ? ?Subjective:  ?Patient still has significant hypoxia, he required BiPAP and 50% oxygen at night.  Daytime he is on 6 L oxygen.  Short of breath seem to be improving.  Cough, nonproductive. ? ? ?Physical Exam: ?Vitals:  ? 03/20/22 0428 03/20/22 0805 03/20/22 0900 03/20/22 1100  ?BP: 129/80     ?Pulse: 99 94    ?Resp: 20 20  18   ?Temp: 98 ?F (36.7 ?C)  98.4 ?F (36.9 ?C) 98.1 ?F (36.7 ?C)  ?TempSrc:      ?SpO2: 95% 100%  92%  ?Weight:      ?Height:      ?General exam: Appears calm and comfortable  ?Respiratory system: Clear to auscultation. Respiratory effort normal. ?Cardiovascular system: S1 & S2 heard, RRR. No JVD, murmurs, rubs, gallops or clicks. No pedal edema. ?Gastrointestinal system: Abdomen is nondistended, soft and nontender. No organomegaly or masses felt. Normal bowel sounds heard. ?Central nervous system: Alert and oriented. No focal neurological deficits. ?Extremities: Symmetric 5 x 5 power. ?Skin: No rashes, lesions or ulcers ?Psychiatry: Judgement and insight appear normal. Mood & affect appropriate.  ? ?Data Reviewed: ? ?Reviewed the chest CT scan, all lab results.  Culture results. ? ?Family Communication: He does not have any immediate family ? ?Disposition: ?Status is: Inpatient ?Remains inpatient appropriate because: Severity of disease, severe hypoxemia. ?  Planned Discharge Destination: Home with Home Health ? ? ? ?Time spent: 45 minutes ? ?Author: ?Sharen Hones, MD ?03/20/2022 1:26 PM ? ?For on call review [email or web address].  ?

## 2022-03-20 NOTE — Progress Notes (Signed)
OT Cancellation Note ? ?Patient Details ?Name: Edwin Donovan. ?MRN: [id number] ?DOB: [birth year] ? ? ?Cancelled Treatment:    Reason Eval/Treat Not Completed: Other (comment);Patient declined, no reason specified (pt declined, reporting fatigue and feeling SOB on 92% via HFNC on 6 L. Spo2 at 92%. Agreeable for OT to re-attempt at a later time. OT will reattempt as able.) ? ?Shanon Payor, OTD OTR/L  ?03/20/22, 12:14 PM  ?

## 2022-03-21 ENCOUNTER — Ambulatory Visit: Payer: Medicare Other | Attending: Radiation Oncology

## 2022-03-21 ENCOUNTER — Inpatient Hospital Stay: Payer: Medicare Other

## 2022-03-21 DIAGNOSIS — Z515 Encounter for palliative care: Secondary | ICD-10-CM | POA: Diagnosis not present

## 2022-03-21 DIAGNOSIS — N1831 Chronic kidney disease, stage 3a: Secondary | ICD-10-CM

## 2022-03-21 DIAGNOSIS — I2699 Other pulmonary embolism without acute cor pulmonale: Secondary | ICD-10-CM | POA: Diagnosis not present

## 2022-03-21 DIAGNOSIS — F17210 Nicotine dependence, cigarettes, uncomplicated: Secondary | ICD-10-CM | POA: Diagnosis not present

## 2022-03-21 DIAGNOSIS — C3412 Malignant neoplasm of upper lobe, left bronchus or lung: Secondary | ICD-10-CM | POA: Diagnosis not present

## 2022-03-21 DIAGNOSIS — I5033 Acute on chronic diastolic (congestive) heart failure: Secondary | ICD-10-CM | POA: Diagnosis not present

## 2022-03-21 LAB — BLOOD GAS, VENOUS
Acid-Base Excess: 9.7 mmol/L — ABNORMAL HIGH (ref 0.0–2.0)
Bicarbonate: 37.9 mmol/L — ABNORMAL HIGH (ref 20.0–28.0)
O2 Saturation: 61.8 %
Patient temperature: 37
pCO2, Ven: 67 mmHg — ABNORMAL HIGH (ref 44–60)
pH, Ven: 7.36 (ref 7.25–7.43)
pO2, Ven: 41 mmHg (ref 32–45)

## 2022-03-21 NOTE — Progress Notes (Signed)
? ?  ?Palliative Medicine ?[place] at [facility] ?[phone] [phone] ? ? ?Name: Edwin Donovan. ?Date: 03/21/2022 ?MRN: [id number]  ?DOB: 01-11-1946 ? ?Patient Care Team: ?System, Provider Not In as PCP - General ?Telford Nab, RN as Oncology Nurse Navigator  ? ? ?REASON FOR CONSULTATION: ?Edwin Philson. is a 76 y.o. male with multiple medical problems including diastolic dysfunction with history of CHF, hypertension, CKD, and COPD on 3 L O2 for the past several weeks, history of prostate and urothelial cancer status post surgery, who was admitted to the hospital 03/09/2022 for management of acute PE after presenting with shortness of breath.  CTA of the chest showed 2 small subsegmental PEs the patient also found to have a 10 cm left mediastinal mass concerning for primary bronchogenic cancer.  Biopsy positive for small cell lung cancer.  Palliative care was consulted to help address goals..  ? ?CODE STATUS: DNR ? ?PAST MEDICAL HISTORY: ?Past Medical History:  ?Diagnosis Date  ? Anxiety   ? ptsd  ? Arthritis   ? Cancer [facility])   ? PROSTATE  ? CHF (congestive heart failure) ([place])   ? Chronic kidney disease   ? stones  ? COPD (chronic obstructive pulmonary disease) ([place])   ? Dysrhythmia   ? History of kidney stones   ? Hypertension   ? Kidney stone   ? Pneumonia   ? 06/27/16  ? Shortness of breath dyspnea   ? ? ?PAST SURGICAL HISTORY:  ?Past Surgical History:  ?Procedure Laterality Date  ? CYSTOSCOPY W/ RETROGRADES Bilateral 12/26/2017  ? Procedure: CYSTOSCOPY WITH RETROGRADE PYELOGRAM;  Surgeon: Abbie Sons, MD;  Location: [facility];  Service: Urology;  Laterality: Bilateral;  ? CYSTOSCOPY W/ RETROGRADES Right 02/09/2019  ? Procedure: CYSTOSCOPY WITH RETROGRADE PYELOGRAM;  Surgeon: Abbie Sons, MD;  Location: [facility];  Service: Urology;  Laterality: Right;  ? CYSTOSCOPY W/ URETERAL STENT PLACEMENT Right 07/29/2016  ? Procedure: CYSTOSCOPY WITH STENT  REPLACEMENT;  Surgeon: Hollice Espy, MD;  Location: [facility];  Service: Urology;  Laterality: Right;  ? CYSTOSCOPY WITH BIOPSY Right 07/29/2016  ? Procedure: CYSTOSCOPY WITH BIOPSY;  Surgeon: Hollice Espy, MD;  Location: [facility];  Service: Urology;  Laterality: Right;  ? CYSTOSCOPY WITH BIOPSY Right 02/09/2019  ? Procedure: CYSTOSCOPY WITH BIOPSY;  Surgeon: Abbie Sons, MD;  Location: [facility];  Service: Urology;  Laterality: Right;  ? CYSTOSCOPY WITH STENT PLACEMENT Right 06/28/2016  ? Procedure: CYSTOSCOPY WITH STENT PLACEMENT;  Surgeon: Hollice Espy, MD;  Location: [facility];  Service: Urology;  Laterality: Right;  ? CYSTOSCOPY WITH STENT PLACEMENT Right 02/09/2019  ? Procedure: CYSTOSCOPY WITH STENT PLACEMENT;  Surgeon: Abbie Sons, MD;  Location: [facility];  Service: Urology;  Laterality: Right;  ? CYSTOSCOPY/URETEROSCOPY/HOLMIUM LASER/STENT PLACEMENT Right 12/26/2017  ? Procedure: CYSTOSCOPY/URETEROSCOPY/HOLMIUM LASER/STENT PLACEMENT;  Surgeon: Abbie Sons, MD;  Location: [facility];  Service: Urology;  Laterality: Right;  ? EXTRACORPOREAL SHOCK WAVE LITHOTRIPSY Right 12/04/2017  ? Procedure: EXTRACORPOREAL SHOCK WAVE LITHOTRIPSY (ESWL);  Surgeon: Hollice Espy, MD;  Location: [facility];  Service: Urology;  Laterality: Right;  ? PROSTATECTOMY    ? URETEROSCOPY Right 07/29/2016  ? Procedure: URETEROSCOPY;  Surgeon: Hollice Espy, MD;  Location: [facility];  Service: Urology;  Laterality: Right;  ? URETEROSCOPY Right 02/09/2019  ? Procedure: URETEROSCOPY- DIAGNOSTIC;  Surgeon: Abbie Sons, MD;  Location: [facility];  Service: Urology;  Laterality: Right;  ? VIDEO BRONCHOSCOPY WITH ENDOBRONCHIAL NAVIGATION Left  03/15/2022  ? Procedure: VIDEO BRONCHOSCOPY WITH ENDOBRONCHIAL NAVIGATION;  Surgeon: Ottie Glazier, MD;  Location: [facility];  Service: Thoracic;  Laterality: Left;  ? ? ?HEMATOLOGY/ONCOLOGY HISTORY:  ?Oncology History  ?Urothelial carcinoma ([place])  ?02/28/2019 Initial Diagnosis  ? Urothelial  carcinoma ([place]) ?  ?03/12/2019 -  Chemotherapy  ? The patient had palonosetron (ALOXI) injection 0.25 mg, 0.25 mg, Intravenous,  Once, 0 of 4 cycles ?CISplatin (PLATINOL) 176 mg in sodium chloride 0.9 % 500 mL chemo infusion, 70 mg/m2 = 176 mg, Intravenous,  Once, 0 of 4 cycles ?gemcitabine (GEMZAR) 2,508 mg in sodium chloride 0.9 % 250 mL chemo infusion, 1,000 mg/m2 = 2,508 mg, Intravenous,  Once, 0 of 4 cycles ?fosaprepitant (EMEND) 150 mg, dexamethasone (DECADRON) 12 mg in sodium chloride 0.9 % 145 mL IVPB, , Intravenous,  Once, 0 of 4 cycles ? for chemotherapy treatment.  ?  ? ? ?ALLERGIES:  has No Known Allergies. ? ?MEDICATIONS:  ?Current Facility-Administered Medications  ?Medication Dose Route Frequency Provider Last Rate Last Admin  ? acetaminophen (TYLENOL) tablet 650 mg  650 mg Oral Q6H PRN Mansy, Arvella Merles, MD   650 mg at 03/16/22 0548  ? Or  ? acetaminophen (TYLENOL) suppository 650 mg  650 mg Rectal Q6H PRN Mansy, Jan A, MD      ? albuterol (PROVENTIL) (2.5 MG/3ML) 0.083% nebulizer solution 3 mL  3 mL Inhalation Q4H PRN Nicole Kindred A, DO   3 mL at 03/19/22 0809  ? apixaban (ELIQUIS) tablet 10 mg  10 mg Oral BID Wynelle Cleveland, RPH   10 mg at 03/21/22 6767  ? Followed by  ? [START ON 03/25/2022] apixaban (ELIQUIS) tablet 5 mg  5 mg Oral BID Wynelle Cleveland, RPH      ? atorvastatin (LIPITOR) tablet 80 mg  80 mg Oral Daily Mansy, Jan A, MD   80 mg at 03/21/22 2094  ? bisacodyl (DULCOLAX) EC tablet 5 mg  5 mg Oral Daily PRN Nicole Kindred A, DO   5 mg at 03/14/22 1001  ? chlorhexidine (PERIDEX) 0.12 % solution 15 mL  15 mL Mouth Rinse BID Gwynne Edinger, MD   15 mL at 03/20/22 2201  ? Chlorhexidine Gluconate Cloth 2 % PADS 6 each  6 each Topical Q0600 Gwynne Edinger, MD   6 each at 03/21/22 [phone]  ? Glycerin (Adult) 2 g suppository 1 suppository  1 suppository Rectal Daily PRN Gwynne Edinger, MD   1 suppository at 03/18/22 1225  ? ipratropium-albuterol (DUONEB) 0.5-2.5 (3) MG/3ML nebulizer  solution 3 mL  3 mL Nebulization Q6H PRN Ottie Glazier, MD   3 mL at 03/17/22 2127  ? MEDLINE mouth rinse  15 mL Mouth Rinse q12n4p Gwynne Edinger, MD   15 mL at 03/20/22 1646  ? polyethylene glycol (MIRALAX / GLYCOLAX) packet 17 g  17 g Oral Daily Nicole Kindred A, DO   17 g at 03/20/22 0950  ? predniSONE (DELTASONE) tablet 5 mg  5 mg Oral Q breakfast Ottie Glazier, MD   5 mg at 03/21/22 0835  ? senna-docusate (Senokot-S) tablet 1 tablet  1 tablet Oral BID Nicole Kindred A, DO   1 tablet at 03/21/22 1411  ? sodium phosphate (FLEET) 7-19 GM/118ML enema 1 enema  1 enema Rectal Daily PRN Gwynne Edinger, MD   1 enema at 03/16/22 0439  ? tamsulosin (FLOMAX) capsule 0.4 mg  0.4 mg Oral QPC supper Gwynne Edinger, MD   0.4 mg at 03/20/22  1803  ? traZODone (DESYREL) tablet 200 mg  200 mg Oral QHS PRN Gwynne Edinger, MD   200 mg at 03/19/22 2203  ? ? ?VITAL SIGNS: ?BP 140/88 (BP Location: Right Arm)   Pulse (!) 102   Temp 98.1 ?F (36.7 ?C) (Oral)   Resp (!) 21   Ht 5\' 11"  (1.803 m)   Wt 280 lb 6.8 oz (127.2 kg)   SpO2 96%   BMI 39.11 kg/m?  ?Filed Weights  ? 03/19/22 0309 03/20/22 0100 03/21/22 0500  ?Weight: 280 lb 10.3 oz (127.3 kg) 282 lb 6.6 oz (128.1 kg) 280 lb 6.8 oz (127.2 kg)  ?  ?Estimated body mass index is 39.11 kg/m? as calculated from the following: ?  Height as of this encounter: 5\' 11"  (1.803 m). ?  Weight as of this encounter: 280 lb 6.8 oz (127.2 kg). ? ?LABS: ?CBC: ?   ?Component Value Date/Time  ? WBC 8.5 03/20/2022 0611  ? HGB 12.0 (L) 03/20/2022 0938  ? HCT 38.5 (L) 03/20/2022 1829  ? PLT 148 (L) 03/20/2022 9371  ? MCV 97.5 03/20/2022 0611  ? NEUTROABS 6.4 02/27/2022 0023  ? LYMPHSABS 0.8 02/27/2022 0023  ? MONOABS 0.5 02/27/2022 0023  ? EOSABS 0.2 02/27/2022 0023  ? BASOSABS 0.0 02/27/2022 0023  ? ?Comprehensive Metabolic Panel: ?   ?Component Value Date/Time  ? NA 139 03/20/2022 0611  ? K 4.6 03/20/2022 0611  ? CL 103 03/20/2022 0611  ? CO2 30 03/20/2022 0611  ? BUN 52 (H)  03/20/2022 6967  ? BUN 18 10/21/2016 1614  ? CREATININE 1.46 (H) 03/20/2022 8938  ? GLUCOSE 103 (H) 03/20/2022 1017  ? CALCIUM 8.9 03/20/2022 0611  ? AST 17 03/09/2022 1258  ? ALT 15 03/09/2022 1258  ? ALKPHOS 62

## 2022-03-21 NOTE — Progress Notes (Signed)
Physical assessment with Clinical Instructor, D Giavonni Cizek RN ?

## 2022-03-21 NOTE — Progress Notes (Signed)
?  Progress Note ? ? ?Patient: Edwin Donovan. ALP:[id number] DOB: 14-Jan-1946 DOA: 03/09/2022     12 ?DOS: the patient was seen and examined on 03/21/2022 ?  ?Brief hospital course: ?Edwin Donovan. is a 76 y.o. male with past medical history COPD not on home oxygen prior to recent hospital d/c on 3/53/29, chronic diastolic CHF, hypertension and CKD stage II with hx or Right nephrectomy.  He presented to the ED  with progressively worsening lower extremity edema, significant orthopnea, dyspnea worsening on exertion over the last couple weeks, in addition to worsening cough with wheezing.   ? ?Chest CTA  - two isolated subsegmental pulmonary emboli at the bifurcation in the right upper lobe pulmonary artery. Overall clot burden is very small.    ?17 mm spiculated nodule in the posterior right upper lobe.  Associated 10.0 x 7.4 cm left mediastinal mass extending into the medial left upper lobe and left perihilar region. This appearance raises concern for primary bronchogenic neoplasm such as small cell lung cancer. ?Patient developed acute respiratory failure requiring BiPAP and high flow oxygen.  This is mainly due to lung cancer ? ?Oncology decided that patient will complete radiation therapy while in the hospital. ? ? ?Assessment and Plan: ?Acute hypoxemic respiratory failure. ?COPD with acute exacerbation. ?Acute on chronic diastolic congestive heart failure. ?Acute pulmonary emboli ?Small cell lung cancer. ?Patient still requiring BiPAP and 50% oxygen at nighttime, 6 L oxygen at daytime.  Condition severe but relatively stable. ?Oncology had decided to complete radiation therapy while in the hospital.  Hopefully oxygenation can be better afterwards. ?Continue anticoagulation for PE. ?Continue oxygen and as needed BiPAP. ?  ?Urinary retention. ?Constipation. ?Condition improving. ?  ?Chronic kidney disease stage IIIa. ?Acute kidney injury ruled out. ?History of renal cancer status post  nephrostomy. ?Renal function still stable. ? ?Prostate cancer. ?Follow-up with PCP as outpatient. ? ?SLE. ?Follow for now.  ? ?  ? ?Subjective:  ?Patient still has significant hypoxia requiring 6 L oxygen at daytime, 50% oxygen with BiPAP at nighttime. ?He does not feel short of breath. ?He had a bowel movement after stool softener, no nausea vomiting.  Appetite is poor. ? ?Physical Exam: ?Vitals:  ? 03/21/22 0800 03/21/22 0900 03/21/22 0954 03/21/22 1100  ?BP: 132/90  125/74 (!) 143/92  ?Pulse: 92 99 100 (!) 107  ?Resp: (!) 23 20 (!) 24 (!) 23  ?Temp:    98 ?F (36.7 ?C)  ?TempSrc:      ?SpO2: 92% 98% 100% 95%  ?Weight:      ?Height:      ? ?General exam: Appears calm and comfortable, ill appaering ?Respiratory system: Clear to auscultation. Respiratory effort normal. ?Cardiovascular system: S1 & S2 heard, RRR. No JVD, murmurs, rubs, gallops or clicks. No pedal edema. ?Gastrointestinal system: Abdomen is nondistended, soft and nontender. No organomegaly or masses felt. Normal bowel sounds heard. ?Central nervous system: Alert and oriented x2. No focal neurological deficits. ?Extremities: Symmetric 5 x 5 power. ?Skin: No rashes, lesions or ulcers ?Psychiatry: Judgement and insight appear normal. Mood & affect appropriate.  ? ?Data Reviewed: ? ?Lab results reviewed. ? ?Family Communication: Updated the patient's significant other. ? ?Disposition: ?Status is: Inpatient ?Remains inpatient appropriate because: Severity of disease, with severe respiratory failure ? Planned Discharge Destination: Skilled nursing facility ? ? ? ?Time spent: 37 minutes ? ?Author: ?Sharen Hones, MD ?03/21/2022 12:34 PM ? ?For on call review [email or web address].  ?

## 2022-03-21 NOTE — Progress Notes (Signed)
Physical Therapy Treatment ?Patient Details ?Name: Edwin Donovan. ?MRN: [id number] ?DOB: 12-21-1945 ?Today's Date: 03/21/2022 ? ? ?History of Present Illness Pt is a 76 year old male presenting to the hospital  with progressively worsening lower extremity edema, significant orthopnea, dyspnea worsening on exertion over the last couple weeks, in addition to worsening cough with wheezing.  Chest x-Marcella Dunnaway reveals large masslike opacity in the left hilumn chest CTA reveals two isolated subsegmental pulmonary emboli at the bifurcation in the right upper lobe pulmonary artery. Overall clot burden is very small.     17 mm spiculated nodule in the posterior right upper lobe.  Associated 10.0 x 7.4 cm left mediastinal mass extending into the medial left upper lobe and left perihilar region. This appearance raises concern for primary bronchogenic neoplasm such as small cell lung cancer.PMH significant for COPD not on home oxygen prior to recent hospital d/c on 2/95/28, chronic diastolic CHF, hypertension and CKD stage II with hx or Right nephrectomy. Hospital stay complicated by+ PE and transfer to CCU due to decreased respiratory status. Re-evaluation consult received. ? ?  ?PT Comments  ? ? Pt is making gradual progress towards goals with ability to tolerate sitting at EOB for extended time. Pt anxious with trial, however pleased with ability to tolerate. On HFNC on 8L with sats briefly decreasing to 84% with seated there-ex, however quickly progresses with PLB to 97%. Then able to perform several lateral scoots at EOB to reach [facility]. At end of session, left with RN staff to adjust O2 levels. Will continue to progress as able.   ?Recommendations for follow up therapy are one component of a multi-disciplinary discharge planning process, led by the attending physician.  Recommendations may be updated based on patient status, additional functional criteria and insurance authorization. ? ?Follow Up Recommendations ? Skilled  nursing-short term rehab (<3 hours/day) ?  ?  ?Assistance Recommended at Discharge Frequent or constant Supervision/Assistance  ?Patient can return home with the following Two people to help with walking and/or transfers;Two people to help with bathing/dressing/bathroom ?  ?Equipment Recommendations ? None recommended by PT  ?  ?Recommendations for Other Services   ? ? ?  ?Precautions / Restrictions Precautions ?Precautions: Fall ?Restrictions ?Weight Bearing Restrictions: No  ?  ? ?Mobility ? Bed Mobility ?Overal bed mobility: Needs Assistance ?Bed Mobility: Supine to Sit, Sit to Supine ?  ?  ?Supine to sit: Mod assist ?Sit to supine: Min assist ?  ?General bed mobility comments: able to follow commands and perform supine->sit transfer. ONce seated, able to maintain O2 sats on 8L of HFNC ranging from 84-97% depending on activity. CUes for PLB. Able to perform lateral transfers with mod assist up to [facility] prior to return back to bed. ?  ? ?Transfers ?  ?  ?  ?  ?  ?  ?  ?  ?  ?General transfer comment: unable to perform this date ?  ? ?Ambulation/Gait ?  ?  ?  ?  ?  ?  ?  ?  ? ? ?Stairs ?  ?  ?  ?  ?  ? ? ?Wheelchair Mobility ?  ? ?Modified Rankin (Stroke Patients Only) ?  ? ? ?  ?Balance Overall balance assessment: Needs assistance ?Sitting-balance support: Feet supported ?Sitting balance-Leahy Scale: Good ?  ?  ?  ?  ?  ?  ?  ?  ?  ?  ?  ?  ?  ?  ?  ?  ?  ? ?  ?  Cognition Arousal/Alertness: Awake/alert ?Behavior During Therapy: [facility] for tasks assessed/performed ?Overall Cognitive Status: Within Functional Limits for tasks assessed ?  ?  ?  ?  ?  ?  ?  ?  ?  ?  ?  ?  ?  ?  ?  ?  ?General Comments: dysphonic, pleasant and agreeable ?  ?  ? ?  ?Exercises Other Exercises ?Other Exercises: supine ther-ex performed on B LE including quad sets, SLRs, hip abd/add, and AP. 10 reps performed with cues for PLB between sets. ALso performed LAQ x 10 reps once seated at EOB. ? ?  ?General Comments   ?  ?  ? ?Pertinent Vitals/Pain  Pain Assessment ?Pain Assessment: No/denies pain  ? ? ?Home Living   ?  ?  ?  ?  ?  ?  ?  ?  ?  ?   ?  ?Prior Function    ?  ?  ?   ? ?PT Goals (current goals can now be found in the care plan section) Acute Rehab PT Goals ?Patient Stated Goal: to get stronger first ?PT Goal Formulation: With patient ?Time For Goal Achievement: 04/02/22 ?Potential to Achieve Goals: Fair ?Progress towards PT goals: Progressing toward goals ? ?  ?Frequency ? ? ? Min 2X/week ? ? ? ?  ?PT Plan Current plan remains appropriate  ? ? ?Co-evaluation   ?  ?  ?  ?  ? ?  ?AM-PAC PT "6 Clicks" Mobility   ?Outcome Measure ? Help needed turning from your back to your side while in a flat bed without using bedrails?: A Lot ?Help needed moving from lying on your back to sitting on the side of a flat bed without using bedrails?: A Lot ?Help needed moving to and from a bed to a chair (including a wheelchair)?: Total ?Help needed standing up from a chair using your arms (e.g., wheelchair or bedside chair)?: Total ?Help needed to walk in hospital room?: Total ?Help needed climbing 3-5 steps with a railing? : Total ?6 Click Score: 8 ? ?  ?End of Session Equipment Utilized During Treatment: Oxygen ?Activity Tolerance: Patient limited by fatigue ?Patient left: in bed (left with RN and RN student in room) ?Nurse Communication: Mobility status ?PT Visit Diagnosis: Unsteadiness on feet (R26.81);Muscle weakness (generalized) (M62.81);History of falling (Z91.81);Difficulty in walking, not elsewhere classified (R26.2) ?  ? ? ?Time: 6226-3335 ?PT Time Calculation (min) (ACUTE ONLY): 23 min ? ?Charges:  $Therapeutic Exercise: 23-37 mins          ?          ? ?Greggory Stallion, PT, DPT, GCS ?[phone] ? ? ? ?[name] ?03/21/2022, 11:59 AM ? ?

## 2022-03-21 NOTE — Progress Notes (Signed)
? ? ? ?PULMONOLOGY ? ? ? ? ? ? ? ? ?Date: 03/21/2022,   ?MRN# [id number] Edwin Donovan. May 25, 1946 ? ? ?  ?AdmissionWeight: (!) 141.5 kg                 ?CurrentWeight: 127.2 kg ? ?Referring provider: Dr. Arbutus Ped ? ? ?CHIEF COMPLAINT:  ? ?Nonmassive PE with RUL nodule and mediastinal lymphadenopathy ? ? ?HISTORY OF PRESENT ILLNESS  ? ?This is a pleasant 76 year old male with a history of prostate CA, disorder, arthritis, CHF, CKD COPD, dysrhythmia history of nephrolithiasis and essential previous episode of pneumonia in 2017, he reports worsening cough and wheezing.  He was found to be mildly tachypneic on arrival and required 3 L of supplemental oxygen to reach normoxia at SPO2 95%.  Labs showed CKD however troponins were essentially normal in the context of CKD with only mild elevation.  Influenza and COVID-19 are negative.  CTA was performed with findings of subsegmental pulmonary emboli as well as 17 mm spiculated nodule of the right upper lobe and associated mediastinal lymphadenopathy/mass.  He had nephrectomy in the past due to renal cell carcinoma.  He quit smoking 2 years ago. PCCM consultation was placed for additional evaluation and management.  ? ? ?03/17/22- patient is improved, he is off BIPAP. He is speaking and answering appropriately to verbal communication. Have dcd IVF and tessalon.  Have ordered recruitment maneuvers with metaneb. CT chest today to eval lung as suggested by previous radiology report.  ? ? ? ? ?03/18/22 - patient is improved from respiratory perspective, he is on 6L/min and is speaking full sentences. VBG repeat today. Will start on eliquis from heparin ?03/19/22- patient has biopsy results from bronchoscopy with +small cell lung cancer and N1 nodal metastasis. He is on 6L/min [place] with Acute PE currently on eliquis. He is slowly improving clinically and is appreciative of care. I met with his [place] doctor today and we briefly reviewed medical plan.  ?Mar 20, 2022-patient's status post  CT abdomen pelvis with no newconcerning findings. He is more lucid and talktative today.we discussed his lung cancer diagnosis and he states Dr Janese Banks has already reviewed chemotherapy with him.  ?03/21/2022-patient seems to be improved clinically.  He is able to communicate without labored breathing however does have hoarseness of voice and shares that he did not sleep well.  His procalcitonin is negative.  Were planning on doing a repeat chest x-ray after performing recruitment maneuvers to review interval changes.  Also get venous blood gas for interval changes.  He does have a BiPAP ordered for home use and [employer] is working on this.  Status post medical oncology evaluation with plan to initiate chemotherapy and possible radiation.  We will attempt to wean down oxygen level with goal of SPO2 88% or higher. ? ?PAST MEDICAL HISTORY  ? ?Past Medical History:  ?Diagnosis Date  ?? Anxiety   ? ptsd  ?? Arthritis   ?? Cancer [facility])   ? PROSTATE  ?? CHF (congestive heart failure) ([place])   ?? Chronic kidney disease   ? stones  ?? COPD (chronic obstructive pulmonary disease) ([place])   ?? Dysrhythmia   ?? History of kidney stones   ?? Hypertension   ?? Kidney stone   ?? Pneumonia   ? 06/27/16  ?? Shortness of breath dyspnea   ? ? ? ?SURGICAL HISTORY  ? ?Past Surgical History:  ?Procedure Laterality Date  ?? CYSTOSCOPY W/ RETROGRADES Bilateral 12/26/2017  ? Procedure: CYSTOSCOPY  WITH RETROGRADE PYELOGRAM;  Surgeon: Abbie Sons, MD;  Location: [facility];  Service: Urology;  Laterality: Bilateral;  ?? CYSTOSCOPY W/ RETROGRADES Right 02/09/2019  ? Procedure: CYSTOSCOPY WITH RETROGRADE PYELOGRAM;  Surgeon: Abbie Sons, MD;  Location: [facility];  Service: Urology;  Laterality: Right;  ?? CYSTOSCOPY W/ URETERAL STENT PLACEMENT Right 07/29/2016  ? Procedure: CYSTOSCOPY WITH STENT REPLACEMENT;  Surgeon: Hollice Espy, MD;  Location: [facility];  Service: Urology;  Laterality: Right;  ?? CYSTOSCOPY WITH BIOPSY Right 07/29/2016  ?  Procedure: CYSTOSCOPY WITH BIOPSY;  Surgeon: Hollice Espy, MD;  Location: [facility];  Service: Urology;  Laterality: Right;  ?? CYSTOSCOPY WITH BIOPSY Right 02/09/2019  ? Procedure: CYSTOSCOPY WITH BIOPSY;  Surgeon: Abbie Sons, MD;  Location: [facility];  Service: Urology;  Laterality: Right;  ?? CYSTOSCOPY WITH STENT PLACEMENT Right 06/28/2016  ? Procedure: CYSTOSCOPY WITH STENT PLACEMENT;  Surgeon: Hollice Espy, MD;  Location: [facility];  Service: Urology;  Laterality: Right;  ?? CYSTOSCOPY WITH STENT PLACEMENT Right 02/09/2019  ? Procedure: CYSTOSCOPY WITH STENT PLACEMENT;  Surgeon: Abbie Sons, MD;  Location: [facility];  Service: Urology;  Laterality: Right;  ?? CYSTOSCOPY/URETEROSCOPY/HOLMIUM LASER/STENT PLACEMENT Right 12/26/2017  ? Procedure: CYSTOSCOPY/URETEROSCOPY/HOLMIUM LASER/STENT PLACEMENT;  Surgeon: Abbie Sons, MD;  Location: [facility];  Service: Urology;  Laterality: Right;  ?? EXTRACORPOREAL SHOCK WAVE LITHOTRIPSY Right 12/04/2017  ? Procedure: EXTRACORPOREAL SHOCK WAVE LITHOTRIPSY (ESWL);  Surgeon: Hollice Espy, MD;  Location: [facility];  Service: Urology;  Laterality: Right;  ?? PROSTATECTOMY    ?? URETEROSCOPY Right 07/29/2016  ? Procedure: URETEROSCOPY;  Surgeon: Hollice Espy, MD;  Location: [facility];  Service: Urology;  Laterality: Right;  ?? URETEROSCOPY Right 02/09/2019  ? Procedure: URETEROSCOPY- DIAGNOSTIC;  Surgeon: Abbie Sons, MD;  Location: [facility];  Service: Urology;  Laterality: Right;  ?? VIDEO BRONCHOSCOPY WITH ENDOBRONCHIAL NAVIGATION Left 03/15/2022  ? Procedure: VIDEO BRONCHOSCOPY WITH ENDOBRONCHIAL NAVIGATION;  Surgeon: Ottie Glazier, MD;  Location: [facility];  Service: Thoracic;  Laterality: Left;  ? ? ? ?FAMILY HISTORY  ? ?Family History  ?Problem Relation Age of Onset  ?? Stroke Mother   ?? Heart attack Father   ?? Lung cancer Father   ?? Kidney disease Father   ?? Prostate cancer Neg Hx   ?? Bladder Cancer Neg Hx   ? ? ? ?SOCIAL HISTORY  ? ?Social History   ? ?Tobacco Use  ?? Smoking status: Some Days  ?  Packs/day: 0.50  ?  Types: Cigarettes  ?? Smokeless tobacco: Never  ?Vaping Use  ?? Vaping Use: Never used  ?Substance Use Topics  ?? Alcohol use: Yes  ?  Alcohol/week: 14.0 standard drinks  ?  Types: 14 Cans of beer per week  ?? Drug use: Yes  ?  Types: Marijuana  ?  Comment: "sometimes, not very often"  ? ? ? ?MEDICATIONS  ? ? ?Home Medication:  ?Current Outpatient Rx  ?? Order #: [id number] Class: No Print  ?? Order #: [id number] Class: No Print  ?? Order #: [id number] Class: No Print  ?? Order #: [id number] Class: No Print  ? ?  ?Current Medication: ? ?Current Facility-Administered Medications:  ??  acetaminophen (TYLENOL) tablet 650 mg, 650 mg, Oral, Q6H PRN, 650 mg at 03/16/22 0548 **OR** acetaminophen (TYLENOL) suppository 650 mg, 650 mg, Rectal, Q6H PRN, Mansy, Jan A, MD ??  albuterol (PROVENTIL) (2.5 MG/3ML) 0.083% nebulizer solution 3 mL, 3 mL, Inhalation, Q4H PRN, Nicole Kindred A, DO, 3 mL at 03/19/22 0809 ??  apixaban (ELIQUIS) tablet 10 mg, 10 mg, Oral, BID, 10 mg at 03/21/22 0938 **FOLLOWED BY** [START ON 03/25/2022] apixaban (ELIQUIS) tablet 5 mg, 5 mg, Oral, BID, Wynelle Cleveland, RPH ??  atorvastatin (LIPITOR) tablet 80 mg, 80 mg, Oral, Daily, Mansy, Jan A, MD, 80 mg at 03/21/22 0865 ??  bisacodyl (DULCOLAX) EC tablet 5 mg, 5 mg, Oral, Daily PRN, Nicole Kindred A, DO, 5 mg at 03/14/22 1001 ??  chlorhexidine (PERIDEX) 0.12 % solution 15 mL, 15 mL, Mouth Rinse, BID, Wouk, Ailene Rud, MD, 15 mL at 03/20/22 2201 ??  Chlorhexidine Gluconate Cloth 2 % PADS 6 each, 6 each, Topical, Q0600, Gwynne Edinger, MD, 6 each at 03/21/22 0555 ??  Glycerin (Adult) 2 g suppository 1 suppository, 1 suppository, Rectal, Daily PRN, Si Raider, Ailene Rud, MD, 1 suppository at 03/18/22 1225 ??  ipratropium-albuterol (DUONEB) 0.5-2.5 (3) MG/3ML nebulizer solution 3 mL, 3 mL, Nebulization, Q6H PRN, Ottie Glazier, MD, 3 mL at 03/17/22 2127 ??  MEDLINE mouth rinse, 15 mL, Mouth  Rinse, q12n4p, Wouk, Ailene Rud, MD, 15 mL at 03/20/22 1646 ??  polyethylene glycol (MIRALAX / GLYCOLAX) packet 17 g, 17 g, Oral, Daily, Nicole Kindred A, DO, 17 g at 03/20/22 0950 ??  predniSONE (DELTAS

## 2022-03-22 ENCOUNTER — Inpatient Hospital Stay: Payer: Medicare Other

## 2022-03-22 ENCOUNTER — Ambulatory Visit: Payer: Medicare Other

## 2022-03-22 ENCOUNTER — Other Ambulatory Visit: Payer: Self-pay

## 2022-03-22 DIAGNOSIS — I5033 Acute on chronic diastolic (congestive) heart failure: Secondary | ICD-10-CM | POA: Diagnosis not present

## 2022-03-22 DIAGNOSIS — I2699 Other pulmonary embolism without acute cor pulmonale: Secondary | ICD-10-CM | POA: Diagnosis not present

## 2022-03-22 DIAGNOSIS — C3412 Malignant neoplasm of upper lobe, left bronchus or lung: Secondary | ICD-10-CM | POA: Diagnosis not present

## 2022-03-22 LAB — RAD ONC ARIA SESSION SUMMARY
Course Elapsed Days: 0
Plan Fractions Treated to Date: 1
Plan Prescribed Dose Per Fraction: 2 Gy
Plan Total Fractions Prescribed: 20
Plan Total Prescribed Dose: 40 Gy
Reference Point Dosage Given to Date: 2 Gy
Reference Point Session Dosage Given: 2 Gy
Session Number: 1

## 2022-03-22 MED ORDER — FLUTICASONE PROPIONATE 50 MCG/ACT NA SUSP
2.0000 | Freq: Every day | NASAL | Status: DC
  Administered 2022-03-22 – 2022-03-26 (×5): 2 via NASAL
  Filled 2022-03-22: qty 16

## 2022-03-22 NOTE — Progress Notes (Signed)
? ? ? ?PULMONOLOGY ? ? ? ? ? ? ? ? ?Date: 03/22/2022,   ?MRN# [id number] Edwin Donovan. 1946/07/03 ? ? ?  ?AdmissionWeight: (!) 141.5 kg                 ?CurrentWeight: 126.9 kg ? ?Referring provider: Dr. Arbutus Ped ? ? ?CHIEF COMPLAINT:  ? ?Nonmassive PE with RUL nodule and mediastinal lymphadenopathy ? ? ?HISTORY OF PRESENT ILLNESS  ? ?This is a pleasant 76 year old male with a history of prostate CA, disorder, arthritis, CHF, CKD COPD, dysrhythmia history of nephrolithiasis and essential previous episode of pneumonia in 2017, he reports worsening cough and wheezing.  He was found to be mildly tachypneic on arrival and required 3 L of supplemental oxygen to reach normoxia at SPO2 95%.  Labs showed CKD however troponins were essentially normal in the context of CKD with only mild elevation.  Influenza and COVID-19 are negative.  CTA was performed with findings of subsegmental pulmonary emboli as well as 17 mm spiculated nodule of the right upper lobe and associated mediastinal lymphadenopathy/mass.  He had nephrectomy in the past due to renal cell carcinoma.  He quit smoking 2 years ago. PCCM consultation was placed for additional evaluation and management.  ? ? ?03/17/22- patient is improved, he is off BIPAP. He is speaking and answering appropriately to verbal communication. Have dcd IVF and tessalon.  Have ordered recruitment maneuvers with metaneb. CT chest today to eval lung as suggested by previous radiology report.  ? ?03/18/22 - patient is improved from respiratory perspective, he is on 6L/min and is speaking full sentences. VBG repeat today. Will start on eliquis from heparin ?03/19/22- patient has biopsy results from bronchoscopy with +small cell lung cancer and N1 nodal metastasis. He is on 6L/min [place] with Acute PE currently on eliquis. He is slowly improving clinically and is appreciative of care. I met with his [facility] doctor today and we briefly reviewed medical plan.  ?Mar 20, 2022-patient's status post CT  abdomen pelvis with no newconcerning findings. He is more lucid and talktative today.we discussed his lung cancer diagnosis and he states Dr Janese Banks has already reviewed chemotherapy with him.  ?03/21/2022-patient seems to be improved clinically.  He is able to communicate without labored breathing however does have hoarseness of voice and shares that he did not sleep well.  His procalcitonin is negative.  Were planning on doing a repeat chest x-ray after performing recruitment maneuvers to review interval changes.  Also get venous blood gas for interval changes.  He does have a BiPAP ordered for home use and [facility] is working on this.  Status post medical oncology evaluation with plan to initiate chemotherapy and possible radiation.  We will attempt to wean down oxygen level with goal of SPO2 88% or higher. ? ?03/22/22- Patient is resting in bed in no distress, with spO2 97%. He is with increased left pleural effusion and complete left hemithorax opacification. Have ordered US IR thoracentesis with cytologic evaluation.  ? ?PAST MEDICAL HISTORY  ? ?Past Medical History:  ?Diagnosis Date  ?? Anxiety   ? ptsd  ?? Arthritis   ?? Cancer [facility])   ? PROSTATE  ?? CHF (congestive heart failure) ([place])   ?? Chronic kidney disease   ? stones  ?? COPD (chronic obstructive pulmonary disease) ([place])   ?? Dysrhythmia   ?? History of kidney stones   ?? Hypertension   ?? Kidney stone   ?? Pneumonia   ? 06/27/16  ??  Shortness of breath dyspnea   ? ? ? ?SURGICAL HISTORY  ? ?Past Surgical History:  ?Procedure Laterality Date  ?? CYSTOSCOPY W/ RETROGRADES Bilateral 12/26/2017  ? Procedure: CYSTOSCOPY WITH RETROGRADE PYELOGRAM;  Surgeon: Abbie Sons, MD;  Location: [facility] OR[facility];  Service: Urology;  Laterality: Bilateral;  ?? CYSTOSCOPY W/ RETROGRADES Right 02/09/2019  ? Procedure: CYSTOSCOPY WITH RETROGRADE PYELOGRAM;  Surgeon: Abbie Sons, MD;  Location: [facility];  Service: Urology;  Laterality: Right;  ?? CYSTOSCOPY W/ URETERAL STENT  PLACEMENT Right 07/29/2016  ? Procedure: CYSTOSCOPY WITH STENT REPLACEMENT;  Surgeon: Hollice Espy, MD;  Location: [facility];  Service: Urology;  Laterality: Right;  ?? CYSTOSCOPY WITH BIOPSY Right 07/29/2016  ? Procedure: CYSTOSCOPY WITH BIOPSY;  Surgeon: Hollice Espy, MD;  Location: [facility];  Service: Urology;  Laterality: Right;  ?? CYSTOSCOPY WITH BIOPSY Right 02/09/2019  ? Procedure: CYSTOSCOPY WITH BIOPSY;  Surgeon: Abbie Sons, MD;  Location: [facility];  Service: Urology;  Laterality: Right;  ?? CYSTOSCOPY WITH STENT PLACEMENT Right 06/28/2016  ? Procedure: CYSTOSCOPY WITH STENT PLACEMENT;  Surgeon: Hollice Espy, MD;  Location: [facility];  Service: Urology;  Laterality: Right;  ?? CYSTOSCOPY WITH STENT PLACEMENT Right 02/09/2019  ? Procedure: CYSTOSCOPY WITH STENT PLACEMENT;  Surgeon: Abbie Sons, MD;  Location: [facility];  Service: Urology;  Laterality: Right;  ?? CYSTOSCOPY/URETEROSCOPY/HOLMIUM LASER/STENT PLACEMENT Right 12/26/2017  ? Procedure: CYSTOSCOPY/URETEROSCOPY/HOLMIUM LASER/STENT PLACEMENT;  Surgeon: Abbie Sons, MD;  Location: [facility];  Service: Urology;  Laterality: Right;  ?? EXTRACORPOREAL SHOCK WAVE LITHOTRIPSY Right 12/04/2017  ? Procedure: EXTRACORPOREAL SHOCK WAVE LITHOTRIPSY (ESWL);  Surgeon: Hollice Espy, MD;  Location: [facility];  Service: Urology;  Laterality: Right;  ?? PROSTATECTOMY    ?? URETEROSCOPY Right 07/29/2016  ? Procedure: URETEROSCOPY;  Surgeon: Hollice Espy, MD;  Location: [facility];  Service: Urology;  Laterality: Right;  ?? URETEROSCOPY Right 02/09/2019  ? Procedure: URETEROSCOPY- DIAGNOSTIC;  Surgeon: Abbie Sons, MD;  Location: [facility];  Service: Urology;  Laterality: Right;  ?? VIDEO BRONCHOSCOPY WITH ENDOBRONCHIAL NAVIGATION Left 03/15/2022  ? Procedure: VIDEO BRONCHOSCOPY WITH ENDOBRONCHIAL NAVIGATION;  Surgeon: Ottie Glazier, MD;  Location: [facility];  Service: Thoracic;  Laterality: Left;  ? ? ? ?FAMILY HISTORY  ? ?Family History  ?Problem  Relation Age of Onset  ?? Stroke Mother   ?? Heart attack Father   ?? Lung cancer Father   ?? Kidney disease Father   ?? Prostate cancer Neg Hx   ?? Bladder Cancer Neg Hx   ? ? ? ?SOCIAL HISTORY  ? ?Social History  ? ?Tobacco Use  ?? Smoking status: Some Days  ?  Packs/day: 0.50  ?  Types: Cigarettes  ?? Smokeless tobacco: Never  ?Vaping Use  ?? Vaping Use: Never used  ?Substance Use Topics  ?? Alcohol use: Yes  ?  Alcohol/week: 14.0 standard drinks  ?  Types: 14 Cans of beer per week  ?? Drug use: Yes  ?  Types: Marijuana  ?  Comment: "sometimes, not very often"  ? ? ? ?MEDICATIONS  ? ? ?Home Medication:  ?Current Outpatient Rx  ?? Order #: [id number] Class: No Print  ?? Order #: [id number] Class: No Print  ?? Order #: [id number] Class: No Print  ?? Order #: [id number] Class: No Print  ? ?  ?Current Medication: ? ?Current Facility-Administered Medications:  ??  acetaminophen (TYLENOL) tablet 650 mg, 650 mg, Oral, Q6H PRN, 650 mg at 03/16/22 0548 **OR** acetaminophen (TYLENOL) suppository 650 mg, 650 mg, Rectal, Q6H PRN,  Mansy, Jan A, MD ??  albuterol (PROVENTIL) (2.5 MG/3ML) 0.083% nebulizer solution 3 mL, 3 mL, Inhalation, Q4H PRN, Nicole Kindred A, DO, 3 mL at 03/19/22 0809 ??  apixaban (ELIQUIS) tablet 10 mg, 10 mg, Oral, BID, 10 mg at 03/22/22 0829 **FOLLOWED BY** [START ON 03/25/2022] apixaban (ELIQUIS) tablet 5 mg, 5 mg, Oral, BID, Wynelle Cleveland, RPH ??  atorvastatin (LIPITOR) tablet 80 mg, 80 mg, Oral, Daily, Mansy, Jan A, MD, 80 mg at 03/22/22 2518 ??  bisacodyl (DULCOLAX) EC tablet 5 mg, 5 mg, Oral, Daily PRN, Nicole Kindred A, DO, 5 mg at 03/14/22 1001 ??  chlorhexidine (PERIDEX) 0.12 % solution 15 mL, 15 mL, Mouth Rinse, BID, Wouk, Ailene Rud, MD, 15 mL at 03/22/22 0830 ??  Chlorhexidine Gluconate Cloth 2 % PADS 6 each, 6 each, Topical, Q0600, Gwynne Edinger, MD, 6 each at 03/22/22 0549 ??  Glycerin (Adult) 2 g suppository 1 suppository, 1 suppository, Rectal, Daily PRN, Si Raider, Ailene Rud, MD, 1  suppository at 03/18/22 1225 ??  ipratropium-albuterol (DUONEB) 0.5-2.5 (3) MG/3ML nebulizer solution 3 mL, 3 mL, Nebulization, Q6H PRN, Ottie Glazier, MD, 3 mL at 03/17/22 2127 ??  MEDLINE mouth rinse, 15 mL, Mo

## 2022-03-22 NOTE — Progress Notes (Signed)
OT Cancellation Note ? ?Patient Details ?Name: Edwin Donovan. ?MRN: [id number] ?DOB: Jan 20, 1946 ? ? ?Cancelled Treatment:    Reason Eval/Treat Not Completed: Patient at procedure or test/ unavailable;Other (comment) (per RN Lonn Georgia pt of floor at this time, OT will re-attempt as able) ? ?Shanon Payor, OTD OTR/L  ?03/22/22, 3:40 PM  ?

## 2022-03-22 NOTE — Procedures (Signed)
Patient presented to [facility] Ultrasound department today for possible thoracentesis. Limited ultrasound examination of the left lung fields revealed insufficient fluid for safe percutaneous access. No procedure performed. Dr. Dwaine Gale made aware. Images saved in Epic. Please re-consult IR if future imaging warrants re-evaluation.  ? Edwin Donovan, AGACNP-BC ?[phone] ?03/22/2022, 3:10 PM ? ? ?

## 2022-03-22 NOTE — Progress Notes (Addendum)
?  Progress Note ? ? ?Patient: Edwin Donovan. DJT:[id number] DOB: 1946/10/25 DOA: 03/09/2022     13 ?DOS: the patient was seen and examined on 03/22/2022 ?  ?Brief hospital course: ?Brandy "Rush Landmark" Kashmere Staffa. is a 76 y.o. male with past medical history COPD not on home oxygen prior to recent hospital d/c on 3/00/92, chronic diastolic CHF, hypertension and CKD stage II with hx or Right nephrectomy.  He presented to the ED  with progressively worsening lower extremity edema, significant orthopnea, dyspnea worsening on exertion over the last couple weeks, in addition to worsening cough with wheezing.   ? ?Chest CTA  - two isolated subsegmental pulmonary emboli at the bifurcation in the right upper lobe pulmonary artery. Overall clot burden is very small.    ?17 mm spiculated nodule in the posterior right upper lobe.  Associated 10.0 x 7.4 cm left mediastinal mass extending into the medial left upper lobe and left perihilar region. This appearance raises concern for primary bronchogenic neoplasm such as small cell lung cancer. ?Patient developed acute respiratory failure requiring BiPAP and high flow oxygen.  This is mainly due to lung cancer ? ?Oncology decided that patient will complete radiation therapy while in the hospital. ? ? ?Assessment and Plan: ?Acute hypoxemic respiratory failure. ?COPD with acute exacerbation. ?Acute on chronic diastolic congestive heart failure. ?Acute pulmonary emboli ?Small cell lung cancer. ?The pleural effusion most likely malignant pleural effusion. ?Condition relative stable, no longer has any volume overload.  No evidence of bacterial infection.  No bronchospasm.  Hypoxemia now mainly from small cell lung cancer.  Patient starting radiation therapy today.   ?Pulmonology has ordered a thoracentesis. ? ?Urinary retention. ?Constipation. ?Condition improved.. ?  ?Chronic kidney disease stage IIIa. ?Acute kidney injury ruled out. ?History of renal cancer status post  nephrostomy. ?Renal function still stable. ? ?Prostate cancer. ?Follow-up with PCP as outpatient. ? ?SLE. ?Follow for now.  ? ? ?  ? ?Subjective:  ?Patient condition relatively stable, 6 L oxygen daytime, 50% oxygen with BiPAP at night.  Short of breath with exertion, no cough ? ?Physical Exam: ?Vitals:  ? 03/22/22 0500 03/22/22 0840 03/22/22 0900 03/22/22 1118  ?BP:   103/83 119/78  ?Pulse:   99 (!) 106  ?Resp:   18 (!) 22  ?Temp:   97.9 ?F (36.6 ?C) 98 ?F (36.7 ?C)  ?TempSrc:   Axillary Oral  ?SpO2:  95% 96% 92%  ?Weight: 126.9 kg     ?Height:      ? ?General exam: Appears calm and comfortable  ?Respiratory system: Clear to auscultation. Respiratory effort normal. ?Cardiovascular system: S1 & S2 heard, RRR. No JVD, murmurs, rubs, gallops or clicks. No pedal edema. ?Gastrointestinal system: Abdomen is nondistended, soft and nontender. No organomegaly or masses felt. Normal bowel sounds heard. ?Central nervous system: Alert and oriented. No focal neurological deficits. ?Extremities: Symmetric 5 x 5 power. ?Skin: No rashes, lesions or ulcers ?Psychiatry: Judgement and insight appear normal. Mood & affect appropriate.  ? ?Data Reviewed: ? ?No new ? ?Family Communication: Significant other updated at bedside. ? ?Disposition: ?Status is: Inpatient ?Remains inpatient appropriate because: Severity of disease. ? Planned Discharge Destination: Skilled nursing facility ? ? ? ?Time spent: 27 minutes ? ?Author: ?Sharen Hones, MD ?03/22/2022 12:22 PM ? ?For on call review [email or web address].  ?

## 2022-03-23 DIAGNOSIS — J441 Chronic obstructive pulmonary disease with (acute) exacerbation: Secondary | ICD-10-CM | POA: Diagnosis not present

## 2022-03-23 DIAGNOSIS — I2699 Other pulmonary embolism without acute cor pulmonale: Secondary | ICD-10-CM | POA: Diagnosis not present

## 2022-03-23 DIAGNOSIS — C3412 Malignant neoplasm of upper lobe, left bronchus or lung: Secondary | ICD-10-CM | POA: Diagnosis not present

## 2022-03-23 DIAGNOSIS — R338 Other retention of urine: Secondary | ICD-10-CM

## 2022-03-23 DIAGNOSIS — I5033 Acute on chronic diastolic (congestive) heart failure: Secondary | ICD-10-CM | POA: Diagnosis not present

## 2022-03-23 DIAGNOSIS — J9811 Atelectasis: Secondary | ICD-10-CM

## 2022-03-23 LAB — BASIC METABOLIC PANEL
Anion gap: 8 (ref 5–15)
BUN: 58 mg/dL — ABNORMAL HIGH (ref 8–23)
CO2: 29 mmol/L (ref 22–32)
Calcium: 8.9 mg/dL (ref 8.9–10.3)
Chloride: 98 mmol/L (ref 98–111)
Creatinine, Ser: 1.55 mg/dL — ABNORMAL HIGH (ref 0.61–1.24)
GFR, Estimated: 46 mL/min — ABNORMAL LOW (ref >60)
Glucose, Bld: 103 mg/dL — ABNORMAL HIGH (ref 70–99)
Potassium: 4.7 mmol/L (ref 3.5–5.1)
Sodium: 135 mmol/L (ref 135–145)

## 2022-03-23 LAB — BRAIN NATRIURETIC PEPTIDE: B Natriuretic Peptide: 17.6 pg/mL (ref 0.0–100.0)

## 2022-03-23 MED ORDER — SALINE SPRAY 0.65 % NA SOLN
1.0000 | NASAL | Status: DC | PRN
  Filled 2022-03-23: qty 44

## 2022-03-23 NOTE — Progress Notes (Signed)
?Progress Note ? ? ?Patient: Edwin Donovan. [id number]:[id number] DOB: 09/13/1946 DOA: 03/09/2022     14 ?DOS: the patient was seen and examined on 03/23/2022 ?  ?Brief hospital course: ?Fread "Rush Landmark" Dawsyn Ramsaran. is a 76 y.o. male with past medical history COPD not on home oxygen prior to recent hospital d/c on 8/50/27, chronic diastolic CHF, hypertension and CKD stage II with hx or Right nephrectomy.  He presented to the ED  with progressively worsening lower extremity edema, significant orthopnea, dyspnea worsening on exertion over the last couple weeks, in addition to worsening cough with wheezing.   ? ?Chest CTA  - two isolated subsegmental pulmonary emboli at the bifurcation in the right upper lobe pulmonary artery. Overall clot burden is very small.    ?17 mm spiculated nodule in the posterior right upper lobe.  Associated 10.0 x 7.4 cm left mediastinal mass extending into the medial left upper lobe and left perihilar region. This appearance raises concern for primary bronchogenic neoplasm such as small cell lung cancer. ?Patient developed acute respiratory failure requiring BiPAP and high flow oxygen.  This is mainly due to lung cancer ? ?Oncology decided that patient will complete radiation therapy while in the hospital. ? ? ?Assessment and Plan: ?Acute hypoxemic respiratory failure. ?COPD with acute exacerbation. ?Acute on chronic diastolic congestive heart failure. ?Acute pulmonary emboli ?Small cell lung cancer. ?Left lung atelectasis ?Patient is still requiring high flow oxygen.  Chest x-ray showed whitening out left lung.  Ultrasound did not show significant pleural effusion.  This most likely due to atelectasis, probably from left-sided bronc obstruction from cancer. ?Patient oxygenation has not been worse today, will continue radiation therapy. ?Rechecked BNP was normal, patient does not have current volume overload. ? ?  ?Urinary retention. ?Constipation. ?Condition improved.. ?  ?Chronic kidney  disease stage IIIa. ?Acute kidney injury ruled out. ?History of renal cancer status post nephrostomy. ?Renal function still stable. ? ?Prostate cancer. ?Follow-up with PCP as outpatient. ? ?SLE. ?Follow for now.  ? ?Morbid obesity. ?Patient has BMI 39.02 with associated congestive heart failure ? ? ?  ? ?Subjective:  ?Patient still on high flow oxygen and BiPAP at nighttime. ?Feels same as yesterday with baseline shortness of breath.  Had a bowel movement without nausea vomiting. ? ? ?Physical Exam: ?Vitals:  ? 03/22/22 2136 03/22/22 2330 03/23/22 0400 03/23/22 0745  ?BP:  108/71  124/76  ?Pulse: (!) 104 94  100  ?Resp: (!) 25 19  20   ?Temp:  (!) 97.5 ?F (36.4 ?C) (!) 97.5 ?F (36.4 ?C) 98.1 ?F (36.7 ?C)  ?TempSrc:  Oral Oral Oral  ?SpO2: 99% 99%    ?Weight:      ?Height:      ? ?General exam: Appears calm and comfortable  ?Respiratory system: Decreased breath sounds. Respiratory effort normal. ?Cardiovascular system: S1 & S2 heard, RRR. No JVD, murmurs, rubs, gallops or clicks. No pedal edema. ?Gastrointestinal system: Abdomen is nondistended, soft and nontender. No organomegaly or masses felt. Normal bowel sounds heard. ?Central nervous system: Alert and oriented. No focal neurological deficits. ?Extremities: Symmetric 5 x 5 power. ?Skin: No rashes, lesions or ulcers ?Psychiatry: Judgement and insight appear normal. Mood & affect appropriate.  ? ?Data Reviewed: ? ?Results reviewed ? ?Family Communication:  ? ?Disposition: ?Status is: Inpatient ?Remains inpatient appropriate because: Severity of disease, inpatient treatment and procedure. ? Planned Discharge Destination: Skilled nursing facility ? ? ? ?Time spent: 34 minutes ? ?Author: ?Sharen Hones, MD ?03/23/2022 12:38 PM ? ?  For on call review [email or web address]  ?

## 2022-03-24 DIAGNOSIS — I2699 Other pulmonary embolism without acute cor pulmonale: Secondary | ICD-10-CM | POA: Diagnosis not present

## 2022-03-24 DIAGNOSIS — C3412 Malignant neoplasm of upper lobe, left bronchus or lung: Secondary | ICD-10-CM | POA: Diagnosis not present

## 2022-03-24 DIAGNOSIS — I5033 Acute on chronic diastolic (congestive) heart failure: Secondary | ICD-10-CM | POA: Diagnosis not present

## 2022-03-24 NOTE — Progress Notes (Signed)
?Progress Note ? ? ?Patient: Edwin Donovan. [id number]:[id number] DOB: 07/01/1946 DOA: 03/09/2022     15 ?DOS: the patient was seen and examined on 03/24/2022 ?  ?Brief hospital course: ?Edwin Donovan. is a 76 y.o. male with past medical history COPD not on home oxygen prior to recent hospital d/c on 06/03/95, chronic diastolic CHF, hypertension and CKD stage II with hx or Right nephrectomy.  He presented to the ED  with progressively worsening lower extremity edema, significant orthopnea, dyspnea worsening on exertion over the last couple weeks, in addition to worsening cough with wheezing.   ? ?Chest CTA  - two isolated subsegmental pulmonary emboli at the bifurcation in the right upper lobe pulmonary artery. Overall clot burden is very small.    ?17 mm spiculated nodule in the posterior right upper lobe.  Associated 10.0 x 7.4 cm left mediastinal mass extending into the medial left upper lobe and left perihilar region. This appearance raises concern for primary bronchogenic neoplasm such as small cell lung cancer. ?Patient developed acute respiratory failure requiring BiPAP and high flow oxygen.  This is mainly due to lung cancer ? ?Oncology decided that patient will complete radiation therapy while in the hospital. ? ?Repeated chest x-ray on 5/5 showed complete widening of left lung, ultrasound did not show significant fluids.  This is a secondary to atelectasis with potential obstruction of airway from cancer.  Continue radiation therapy ? ? ?Assessment and Plan: ?Acute hypoxemic respiratory failure. ?COPD with acute exacerbation. ?Acute on chronic diastolic congestive heart failure. ?Acute pulmonary emboli ?Small cell lung cancer. ?Left lung atelectasis ? Chest x-ray showed whitening out left lung.  Ultrasound did not show significant pleural effusion.  This most likely due to atelectasis, probably from left-sided bronc obstruction from cancer. ?Patient does not seem to have any secondary infection  or volume overload this time.  Continue anticoagulation, continue radiation therapy.  Patient hypoxemia is serious, but are relatively stable. ?  ?  ?Urinary retention. ?Constipation. ?Condition improved.. ?  ?Chronic kidney disease stage IIIa. ?Acute kidney injury ruled out. ?History of renal cancer status post nephrostomy. ?Recheck of the BMP showed a relatively stable renal function. ? ?Prostate cancer. ?Follow-up with PCP as outpatient. ? ?SLE. ?Follow-up with PCP as outpatient ?  ?Morbid obesity. ?Patient has BMI 39.02 with associated congestive heart failure ? ? ?  ? ?Subjective:  ?Patient had a better appetite yesterday, better p.o. intake.  Still short of breath with exertion, still on high flow oxygen and BiPAP at nighttime. ? ? ?Physical Exam: ?Vitals:  ? 03/23/22 2308 03/24/22 0000 03/24/22 0100 03/24/22 0727  ?BP:  119/80  107/78  ?Pulse: (!) 103 95 95 (!) 110  ?Resp: (!) 24 19 18 19   ?Temp:  97.9 ?F (36.6 ?C)  98 ?F (36.7 ?C)  ?TempSrc:  Oral  Oral  ?SpO2: 97% 98% 99% 91%  ?Weight:      ?Height:      ? ?General exam: Appears calm and comfortable, ill appearing ?Respiratory system: Decreased breath sounds on left. Respiratory effort normal. ?Cardiovascular system: S1 & S2 heard, RRR. No JVD, murmurs, rubs, gallops or clicks. No pedal edema. ?Gastrointestinal system: Abdomen is nondistended, soft and nontender. No organomegaly or masses felt. Normal bowel sounds heard. ?Central nervous system: Alert and oriented. No focal neurological deficits. ?Extremities: Symmetric 5 x 5 power. ?Skin: No rashes, lesions or ulcers ?Psychiatry: Judgement and insight appear normal. Mood & affect appropriate.  ? ?Data Reviewed: ? ?Lab results reviewed ? ?  Family Communication: Only has friend ? ?Disposition: ?Status is: Inpatient ?Remains inpatient appropriate because: Severity of disease, needing radiation therapy. ? Planned Discharge Destination: Skilled nursing facility ? ? ? ?Time spent: 35 minutes ? ?Author: ?Sharen Hones, MD ?03/24/2022 10:37 AM ? ?For on call review [email or web address]  ?

## 2022-03-24 NOTE — Evaluation (Signed)
Clinical/Bedside Swallow Evaluation ?Patient Details  ?Name: Edwin Donovan. ?MRN: [id number] ?Date of Birth: 03/30/1946 ? ?Today's Date: 03/24/2022 ?Time: SLP Start Time (ACUTE ONLY): 7628 SLP Stop Time (ACUTE ONLY): 0910 ?SLP Time Calculation (min) (ACUTE ONLY): 20 min ? ?Past Medical History:  ?Past Medical History:  ?Diagnosis Date  ? Anxiety   ? ptsd  ? Arthritis   ? Cancer [facility])   ? PROSTATE  ? CHF (congestive heart failure) ([place])   ? Chronic kidney disease   ? stones  ? COPD (chronic obstructive pulmonary disease) ([place])   ? Dysrhythmia   ? History of kidney stones   ? Hypertension   ? Kidney stone   ? Pneumonia   ? 06/27/16  ? Shortness of breath dyspnea   ? ?Past Surgical History:  ?Past Surgical History:  ?Procedure Laterality Date  ? CYSTOSCOPY W/ RETROGRADES Bilateral 12/26/2017  ? Procedure: CYSTOSCOPY WITH RETROGRADE PYELOGRAM;  Surgeon: Abbie Sons, MD;  Location: [facility];  Service: Urology;  Laterality: Bilateral;  ? CYSTOSCOPY W/ RETROGRADES Right 02/09/2019  ? Procedure: CYSTOSCOPY WITH RETROGRADE PYELOGRAM;  Surgeon: Abbie Sons, MD;  Location: [facility];  Service: Urology;  Laterality: Right;  ? CYSTOSCOPY W/ URETERAL STENT PLACEMENT Right 07/29/2016  ? Procedure: CYSTOSCOPY WITH STENT REPLACEMENT;  Surgeon: Hollice Espy, MD;  Location: [facility];  Service: Urology;  Laterality: Right;  ? CYSTOSCOPY WITH BIOPSY Right 07/29/2016  ? Procedure: CYSTOSCOPY WITH BIOPSY;  Surgeon: Hollice Espy, MD;  Location: [facility];  Service: Urology;  Laterality: Right;  ? CYSTOSCOPY WITH BIOPSY Right 02/09/2019  ? Procedure: CYSTOSCOPY WITH BIOPSY;  Surgeon: Abbie Sons, MD;  Location: [facility];  Service: Urology;  Laterality: Right;  ? CYSTOSCOPY WITH STENT PLACEMENT Right 06/28/2016  ? Procedure: CYSTOSCOPY WITH STENT PLACEMENT;  Surgeon: Hollice Espy, MD;  Location: [facility];  Service: Urology;  Laterality: Right;  ? CYSTOSCOPY WITH STENT PLACEMENT Right 02/09/2019  ? Procedure: CYSTOSCOPY WITH  STENT PLACEMENT;  Surgeon: Abbie Sons, MD;  Location: [facility];  Service: Urology;  Laterality: Right;  ? CYSTOSCOPY/URETEROSCOPY/HOLMIUM LASER/STENT PLACEMENT Right 12/26/2017  ? Procedure: CYSTOSCOPY/URETEROSCOPY/HOLMIUM LASER/STENT PLACEMENT;  Surgeon: Abbie Sons, MD;  Location: [facility];  Service: Urology;  Laterality: Right;  ? EXTRACORPOREAL SHOCK WAVE LITHOTRIPSY Right 12/04/2017  ? Procedure: EXTRACORPOREAL SHOCK WAVE LITHOTRIPSY (ESWL);  Surgeon: Hollice Espy, MD;  Location: [facility];  Service: Urology;  Laterality: Right;  ? PROSTATECTOMY    ? URETEROSCOPY Right 07/29/2016  ? Procedure: URETEROSCOPY;  Surgeon: Hollice Espy, MD;  Location: [facility];  Service: Urology;  Laterality: Right;  ? URETEROSCOPY Right 02/09/2019  ? Procedure: URETEROSCOPY- DIAGNOSTIC;  Surgeon: Abbie Sons, MD;  Location: [facility];  Service: Urology;  Laterality: Right;  ? VIDEO BRONCHOSCOPY WITH ENDOBRONCHIAL NAVIGATION Left 03/15/2022  ? Procedure: VIDEO BRONCHOSCOPY WITH ENDOBRONCHIAL NAVIGATION;  Surgeon: Ottie Glazier, MD;  Location: [facility];  Service: Thoracic;  Laterality: Left;  ? ?HPI:  ?Per H&P, 03/09/22, "Edwin Donovan. is a 76 y.o. male with medical history significant for COPD, CHF, hypertension and CKD, who presented to the ER with acute onset of worsening lower extremity edema with associated orthopnea and dyspnea worsening on exertion over the last couple weeks.  He admitted to worsening cough as well as wheezing.  His symptoms have been getting significantly worse over the last couple of days.  No fever or chills.  No nausea or vomiting or abdominal pain.  He denies any chest pain or palpitations.  No dysuria, oliguria or hematuria or flank pain.  No bleeding diathesis.     ED Course: Upon presentation to the ER, respiratory it was 22 and pulse currently 95% on 3 L of O2 by nasal cannula with otherwise normal vital signs.  Labs revealed a BUN of 55 and creatinine 1.62 and high-sensitivity  troponin was 30 and later 32.  CBC showed mild anemia and thrombocytopenia.  Influenza antigens and COVID-19 PCR came back negative.  Coagulation profile was within normal.  EKG as reviewed by me : Sinus rhythm with a rate of 78 with low voltage QRS  Imaging: For which x-ray showed large masslike opacity in the left hilum.  Chest CTA revealed the following:  Two isolated subsegmental pulmonary emboli at the bifurcation in the  right upper lobe pulmonary artery. Overall clot burden is very  small.     17 mm spiculated nodule in the posterior right upper lobe.  Associated 10.0 x 7.4 cm left mediastinal mass extending into the  medial left upper lobe and left perihilar region. This appearance  raises concern for primary bronchogenic neoplasm such as small cell  lung cancer.     Critical Value/emergent results were called by telephone at the time  of interpretation on 03/09/2022 at 2:46 am to provider Dr Pryor Curia, who verbally acknowledged these results.     Aortic Atherosclerosis (ICD10-I70.0) and Emphysema (ICD10-J43.9).    The patient was given DuoNebs, IV Solu-Medrol and was started on IV heparin with bolus and drip.  He is admitted to a progressive unit bed for further evaluation and management."  ?  ?Assessment / Plan / Recommendation  ?Clinical Impression ? Pt seen for clinical swallowing evaluation. Pt alert, pleasant, and cooperative. Hoarse/hypophonic vocal quality noted. On 6L/min O2 via [place]. Known to SLP services from earlier this admission. New consult received due to "not eating, pt states he gets choked when eating." ? ?Oral motor examination significant for xerostomia and functional edentulism (pt has x1 remaining tooth). Weak/hoarse, but functional, vocal quality noted. Baseline congested cough noted. ?  ?Per chart review, temp and WBC (most recent WBC 03/20/22) WNL. Most recent CXR 03/21/22 "There is almost complete opacification of left hemithorax suggesting marked increase in left pleural effusion and  possibly worsening of underlying atelectasis/pneumonia. Central pulmonary vessels are prominent. Are no signs of alveolar pulmonary edema or focal consolidation in the right lung." ? ?Pt presents with s/sx mild-moderate oral dysphagia c/b prolonged mastication of solids likely due to dental status. Increased WOB noted with mastication. Pharyngeal swallow appeared [facility] per clinical assessment. To palpation, pt with seemingly adequate laryngeal elevation and seemingly timely swallow to palpation. No overt s/sx pharyngeal dysphagia. No change to vocal quality noted across evaluation. All items given during evaluation given from meal tray. Pt endorsed difficulty chewing/swallowing with soft solid consistencies. Suspect respiratory status affecting oropharyngeal swallow function.  ?  ?Recommend diet dowgrade to puree with thin liquids with safe swallowing strategies/aspiration precautions as outlined below. Pt downgraded with the hope that pureed diet would promote improved energy conservation during meals. ?  ?Pt and RN made aware of results, recommendations, and SLP POC. Particular emphasis placed on diet modifications and safe swallowing strategies/aspiration precautions to promote eneger conservation given pt's respiratory status. Pt verbalized understanding/agreement.  ? ?Pt is at mildly increased risk for aspiration/aspiration PNA given dental status, respiratory status, and medical comorbities.  ?  ?SLP to f/u per POC for diet tolerance and trials of upgraded textures as appropriate.  ? ?  SLP Visit Diagnosis: Dysphagia, oropharyngeal phase (R13.12) ?   ?Aspiration Risk ? Mild aspiration risk  ?  ?Diet Recommendation Dysphagia 1 (Puree);Thin liquid  ? ?Medication Administration: Crushed with puree ?Supervision: Patient able to self feed ?Compensations: Minimize environmental distractions;Slow rate;Small sips/bites (rest breaks PRN for energy conservation) ?Postural Changes: Seated upright at 90 degrees;Remain upright  for at least 30 minutes after po intake  ?  ?Other  Recommendations Oral Care Recommendations: Oral care BID;Patient independent with oral care (setup)   ? ?Recommendations for follow up therapy are one

## 2022-03-24 NOTE — TOC Progression Note (Signed)
Transition of Care (TOC) - Progression Note  ? ? ?Patient Details  ?Name: Deven Furia. ?MRN: [id number] ?Date of Birth: 02-Oct-1946 ? ?Transition of Care (TOC) CM/SW Contact  ?Izola Price, RN ?Phone Number: ?03/24/2022, 10:42 AM ? ?Clinical Narrative:  5/7: Requiring Bipap and may need on discharge, but will remind in hospital for continuing radiation treatments per oncology. From [facility]. Palliative following as well. Simmie Davies RN CM  ? ? ? ?Expected Discharge Plan: [place] ?Barriers to Discharge: Continued Medical Work up ? ?Expected Discharge Plan and Services ?Expected Discharge Plan: [place] ?  ?  ?Post Acute Care Choice: [place] ?Living arrangements for the past 2 months: [place] ?Expected Discharge Date: 03/12/22               ?  ?  ?  ?  ?  ?  ?  ?  ?  ?  ? ? ?Social Determinants of Health (SDOH) Interventions ?  ? ?Readmission Risk Interventions ? ?  03/13/2022  ?  3:42 PM  ?Readmission Risk Prevention Plan  ?Transportation Screening Complete  ?PCP or Specialist Appt within 3-5 Days Complete  ?[place] or Home Care Consult Complete  ?Social Work Consult for [place] Planning/Counseling Complete  ?Palliative Care Screening Not Applicable  ?Medication Review [employer]) Complete  ? ? ?

## 2022-03-24 NOTE — Progress Notes (Signed)
PT Cancellation Note ? ?Patient Details ?Name: Edwin Donovan. ?MRN: [id number] ?DOB: 1945-11-24 ? ? ?Cancelled Treatment:    Reason Eval/Treat Not Completed: Other (comment)  Pt awake but declined session today.  No specific reason given.  Will return tomorrow. ? ? ?Chesley Noon ?03/24/2022, 12:03 PM ?

## 2022-03-25 ENCOUNTER — Ambulatory Visit: Payer: Medicare Other

## 2022-03-25 DIAGNOSIS — I5033 Acute on chronic diastolic (congestive) heart failure: Secondary | ICD-10-CM | POA: Diagnosis not present

## 2022-03-25 DIAGNOSIS — E440 Moderate protein-calorie malnutrition: Secondary | ICD-10-CM | POA: Insufficient documentation

## 2022-03-25 DIAGNOSIS — I2699 Other pulmonary embolism without acute cor pulmonale: Secondary | ICD-10-CM | POA: Diagnosis not present

## 2022-03-25 DIAGNOSIS — J9811 Atelectasis: Secondary | ICD-10-CM

## 2022-03-25 MED ORDER — ADULT MULTIVITAMIN W/MINERALS CH
1.0000 | ORAL_TABLET | Freq: Every day | ORAL | Status: DC
  Administered 2022-03-25 – 2022-03-26 (×2): 1 via ORAL
  Filled 2022-03-25 (×3): qty 1

## 2022-03-25 MED ORDER — ENSURE ENLIVE PO LIQD
237.0000 mL | Freq: Three times a day (TID) | ORAL | Status: DC
  Administered 2022-03-25 – 2022-03-27 (×5): 237 mL via ORAL

## 2022-03-25 NOTE — Progress Notes (Signed)
OT Cancellation Note ? ?Patient Details ?Name: Edwin Donovan. ?MRN: [id number] ?DOB: [birth year] ? ? ?Cancelled Treatment:    Reason Eval/Treat Not Completed: Fatigue/lethargy limiting ability to participate. Upon attempt pt with PT leaving room, having just declined PT session. Per PT pt fatigued not interested in therapy this date. Will hold OT tx at this time and re-attempt at a later time/date as available and pt medically appropriate for OT services.  ? ?Shara Blazing, M.S., OTR/L ?Ascom: [phone] ?03/25/22, 2:11 PM ? ? ?

## 2022-03-25 NOTE — Progress Notes (Signed)
PT Cancellation Note ? ?Patient Details ?Name: Edwin Donovan. ?MRN: [id number] ?DOB: 1946/03/03 ? ? ?Cancelled Treatment:    Reason Eval/Treat Not Completed: Other (comment) ? ?Offered x 2 - AM and PM today.  Declined both attempts due to fatigue and general malaise.  Will continue as appropriate tomorrow. ? ? ?Chesley Noon ?03/25/2022, 2:05 PM ?

## 2022-03-25 NOTE — Progress Notes (Signed)
? ? ? ?PULMONOLOGY ? ? ? ? ? ? ? ? ?Date: 03/25/2022,   ?MRN# [id number] Edwin Donovan. July 24, 1946 ? ? ?  ?AdmissionWeight: (!) 141.5 kg                 ?CurrentWeight: 126.9 kg ? ?Referring provider: Dr. Arbutus Ped ? ? ?CHIEF COMPLAINT:  ? ?Nonmassive PE with RUL nodule and mediastinal lymphadenopathy ? ? ?HISTORY OF PRESENT ILLNESS  ? ?This is a pleasant 76 year old male with a history of prostate CA, disorder, arthritis, CHF, CKD COPD, dysrhythmia history of nephrolithiasis and essential previous episode of pneumonia in 2017, he reports worsening cough and wheezing.  He was found to be mildly tachypneic on arrival and required 3 L of supplemental oxygen to reach normoxia at SPO2 95%.  Labs showed CKD however troponins were essentially normal in the context of CKD with only mild elevation.  Influenza and COVID-19 are negative.  CTA was performed with findings of subsegmental pulmonary emboli as well as 17 mm spiculated nodule of the right upper lobe and associated mediastinal lymphadenopathy/mass.  He had nephrectomy in the past due to renal cell carcinoma.  He quit smoking 2 years ago. PCCM consultation was placed for additional evaluation and management.  ? ? ?03/17/22- patient is improved, he is off BIPAP. He is speaking and answering appropriately to verbal communication. Have dcd IVF and tessalon.  Have ordered recruitment maneuvers with metaneb. CT chest today to eval lung as suggested by previous radiology report.  ? ?03/18/22 - patient is improved from respiratory perspective, he is on 6L/min and is speaking full sentences. VBG repeat today. Will start on eliquis from heparin ?03/19/22- patient has biopsy results from bronchoscopy with +small cell lung cancer and N1 nodal metastasis. He is on 6L/min [place] with Acute PE currently on eliquis. He is slowly improving clinically and is appreciative of care. I met with his [place] doctor today and we briefly reviewed medical plan.  ?Mar 20, 2022-patient's status post CT  abdomen pelvis with no newconcerning findings. He is more lucid and talktative today.we discussed his lung cancer diagnosis and he states Dr Janese Banks has already reviewed chemotherapy with him.  ?03/21/2022-patient seems to be improved clinically.  He is able to communicate without labored breathing however does have hoarseness of voice and shares that he did not sleep well.  His procalcitonin is negative.  Were planning on doing a repeat chest x-ray after performing recruitment maneuvers to review interval changes.  Also get venous blood gas for interval changes.  He does have a BiPAP ordered for home use and [employer] is working on this.  Status post medical oncology evaluation with plan to initiate chemotherapy and possible radiation.  We will attempt to wean down oxygen level with goal of SPO2 88% or higher. ? ?03/22/22- Patient is resting in bed in no distress, with spO2 97%. He is with increased left pleural effusion and complete left hemithorax opacification. Have ordered US IR thoracentesis with cytologic evaluation.  ? ?03/25/22- patient states he is weak but stable. He has plan for radiation therapy today. He is asking about his prognosis stating "maybe you should let me just go" and clarified that he is considering home hospice stating "[name] will take care of me".  I have plans to meet with him and [name] to discuss hospice.  ? ?PAST MEDICAL HISTORY  ? ?Past Medical History:  ?Diagnosis Date  ?? Anxiety   ? ptsd  ?? Arthritis   ?? Cancer [facility])   ?  PROSTATE  ?? CHF (congestive heart failure) ([place])   ?? Chronic kidney disease   ? stones  ?? COPD (chronic obstructive pulmonary disease) ([place])   ?? Dysrhythmia   ?? History of kidney stones   ?? Hypertension   ?? Kidney stone   ?? Pneumonia   ? 06/27/16  ?? Shortness of breath dyspnea   ? ? ? ?SURGICAL HISTORY  ? ?Past Surgical History:  ?Procedure Laterality Date  ?? CYSTOSCOPY W/ RETROGRADES Bilateral 12/26/2017  ? Procedure: CYSTOSCOPY WITH RETROGRADE PYELOGRAM;   Surgeon: Abbie Sons, MD;  Location: [facility];  Service: Urology;  Laterality: Bilateral;  ?? CYSTOSCOPY W/ RETROGRADES Right 02/09/2019  ? Procedure: CYSTOSCOPY WITH RETROGRADE PYELOGRAM;  Surgeon: Abbie Sons, MD;  Location: [facility];  Service: Urology;  Laterality: Right;  ?? CYSTOSCOPY W/ URETERAL STENT PLACEMENT Right 07/29/2016  ? Procedure: CYSTOSCOPY WITH STENT REPLACEMENT;  Surgeon: Hollice Espy, MD;  Location: [facility];  Service: Urology;  Laterality: Right;  ?? CYSTOSCOPY WITH BIOPSY Right 07/29/2016  ? Procedure: CYSTOSCOPY WITH BIOPSY;  Surgeon: Hollice Espy, MD;  Location: [facility];  Service: Urology;  Laterality: Right;  ?? CYSTOSCOPY WITH BIOPSY Right 02/09/2019  ? Procedure: CYSTOSCOPY WITH BIOPSY;  Surgeon: Abbie Sons, MD;  Location: [facility];  Service: Urology;  Laterality: Right;  ?? CYSTOSCOPY WITH STENT PLACEMENT Right 06/28/2016  ? Procedure: CYSTOSCOPY WITH STENT PLACEMENT;  Surgeon: Hollice Espy, MD;  Location: [facility];  Service: Urology;  Laterality: Right;  ?? CYSTOSCOPY WITH STENT PLACEMENT Right 02/09/2019  ? Procedure: CYSTOSCOPY WITH STENT PLACEMENT;  Surgeon: Abbie Sons, MD;  Location: [facility];  Service: Urology;  Laterality: Right;  ?? CYSTOSCOPY/URETEROSCOPY/HOLMIUM LASER/STENT PLACEMENT Right 12/26/2017  ? Procedure: CYSTOSCOPY/URETEROSCOPY/HOLMIUM LASER/STENT PLACEMENT;  Surgeon: Abbie Sons, MD;  Location: [facility];  Service: Urology;  Laterality: Right;  ?? EXTRACORPOREAL SHOCK WAVE LITHOTRIPSY Right 12/04/2017  ? Procedure: EXTRACORPOREAL SHOCK WAVE LITHOTRIPSY (ESWL);  Surgeon: Hollice Espy, MD;  Location: [facility];  Service: Urology;  Laterality: Right;  ?? PROSTATECTOMY    ?? URETEROSCOPY Right 07/29/2016  ? Procedure: URETEROSCOPY;  Surgeon: Hollice Espy, MD;  Location: [facility];  Service: Urology;  Laterality: Right;  ?? URETEROSCOPY Right 02/09/2019  ? Procedure: URETEROSCOPY- DIAGNOSTIC;  Surgeon: Abbie Sons, MD;  Location: [facility];   Service: Urology;  Laterality: Right;  ?? VIDEO BRONCHOSCOPY WITH ENDOBRONCHIAL NAVIGATION Left 03/15/2022  ? Procedure: VIDEO BRONCHOSCOPY WITH ENDOBRONCHIAL NAVIGATION;  Surgeon: Ottie Glazier, MD;  Location: [facility];  Service: Thoracic;  Laterality: Left;  ? ? ? ?FAMILY HISTORY  ? ?Family History  ?Problem Relation Age of Onset  ?? Stroke Mother   ?? Heart attack Father   ?? Lung cancer Father   ?? Kidney disease Father   ?? Prostate cancer Neg Hx   ?? Bladder Cancer Neg Hx   ? ? ? ?SOCIAL HISTORY  ? ?Social History  ? ?Tobacco Use  ?? Smoking status: Some Days  ?  Packs/day: 0.50  ?  Types: Cigarettes  ?? Smokeless tobacco: Never  ?Vaping Use  ?? Vaping Use: Never used  ?Substance Use Topics  ?? Alcohol use: Yes  ?  Alcohol/week: 14.0 standard drinks  ?  Types: 14 Cans of beer per week  ?? Drug use: Yes  ?  Types: Marijuana  ?  Comment: "sometimes, not very often"  ? ? ? ?MEDICATIONS  ? ? ?Home Medication:  ?Current Outpatient Rx  ?? Order #: [id number] Class: No Print  ?? Order #:  [id number] Class: No Print  ?? Order #: [id number] Class: No Print  ?? Order #: [id number] Class: No Print  ? ?  ?Current Medication: ? ?Current Facility-Administered Medications:  ??  acetaminophen (TYLENOL) tablet 650 mg, 650 mg, Oral, Q6H PRN, 650 mg at 03/16/22 0548 **OR** acetaminophen (TYLENOL) suppository 650 mg, 650 mg, Rectal, Q6H PRN, Mansy, Jan A, MD ??  albuterol (PROVENTIL) (2.5 MG/3ML) 0.083% nebulizer solution 3 mL, 3 mL, Inhalation, Q4H PRN, Nicole Kindred A, DO, 3 mL at 03/24/22 2224 ??  [COMPLETED] apixaban (ELIQUIS) tablet 10 mg, 10 mg, Oral, BID, 10 mg at 03/24/22 2202 **FOLLOWED BY** apixaban (ELIQUIS) tablet 5 mg, 5 mg, Oral, BID, Wynelle Cleveland, RPH, 5 mg at 03/25/22 0823 ??  atorvastatin (LIPITOR) tablet 80 mg, 80 mg, Oral, Daily, Mansy, Jan A, MD, 80 mg at 03/25/22 1937 ??  bisacodyl (DULCOLAX) EC tablet 5 mg, 5 mg, Oral, Daily PRN, Nicole Kindred A, DO, 5 mg at 03/14/22 1001 ??  chlorhexidine (PERIDEX) 0.12 %  solution 15 mL, 15 mL, Mouth Rinse, BID, Wouk, Ailene Rud, MD, 15 mL at 03/25/22 0824 ??  Chlorhexidine Gluconate Cloth 2 % PADS 6 each, 6 each, Topical, Q0600, Gwynne Edinger, MD, 6 each at 03/25/22 [phone]

## 2022-03-25 NOTE — Progress Notes (Signed)
Speech Language Pathology Treatment:    ?Patient Details ?Name: Edwin Donovan. ?MRN: [id number] ?DOB: 08/11/1946 ?Today's Date: 03/25/2022 ?Time: 5277-8242 ?SLP Time Calculation (min) (ACUTE ONLY): 15 min ? ?Assessment / Plan / Recommendation ?Clinical Impression ? Pt seen for diet tolerance. Pt alert, pleasant, and cooperative. On 6L/min O2 via [place]. Hoarse/hypophonic vocal quality persists.  ? ?Pt observed with items from Dysphagia 1 (pureed) and Thin Liquids meal tray including pureed pancake with syrup, pureed sausage, coffee (via cup sip), and water via straw. Pt able to feed self with set up. Pt demonstrated a grossly intact oral swallow with mildly prolonged oral prep with pureed textures. No overt or subtle s/sx pharyngeal dysphagia across trials. No change to vocal quality, vital signs, or WOB across trials. Pt reported a subjective improvement in swallow function since downgrade to pureed diet on 03/24/22. ? ?Recommend continuation of a pureed diet with thin liquids and safe swallowing strategies/aspiration precautions as outlined below.  ? ?SLP to f/u per POC for diet tolerance and trials of upgraded textures as appropriate.   ? ?Pt and RN made aware of results, recommendations, and SLP POC. Pt verbalized understanding/agreement.  ?  ?HPI HPI: Per H&P, 03/09/22, "Darren Caldron. is a 76 y.o. male with medical history significant for COPD, CHF, hypertension and CKD, who presented to the ER with acute onset of worsening lower extremity edema with associated orthopnea and dyspnea worsening on exertion over the last couple weeks.  He admitted to worsening cough as well as wheezing.  His symptoms have been getting significantly worse over the last couple of days.  No fever or chills.  No nausea or vomiting or abdominal pain.  He denies any chest pain or palpitations.  No dysuria, oliguria or hematuria or flank pain.  No bleeding diathesis.     ED Course: Upon presentation to the ER, respiratory it was 22  and pulse currently 95% on 3 L of O2 by nasal cannula with otherwise normal vital signs.  Labs revealed a BUN of 55 and creatinine 1.62 and high-sensitivity troponin was 30 and later 32.  CBC showed mild anemia and thrombocytopenia.  Influenza antigens and COVID-19 PCR came back negative.  Coagulation profile was within normal.  EKG as reviewed by me : Sinus rhythm with a rate of 78 with low voltage QRS  Imaging: For which x-ray showed large masslike opacity in the left hilum.  Chest CTA revealed the following:  Two isolated subsegmental pulmonary emboli at the bifurcation in the  right upper lobe pulmonary artery. Overall clot burden is very  small.     17 mm spiculated nodule in the posterior right upper lobe.  Associated 10.0 x 7.4 cm left mediastinal mass extending into the  medial left upper lobe and left perihilar region. This appearance  raises concern for primary bronchogenic neoplasm such as small cell  lung cancer.     Critical Value/emergent results were called by telephone at the time  of interpretation on 03/09/2022 at 2:46 am to provider Dr Pryor Curia, who verbally acknowledged these results.     Aortic Atherosclerosis (ICD10-I70.0) and Emphysema (ICD10-J43.9).    The patient was given DuoNebs, IV Solu-Medrol and was started on IV heparin with bolus and drip.  He is admitted to a progressive unit bed for further evaluation and management." ?  ?   ?SLP Plan ? Continue with current plan of care ? ?  ?  ?Recommendations for follow up therapy are one component of  a multi-disciplinary discharge planning process, led by the attending physician.  Recommendations may be updated based on patient status, additional functional criteria and insurance authorization. ?  ? ?Recommendations  ?Diet recommendations: Dysphagia 1 (puree);Thin liquid ?Medication Administration: Crushed with puree ?Supervision: Patient able to self feed (setup) ?Compensations: Minimize environmental distractions;Slow rate;Small sips/bites  (rest breaks PRN for energy conservation) ?Postural Changes and/or Swallow Maneuvers: Seated upright 90 degrees;Upright 30-60 min after meal  ?   ?    ?   ? ? ? ? Oral Care Recommendations: Oral care BID;Patient independent with oral care (setup) ?Follow Up Recommendations:  (TBD) ?Assistance recommended at discharge:  (TBD) ?SLP Visit Diagnosis: Dysphagia, oropharyngeal phase (R13.12) ?Plan: Continue with current plan of care ? ? ? ? ?  ?  ? ?Cherrie Gauze, M.S., CCC-SLP ?Speech-Language Pathologist ?[facility] ?[phone] ([place])  ? ?Quintella Baton ? ?03/25/2022, 10:28 AM ?

## 2022-03-25 NOTE — Progress Notes (Signed)
Initial Nutrition Assessment ? ?DOCUMENTATION CODES:  ? ?Obesity unspecified, Non-severe (moderate) malnutrition in context of chronic illness ? ?INTERVENTION:  ? ?-Ensure Enlive po TID, each supplement provides 350 kcal and 20 grams of protein ?-MVI with minerals daily ? ?NUTRITION DIAGNOSIS:  ? ?Moderate Malnutrition related to chronic illness (COPD, CHF, new lung cancer dx) as evidenced by energy intake < 75% for > or equal to 1 month, percent weight loss, mild fat depletion, moderate fat depletion, moderate muscle depletion. ? ?GOAL:  ? ?Patient will meet greater than or equal to 90% of their needs ? ?MONITOR:  ? ?PO intake, Supplement acceptance ? ?REASON FOR ASSESSMENT:  ? ?Consult ?Assessment of nutrition requirement/status, Poor PO ? ?ASSESSMENT:  ? ?Pt with PMH of COPD, CKD, pnuemonia, and CHF admitted with acute PE and COPD/CHF exacerbation. ? ?Pt admitted with acute pulmonary embolism and COPD/CHF exacerbation.  ? ?4/30- s/p BSE- dysphagia 3 diet with thin liquids ?5/3- biopsy positive for small cell lung cancer ?5/7- s/p BSE- downgraded diet to dysphagia 1 diet with thin liquids  ? ?Reviewed I/O's: +130 ml x 24 hours and -5.3 L since 03/11/22 ? ?UOP: 350 ml x 24 hours ? ?Case discussed with SLP; pt was downgraded to dysphagia 1 diet over the weekend secondary to respiratory status. SLP also reports concern over poor oral intake and states that pt consumes more liquids that solid foods. Per SLP, pt may benefit from addition of oral nutrition supplements.  ? ?Pt sitting up in bed at time of visit. He reports feeling better and that his swallow function and breathing have improved. Noted that pt consumed about 75% of his dysphagia diet breakfast tray and that he had consumed most of his liquids on tray (coffee, juice, and milk). Pt explains that he is more comfortable consuming liquids at this time.  ? ?Pt admits to poor oral intake this hospitalization, due to respiratory status, poor appetite, and  generally feeling unwell. Noted documented meal completions 0-25%. Per pt, he had good oral intake PTA; consuming 2 meals per day that were prepared by a home health aide.  ? ?Pt also endorses weight loss; he is unsure how much, but attributes this due to poor oral intake and recent illness. He is unsure of his UBW, but states "I was a big boy". Reviewed wt hx; pt has experienced a 5.7% wt loss over the past month, which is significant for time frame. Pt with edema, which may be masking further weight loss as well as fat and muscle depletions.  ? ?Discussed importance of good meal and supplement intake to promote healing. Pt amenable to Ensure supplements.  ? ?Palliative care following for goals of care; pt interesting in pursuing chemotherapy.  ? ?Medications reviewed and include miralax, prednisone, and senokot.  ? ?Labs reviewed: CBGS: 124.  ? ?NUTRITION - FOCUSED PHYSICAL EXAM: ? ?Flowsheet Row Most Recent Value  ?Orbital Region Moderate depletion  ?Upper Arm Region No depletion  ?Thoracic and Lumbar Region No depletion  ?Buccal Region Mild depletion  ?Temple Region Moderate depletion  ?Clavicle Bone Region No depletion  ?Clavicle and Acromion Bone Region No depletion  ?Scapular Bone Region No depletion  ?Dorsal Hand No depletion  ?Patellar Region No depletion  ?Anterior Thigh Region No depletion  ?Posterior Calf Region No depletion  ?Edema (RD Assessment) Mild  ?Hair Reviewed  ?Eyes Reviewed  ?Mouth Reviewed  ?Skin Reviewed  ?Nails Reviewed  ? ?  ? ? ?Diet Order:   ?Diet Order   ? ?       ?  DIET - DYS 1 Room service appropriate? Yes; Fluid consistency: Thin  Diet effective now       ?  ? ?  ?  ? ?  ? ? ?EDUCATION NEEDS:  ? ?Education needs have been addressed ? ?Skin:  Skin Assessment: Reviewed RN Assessment ? ?Last BM:  03/23/22 ? ?Height:  ? ?Ht Readings from Last 1 Encounters:  ?03/15/22 5\' 11"  (1.803 m)  ? ? ?Weight:  ? ?Wt Readings from Last 1 Encounters:  ?03/22/22 126.9 kg  ? ? ?Ideal Body Weight:  78.2  kg ? ?BMI:  Body mass index is 39.02 kg/m?. ? ?Estimated Nutritional Needs:  ? ?Kcal:  [phone] ? ?Protein:  115-130 grams ? ?Fluid:  > 2 L ? ? ? ?Loistine Chance, RD, LDN, CDCES ?Registered Dietitian II ?Certified Diabetes Care and Education Specialist ?Please refer to [facility] for RD and/or RD on-call/weekend/after hours pager  ?

## 2022-03-25 NOTE — Progress Notes (Signed)
?Progress Note ? ? ?Patient: Edwin Donovan. UXL:[id number] DOB: [birth year] DOA: 03/09/2022     16 ?DOS: the patient was seen and examined on 03/25/2022 ?  ?Brief hospital course: ?Edwin Donovan. is a 76 y.o. male with past medical history COPD not on home oxygen prior to recent hospital d/c on 5/36/64, chronic diastolic CHF, hypertension and CKD stage II with hx or Right nephrectomy.  He presented to the ED  with progressively worsening lower extremity edema, significant orthopnea, dyspnea worsening on exertion over the last couple weeks, in addition to worsening cough with wheezing.   ? ?Chest CTA  - two isolated subsegmental pulmonary emboli at the bifurcation in the right upper lobe pulmonary artery. Overall clot burden is very small.    ?17 mm spiculated nodule in the posterior right upper lobe.  Associated 10.0 x 7.4 cm left mediastinal mass extending into the medial left upper lobe and left perihilar region. This appearance raises concern for primary bronchogenic neoplasm such as small cell lung cancer. ?Patient developed acute respiratory failure requiring BiPAP and high flow oxygen.  This is mainly due to lung cancer ? ?Oncology decided that patient will complete radiation therapy while in the hospital. ? ?Repeated chest x-ray on 5/5 showed complete widening of left lung, ultrasound did not show significant fluids.  This is a secondary to atelectasis with potential obstruction of airway from cancer.  Continue radiation therapy ? ? ?Assessment and Plan: ? ?Acute hypoxemic respiratory failure. ?COPD with acute exacerbation. ?Acute on chronic diastolic congestive heart failure. ?Acute pulmonary emboli ?Small cell lung cancer. ?Left lung atelectasis ? Chest x-ray showed whitening out left lung.  Ultrasound did not show significant pleural effusion.  This most likely due to atelectasis, probably from left-sided bronc obstruction from cancer. ?Patient has a severe atelectasis of left lung, no  significant pleural effusion.  Condition is a relatively stable, but still requiring high flow oxygen.  Continue radiation therapy. ?Patient indicated that she want to go home after completion of radiation therapy.  But spoke with patient and girlfriend, she will not be able to take care of patient at home.  Patient long-term prognosis is very poor as his health is deteriorating. ? ?Dysphagia. ?Currently on dysphagia 1 diet, followed by speech therapy ?  ?  ?Urinary retention. ?Constipation. ?Condition improved.. ?  ?Chronic kidney disease stage IIIa. ?Acute kidney injury ruled out. ?History of renal cancer status post nephrostomy. ?Continue to follow. ? ? ?Prostate cancer. ?Follow-up with PCP as outpatient. ? ?SLE. ?Follow-up with PCP as outpatient ?  ?Morbid obesity. ?Patient has BMI 39.02 with associated congestive heart failure ? ?  ? ?Subjective:  ?Patient is still on BiPAP at night, high flow oxygen at daytime.  Short of breath with minimal exertion.  Appetite is still poor, but is able to drink Ensure.  Normal bowel movements. ? ?Physical Exam: ?Vitals:  ? 03/25/22 0012 03/25/22 0400 03/25/22 0403 03/25/22 0900  ?BP: 96/64 91/61 91/61  96/65  ?Pulse: 99 95 96 (!) 102  ?Resp: 18 15 14 18   ?Temp: 98 ?F (36.7 ?C) 98.1 ?F (36.7 ?C)    ?TempSrc: Axillary Axillary  Oral  ?SpO2: 100% 100% 100% 98%  ?Weight:      ?Height:      ? ?General exam: Appears calm and comfortable, morbidly obese ?Respiratory system: Decreased breathing sounds on left.  Respiratory effort normal. ?Cardiovascular system: S1 & S2 heard, RRR. No JVD, murmurs, rubs, gallops or clicks. No pedal edema. ?Gastrointestinal system:  Abdomen is nondistended, soft and nontender. No organomegaly or masses felt. Normal bowel sounds heard. ?Central nervous system: Alert and oriented x2. No focal neurological deficits. ?Extremities: Symmetric 5 x 5 power. ?Skin: No rashes, lesions or ulcers ?  ? ?Data Reviewed: ? ?Lab results reviewed ? ?Family Communication:  girlfriend updated ? ?Disposition: ?Status is: Inpatient ?Remains inpatient appropriate because: severity of disease, inpatient procedure ? Planned Discharge Destination: Skilled nursing facility and home with hospice ? ? ? ?Time spent: 28 minutes ? ?Author: ?Sharen Hones, MD ?03/25/2022 10:44 AM ? ?For on call review [email or web address].  ?

## 2022-03-26 ENCOUNTER — Ambulatory Visit: Payer: Medicare Other | Admitting: Family

## 2022-03-26 ENCOUNTER — Ambulatory Visit: Payer: Medicare Other

## 2022-03-26 ENCOUNTER — Other Ambulatory Visit: Payer: Self-pay

## 2022-03-26 LAB — RAD ONC ARIA SESSION SUMMARY
Course Elapsed Days: 4
Plan Fractions Treated to Date: 2
Plan Prescribed Dose Per Fraction: 2 Gy
Plan Total Fractions Prescribed: 20
Plan Total Prescribed Dose: 40 Gy
Reference Point Dosage Given to Date: 4 Gy
Reference Point Session Dosage Given: 2 Gy
Session Number: 2

## 2022-03-26 NOTE — Progress Notes (Signed)
? ? ? ?PULMONOLOGY ? ? ? ? ? ? ? ? ?Date: 03/26/2022,   ?MRN# [id number] Edwin Donovan. 1946-02-28 ? ? ?  ?AdmissionWeight: (!) 141.5 kg                 ?CurrentWeight: 129 kg ? ?Referring provider: Dr. Arbutus Ped ? ? ?CHIEF COMPLAINT:  ? ?Nonmassive PE with RUL nodule and mediastinal lymphadenopathy ? ? ?HISTORY OF PRESENT ILLNESS  ? ?This is a pleasant 76 year old male with a history of prostate CA, disorder, arthritis, CHF, CKD COPD, dysrhythmia history of nephrolithiasis and essential previous episode of pneumonia in 2017, he reports worsening cough and wheezing.  He was found to be mildly tachypneic on arrival and required 3 L of supplemental oxygen to reach normoxia at SPO2 95%.  Labs showed CKD however troponins were essentially normal in the context of CKD with only mild elevation.  Influenza and COVID-19 are negative.  CTA was performed with findings of subsegmental pulmonary emboli as well as 17 mm spiculated nodule of the right upper lobe and associated mediastinal lymphadenopathy/mass.  He had nephrectomy in the past due to renal cell carcinoma.  He quit smoking 2 years ago. PCCM consultation was placed for additional evaluation and management.  ? ? ?03/17/22- patient is improved, he is off BIPAP. He is speaking and answering appropriately to verbal communication. Have dcd IVF and tessalon.  Have ordered recruitment maneuvers with metaneb. CT chest today to eval lung as suggested by previous radiology report.  ? ?03/18/22 - patient is improved from respiratory perspective, he is on 6L/min and is speaking full sentences. VBG repeat today. Will start on eliquis from heparin ?03/19/22- patient has biopsy results from bronchoscopy with +small cell lung cancer and N1 nodal metastasis. He is on 6L/min [place] with Acute PE currently on eliquis. He is slowly improving clinically and is appreciative of care. I met with his [place] doctor today and we briefly reviewed medical plan.  ?Mar 20, 2022-patient's status post CT  abdomen pelvis with no newconcerning findings. He is more lucid and talktative today.we discussed his lung cancer diagnosis and he states Dr Janese Banks has already reviewed chemotherapy with him.  ?03/21/2022-patient seems to be improved clinically.  He is able to communicate without labored breathing however does have hoarseness of voice and shares that he did not sleep well.  His procalcitonin is negative.  Were planning on doing a repeat chest x-ray after performing recruitment maneuvers to review interval changes.  Also get venous blood gas for interval changes.  He does have a BiPAP ordered for home use and [facility] is working on this.  Status post medical oncology evaluation with plan to initiate chemotherapy and possible radiation.  We will attempt to wean down oxygen level with goal of SPO2 88% or higher. ? ?03/22/22- Patient is resting in bed in no distress, with spO2 97%. He is with increased left pleural effusion and complete left hemithorax opacification. Have ordered US IR thoracentesis with cytologic evaluation.  ? ?03/25/22- patient states he is weak but stable. He has plan for radiation therapy today. He is asking about his prognosis stating "maybe you should let me just go" and clarified that he is considering home hospice stating "[name] will take care of me".  I have plans to meet with him and [name] to discuss hospice.  ? ?03/26/22-patient seems tired and weak.  He continues to discuss end-of-life options and efficacy of therapy at this point.  I have reached out to [facility]  however received voicemail and left message with plan to meet in person tomorrow. ? ?PAST MEDICAL HISTORY  ? ?Past Medical History:  ?Diagnosis Date  ?? Anxiety   ? ptsd  ?? Arthritis   ?? Cancer [facility])   ? PROSTATE  ?? CHF (congestive heart failure) ([place])   ?? Chronic kidney disease   ? stones  ?? COPD (chronic obstructive pulmonary disease) ([place])   ?? Dysrhythmia   ?? History of kidney stones   ?? Hypertension   ?? Kidney stone   ?? Pneumonia    ? 06/27/16  ?? Shortness of breath dyspnea   ? ? ? ?SURGICAL HISTORY  ? ?Past Surgical History:  ?Procedure Laterality Date  ?? CYSTOSCOPY W/ RETROGRADES Bilateral 12/26/2017  ? Procedure: CYSTOSCOPY WITH RETROGRADE PYELOGRAM;  Surgeon: Abbie Sons, MD;  Location: [facility] OR[facility];  Service: Urology;  Laterality: Bilateral;  ?? CYSTOSCOPY W/ RETROGRADES Right 02/09/2019  ? Procedure: CYSTOSCOPY WITH RETROGRADE PYELOGRAM;  Surgeon: Abbie Sons, MD;  Location: [facility];  Service: Urology;  Laterality: Right;  ?? CYSTOSCOPY W/ URETERAL STENT PLACEMENT Right 07/29/2016  ? Procedure: CYSTOSCOPY WITH STENT REPLACEMENT;  Surgeon: Hollice Espy, MD;  Location: [facility];  Service: Urology;  Laterality: Right;  ?? CYSTOSCOPY WITH BIOPSY Right 07/29/2016  ? Procedure: CYSTOSCOPY WITH BIOPSY;  Surgeon: Hollice Espy, MD;  Location: [facility];  Service: Urology;  Laterality: Right;  ?? CYSTOSCOPY WITH BIOPSY Right 02/09/2019  ? Procedure: CYSTOSCOPY WITH BIOPSY;  Surgeon: Abbie Sons, MD;  Location: [facility];  Service: Urology;  Laterality: Right;  ?? CYSTOSCOPY WITH STENT PLACEMENT Right 06/28/2016  ? Procedure: CYSTOSCOPY WITH STENT PLACEMENT;  Surgeon: Hollice Espy, MD;  Location: [facility];  Service: Urology;  Laterality: Right;  ?? CYSTOSCOPY WITH STENT PLACEMENT Right 02/09/2019  ? Procedure: CYSTOSCOPY WITH STENT PLACEMENT;  Surgeon: Abbie Sons, MD;  Location: [facility];  Service: Urology;  Laterality: Right;  ?? CYSTOSCOPY/URETEROSCOPY/HOLMIUM LASER/STENT PLACEMENT Right 12/26/2017  ? Procedure: CYSTOSCOPY/URETEROSCOPY/HOLMIUM LASER/STENT PLACEMENT;  Surgeon: Abbie Sons, MD;  Location: [facility];  Service: Urology;  Laterality: Right;  ?? EXTRACORPOREAL SHOCK WAVE LITHOTRIPSY Right 12/04/2017  ? Procedure: EXTRACORPOREAL SHOCK WAVE LITHOTRIPSY (ESWL);  Surgeon: Hollice Espy, MD;  Location: [facility];  Service: Urology;  Laterality: Right;  ?? PROSTATECTOMY    ?? URETEROSCOPY Right 07/29/2016  ? Procedure:  URETEROSCOPY;  Surgeon: Hollice Espy, MD;  Location: [facility];  Service: Urology;  Laterality: Right;  ?? URETEROSCOPY Right 02/09/2019  ? Procedure: URETEROSCOPY- DIAGNOSTIC;  Surgeon: Abbie Sons, MD;  Location: [facility];  Service: Urology;  Laterality: Right;  ?? VIDEO BRONCHOSCOPY WITH ENDOBRONCHIAL NAVIGATION Left 03/15/2022  ? Procedure: VIDEO BRONCHOSCOPY WITH ENDOBRONCHIAL NAVIGATION;  Surgeon: Ottie Glazier, MD;  Location: [facility];  Service: Thoracic;  Laterality: Left;  ? ? ? ?FAMILY HISTORY  ? ?Family History  ?Problem Relation Age of Onset  ?? Stroke Mother   ?? Heart attack Father   ?? Lung cancer Father   ?? Kidney disease Father   ?? Prostate cancer Neg Hx   ?? Bladder Cancer Neg Hx   ? ? ? ?SOCIAL HISTORY  ? ?Social History  ? ?Tobacco Use  ?? Smoking status: Some Days  ?  Packs/day: 0.50  ?  Types: Cigarettes  ?? Smokeless tobacco: Never  ?Vaping Use  ?? Vaping Use: Never used  ?Substance Use Topics  ?? Alcohol use: Yes  ?  Alcohol/week: 14.0 standard drinks  ?  Types: 14 Cans of beer per week  ??  Drug use: Yes  ?  Types: Marijuana  ?  Comment: "sometimes, not very often"  ? ? ? ?MEDICATIONS  ? ? ?Home Medication:  ?Current Outpatient Rx  ?? Order #: [id number] Class: No Print  ?? Order #: [id number] Class: No Print  ?? Order #: [id number] Class: No Print  ?? Order #: [id number] Class: No Print  ? ?  ?Current Medication: ? ?Current Facility-Administered Medications:  ??  acetaminophen (TYLENOL) tablet 650 mg, 650 mg, Oral, Q6H PRN, 650 mg at 03/16/22 0548 **OR** acetaminophen (TYLENOL) suppository 650 mg, 650 mg, Rectal, Q6H PRN, Mansy, Jan A, MD ??  albuterol (PROVENTIL) (2.5 MG/3ML) 0.083% nebulizer solution 3 mL, 3 mL, Inhalation, Q4H PRN, Nicole Kindred A, DO, 3 mL at 03/24/22 2224 ??  [COMPLETED] apixaban (ELIQUIS) tablet 10 mg, 10 mg, Oral, BID, 10 mg at 03/24/22 2202 **FOLLOWED BY** apixaban (ELIQUIS) tablet 5 mg, 5 mg, Oral, BID, Wynelle Cleveland, RPH, 5 mg at 03/26/22 0946 ??   atorvastatin (LIPITOR) tablet 80 mg, 80 mg, Oral, Daily, Mansy, Jan A, MD, 80 mg at 03/26/22 0946 ??  bisacodyl (DULCOLAX) EC tablet 5 mg, 5 mg, Oral, Daily PRN, Nicole Kindred A, DO, 5 mg at 03/14/22 1001 ??  chlorhex

## 2022-03-26 NOTE — Progress Notes (Signed)
OT Cancellation Note ? ?Patient Details ?Name: Edwin Donovan. ?MRN: [id number] ?DOB: 1946/10/18 ? ? ?Cancelled Treatment:    Reason Eval/Treat Not Completed: Fatigue/lethargy limiting ability to participate. Upon attempt, pt sleeping. Wakes briefly to OT's arrival and requests to be left alone and continue sleeping. Will re-attempt OT tx at later date/time as pt is agreeable and appropriate.  ? ?Ardeth Perfect., MPH, MS, OTR/L ?ascom [phone] ?03/26/22, 3:25 PM ? ?

## 2022-03-26 NOTE — Progress Notes (Signed)
PT Cancellation Note ? ?Patient Details ?Name: Octave Montrose. ?MRN: [id number] ?DOB: 09-Jan-1946 ? ? ?Cancelled Treatment:    Reason Eval/Treat Not Completed: Fatigue/lethargy limiting ability to participate (Per OT, patient notably fatigued/lethargic and unable to participate with skilled services this date.  Will continue efforts next date as medically appropriate and patient agreeable.) ? ?[name] H. Owens Shark, PT, DPT, NCS ?03/26/22, 5:27 PM ?[phone] ? ?

## 2022-03-26 NOTE — Progress Notes (Addendum)
?Progress Note ? ? ?Patient: Edwin Donovan. BLT:[id number] DOB: [birth year] DOA: 03/09/2022     17 ?DOS: the patient was seen and examined on 03/26/2022 ?  ?Brief hospital course: ?Edwin Donovan. is a 76 y.o. male with past medical history COPD not on home oxygen prior to recent hospital d/c on 0/07/62, chronic diastolic CHF, hypertension and CKD stage II with hx or Right nephrectomy.  He presented to the ED  with progressively worsening lower extremity edema, significant orthopnea, dyspnea worsening on exertion over the last couple weeks, in addition to worsening cough with wheezing.   ? ?Chest CTA  - two isolated subsegmental pulmonary emboli at the bifurcation in the right upper lobe pulmonary artery. Overall clot burden is very small.    ?17 mm spiculated nodule in the posterior right upper lobe.  Associated 10.0 x 7.4 cm left mediastinal mass extending into the medial left upper lobe and left perihilar region. This appearance raises concern for primary bronchogenic neoplasm such as small cell lung cancer. ?Patient developed acute respiratory failure requiring BiPAP and high flow oxygen.  This is mainly due to lung cancer ? ?Oncology decided that patient will complete radiation therapy while in the hospital. ? ?Repeated chest x-ray on 5/5 showed complete widening of left lung, ultrasound did not show significant fluids.  This is a secondary to atelectasis with potential obstruction of airway from cancer.  Continue radiation therapy ? ? ?Assessment and Plan: ?Acute hypoxemic respiratory failure. ?COPD with acute exacerbation. ?Acute on chronic diastolic congestive heart failure. ?Acute pulmonary emboli ?Small cell lung cancer. ?Left lung atelectasis ? Chest x-ray showed whitening out left lung.  Ultrasound did not show significant pleural effusion.  This most likely due to atelectasis, probably from left-sided bronc obstruction from cancer. ?Patient initially treated with steroids and IV Lasix.   Currently patient does not have any bronchospasm or volume overload.  No secondary bacterial pneumonia. ?Patient still on high flow oxygen in the daytime and BiPAP at nighttime, this is a secondary to small cell lung cancer as well as severe left side atelectasis. ?Patient is a followed by pulmonology, currently receiving radiation therapy which is palliative. ?Hopefully, oxygenation can be better after completion of palliative radiation.  ?Need to decide future direction after the completion of treatment: Hospice versus aggressive chemotherapy. ?Long-term prognosis is very poor. ?On eliquis ?  ?Dysphagia. ?Currently on dysphagia 1 diet, followed by speech therapy.  Did not seem to have aspiration pneumonia at this time. ?  ?  ?Urinary retention. ?Constipation. ?Condition improved.. ?  ?Chronic kidney disease stage IIIa. ?Acute kidney injury ruled out. ?History of renal cancer status post nephrostomy. ?Continue to follow. ? ? ?Prostate cancer. ?Follow-up with PCP as outpatient. ? ?SLE. ?Follow-up with PCP as outpatient ?  ?Morbid obesity. ?Patient has BMI 39.02 with associated congestive heart failure ? ? ?  ? ?Subjective:  ?Patient still on high flow oxygen at 6 L in the morning, BiPAP at nighttime.  Short of breath with minimal exertion.  Patient has a very poor appetite, but no nausea vomiting. ? ?Physical Exam: ?Vitals:  ? 03/26/22 0400 03/26/22 0800 03/26/22 0949 03/26/22 1129  ?BP: 102/71 118/66 99/68 107/76  ?Pulse: (!) 103  (!) 116 (!) 115  ?Resp: 20 16 18 17   ?Temp: 97.9 ?F (36.6 ?C)  97.7 ?F (36.5 ?C) 97.8 ?F (36.6 ?C)  ?TempSrc: Oral  Oral Oral  ?SpO2: 96% 98% 94% 91%  ?Weight: 129 kg     ?Height:      ? ?  General exam: Appears calm, ill-appearing, morbid obese. ?Respiratory system: Decreased breathing sounds on left. Respiratory effort normal. ?Cardiovascular system: S1 & S2 heard, RRR. No JVD, murmurs, rubs, gallops or clicks. No pedal edema. ?Gastrointestinal system: Abdomen is nondistended, soft and  nontender. No organomegaly or masses felt. Normal bowel sounds heard. ?Central nervous system: Alert and oriented x3. No focal neurological deficits. ?Extremities: Symmetric 5 x 5 power. ?Skin: No rashes, lesions or ulcers ?Psychiatry: Judgement and insight appear normal. Mood & affect appropriate.  ? ?Data Reviewed: ? ?No new labs. ? ?Family Communication:  ? ?Disposition: ?Status is: Inpatient ?Remains inpatient appropriate because: Severity of disease, inpatient procedure. ? Planned Discharge Destination: Skilled nursing facility ? ? ? ?Time spent: 35 minutes ? ?Author: ?Sharen Hones, MD ?03/26/2022 11:55 AM ? ?For on call review [email or web address].  ?

## 2022-03-27 ENCOUNTER — Ambulatory Visit: Payer: Medicare Other

## 2022-03-27 DIAGNOSIS — R338 Other retention of urine: Secondary | ICD-10-CM

## 2022-03-27 LAB — CBC
HCT: 27.9 % — ABNORMAL LOW (ref 39.0–52.0)
Hemoglobin: 8.8 g/dL — ABNORMAL LOW (ref 13.0–17.0)
MCH: 31.1 pg (ref 26.0–34.0)
MCHC: 31.5 g/dL (ref 30.0–36.0)
MCV: 98.6 fL (ref 80.0–100.0)
Platelets: 146 10*3/uL — ABNORMAL LOW (ref 150–400)
RBC: 2.83 MIL/uL — ABNORMAL LOW (ref 4.22–5.81)
RDW: 14.3 % (ref 11.5–15.5)
WBC: 9.8 10*3/uL (ref 4.0–10.5)
nRBC: 0 % (ref 0.0–0.2)

## 2022-03-27 MED ORDER — SODIUM CHLORIDE 0.9% FLUSH
3.0000 mL | Freq: Two times a day (BID) | INTRAVENOUS | Status: DC
Start: 2022-03-27 — End: 2022-03-28
  Administered 2022-03-27: 3 mL via INTRAVENOUS

## 2022-03-27 MED ORDER — MORPHINE SULFATE (PF) 2 MG/ML IV SOLN
1.0000 mg | INTRAVENOUS | Status: DC | PRN
  Administered 2022-03-27 – 2022-03-28 (×4): 1 mg via INTRAVENOUS
  Filled 2022-03-27 (×4): qty 1

## 2022-03-27 NOTE — Progress Notes (Signed)
Physical Therapy Treatment ?Patient Details ?Name: Edwin Donovan. ?MRN: [id number] ?DOB: 08/17/1946 ?Today's Date: 03/27/2022 ? ? ?History of Present Illness Pt is a 76 year old male presenting to the hospital  with progressively worsening lower extremity edema, significant orthopnea, dyspnea worsening on exertion over the last couple weeks, in addition to worsening cough with wheezing.  Chest x-ray reveals large masslike opacity in the left hilumn chest CTA reveals two isolated subsegmental pulmonary emboli at the bifurcation in the right upper lobe pulmonary artery. Overall clot burden is very small.     17 mm spiculated nodule in the posterior right upper lobe.  Associated 10.0 x 7.4 cm left mediastinal mass extending into the medial left upper lobe and left perihilar region. This appearance raises concern for primary bronchogenic neoplasm such as small cell lung cancer.PMH significant for COPD not on home oxygen prior to recent hospital d/c on 0/25/85, chronic diastolic CHF, hypertension and CKD stage II with hx or Right nephrectomy. Hospital stay complicated by+ PE and transfer to CCU due to decreased respiratory status. Re-evaluation consult received. ? ?  ?PT Comments  ? ? Pt was seen for a visit today after mult days of being too tired, feeling too poorly, to be able to move.  Pt was assisted to do there ex to BLE's, requiring help on RLE and LLE being stronger.  Will encourage OOB to chair in subsequent visits but continue to recommend SNF due to his dense weakness, limited active movement an  low endurance that make returning home impractical.  Follow for acute PT goals as outlined in POC.  ?Recommendations for follow up therapy are one component of a multi-disciplinary discharge planning process, led by the attending physician.  Recommendations may be updated based on patient status, additional functional criteria and insurance authorization. ? ?Follow Up Recommendations ? Skilled nursing-short term  rehab (<3 hours/day) ?  ?  ?Assistance Recommended at Discharge Frequent or constant Supervision/Assistance  ?Patient can return home with the following Two people to help with walking and/or transfers;Two people to help with bathing/dressing/bathroom;Assistance with cooking/housework;Direct supervision/assist for medications management;Assist for transportation;Help with stairs or ramp for entrance ?  ?Equipment Recommendations ? None recommended by PT  ?  ?Recommendations for Other Services   ? ? ?  ?Precautions / Restrictions Precautions ?Precautions: Fall ?Precaution Comments: monitor O2 sats and HR ?Restrictions ?Weight Bearing Restrictions: No  ?  ? ?Mobility ? Bed Mobility ?  ?  ?  ?  ?  ?  ?  ?General bed mobility comments: declines to get OOB ?  ? ?Transfers ?Overall transfer level: Needs assistance ?  ?  ?  ?  ?  ?  ?  ?  ?General transfer comment: declined OOB ?  ? ?Ambulation/Gait ?  ?  ?  ?  ?  ?  ?  ?  ? ? ?Stairs ?  ?  ?  ?  ?  ? ? ?Wheelchair Mobility ?  ? ?Modified Rankin (Stroke Patients Only) ?  ? ? ?  ?Balance   ?  ?  ?  ?  ?  ?  ?  ?  ?  ?  ?  ?  ?  ?  ?  ?  ?  ?  ?  ? ?  ?Cognition Arousal/Alertness: Awake/alert ?Behavior During Therapy: [facility] for tasks assessed/performed ?Overall Cognitive Status: Within Functional Limits for tasks assessed ?  ?  ?  ?  ?  ?  ?  ?  ?  ?  ?  ?  ?  ?  ?  ?  ?  General Comments: cooperative and pleasant ?  ?  ? ?  ?Exercises General Exercises - Lower Extremity ?Ankle Circles/Pumps: AAROM, 5 reps ?Quad Sets: AROM, 10 reps ?Gluteal Sets: AROM, 10 reps ?Heel Slides: AROM, AAROM, 10 reps ?Hip ABduction/ADduction: AAROM, AROM, 10 reps ? ?  ?General Comments General comments (skin integrity, edema, etc.): pt was in bed with O2 sats fluctuating from 85% to 97%, pulses are 95-107 ?  ?  ? ?Pertinent Vitals/Pain Pain Assessment ?Pain Assessment: Faces ?Faces Pain Scale: Hurts a little bit ?Pain Location: feet and legs ?Pain Descriptors / Indicators: Guarding ?Pain  Intervention(s): Limited activity within patient's tolerance, Monitored during session, Repositioned  ? ? ?Home Living   ?  ?  ?  ?  ?  ?  ?  ?  ?  ?   ?  ?Prior Function    ?  ?  ?   ? ?PT Goals (current goals can now be found in the care plan section) Acute Rehab PT Goals ?Patient Stated Goal: to get energy back ?Progress towards PT goals: Not progressing toward goals - comment ? ?  ?Frequency ? ? ? Min 2X/week ? ? ? ?  ?PT Plan Current plan remains appropriate  ? ? ?Co-evaluation   ?  ?  ?  ?  ? ?  ?AM-PAC PT "6 Clicks" Mobility   ?Outcome Measure ? Help needed turning from your back to your side while in a flat bed without using bedrails?: A Lot ?Help needed moving from lying on your back to sitting on the side of a flat bed without using bedrails?: A Lot ?Help needed moving to and from a bed to a chair (including a wheelchair)?: Total ?Help needed standing up from a chair using your arms (e.g., wheelchair or bedside chair)?: Total ?Help needed to walk in hospital room?: Total ?Help needed climbing 3-5 steps with a railing? : Total ?6 Click Score: 8 ? ?  ?End of Session Equipment Utilized During Treatment: Oxygen ?Activity Tolerance: Patient limited by fatigue;Treatment limited secondary to medical complications (Comment) ?Patient left: in bed ?Nurse Communication: Mobility status ?PT Visit Diagnosis: Unsteadiness on feet (R26.81);Muscle weakness (generalized) (M62.81);History of falling (Z91.81);Difficulty in walking, not elsewhere classified (R26.2) ?  ? ? ?Time: 2876-8115 ?PT Time Calculation (min) (ACUTE ONLY): 15 min ? ?Charges:  $Therapeutic Exercise: 8-22 mins ?Ramond Dial ?03/27/2022, 1:04 PM ? ?Mee Hives, PT PhD ?Acute Rehab Dept. Number: [facility] [phone] and [place] [phone] ? ?

## 2022-03-27 NOTE — Progress Notes (Signed)
? ? ? ?PULMONOLOGY ? ? ? ? ? ? ? ? ?Date: 03/27/2022,   ?MRN# [id number] Edwin Donovan. 1946/02/11 ? ? ?  ?AdmissionWeight: (!) 141.5 kg                 ?CurrentWeight: 129 kg ? ?Referring provider: Dr. Arbutus Donovan ? ? ?CHIEF COMPLAINT:  ? ?Nonmassive PE with RUL nodule and mediastinal lymphadenopathy ? ? ?HISTORY OF PRESENT ILLNESS  ? ?This is a pleasant 76 year old male with a history of prostate CA, disorder, arthritis, CHF, CKD COPD, dysrhythmia history of nephrolithiasis and essential previous episode of pneumonia in 2017, he reports worsening cough and wheezing.  He was found to be mildly tachypneic on arrival and required 3 L of supplemental oxygen to reach normoxia at SPO2 95%.  Labs showed CKD however troponins were essentially normal in the context of CKD with only mild elevation.  Influenza and COVID-19 are negative.  CTA was performed with findings of subsegmental pulmonary emboli as well as 17 mm spiculated nodule of the right upper lobe and associated mediastinal lymphadenopathy/mass.  He had nephrectomy in the past due to renal cell carcinoma.  He quit smoking 2 years ago. PCCM consultation was placed for additional evaluation and management.  ? ? ?03/17/22- patient is improved, he is off BIPAP. He is speaking and answering appropriately to verbal communication. Have dcd IVF and tessalon.  Have ordered recruitment maneuvers with metaneb. CT chest today to eval lung as suggested by previous radiology report.  ? ?03/18/22 - patient is improved from respiratory perspective, he is on 6L/min and is speaking full sentences. VBG repeat today. Will start on eliquis from heparin ?03/19/22- patient has biopsy results from bronchoscopy with +small cell lung cancer and N1 nodal metastasis. He is on 6L/min Edwin Donovan with Acute PE currently on eliquis. He is slowly improving clinically and is appreciative of care. I met with his [place] doctor today and we briefly reviewed medical plan.  ?Mar 20, 2022-patient's status post CT  abdomen pelvis with no newconcerning findings. He is more lucid and talktative today.we discussed his lung cancer diagnosis and he states Edwin Donovan has already reviewed chemotherapy with him.  ?03/21/2022-patient seems to be improved clinically.  He is able to communicate without labored breathing however does have hoarseness of voice and shares that he did not sleep well.  His procalcitonin is negative.  Were planning on doing a repeat chest x-ray after performing recruitment maneuvers to review interval changes.  Also get venous blood gas for interval changes.  He does have a BiPAP ordered for home use and [facility] is working on this.  Status post medical oncology evaluation with plan to initiate chemotherapy and possible radiation.  We will attempt to wean down oxygen level with goal of SPO2 88% or higher. ? ?03/22/22- Patient is resting in bed in no distress, with spO2 97%. He is with increased left pleural effusion and complete left hemithorax opacification. Have ordered US IR thoracentesis with cytologic evaluation.  ? ?03/25/22- patient states he is weak but stable. He has plan for radiation therapy today. He is asking about his prognosis stating "maybe you should let me just go" and clarified that he is considering home hospice stating "Edwin Donovan will take care of me".  I have plans to meet with him and Edwin Donovan to discuss hospice.  ? ?03/26/22-patient seems tired and weak.  He continues to discuss end-of-life options and efficacy of therapy at this Donovan.  I have reached out to Edwin Donovan  however received voicemail and left message with plan to meet in person tomorrow. ? ?03/27/22- patient met with me and his Edwin Donovan was present we dicussed his wishes and he is sure he wants to proceed with hospice so we can de-escalate his medications. I have dcd prednisone also  ? ?PAST MEDICAL HISTORY  ? ?Past Medical History:  ?Diagnosis Date  ?? Anxiety   ? ptsd  ?? Arthritis   ?? Cancer [facility])   ? PROSTATE  ?? CHF (congestive heart failure)  ([place])   ?? Chronic kidney disease   ? stones  ?? COPD (chronic obstructive pulmonary disease) ([place])   ?? Dysrhythmia   ?? History of kidney stones   ?? Hypertension   ?? Kidney stone   ?? Pneumonia   ? 06/27/16  ?? Shortness of breath dyspnea   ? ? ? ?SURGICAL HISTORY  ? ?Past Surgical History:  ?Procedure Laterality Date  ?? CYSTOSCOPY W/ RETROGRADES Bilateral 12/26/2017  ? Procedure: CYSTOSCOPY WITH RETROGRADE PYELOGRAM;  Surgeon: Abbie Sons, MD;  Location: [facility] ORS;  Service: Urology;  Laterality: Bilateral;  ?? CYSTOSCOPY W/ RETROGRADES Right 02/09/2019  ? Procedure: CYSTOSCOPY WITH RETROGRADE PYELOGRAM;  Surgeon: Abbie Sons, MD;  Location: [facility];  Service: Urology;  Laterality: Right;  ?? CYSTOSCOPY W/ URETERAL STENT PLACEMENT Right 07/29/2016  ? Procedure: CYSTOSCOPY WITH STENT REPLACEMENT;  Surgeon: Hollice Espy, MD;  Location: [facility];  Service: Urology;  Laterality: Right;  ?? CYSTOSCOPY WITH BIOPSY Right 07/29/2016  ? Procedure: CYSTOSCOPY WITH BIOPSY;  Surgeon: Hollice Espy, MD;  Location: [facility];  Service: Urology;  Laterality: Right;  ?? CYSTOSCOPY WITH BIOPSY Right 02/09/2019  ? Procedure: CYSTOSCOPY WITH BIOPSY;  Surgeon: Abbie Sons, MD;  Location: [facility];  Service: Urology;  Laterality: Right;  ?? CYSTOSCOPY WITH STENT PLACEMENT Right 06/28/2016  ? Procedure: CYSTOSCOPY WITH STENT PLACEMENT;  Surgeon: Hollice Espy, MD;  Location: [facility];  Service: Urology;  Laterality: Right;  ?? CYSTOSCOPY WITH STENT PLACEMENT Right 02/09/2019  ? Procedure: CYSTOSCOPY WITH STENT PLACEMENT;  Surgeon: Abbie Sons, MD;  Location: [facility];  Service: Urology;  Laterality: Right;  ?? CYSTOSCOPY/URETEROSCOPY/HOLMIUM LASER/STENT PLACEMENT Right 12/26/2017  ? Procedure: CYSTOSCOPY/URETEROSCOPY/HOLMIUM LASER/STENT PLACEMENT;  Surgeon: Abbie Sons, MD;  Location: [facility];  Service: Urology;  Laterality: Right;  ?? EXTRACORPOREAL SHOCK WAVE LITHOTRIPSY Right 12/04/2017  ? Procedure:  EXTRACORPOREAL SHOCK WAVE LITHOTRIPSY (ESWL);  Surgeon: Hollice Espy, MD;  Location: [facility];  Service: Urology;  Laterality: Right;  ?? PROSTATECTOMY    ?? URETEROSCOPY Right 07/29/2016  ? Procedure: URETEROSCOPY;  Surgeon: Hollice Espy, MD;  Location: [facility];  Service: Urology;  Laterality: Right;  ?? URETEROSCOPY Right 02/09/2019  ? Procedure: URETEROSCOPY- DIAGNOSTIC;  Surgeon: Abbie Sons, MD;  Location: [facility];  Service: Urology;  Laterality: Right;  ?? VIDEO BRONCHOSCOPY WITH ENDOBRONCHIAL NAVIGATION Left 03/15/2022  ? Procedure: VIDEO BRONCHOSCOPY WITH ENDOBRONCHIAL NAVIGATION;  Surgeon: Ottie Glazier, MD;  Location: [facility];  Service: Thoracic;  Laterality: Left;  ? ? ? ?FAMILY HISTORY  ? ?Family History  ?Problem Relation Age of Onset  ?? Stroke Mother   ?? Heart attack Father   ?? Lung cancer Father   ?? Kidney disease Father   ?? Prostate cancer Neg Hx   ?? Bladder Cancer Neg Hx   ? ? ? ?SOCIAL HISTORY  ? ?Social History  ? ?Tobacco Use  ?? Smoking status: Some Days  ?  Packs/day: 0.50  ?  Types: Cigarettes  ?? Smokeless  tobacco: Never  ?Vaping Use  ?? Vaping Use: Never used  ?Substance Use Topics  ?? Alcohol use: Yes  ?  Alcohol/week: 14.0 standard drinks  ?  Types: 14 Cans of beer per week  ?? Drug use: Yes  ?  Types: Marijuana  ?  Comment: "sometimes, not very often"  ? ? ? ?MEDICATIONS  ? ? ?Home Medication:  ?Current Outpatient Rx  ?? Order #: [id number] Class: No Print  ?? Order #: [id number] Class: No Print  ?? Order #: [id number] Class: No Print  ?? Order #: [id number] Class: No Print  ? ?  ?Current Medication: ? ?Current Facility-Administered Medications:  ??  acetaminophen (TYLENOL) tablet 650 mg, 650 mg, Oral, Q6H PRN, 650 mg at 03/27/22 1403 **OR** acetaminophen (TYLENOL) suppository 650 mg, 650 mg, Rectal, Q6H PRN, Mansy, Jan A, MD ??  albuterol (PROVENTIL) (2.5 MG/3ML) 0.083% nebulizer solution 3 mL, 3 mL, Inhalation, Q4H PRN, Nicole Kindred A, DO, 3 mL at 03/24/22 2224 ??   [COMPLETED] apixaban (ELIQUIS) tablet 10 mg, 10 mg, Oral, BID, 10 mg at 03/24/22 2202 **FOLLOWED BY** apixaban (ELIQUIS) tablet 5 mg, 5 mg, Oral, BID, Wynelle Cleveland, RPH, 5 mg at 03/26/22 2156 ??  atorvastatin (LIPITOR

## 2022-03-27 NOTE — Progress Notes (Addendum)
Speech Language Pathology Treatment: Dysphagia  ?Patient Details ?Name: Edwin Donovan. ?MRN: [id number] ?DOB: 03/21/1946 ?Today's Date: 03/27/2022 ?Time: 1535-1600 ?SLP Time Calculation (min) (ACUTE ONLY): 25 min ? ?Assessment / Plan / Recommendation ?Clinical Impression ? Pt and family seen for education re: diet consistency upgrade in order to meet [place] for QOL. Met w/ pt/family in room. Pt remains on increased O2 support. Per discussion w/ NSG, then pt/family, pt is now transitioning to comfort care w/ Hospice services. Met w/ pt/family to see what his wishes re: a diet are and if he would want to upgrade in his diet consistency using general precautions/strategies, especially for conservation of energy. Pt was in full agreement.  ? ?Thorough education completed w/ pt/Family in room on diet consistency and options; modifying foods for ease of eating/mastication; use of condiments, soups, and gravies; Rest Breaks for conservation of energy. Also support w/ feeding at meals(foods) but attempt to hold Cup when drinking. ? ?Diet upgraded to Regular consistency w/ thin liquids w/ cut meats, no salads. Aspiration precautions/strategies w/ support feeding and supervision at all meals. NSG updated. MD updated/agreed. ST services can be available for further education/needs while admitted.  ? ? ?  ?HPI HPI: Per H&P, 03/09/22, "Edwin Donovan. is a 76 y.o. male with medical history significant for COPD, CHF, hypertension and CKD, who presented to the ER with acute onset of worsening lower extremity edema with associated orthopnea and dyspnea worsening on exertion over the last couple weeks.  He admitted to worsening cough as well as wheezing.  His symptoms have been getting significantly worse over the last couple of days.  No fever or chills.  No nausea or vomiting or abdominal pain.  He denies any chest pain or palpitations.  No dysuria, oliguria or hematuria or flank pain.  No bleeding diathesis.     ED  Course: Upon presentation to the ER, respiratory it was 22 and pulse currently 95% on 3 L of O2 by nasal cannula with otherwise normal vital signs.  Labs revealed a BUN of 55 and creatinine 1.62 and high-sensitivity troponin was 30 and later 32.  CBC showed mild anemia and thrombocytopenia.  Influenza antigens and COVID-19 PCR came back negative.  Coagulation profile was within normal.  EKG as reviewed by me : Sinus rhythm with a rate of 78 with low voltage QRS  Imaging: For which x-ray showed large masslike opacity in the left hilum.  Chest CTA revealed the following:  Two isolated subsegmental pulmonary emboli at the bifurcation in the  right upper lobe pulmonary artery. Overall clot burden is very  small.     17 mm spiculated nodule in the posterior right upper lobe.  Associated 10.0 x 7.4 cm left mediastinal mass extending into the  medial left upper lobe and left perihilar region. This appearance  raises concern for primary bronchogenic neoplasm such as small cell  lung cancer.     Critical Value/emergent results were called by telephone at the time  of interpretation on 03/09/2022 at 2:46 am to provider Dr Pryor Curia, who verbally acknowledged these results.     Aortic Atherosclerosis (ICD10-I70.0) and Emphysema (ICD10-J43.9).    The patient was given DuoNebs, IV Solu-Medrol and was started on IV heparin with bolus and drip.  He is admitted to a progressive unit bed for further evaluation and management.".   Pt has transitioned to comfort care per NSG/chart notes. ?  ?   ?SLP Plan ? All goals met;Discharge SLP  treatment due to (comment) (pt has transitioned to comfort care per NSG/chart) ? ?  ?  ?Recommendations for follow up therapy are one component of a multi-disciplinary discharge planning process, led by the attending physician.  Recommendations may be updated based on patient status, additional functional criteria and insurance authorization. ?  ? ?Recommendations  ?Diet recommendations: Regular;Thin  liquid (w/ meats, foods cut well; moistened foods) ?Liquids provided via: Cup;Straw (monitor) ?Medication Administration: Crushed with puree (for ease) ?Supervision: Patient able to self feed;Staff to assist with self feeding (support; rest breaks) ?Compensations: Minimize environmental distractions;Slow rate;Small sips/bites;Lingual sweep for clearance of pocketing;Follow solids with liquid (Rest breaks) ?Postural Changes and/or Swallow Maneuvers: Out of bed for meals;Seated upright 90 degrees;Upright 30-60 min after meal  ?   ?    ?   ? ? ? ? General recommendations:  (Palliative Care/Hospice following) ?Oral Care Recommendations: Oral care BID;Oral care before and after PO;Staff/trained caregiver to provide oral care (support) ?Follow Up Recommendations: No SLP follow up ?Assistance recommended at discharge: Frequent or constant Supervision/Assistance ?SLP Visit Diagnosis: Dysphagia, oropharyngeal phase (R13.12) ?Plan: All goals met;Discharge SLP treatment due to (comment) (pt has transitioned to comfort care per NSG/chart) ? ? ? ? ?  ?  ? ? ? ? ? ? ?Orinda Kenner, MS, CCC-SLP ?Speech Language Pathologist ?Rehab Services; [place] ?[phone] (ascom) ?Watson,Katherine ? ?03/27/2022, 5:23 PM ?

## 2022-03-27 NOTE — Progress Notes (Addendum)
Manufacturing engineer [facility]) Hospital Liaison Note ? ?Received request from Transitions of Care Manager Donny Pique, RN for family interest in [place]. Visited patient at bedside and spoke with Friend/[name] to confirm interest and explain services. ? ?Approval for Hospice Home is determined by [facility] MD. Once [facility] MD has determined Hospice Home eligibility, [facility] will update hospital staff and family. Eligibility is approved ? ?Please do not hesitate to call with any hospice related questions.  ?  ?Thank you for the opportunity to participate in this patient's care. ? ?Daphene Calamity, MSW ?[place]  ?[phone] ?

## 2022-03-27 NOTE — TOC Progression Note (Signed)
Transition of Care (TOC) - Progression Note  ? ? ?Patient Details  ?Name: Lavarius Doughten. ?MRN: [id number] ?Date of Birth: [birth year] ? ?Transition of Care (TOC) CM/SW Contact  ?Laurena Slimmer, RN ?Phone Number: ?03/27/2022, 1:00 PM ? ?Clinical Narrative:    ?Request for hospice referral received from Dr.Amery. Referral made to Daphene Calamity, [place] representative.  ? ? ?Expected Discharge Plan: [place] ?Barriers to Discharge: Continued Medical Work up ? ?Expected Discharge Plan and Services ?Expected Discharge Plan: [place] ?  ?  ?Post Acute Care Choice: [place] ?Living arrangements for the past 2 months: [place] ?Expected Discharge Date: 03/12/22               ?  ?  ?  ?  ?  ?  ?  ?  ?  ?  ? ? ?Social Determinants of Health (SDOH) Interventions ?  ? ?Readmission Risk Interventions ? ?  03/13/2022  ?  3:42 PM  ?Readmission Risk Prevention Plan  ?Transportation Screening Complete  ?PCP or Specialist Appt within 3-5 Days Complete  ?[place] or Home Care Consult Complete  ?Social Work Consult for [place] Planning/Counseling Complete  ?Palliative Care Screening Not Applicable  ?Medication Review Press photographer) Complete  ? ? ?

## 2022-03-27 NOTE — Progress Notes (Signed)
?PROGRESS NOTE ? ? ? ?Edwin Donovan.  WPY:[id number] DOB: 1946/05/06 DOA: 03/09/2022 ?PCP: System, Provider Not In  ? ? ?Brief Narrative:  ?Edwin Donovan" F Edwin Donovan. is a 76 y.o. male with past medical history COPD not on home oxygen prior to recent hospital d/c on 0/53/97, chronic diastolic CHF, hypertension and CKD stage II with hx or Right nephrectomy.  He presented to the ED  with progressively worsening lower extremity edema, significant orthopnea, dyspnea worsening on exertion over the last couple weeks, in addition to worsening cough with wheezing.   ? ?Chest CTA  - two isolated subsegmental pulmonary emboli at the bifurcation in the right upper lobe pulmonary artery. Overall clot burden is very small.    ?17 mm spiculated nodule in the posterior right upper lobe.  Associated 10.0 x 7.4 cm left mediastinal mass extending into the medial left upper lobe and left perihilar region. This appearance raises concern for primary bronchogenic neoplasm such as small cell lung cancer. ?Patient developed acute respiratory failure requiring BiPAP and high flow oxygen.  This is mainly due to lung cancer ?  ?Oncology decided that patient will complete radiation therapy while in the hospital. ? ? ? ?Consultants:  ?Oncology , pccm ? ?Procedures:  ? ?Antimicrobials:  ?  ? ? ?Subjective: ?Pt reports to me he feels weak, sob, and not eating much. Poor appetite. He is not interested in radiation therapy any longer.  He is tired of everything and feels very weak.  He does not feel he can handle anymore treatment.  He refused his medications today and just wants pain meds and comfort.  We discussed extensively about comfort measures, he is interested in hospice facility.  ? ?Objective: ?Vitals:  ? 03/27/22 0940 03/27/22 1125 03/27/22 1340 03/27/22 1519  ?BP: 101/65 99/64 103/64 102/63  ?Pulse: (!) 103 (!) 108 (!) 104 (!) 106  ?Resp: 20 20 20 20   ?Temp: 97.9 ?F (36.6 ?C) 97.9 ?F (36.6 ?C) 97.8 ?F (36.6 ?C) 98 ?F (36.7 ?C)   ?TempSrc: Oral Oral Oral Oral  ?SpO2: 96% 97% 100% 100%  ?Weight:      ?Height:      ? ? ?Intake/Output Summary (Last 24 hours) at 03/27/2022 1613 ?Last data filed at 03/27/2022 1520 ?Gross per 24 hour  ?Intake 360 ml  ?Output 900 ml  ?Net -540 ml  ? ?Filed Weights  ? 03/21/22 0500 03/22/22 0500 03/26/22 0400  ?Weight: 127.2 kg 126.9 kg 129 kg  ? ? ?Examination: ?Calm, NAD, becomes sob mildly  ?Coarse bronchial sounds ?Reg s1/s2 no gallop ?Soft benign +bs ?No edema ?Aaoxox3  ?Mood and affect appropriate in current setting  ? ? ? ?Data Reviewed: I have personally reviewed following labs and imaging studies ? ?CBC: ?Recent Labs  ?Lab 03/27/22 ?6734  ?WBC 9.8  ?HGB 8.8*  ?HCT 27.9*  ?MCV 98.6  ?PLT 146*  ? ?Basic Metabolic Panel: ?Recent Labs  ?Lab 03/23/22 ?0740  ?NA 135  ?K 4.7  ?CL 98  ?CO2 29  ?GLUCOSE 103*  ?BUN 58*  ?CREATININE 1.55*  ?CALCIUM 8.9  ? ?GFR: ?Estimated Creatinine Clearance: 54.6 mL/min (A) (by C-G formula based on SCr of 1.55 mg/dL (H)). ?Liver Function Tests: ?No results for input(s): AST, ALT, ALKPHOS, BILITOT, PROT, ALBUMIN in the last 168 hours. ?No results for input(s): LIPASE, AMYLASE in the last 168 hours. ?No results for input(s): AMMONIA in the last 168 hours. ?Coagulation Profile: ?No results for input(s): INR, PROTIME in the last 168 hours. ?Cardiac  Enzymes: ?No results for input(s): CKTOTAL, CKMB, CKMBINDEX, TROPONINI in the last 168 hours. ?BNP (last 3 results) ?No results for input(s): PROBNP in the last 8760 hours. ?HbA1C: ?No results for input(s): HGBA1C in the last 72 hours. ?CBG: ?No results for input(s): GLUCAP in the last 168 hours. ?Lipid Profile: ?No results for input(s): CHOL, HDL, LDLCALC, TRIG, CHOLHDL, LDLDIRECT in the last 72 hours. ?Thyroid Function Tests: ?No results for input(s): TSH, T4TOTAL, FREET4, T3FREE, THYROIDAB in the last 72 hours. ?Anemia Panel: ?No results for input(s): VITAMINB12, FOLATE, FERRITIN, TIBC, IRON, RETICCTPCT in the last 72 hours. ?Sepsis  Labs: ?No results for input(s): PROCALCITON, LATICACIDVEN in the last 168 hours. ? ?No results found for this or any previous visit (from the past 240 hour(s)).  ? ? ? ? ? ?Radiology Studies: ?No results found. ? ? ? ? ? ?Scheduled Meds: ? apixaban  5 mg Oral BID  ? atorvastatin  80 mg Oral Daily  ? chlorhexidine  15 mL Mouth Rinse BID  ? Chlorhexidine Gluconate Cloth  6 each Topical Q0600  ? feeding supplement  237 mL Oral TID BM  ? fluticasone  2 spray Each Nare Daily  ? mouth rinse  15 mL Mouth Rinse q12n4p  ? multivitamin with minerals  1 tablet Oral Daily  ? polyethylene glycol  17 g Oral Daily  ? senna-docusate  1 tablet Oral BID  ? tamsulosin  0.4 mg Oral QPC supper  ? ?Continuous Infusions: ? ?Assessment & Plan: ?  ?Principal Problem: ?  Acute pulmonary embolism ([place]) ?Active Problems: ?  COPD with acute exacerbation ([place]) ?  Acute on chronic diastolic CHF (congestive heart failure) ([place]) ?  Nodule of upper lobe of right lung ?  Chronic pain syndrome ?  Essential hypertension ?  Dyslipidemia ?  History of renal carcinoma ?  Prostate cancer ([place]) ?  Lupus erythematosus ?  Mediastinal mass ?  Palliative care encounter ?  Obesity, Class III, BMI 40-49.9 (morbid obesity) ([place]) ?  Small cell lung cancer, left upper lobe ([place]) ?  Chronic kidney disease, stage 3a ([place]) ?  Atelectasis of left lung ?  Acute urinary retention ?  Malnutrition of moderate degree ? ? ?Acute hypoxemic respiratory failure. ?COPD with acute exacerbation. ?Acute on chronic diastolic congestive heart failure. ?Acute pulmonary emboli ?Small cell lung cancer. ?Left lung atelectasis ?Refused radiation oncology ?Opted for comfort and hospice facility ?  ?Dysphagia. ?On dysphagia diet ?  ?  ?Urinary retention. ?Constipation. ?improved ?  ?Chronic kidney disease stage IIIa. ?Acute kidney injury ruled out. ?History of renal cancer status post nephrostomy. ?On comfort measures. ? ? ?Prostate cancer. ?Follow-up with PCP as  outpatient. ? ?SLE. ?Follow-up with PCP as outpatient ?  ?Morbid obesity. ?Patient has BMI 39.02 with associated congestive heart failure ? ? ?DVT prophylaxis: eliquis ?Code Status:dnr ?Family Communication: wife at bedside ?Disposition Plan: dc in am to hospice facility ?Status is: Inpatient ?Remains inpatient appropriate because: iv treatment. Hospice house pending ?  ? ? ? ? ? LOS: 18 days  ? ?Time spent: 36 min  ? ? ? ?Nolberto Hanlon, MD ?[facility] ?Pager [phone] ? ?If 7PM-7AM, please contact night-coverage ?03/27/2022, 4:13 PM   ?

## 2022-03-27 NOTE — Progress Notes (Signed)
Nutrition Brief Note  Chart reviewed. Pt now transitioning to comfort care.  No further nutrition interventions planned at this time.  Please re-consult as needed.   Tarica Harl W, RD, LDN, CDCES Registered Dietitian II Certified Diabetes Care and Education Specialist Please refer to [facility]ION for RD and/or RD on-call/weekend/after hours pager   

## 2022-03-28 ENCOUNTER — Ambulatory Visit: Payer: Medicare Other

## 2022-03-28 NOTE — Discharge Summary (Signed)
Edwin Donovan. IOX:[id number] DOB: 09/22/1946 DOA: 03/09/2022 ? ?PCP: System, Provider Not In ? ?Admit date: 03/09/2022 ?Discharge date: 03/28/2022 ? ?Admitted From: Home ?Disposition: Hospice facility ? ? ? ?Discharge Condition:Stable ?CODE STATUS:DNR ?Diet recommendation: regular ? ? ?Brief/Interim Summary: ?Edwin Donovan. is a 76 y.o. male with medical history significant for COPD, CHF, hypertension and CKD, who presented to the ER with acute onset of worsening lower extremity edema with associated orthopnea and dyspnea worsening on exertion over the last couple weeks.  He admitted to worsening cough as well as wheezing.   ?Chest CTA  - two isolated subsegmental pulmonary emboli at the bifurcation in the right upper lobe pulmonary artery. Overall clot burden is very small.    ?17 mm spiculated nodule in the posterior right upper lobe.  Associated 10.0 x 7.4 cm left mediastinal mass extending into the medial left upper lobe and left perihilar region. This appearance raises concern for primary bronchogenic neoplasm such as small cell lung cancer. ?Patient developed acute respiratory failure requiring BiPAP and high flow oxygen.Oncology and PCCm was consulted. patient is supposed to start radiation therapy.  Now he has opted comfort care and will be transferred to hospice house. ? ? ? ?Discharge Diagnoses:  ?Principal Problem: ?  Acute pulmonary embolism ([place]) ?Active Problems: ?  COPD with acute exacerbation ([place]) ?  Acute on chronic diastolic CHF (congestive heart failure) ([place]) ?  Nodule of upper lobe of right lung ?  Chronic pain syndrome ?  Essential hypertension ?  Dyslipidemia ?  History of renal carcinoma ?  Prostate cancer ([place]) ?  Lupus erythematosus ?  Mediastinal mass ?  Palliative care encounter ?  Obesity, Class III, BMI 40-49.9 (morbid obesity) ([place]) ?  Small cell lung cancer, left upper lobe ([place]) ?  Chronic kidney disease, stage 3a ([place]) ?  Atelectasis of left lung ?  Acute urinary  retention ?  Malnutrition of moderate degree ? ?Acute hypoxemic respiratory failure. ?COPD with acute exacerbation. ?Acute on chronic diastolic congestive heart failure. ?Acute pulmonary emboli ?Small cell lung cancer. ?Left lung atelectasis ?Refused radiation oncology ?Opted for comfort and hospice facility ?  ?Dysphagia. ?SPL consulted:Regular;Thin liquid (w/ meats, foods cut well; moistened foods) ?Liquids provided via: Cup;Straw (monitor) ?Medication Administration: Crushed with puree (for ease) ?Supervision: Patient able to self feed;Staff to assist with self feeding (support; rest breaks) ?  ?  ?Urinary retention. ?Constipation. ?improved ?  ?Chronic kidney disease stage IIIa. ?Acute kidney injury ruled out. ?History of renal cancer status post nephrostomy. ?On comfort measures. ? ? ?Prostate cancer. ?Follow-up with PCP as outpatient. ? ?SLE. ?Now comfort ?  ?Morbid obesity. ?Patient has BMI 39.02  ?  ? ?Discharge Instructions ? ?Discharge Instructions   ? ? Diet - low sodium heart healthy   Complete by: As directed ?  ? Increase activity slowly   Complete by: As directed ?  ? ?  ? ?Allergies as of 03/28/2022   ?No Known Allergies ?  ? ?  ?Medication List  ?  ? ?STOP taking these medications   ? ?furosemide 40 MG tablet ?Commonly known as: LASIX ?  ?lisinopril 40 MG tablet ?Commonly known as: ZESTRIL ?  ?metoprolol succinate 100 MG 24 hr tablet ?Commonly known as: TOPROL-XL ?  ? ?  ? ?TAKE these medications   ? ?albuterol 108 (90 Base) MCG/ACT inhaler ?Commonly known as: VENTOLIN HFA ?Inhale 1-2 puffs into the lungs every 6 (six) hours as needed for wheezing or shortness of breath. ?  ?  apixaban 5 MG Tabs tablet ?Commonly known as: ELIQUIS ?2 tablets  by mouth twice a day for 11 doses, then 1 tablet by mouth twice a day ?  ?atorvastatin 80 MG tablet ?Commonly known as: LIPITOR ?TAKE ONE-HALF TABLET BY MOUTH AT BEDTIME FOR CHOLESTEROL ?  ?benzonatate 200 MG capsule ?Commonly known as: TESSALON ?Take 1 capsule  (200 mg total) by mouth 3 (three) times daily. ?  ?bisacodyl 5 MG EC tablet ?Commonly known as: DULCOLAX ?Take 1 tablet (5 mg total) by mouth daily as needed for moderate constipation. ?  ?budesonide-formoterol 160-4.5 MCG/ACT inhaler ?Commonly known as: SYMBICORT ?Inhale 2 puffs into the lungs 2 (two) times daily. ?  ?cetirizine 10 MG tablet ?Commonly known as: ZYRTEC ?Take 1 tablet by mouth daily. ?  ?polyethylene glycol powder 17 GM/SCOOP powder ?Commonly known as: GLYCOLAX/MIRALAX ?MIX 17GM (1 CAPFUL) BY MOUTH EVERY DAY AS NEEDED MIX 17 GRAMS (USE BOTTLE CAP) IN LIQUID OF CHOICE AS DIRECTED FOR CONSTIPATION ?  ?predniSONE 10 MG tablet ?Commonly known as: DELTASONE ?2 tabs daily for 2 days, then 1 tab daily for 3 days ?  ?senna-docusate 8.6-50 MG tablet ?Commonly known as: Senokot-S ?Take 1 tablet by mouth 2 (two) times daily. ?  ?traZODone 50 MG tablet ?Commonly known as: DESYREL ?Take 1 tablet by mouth at bedtime as needed. ?  ? ?  ? ?  ?  ? ? ?  ?Durable Medical Equipment  ?(From admission, onward)  ?  ? ? ?  ? ?  Start     Ordered  ? 03/19/22 1434  For home use only DME Bipap  Once       ?Question Answer Comment  ?Length of Need Lifetime   ?Bleed in oxygen (LPM) 5   ?Keep 02 saturation >88%   ?Inspiratory pressure 18   ?Expiratory pressure 5   ?  ? 03/19/22 1434  ? ?  ?  ? ?  ? ? Contact information for after-discharge care   ? ? Destination   ? ? [facility] .   ?Service: Skilled Nursing ?Contact information: ?[address] ?[place] ?[phone] ? ?  ?  ? ?  ?  ? ?  ?  ? ?  ? ?No Known Allergies ? ?Consultations: ?Pccm, oncology ? ? ?Procedures/Studies: ?CT ABDOMEN PELVIS WO CONTRAST ? ?Result Date: 03/19/2022 ?CLINICAL DATA:  Small cell lung cancer, staging EXAM: CT ABDOMEN AND PELVIS WITHOUT CONTRAST TECHNIQUE: Multidetector CT imaging of the abdomen and pelvis was performed following the standard protocol without IV contrast. Unenhanced CT was  performed per clinician order. Lack of IV contrast limits sensitivity and specificity, especially for evaluation of abdominal/pelvic solid viscera. RADIATION DOSE REDUCTION: This exam was performed according to the departmental dose-optimization program which includes automated exposure control, adjustment of the mA and/or kV according to patient size and/or use of iterative reconstruction technique. COMPARISON:  03/17/2022 FINDINGS: Lower chest: Infiltrative left lung mass involving the left hilum again noted, unchanged since recent chest CT, trace residual left pleural effusion. Postobstructive changes are again seen within the left lower lobe. Dependent hypoventilatory changes are seen at the right lung base. Hepatobiliary: Unremarkable unenhanced appearance of the liver and gallbladder. Pancreas: Unremarkable unenhanced appearance. Spleen: Unremarkable unenhanced appearance. Adrenals/Urinary Tract: Postsurgical changes from right nephrectomy. Stable simple and hyperdense left renal cysts. No specific imaging follow-up recommended. No left-sided urinary tract calculi or obstructive uropathy. The bladder is moderately distended without gross abnormality. The adrenals are normal. Stomach/Bowel:  No bowel obstruction or ileus. Scattered diverticulosis of the descending and sigmoid colon without evidence of acute diverticulitis. Normal appendix right lower quadrant. No bowel wall thickening or inflammatory change. Vascular/Lymphatic: Infrarenal abdominal aortic aneurysm measuring up to 3.8 cm just proximal to the bifurcation, previously measuring approximately 3.3 cm. Evaluation of the vascular lumen is limited without IV contrast. There is diffuse atherosclerosis. No pathologic adenopathy within the abdomen or pelvis. Reproductive: The prostate is surgically absent. Other: No free fluid or free intraperitoneal gas. No abdominal wall hernia. Musculoskeletal: There are no acute or destructive bony lesions. Punctate  areas of sclerosis within the bony pelvis consistent with bone islands, and unchanged since 2020. Chronic nonunion left posterior tenth rib fracture. Reconstructed images demonstrate no additional findings. IMPRESSI

## 2022-03-28 NOTE — TOC Transition Note (Signed)
Transition of Care (TOC) - CM/SW Discharge Note ? ? ?Patient Details  ?Name: Edwin Donovan. ?MRN: [id number] ?Date of Birth: 1946-02-11 ? ?Transition of Care (TOC) CM/SW Contact:  ?Laurena Slimmer, RN ?Phone Number: ?03/28/2022, 9:01 AM ? ? ?Clinical Narrative:    ?Patient accepted by hospice per Daphene Calamity, [facility] SW. Discharge order for hospice received. [facility] rep will arrange EMS. TOC signing off.  ? ? ?  ?Barriers to Discharge: Continued Medical Work up ? ? ?Patient Goals and CMS Choice ?  ?CMS Medicare.gov Compare Post Acute Care list provided to:: Patient ?  ? ?Discharge Placement ?  ?           ?  ?  ?  ?  ? ?Discharge Plan and Services ?  ?  ?Post Acute Care Choice: [place]          ?  ?  ?  ?  ?  ?  ?  ?  ?  ?  ? ?Social Determinants of Health (SDOH) Interventions ?  ? ? ?Readmission Risk Interventions ? ?  03/13/2022  ?  3:42 PM  ?Readmission Risk Prevention Plan  ?Transportation Screening Complete  ?PCP or Specialist Appt within 3-5 Days Complete  ?[place] or Home Care Consult Complete  ?Social Work Consult for [place] Planning/Counseling Complete  ?Palliative Care Screening Not Applicable  ?Medication Review [employer] photographer) Complete  ? ? ? ? ? ?

## 2022-03-28 NOTE — Progress Notes (Signed)
[facility])  ? ?Consent forms have been completed. ? ?EMS notified of patient D/C and transport arranged for 2p pickup. TOC/[place] and Attending Physician/Dr. Kurtis Bushman also notified of transport arrangement.  ?  ?Please send signed DNR form with patient and RN call report to [phone].  ?  ?Daphene Calamity, MSW ?[place] ?[phone] ? ?

## 2022-03-28 NOTE — Care Management Important Message (Signed)
Important Message ? ?Patient Details  ?Name: Edwin Donovan. ?MRN: [id number] ?Date of Birth: 1946/05/14 ? ? ?Medicare Important Message Given:  Other (see comment) ? ?Discharging to hospice home.  Medicare IM withheld at this time out of respect for patient.  ? ? ?[name] ?03/28/2022, 1:55 PM ?

## 2022-03-28 NOTE — Progress Notes (Signed)
? ?  ?Palliative Medicine ?[place] [facility] ?Telephone:([phone]) [phone] Fax:([phone]) [phone] ? ? ?Name: Roxie Gueye. ?Date: 03/28/2022 ?MRN: [id number]  ?DOB: October 18, 1946 ? ?Patient Care Team: ?System, Provider Not In as PCP - General ?Telford Nab, RN as Oncology Nurse Navigator  ? ? ?REASON FOR CONSULTATION: ?Jodi Kappes. is a 76 y.o. male with multiple medical problems including diastolic dysfunction with history of CHF, hypertension, CKD, and COPD on 3 L O2 for the past several weeks, history of prostate and urothelial cancer status post surgery, who was admitted to the hospital 03/09/2022 for management of acute PE after presenting with shortness of breath.  CTA of the chest showed 2 small subsegmental PEs the patient also found to have a 10 cm left mediastinal mass concerning for primary bronchogenic cancer.  Biopsy positive for small cell lung cancer.  Palliative care was consulted to help address goals..  ? ?CODE STATUS: DNR ? ?PAST MEDICAL HISTORY: ?Past Medical History:  ?Diagnosis Date  ? Anxiety   ? ptsd  ? Arthritis   ? Cancer [facility])   ? PROSTATE  ? CHF (congestive heart failure) ([place])   ? Chronic kidney disease   ? stones  ? COPD (chronic obstructive pulmonary disease) ([place])   ? Dysrhythmia   ? History of kidney stones   ? Hypertension   ? Kidney stone   ? Pneumonia   ? 06/27/16  ? Shortness of breath dyspnea   ? ? ?PAST SURGICAL HISTORY:  ?Past Surgical History:  ?Procedure Laterality Date  ? CYSTOSCOPY W/ RETROGRADES Bilateral 12/26/2017  ? Procedure: CYSTOSCOPY WITH RETROGRADE PYELOGRAM;  Surgeon: Abbie Sons, MD;  Location: [facility];  Service: Urology;  Laterality: Bilateral;  ? CYSTOSCOPY W/ RETROGRADES Right 02/09/2019  ? Procedure: CYSTOSCOPY WITH RETROGRADE PYELOGRAM;  Surgeon: Abbie Sons, MD;  Location: [facility];  Service: Urology;  Laterality: Right;  ? CYSTOSCOPY W/ URETERAL STENT PLACEMENT Right 07/29/2016  ? Procedure: CYSTOSCOPY WITH STENT  REPLACEMENT;  Surgeon: Hollice Espy, MD;  Location: [facility];  Service: Urology;  Laterality: Right;  ? CYSTOSCOPY WITH BIOPSY Right 07/29/2016  ? Procedure: CYSTOSCOPY WITH BIOPSY;  Surgeon: Hollice Espy, MD;  Location: [facility];  Service: Urology;  Laterality: Right;  ? CYSTOSCOPY WITH BIOPSY Right 02/09/2019  ? Procedure: CYSTOSCOPY WITH BIOPSY;  Surgeon: Abbie Sons, MD;  Location: [facility];  Service: Urology;  Laterality: Right;  ? CYSTOSCOPY WITH STENT PLACEMENT Right 06/28/2016  ? Procedure: CYSTOSCOPY WITH STENT PLACEMENT;  Surgeon: Hollice Espy, MD;  Location: [facility];  Service: Urology;  Laterality: Right;  ? CYSTOSCOPY WITH STENT PLACEMENT Right 02/09/2019  ? Procedure: CYSTOSCOPY WITH STENT PLACEMENT;  Surgeon: Abbie Sons, MD;  Location: [facility];  Service: Urology;  Laterality: Right;  ? CYSTOSCOPY/URETEROSCOPY/HOLMIUM LASER/STENT PLACEMENT Right 12/26/2017  ? Procedure: CYSTOSCOPY/URETEROSCOPY/HOLMIUM LASER/STENT PLACEMENT;  Surgeon: Abbie Sons, MD;  Location: [facility];  Service: Urology;  Laterality: Right;  ? EXTRACORPOREAL SHOCK WAVE LITHOTRIPSY Right 12/04/2017  ? Procedure: EXTRACORPOREAL SHOCK WAVE LITHOTRIPSY (ESWL);  Surgeon: Hollice Espy, MD;  Location: [facility];  Service: Urology;  Laterality: Right;  ? PROSTATECTOMY    ? URETEROSCOPY Right 07/29/2016  ? Procedure: URETEROSCOPY;  Surgeon: Hollice Espy, MD;  Location: [facility];  Service: Urology;  Laterality: Right;  ? URETEROSCOPY Right 02/09/2019  ? Procedure: URETEROSCOPY- DIAGNOSTIC;  Surgeon: Abbie Sons, MD;  Location: [facility];  Service: Urology;  Laterality: Right;  ? VIDEO BRONCHOSCOPY WITH ENDOBRONCHIAL NAVIGATION Left  03/15/2022  ? Procedure: VIDEO BRONCHOSCOPY WITH ENDOBRONCHIAL NAVIGATION;  Surgeon: Ottie Glazier, MD;  Location: [facility];  Service: Thoracic;  Laterality: Left;  ? ? ?HEMATOLOGY/ONCOLOGY HISTORY:  ?Oncology History  ?Urothelial carcinoma ([place])  ?02/28/2019 Initial Diagnosis  ? Urothelial  carcinoma ([place]) ?  ?03/12/2019 -  Chemotherapy  ? The patient had palonosetron (ALOXI) injection 0.25 mg, 0.25 mg, Intravenous,  Once, 0 of 4 cycles ?CISplatin (PLATINOL) 176 mg in sodium chloride 0.9 % 500 mL chemo infusion, 70 mg/m2 = 176 mg, Intravenous,  Once, 0 of 4 cycles ?gemcitabine (GEMZAR) 2,508 mg in sodium chloride 0.9 % 250 mL chemo infusion, 1,000 mg/m2 = 2,508 mg, Intravenous,  Once, 0 of 4 cycles ?fosaprepitant (EMEND) 150 mg, dexamethasone (DECADRON) 12 mg in sodium chloride 0.9 % 145 mL IVPB, , Intravenous,  Once, 0 of 4 cycles ? for chemotherapy treatment.  ?  ? ? ?ALLERGIES:  has No Known Allergies. ? ?MEDICATIONS:  ?Current Facility-Administered Medications  ?Medication Dose Route Frequency Provider Last Rate Last Admin  ? acetaminophen (TYLENOL) tablet 650 mg  650 mg Oral Q6H PRN Mansy, Jan A, MD   650 mg at 03/27/22 1403  ? Or  ? acetaminophen (TYLENOL) suppository 650 mg  650 mg Rectal Q6H PRN Mansy, Jan A, MD      ? albuterol (PROVENTIL) (2.5 MG/3ML) 0.083% nebulizer solution 3 mL  3 mL Inhalation Q4H PRN Nicole Kindred A, DO   3 mL at 03/24/22 2224  ? apixaban (ELIQUIS) tablet 5 mg  5 mg Oral BID Wynelle Cleveland, RPH   5 mg at 03/27/22 2114  ? atorvastatin (LIPITOR) tablet 80 mg  80 mg Oral Daily Mansy, Jan A, MD   80 mg at 03/26/22 0946  ? bisacodyl (DULCOLAX) EC tablet 5 mg  5 mg Oral Daily PRN Nicole Kindred A, DO   5 mg at 03/14/22 1001  ? chlorhexidine (PERIDEX) 0.12 % solution 15 mL  15 mL Mouth Rinse BID Gwynne Edinger, MD   15 mL at 03/27/22 2114  ? feeding supplement (ENSURE ENLIVE / ENSURE PLUS) liquid 237 mL  237 mL Oral TID BM Sharen Hones, MD   237 mL at 03/27/22 1013  ? fluticasone (FLONASE) 50 MCG/ACT nasal spray 2 spray  2 spray Each Nare Daily Sharen Hones, MD   2 spray at 03/26/22 [phone]  ? Glycerin (Adult) 2 g suppository 1 suppository  1 suppository Rectal Daily PRN Gwynne Edinger, MD   1 suppository at 03/18/22 1225  ? ipratropium-albuterol (DUONEB) 0.5-2.5  (3) MG/3ML nebulizer solution 3 mL  3 mL Nebulization Q6H PRN Ottie Glazier, MD   3 mL at 03/17/22 2127  ? MEDLINE mouth rinse  15 mL Mouth Rinse q12n4p Wouk, Ailene Rud, MD   15 mL at 03/26/22 1230  ? morphine (PF) 2 MG/ML injection 1 mg  1 mg Intravenous Q3H PRN Nolberto Hanlon, MD   1 mg at 03/28/22 0930  ? multivitamin with minerals tablet 1 tablet  1 tablet Oral Daily Sharen Hones, MD   1 tablet at 03/26/22 0946  ? polyethylene glycol (MIRALAX / GLYCOLAX) packet 17 g  17 g Oral Daily Nicole Kindred A, DO   17 g at 03/25/22 4034  ? senna-docusate (Senokot-S) tablet 1 tablet  1 tablet Oral BID Nicole Kindred A, DO   1 tablet at 03/27/22 2114  ? sodium chloride (OCEAN) 0.65 % nasal spray 1 spray  1 spray Each Nare PRN Sharen Hones, MD      ?  sodium chloride flush (NS) 0.9 % injection 3 mL  3 mL Intravenous Q12H Nolberto Hanlon, MD   3 mL at 03/27/22 2053  ? sodium phosphate (FLEET) 7-19 GM/118ML enema 1 enema  1 enema Rectal Daily PRN Gwynne Edinger, MD   1 enema at 03/16/22 0439  ? tamsulosin (FLOMAX) capsule 0.4 mg  0.4 mg Oral QPC supper Gwynne Edinger, MD   0.4 mg at 03/26/22 1710  ? traZODone (DESYREL) tablet 200 mg  200 mg Oral QHS PRN Gwynne Edinger, MD   200 mg at 03/27/22 2356  ? ? ?VITAL SIGNS: ?BP (!) 94/54 (BP Location: Right Arm)   Pulse (!) 114   Temp 98 ?F (36.7 ?C) (Oral)   Resp 18   Ht 5\' 11"  (1.803 m)   Wt 284 lb 6.3 oz (129 kg)   SpO2 97%   BMI 39.66 kg/m?  ?Filed Weights  ? 03/21/22 0500 03/22/22 0500 03/26/22 0400  ?Weight: 280 lb 6.8 oz (127.2 kg) 279 lb 12.2 oz (126.9 kg) 284 lb 6.3 oz (129 kg)  ?  ?Estimated body mass index is 39.66 kg/m? as calculated from the following: ?  Height as of this encounter: 5\' 11"  (1.803 m). ?  Weight as of this encounter: 284 lb 6.3 oz (129 kg). ? ?LABS: ?CBC: ?   ?Component Value Date/Time  ? WBC 9.8 03/27/2022 0536  ? HGB 8.8 (L) 03/27/2022 0536  ? HCT 27.9 (L) 03/27/2022 0536  ? PLT 146 (L) 03/27/2022 0536  ? MCV 98.6 03/27/2022 0536  ?  NEUTROABS 6.4 02/27/2022 0023  ? LYMPHSABS 0.8 02/27/2022 0023  ? MONOABS 0.5 02/27/2022 0023  ? EOSABS 0.2 02/27/2022 0023  ? BASOSABS 0.0 02/27/2022 0023  ? ?Comprehensive Metabolic Panel: ?   ?Component Value Da

## 2022-03-29 ENCOUNTER — Encounter: Payer: Self-pay | Admitting: *Deleted

## 2022-03-29 ENCOUNTER — Ambulatory Visit: Payer: Medicare Other

## 2022-03-31 DIAGNOSIS — C689 Malignant neoplasm of urinary organ, unspecified: Secondary | ICD-10-CM | POA: Diagnosis not present

## 2022-03-31 DIAGNOSIS — I10 Essential (primary) hypertension: Secondary | ICD-10-CM | POA: Diagnosis not present

## 2022-03-31 DIAGNOSIS — I509 Heart failure, unspecified: Secondary | ICD-10-CM | POA: Diagnosis not present

## 2022-03-31 DIAGNOSIS — J449 Chronic obstructive pulmonary disease, unspecified: Secondary | ICD-10-CM | POA: Diagnosis not present

## 2022-04-01 ENCOUNTER — Ambulatory Visit: Payer: Medicare Other

## 2022-04-02 ENCOUNTER — Ambulatory Visit: Payer: Medicare Other

## 2022-04-03 ENCOUNTER — Ambulatory Visit: Payer: Medicare Other

## 2022-04-04 ENCOUNTER — Ambulatory Visit: Payer: Medicare Other

## 2022-04-05 ENCOUNTER — Ambulatory Visit: Payer: Medicare Other

## 2022-04-08 ENCOUNTER — Ambulatory Visit: Payer: Medicare Other

## 2022-04-09 ENCOUNTER — Ambulatory Visit: Payer: Medicare Other

## 2022-04-10 ENCOUNTER — Ambulatory Visit: Payer: Medicare Other

## 2022-04-11 ENCOUNTER — Ambulatory Visit: Payer: Medicare Other

## 2022-04-12 ENCOUNTER — Ambulatory Visit: Payer: Medicare Other

## 2022-04-16 ENCOUNTER — Ambulatory Visit: Payer: Medicare Other

## 2022-04-17 ENCOUNTER — Ambulatory Visit: Payer: Medicare Other

## 2022-04-18 ENCOUNTER — Ambulatory Visit: Payer: Medicare Other

## 2022-04-18 DEATH — deceased

## 2022-04-19 ENCOUNTER — Ambulatory Visit: Payer: Medicare Other

## 2022-04-22 ENCOUNTER — Ambulatory Visit: Payer: Medicare Other

## 2022-04-23 ENCOUNTER — Ambulatory Visit: Payer: Medicare Other
